# Patient Record
Sex: Female | Born: 1960 | Race: White | Hispanic: No | Smoking: Never smoker
Health system: Southern US, Community
[De-identification: ages and names within clinical notes are randomized; demographics above are authoritative.]

## PROBLEM LIST (undated history)

## (undated) DIAGNOSIS — N390 Urinary tract infection, site not specified: Secondary | ICD-10-CM

## (undated) DIAGNOSIS — I10 Essential (primary) hypertension: Secondary | ICD-10-CM

## (undated) DIAGNOSIS — A419 Sepsis, unspecified organism: Secondary | ICD-10-CM

## (undated) DIAGNOSIS — R002 Palpitations: Secondary | ICD-10-CM

## (undated) DIAGNOSIS — C801 Malignant (primary) neoplasm, unspecified: Secondary | ICD-10-CM

## (undated) DIAGNOSIS — E669 Obesity, unspecified: Secondary | ICD-10-CM

## (undated) DIAGNOSIS — E119 Type 2 diabetes mellitus without complications: Secondary | ICD-10-CM

## (undated) DIAGNOSIS — K625 Hemorrhage of anus and rectum: Secondary | ICD-10-CM

## (undated) DIAGNOSIS — M545 Low back pain: Secondary | ICD-10-CM

## (undated) DIAGNOSIS — E785 Hyperlipidemia, unspecified: Secondary | ICD-10-CM

## (undated) DIAGNOSIS — F32A Depression, unspecified: Secondary | ICD-10-CM

## (undated) DIAGNOSIS — R079 Chest pain, unspecified: Secondary | ICD-10-CM

## (undated) DIAGNOSIS — M75102 Unspecified rotator cuff tear or rupture of left shoulder, not specified as traumatic: Secondary | ICD-10-CM

## (undated) DIAGNOSIS — N200 Calculus of kidney: Secondary | ICD-10-CM

## (undated) DIAGNOSIS — M519 Unspecified thoracic, thoracolumbar and lumbosacral intervertebral disc disorder: Secondary | ICD-10-CM

## (undated) DIAGNOSIS — M79609 Pain in unspecified limb: Secondary | ICD-10-CM

## (undated) DIAGNOSIS — K219 Gastro-esophageal reflux disease without esophagitis: Secondary | ICD-10-CM

## (undated) DIAGNOSIS — K5790 Diverticulosis of intestine, part unspecified, without perforation or abscess without bleeding: Secondary | ICD-10-CM

## (undated) DIAGNOSIS — N3090 Cystitis, unspecified without hematuria: Secondary | ICD-10-CM

## (undated) DIAGNOSIS — M797 Fibromyalgia: Secondary | ICD-10-CM

## (undated) DIAGNOSIS — Z01810 Encounter for preprocedural cardiovascular examination: Secondary | ICD-10-CM

## (undated) DIAGNOSIS — F329 Major depressive disorder, single episode, unspecified: Secondary | ICD-10-CM

## (undated) DIAGNOSIS — F419 Anxiety disorder, unspecified: Secondary | ICD-10-CM

## (undated) HISTORY — DX: Essential (primary) hypertension: I10

## (undated) HISTORY — DX: Anxiety disorder, unspecified: F41.9

## (undated) HISTORY — DX: Chest pain, unspecified: R07.9

## (undated) HISTORY — DX: Urinary tract infection, site not specified: N39.0

## (undated) HISTORY — DX: Palpitations: R00.2

## (undated) HISTORY — DX: Unspecified rotator cuff tear or rupture of left shoulder, not specified as traumatic: M75.102

## (undated) HISTORY — DX: Low back pain: M54.5

## (undated) HISTORY — DX: Calculus of kidney: N20.0

## (undated) HISTORY — DX: Diverticulosis of intestine, part unspecified, without perforation or abscess without bleeding: K57.90

## (undated) HISTORY — DX: Pain in unspecified limb: M79.609

## (undated) HISTORY — PX: OTHER SURGICAL HISTORY: SHX169

## (undated) HISTORY — DX: Hemorrhage of anus and rectum: K62.5

## (undated) HISTORY — PX: TOTAL ABDOMINAL HYSTERECTOMY W/ BILATERAL SALPINGOOPHORECTOMY: SHX83

## (undated) HISTORY — PX: ABDOMINAL HYSTERECTOMY: SHX81

## (undated) HISTORY — DX: Hyperlipidemia, unspecified: E78.5

## (undated) HISTORY — PX: FOOT SURGERY: SHX648

## (undated) HISTORY — DX: Type 2 diabetes mellitus without complications: E11.9

## (undated) HISTORY — DX: Fibromyalgia: M79.7

## (undated) HISTORY — DX: Unspecified thoracic, thoracolumbar and lumbosacral intervertebral disc disorder: M51.9

## (undated) HISTORY — DX: Cystitis, unspecified without hematuria: N30.90

## (undated) HISTORY — DX: Obesity, unspecified: E66.9

## (undated) HISTORY — DX: Gastro-esophageal reflux disease without esophagitis: K21.9

## (undated) HISTORY — DX: Sepsis, unspecified organism: A41.9

## (undated) HISTORY — DX: Depression, unspecified: F32.A

## (undated) HISTORY — DX: Encounter for preprocedural cardiovascular examination: Z01.810

## (undated) HISTORY — DX: Major depressive disorder, single episode, unspecified: F32.9

---

## 1999-04-08 ENCOUNTER — Encounter: Admission: RE | Admit: 1999-04-08 | Discharge: 1999-04-15 | Payer: Self-pay | Admitting: Family Medicine

## 1999-05-16 ENCOUNTER — Ambulatory Visit (HOSPITAL_COMMUNITY): Admission: RE | Admit: 1999-05-16 | Discharge: 1999-05-16 | Payer: Self-pay | Admitting: Neurology

## 1999-05-16 ENCOUNTER — Encounter: Payer: Self-pay | Admitting: Neurology

## 1999-08-14 ENCOUNTER — Encounter: Admission: RE | Admit: 1999-08-14 | Discharge: 1999-08-30 | Payer: Self-pay | Admitting: Orthopedic Surgery

## 1999-10-29 ENCOUNTER — Encounter: Payer: Self-pay | Admitting: Sports Medicine

## 1999-10-29 ENCOUNTER — Ambulatory Visit (HOSPITAL_COMMUNITY): Admission: RE | Admit: 1999-10-29 | Discharge: 1999-10-29 | Payer: Self-pay | Admitting: Sports Medicine

## 2002-12-20 ENCOUNTER — Other Ambulatory Visit: Admission: RE | Admit: 2002-12-20 | Discharge: 2002-12-20 | Payer: Self-pay | Admitting: Family Medicine

## 2003-05-25 ENCOUNTER — Ambulatory Visit (HOSPITAL_COMMUNITY): Admission: RE | Admit: 2003-05-25 | Discharge: 2003-05-25 | Payer: Self-pay | Admitting: Neurosurgery

## 2004-10-10 ENCOUNTER — Ambulatory Visit: Payer: Self-pay | Admitting: Cardiology

## 2005-09-16 ENCOUNTER — Other Ambulatory Visit: Admission: RE | Admit: 2005-09-16 | Discharge: 2005-09-16 | Payer: Self-pay | Admitting: Family Medicine

## 2007-10-21 HISTORY — PX: ROTATOR CUFF REPAIR: SHX139

## 2010-01-17 ENCOUNTER — Encounter
Admission: RE | Admit: 2010-01-17 | Discharge: 2010-01-22 | Payer: Self-pay | Admitting: Physical Medicine & Rehabilitation

## 2010-01-22 ENCOUNTER — Ambulatory Visit: Payer: Self-pay | Admitting: Physical Medicine & Rehabilitation

## 2010-11-10 ENCOUNTER — Encounter: Payer: Self-pay | Admitting: Physical Medicine & Rehabilitation

## 2011-01-04 DIAGNOSIS — C539 Malignant neoplasm of cervix uteri, unspecified: Secondary | ICD-10-CM

## 2011-01-04 DIAGNOSIS — I10 Essential (primary) hypertension: Secondary | ICD-10-CM

## 2011-01-04 DIAGNOSIS — E1169 Type 2 diabetes mellitus with other specified complication: Secondary | ICD-10-CM | POA: Insufficient documentation

## 2011-01-04 DIAGNOSIS — G8929 Other chronic pain: Secondary | ICD-10-CM | POA: Insufficient documentation

## 2011-01-04 DIAGNOSIS — R002 Palpitations: Secondary | ICD-10-CM | POA: Insufficient documentation

## 2011-01-04 DIAGNOSIS — K5750 Diverticulosis of both small and large intestine without perforation or abscess without bleeding: Secondary | ICD-10-CM | POA: Insufficient documentation

## 2011-01-04 DIAGNOSIS — E785 Hyperlipidemia, unspecified: Secondary | ICD-10-CM

## 2011-01-04 DIAGNOSIS — E119 Type 2 diabetes mellitus without complications: Secondary | ICD-10-CM | POA: Insufficient documentation

## 2011-01-04 DIAGNOSIS — G2581 Restless legs syndrome: Secondary | ICD-10-CM | POA: Insufficient documentation

## 2011-01-04 HISTORY — DX: Malignant neoplasm of cervix uteri, unspecified: C53.9

## 2011-01-04 HISTORY — DX: Essential (primary) hypertension: I10

## 2011-02-06 ENCOUNTER — Encounter: Payer: Self-pay | Admitting: Physician Assistant

## 2011-02-11 ENCOUNTER — Encounter: Payer: Self-pay | Admitting: Physician Assistant

## 2011-02-12 ENCOUNTER — Ambulatory Visit (INDEPENDENT_AMBULATORY_CARE_PROVIDER_SITE_OTHER): Payer: Managed Care, Other (non HMO) | Admitting: Physician Assistant

## 2011-02-12 ENCOUNTER — Ambulatory Visit: Payer: Self-pay | Admitting: Physician Assistant

## 2011-02-12 ENCOUNTER — Other Ambulatory Visit: Payer: Self-pay | Admitting: Internal Medicine

## 2011-02-12 ENCOUNTER — Encounter: Payer: Self-pay | Admitting: Internal Medicine

## 2011-02-12 ENCOUNTER — Encounter: Payer: Self-pay | Admitting: Physician Assistant

## 2011-02-12 ENCOUNTER — Telehealth: Payer: Self-pay | Admitting: Physician Assistant

## 2011-02-12 VITALS — BP 136/88 | HR 113 | Ht 64.0 in | Wt 225.0 lb

## 2011-02-12 DIAGNOSIS — R079 Chest pain, unspecified: Secondary | ICD-10-CM

## 2011-02-12 DIAGNOSIS — R55 Syncope and collapse: Secondary | ICD-10-CM

## 2011-02-12 DIAGNOSIS — Z01810 Encounter for preprocedural cardiovascular examination: Secondary | ICD-10-CM

## 2011-02-12 HISTORY — DX: Encounter for preprocedural cardiovascular examination: Z01.810

## 2011-02-12 HISTORY — DX: Chest pain, unspecified: R07.9

## 2011-02-12 LAB — BASIC METABOLIC PANEL
BUN: 23 mg/dL (ref 6–23)
CO2: 29 mEq/L (ref 19–32)
Calcium: 10.3 mg/dL (ref 8.4–10.5)
Glucose, Bld: 155 mg/dL — ABNORMAL HIGH (ref 70–99)
Sodium: 144 mEq/L (ref 135–145)

## 2011-02-12 MED ORDER — FAMOTIDINE 20 MG PO TABS: 20.0000 mg | ORAL_TABLET | Freq: Two times a day (BID) | ORAL | Status: DC

## 2011-02-12 NOTE — Assessment & Plan Note (Signed)
Atypical and typical features.  Schedule Myoview as noted above.  She has some symptoms of dysphagia as well.  I suspect her symptoms may be from GERD or possibly asthma.  She can take Pepcid 20 mg BID and follow up with her PCP for further evaluation.  She is fairly sedentary.  She has had some episodes of syncope.  Her HR is high today as well.  She will have a DDimer drawn today as I think she is at risk for pulmonary embolism.  She will need a CT if this is abnormal.

## 2011-02-12 NOTE — Assessment & Plan Note (Signed)
She needs a urologic procedure done sometime in the near future.  I reviewed her ECG from her PCPs office as well as her ECG here and her prior ECGs.  There does not seem to be a significant change over time.  However, she does have symptoms of chest pain.  Prior to clearing her for surgery, she will need Stress testing.  She will be set up for a [employer] study.  If this is normal, she will require no further testing.

## 2011-02-12 NOTE — Telephone Encounter (Signed)
DDimer negative. Not sure who results went to . . . I cannot access it to comment. Please notify patient. Tereso Newcomer, PA-C

## 2011-02-12 NOTE — Progress Notes (Signed)
History of Present Illness: Primary Cardiologist:  Dr. Rollene Rotunda  Casey Sanders is a 50 y.o. female with a h/o DM2, HTN, HLP who presents for surgical clearance.  She was evaluated By Dr. Antoine Poche in 2005 dyspnea.  A Myoview study was negative for ischemia.  She had normal LV function.  She had a normal BNP.  Holter monitor demonstrated sinus rhythm sinus tachycardia and PVCs.  She is referred back today for surgical clearance due to an abnormal electrocardiogram.  The patient has significant issues with pain.  She is seen by a pain specialist.  She is not that active and in fact she spends most of the day in her bed.  She is unable to vacuum a room or go up steps or a hill.  She does have chest pain.  This is a left-sided sharp pain.  It comes on at rest.  She notes associated shortness of breath.  She has shortness of breath with any type of activity.  This is a chronic symptom.  She does note some association with meals.  She does note some dysphagia.  She notes a history of syncope.  She apparently passed out several months ago.  This happened again about 2 months ago.  It sounds as though she may have had an asthma exacerbation at the time.  It also sounds as though she had some orthostasis.  She sleeps on her side because of her back pain.  She does occasionally awaken short of breath.  She denies significant pedal edema.  She apparently has a kidney stone.  This is causing an obstruction which is resulting in frequent urinary tract infections.  She is to have a procedure soon with Dr. Annabell Howells.   Past Medical History  Diagnosis Date  . Palpitations     monitor 9/05: NSR, sinus tachy, PVCs  . Chest pain     a. Myoview 9/05: no ischemia, no scar, EF 70%  . DM2 (diabetes mellitus, type 2)   . HLD (hyperlipidemia)   . HTN (hypertension)   . GERD (gastroesophageal reflux disease)     Hiatal Hernia  . Asthma   . Diverticulosis   . Obesity   . Lumbar disc disease   . Anxiety and depression     . Allergic rhinitis   . Nephrolithiasis   . Frequent UTI   . Left rotator cuff tear   . Fibromyalgia     Past Surgical History  Procedure Date  . Total abdominal hysterectomy w/ bilateral salpingoophorectomy   . Cesarean section   . Foot surgery   . Tonsillectomy   . Right wrist surgery     Current Outpatient Prescriptions  Medication Sig Dispense Refill  . albuterol (PROVENTIL HFA;VENTOLIN HFA) 108 (90 BASE) MCG/ACT inhaler Inhale 2 puffs into the lungs. 2puffs q 8 hr      . aspirin 81 MG EC tablet Take 81 mg by mouth daily.        . clonazePAM (KLONOPIN) 2 MG tablet Take 2 mg by mouth 5 (five) times daily.        . cyclobenzaprine (FLEXERIL) 10 MG tablet Take 10 mg by mouth as directed.        Marland Kitchen exenatide (BYETTA) 10 MCG/0.04ML SOLN Inject into the skin 2 (two) times daily with a meal.        . Fenofibrate (LIPOFEN) 150 MG CAPS Take by mouth daily.        . fentaNYL (DURAGESIC - DOSED MCG/HR) 12 MCG/HR Place 1  patch onto the skin every other day.       . fexofenadine (ALLEGRA) 60 MG tablet Take 60 mg by mouth 2 (two) times daily.        . fish oil-omega-3 fatty acids 1000 MG capsule Take 2 g by mouth daily.        . fluticasone (FLONASE) 50 MCG/ACT nasal spray 2 sprays by Nasal route daily. 1 spray each nostril qd       . Fluticasone-Salmeterol (ADVAIR DISKUS) 250-50 MCG/DOSE AEPB Inhale 1 puff into the lungs every 12 (twelve) hours.        . gabapentin (NEURONTIN) 800 MG tablet Take 800 mg by mouth 4 (four) times daily.        Marland Kitchen glimepiride (AMARYL) 2 MG tablet Take 2 mg by mouth daily before breakfast. 1 in am  And  2 at pm       . lisinopril (PRINIVIL,ZESTRIL) 10 MG tablet Take 10 mg by mouth daily.        . Multiple Vitamin (MULTIVITAMIN PO) Take by mouth daily.        . nitrofurantoin (MACRODANTIN) 100 MG capsule Take 100 mg by mouth daily.        . rosuvastatin (CRESTOR) 20 MG tablet Take 20 mg by mouth daily.        Marland Kitchen DISCONTD: DULoxetine (CYMBALTA) 60 MG capsule Take 60  mg by mouth. 2 caps at bedtime       . lidocaine (LIDODERM) 5 % Place 1 patch onto the skin daily. Remove & Discard patch within 12 hours or as directed by MD       . DISCONTD: etodolac (LODINE) 400 MG tablet Take 400 mg by mouth 2 (two) times daily.        Marland Kitchen DISCONTD: fenofibrate micronized (ANTARA) 130 MG capsule Take 130 mg by mouth daily before breakfast.        . DISCONTD: metFORMIN (GLUMETZA) 500 MG (MOD) 24 hr tablet Take 500 mg by mouth 2 (two) times daily after a meal.        . DISCONTD: montelukast (SINGULAIR) 10 MG tablet Take 10 mg by mouth at bedtime.          Allergies  Allergen Reactions  . Codeine Other (See Comments)    unknown  . Niaspan (Niacin (Antihyperlipidemic)) Other (See Comments)    Increase blood glucose    History  Substance Use Topics  . Smoking status: Former Games developer  . Smokeless tobacco: Not on file  . Alcohol Use: No     Family History  Problem Relation Age of Onset  . Coronary artery disease Mother   . Coronary artery disease Father     ROS:  See the history of present illness.  She does have some fever when she has a urinary tract infection.  She does have a cough with her asthma.  She has occasional bright red blood per rectum secondary to hemorrhoids.  She has lost weight recently.  This has been worked up by her PCP.  She denies skin changes.  All other systems reviewed and negative.  Vital Signs: BP 136/88  Pulse 113  Ht 5\' 4"  (1.626 m)  Wt 225 lb (102.059 kg)  BMI 38.62 kg/m2  PHYSICAL EXAM: Well nourished, well developed, in no acute distress HEENT: normal Neck: no JVD Vascular: No carotid bruits Endocrine: No thyromegaly Lymphatics: No cervical adenopathy Cardiac:  normal S1, S2; RRR; no murmur, Distant heart sounds Lungs:  Decreased breath sounds bilaterally, no  wheezing, no rales Abd: soft, nontender, no hepatomegaly Ext: no edema Skin: warm and dry Neuro:  CNs 2-12 intact, no focal abnormalities noted Psych: Flat  affect  EKG:  Sinus tachycardia, Heart rate 113, and rightward axis, low voltage, poor R-wave progression, no ischemic changes.  ASSESSMENT AND PLAN:

## 2011-02-12 NOTE — Progress Notes (Signed)
Addended byTereso Newcomer on: 02/12/2011 02:01 PM   Modules accepted: Level of Service

## 2011-02-12 NOTE — Patient Instructions (Signed)
Your physician has requested that you have a lexiscan myoview 786.50 THIS WILL NEED TO BE A 2 DAY PROTOCOL. For further information please visit [email or web address] Please follow instruction sheet, as given.   Your physician recommends that you return for lab work in: TODAY STAT D-DIMER 786.50, AND A BMET 786.50  Your physician has recommended you make the following change in your medication: START PEPCID 20 MG 1 TAB TWICE DAILY; PLEASE FOLLOW UP WITH YOUR PRIMARY CARE PHYSICIAN IN REFERENCE TO THE CHEST PAIN AND EPIGASTRIC PAIN.

## 2011-02-18 ENCOUNTER — Other Ambulatory Visit (HOSPITAL_COMMUNITY): Payer: 59 | Admitting: Radiology

## 2011-02-19 ENCOUNTER — Ambulatory Visit (HOSPITAL_COMMUNITY): Payer: Managed Care, Other (non HMO) | Attending: Cardiology | Admitting: Radiology

## 2011-02-19 DIAGNOSIS — R079 Chest pain, unspecified: Secondary | ICD-10-CM | POA: Insufficient documentation

## 2011-02-19 DIAGNOSIS — E119 Type 2 diabetes mellitus without complications: Secondary | ICD-10-CM

## 2011-02-19 DIAGNOSIS — R0602 Shortness of breath: Secondary | ICD-10-CM

## 2011-02-19 DIAGNOSIS — Z01810 Encounter for preprocedural cardiovascular examination: Secondary | ICD-10-CM | POA: Insufficient documentation

## 2011-02-19 DIAGNOSIS — R55 Syncope and collapse: Secondary | ICD-10-CM

## 2011-02-19 MED ORDER — REGADENOSON 0.4 MG/5ML IV SOLN
0.4000 mg | Freq: Once | INTRAVENOUS | Status: AC
  Administered 2011-02-19: 0.4 mg via INTRAVENOUS

## 2011-02-19 MED ORDER — TECHNETIUM TC 99M TETROFOSMIN IV KIT
33.0000 | PACK | Freq: Once | INTRAVENOUS | Status: AC | PRN
  Administered 2011-02-19: 33 via INTRAVENOUS

## 2011-02-19 MED ORDER — TECHNETIUM TC 99M TETROFOSMIN IV KIT
10.1000 | PACK | Freq: Once | INTRAVENOUS | Status: AC | PRN
  Administered 2011-02-19: 10.1 via INTRAVENOUS

## 2011-02-19 NOTE — Progress Notes (Addendum)
[facility] SITE 3 NUCLEAR MED [address] [place] [address] [phone]  Cardiology Nuclear Med Study  Casey Sanders is a 50 y.o. female [id number] June 29, 1961   Nuclear Med Background Indication for Stress Test:  Evaluation for Ischemia, Pending Surgical Clearance: Dr. Annabell Howells, and Abnormal EKG History:  Asthma and '05 Myocardial Perfusion Study NL EF 70% Cardiac Risk Factors: Family History - CAD, History of Smoking, Hypertension, Lipids, NIDDM and Obesity  Symptoms:  Chest Pain   Nuclear Pre-Procedure Caffeine/Decaff Intake:  None NPO After: 7:00am   Lungs:  clear IV 0.9% NS with Angio Cath:  22g  IV Site: R Antecubital  IV Started by:  Stanton Kidney, EMT-P  Chest Size (in):  40 Cup Size: DD  Height: 5\' 1"  (1.549 m)  Weight:  224 lb (101.606 kg)  BMI:  Body mass index is 42.32 kg/(m^2). Tech Comments:  CBG=165 @ 7 am today    Nuclear Med Study 1 or 2 day study: 1 day  Stress Test Type:  Eugenie Birks  Reading MD: Marca Ancona, MD  Order Authorizing Provider:  J.Hochrein  Resting Radionuclide: Technetium 42m Tetrofosmin  Resting Radionuclide Dose: 10.1 mCi   Stress Radionuclide:  Technetium 77m Tetrofosmin  Stress Radionuclide Dose: 33 mCi           Stress Protocol Rest HR: 102 Stress HR: 120  Rest BP: 99/56 Stress BP: 102/58  Exercise Time (min): n/a METS: n/a   Predicted Max HR: 169 bpm % Max HR: 71.01 bpm Rate Pressure Product: 57846   Dose of Adenosine (mg):  n/a Dose of Lexiscan: 0.4 mg  Dose of Atropine (mg): n/a Dose of Dobutamine: n/a mcg/kg/min (at max HR)  Stress Test Technologist: Milana Na, EMT-P  Nuclear Technologist:  Domenic Polite, CNMT     Rest Procedure:  Myocardial perfusion imaging was performed at rest 45 minutes following the intravenous administration of Technetium 48m Tetrofosmin. Rest ECG: Sinus Tachycardia  Stress Procedure:  The patient received IV Lexiscan 0.4 mg over 15-seconds.  Technetium 29m Tetrofosmin  injected at 30-seconds.  There were no significant changes with Lexiscan.  Quantitative spect images were obtained after a 45 minute delay. Stress ECG: No significant change from baseline ECG  QPS Raw Data Images:  Normal; no motion artifact; normal heart/lung ratio. Stress Images:  Normal homogeneous uptake in all areas of the myocardium. Rest Images:  Normal homogeneous uptake in all areas of the myocardium. Subtraction (SDS):  No evidence of ischemia. Transient Ischemic Dilatation (Normal <1.22):  1.06 Lung/Heart Ratio (Normal <0.45):  .43  Quantitative Gated Spect Images QGS EDV:  56 ml QGS ESV:  18 ml QGS cine images:  NL LV Function; NL Wall Motion QGS EF: 67%  Impression Exercise Capacity:  Lexiscan with no exercise. BP Response:  Hypotensive blood pressure response. Clinical Symptoms:  Chest pain ECG Impression:  No significant ST segment change suggestive of ischemia. Comparison with Prior Nuclear Study: No significant change from previous study  Overall Impression:  Normal stress nuclear study.  Casey Sanders  Normal.  Also reviewed by Tereso Newcomer PAc.  Documentation in chart that patient is at acceptable risk for surgery and I agree.  Information was to be faxed to the requesting MD and the patient notified.  Rollene Rotunda

## 2011-02-20 ENCOUNTER — Telehealth: Payer: Self-pay | Admitting: Physician Assistant

## 2011-02-20 NOTE — Progress Notes (Signed)
ROUTED TO DR.HOCHREIN.Falecha Clark °

## 2011-02-20 NOTE — Telephone Encounter (Signed)
Please inform patient that her stress test is normal. She can proceed with her surgery.  No further testing is needed and she should be at acceptable risk. Fax notes and stress test to surgeon and her PCP. Tereso Newcomer, PA-C

## 2011-02-27 ENCOUNTER — Ambulatory Visit (HOSPITAL_BASED_OUTPATIENT_CLINIC_OR_DEPARTMENT_OTHER)
Admission: RE | Admit: 2011-02-27 | Discharge: 2011-02-27 | Disposition: A | Discharge status: Home / Self Care | Payer: Managed Care, Other (non HMO) | Source: Ambulatory Visit | Attending: Urology | Admitting: Urology

## 2011-02-27 DIAGNOSIS — G589 Mononeuropathy, unspecified: Secondary | ICD-10-CM | POA: Insufficient documentation

## 2011-02-27 DIAGNOSIS — J45909 Unspecified asthma, uncomplicated: Secondary | ICD-10-CM | POA: Insufficient documentation

## 2011-02-27 DIAGNOSIS — N3020 Other chronic cystitis without hematuria: Secondary | ICD-10-CM | POA: Insufficient documentation

## 2011-02-27 DIAGNOSIS — K219 Gastro-esophageal reflux disease without esophagitis: Secondary | ICD-10-CM | POA: Insufficient documentation

## 2011-02-27 DIAGNOSIS — Z01812 Encounter for preprocedural laboratory examination: Secondary | ICD-10-CM | POA: Insufficient documentation

## 2011-02-27 DIAGNOSIS — E119 Type 2 diabetes mellitus without complications: Secondary | ICD-10-CM | POA: Insufficient documentation

## 2011-02-27 DIAGNOSIS — Z79899 Other long term (current) drug therapy: Secondary | ICD-10-CM | POA: Insufficient documentation

## 2011-02-27 DIAGNOSIS — I10 Essential (primary) hypertension: Secondary | ICD-10-CM | POA: Insufficient documentation

## 2011-02-27 DIAGNOSIS — N201 Calculus of ureter: Secondary | ICD-10-CM | POA: Insufficient documentation

## 2011-02-27 DIAGNOSIS — F341 Dysthymic disorder: Secondary | ICD-10-CM | POA: Insufficient documentation

## 2011-02-27 LAB — POCT I-STAT, CHEM 8
BUN: 16 mg/dL (ref 6–23)
Chloride: 105 mEq/L (ref 96–112)
Creatinine, Ser: 0.9 mg/dL (ref 0.4–1.2)
Glucose, Bld: 198 mg/dL — ABNORMAL HIGH (ref 70–99)
Potassium: 3.7 mEq/L (ref 3.5–5.1)

## 2011-03-06 NOTE — Op Note (Signed)
  NAMEPRISCILLE, Casey Sanders NO.:  [id number]  MEDICAL RECORD NO.:  [id number]           PATIENT TYPE:  O  LOCATION:  ST3NUCME                     FACILITY:  [facility]  PHYSICIAN:  Excell Seltzer. Annabell Howells, M.D.    DATE OF BIRTH:  1961/01/04  DATE OF PROCEDURE:  02/27/2011 DATE OF DISCHARGE:  02/19/2011                              OPERATIVE REPORT   PROCEDURE:  Cystoscopy, right retrograde pyelogram.  PREOPERATIVE DIAGNOSIS:  Right ureteral stone and chronic cystitis.  POSTOPERATIVE DIAGNOSIS:  Right ureteral stone and chronic cystitis with interval passage of the stone.  SURGEON:  Excell Seltzer. Annabell Howells, M.D.  ANESTHESIA:  General.  SPECIMEN:  None.  DRAINS:  None.  COMPLICATIONS:  None.  INDICATIONS:  [name] is a 50 year old white female who was initially seen for recurrent UTIs and hematuria.  Office cystoscopy revealed some patchy erythema on the posterior wall of the bladder that was most consistent with inflammatory changes.  A CT scan was obtained, which revealed a 5-mm nonobstructing right midureteral stone.  It was felt that cystoscopy with retrograde pyelogram and possible ureteroscopy was indicated.  She remains on nitrofurantoin for suppression.  FINDINGS AND PROCEDURE:  She was given Cipro and taken to the operating room where general anesthetic was induced.  She was placed in the lithotomy position.  Her perineum and genitalia were prepped with Betadine solution and she was draped in the usual sterile fashion. Cystoscopy was performed using the 22-French scope and 12-degrees lens. Examination revealed slight tightness of the urethral meatus, but the 22 sheath could pass without significant difficulty.  Inspection of the bladder wall revealed mild trabeculation.  The mucosa was generally normal in appearance.  On the posterior wall there was some increased vascularity, but nothing suspicious for neoplasm.  It appeared more consistent with either irritation from  the scope or possibly resolving inflammation.  There was a tiny pink dot on the dome, which appeared consistent with a lymphoid follicle.  No edema or any other findings suggestive of a enterovesical fistula was indicated.  Ureteral orifices were unremarkable, in the normal anatomic position, effluxing clear urine.  The right ureteral orifice was cannulated with a 5-French open-ended catheter and contrast was instilled.  Right retrograde pyelogram demonstrated a normal ureter and internal collecting system without filling defects.  Upon removal of the catheter, there was prompt drainage of the contrast into the bladder.  At this point, the patient was taken down from the lithotomy position. Her anesthetic was reversed.  She was moved to the recovery room in stable condition.  There were no complications.     Excell Seltzer. Annabell Howells, M.D.     JJW/MEDQ  D:  02/27/2011  T:  02/27/2011  Job:  [id number]  cc:   Kyra Manges, M.D.  Electronically Signed by Bjorn Pippin M.D. on 03/06/2011 10:50:48 AM

## 2011-06-06 ENCOUNTER — Encounter: Payer: Self-pay | Admitting: Gastroenterology

## 2011-08-11 ENCOUNTER — Ambulatory Visit: Payer: Managed Care, Other (non HMO) | Attending: Orthopedic Surgery | Admitting: Physical Therapy

## 2011-08-11 DIAGNOSIS — M25519 Pain in unspecified shoulder: Secondary | ICD-10-CM | POA: Insufficient documentation

## 2011-08-11 DIAGNOSIS — R5381 Other malaise: Secondary | ICD-10-CM | POA: Insufficient documentation

## 2011-08-11 DIAGNOSIS — M25619 Stiffness of unspecified shoulder, not elsewhere classified: Secondary | ICD-10-CM | POA: Insufficient documentation

## 2011-08-11 DIAGNOSIS — IMO0001 Reserved for inherently not codable concepts without codable children: Secondary | ICD-10-CM | POA: Insufficient documentation

## 2011-08-14 ENCOUNTER — Ambulatory Visit: Payer: Managed Care, Other (non HMO) | Admitting: Physical Therapy

## 2011-08-18 ENCOUNTER — Ambulatory Visit: Payer: Managed Care, Other (non HMO) | Admitting: Physical Therapy

## 2011-08-21 ENCOUNTER — Ambulatory Visit: Payer: Managed Care, Other (non HMO) | Attending: Orthopedic Surgery | Admitting: *Deleted

## 2011-08-21 DIAGNOSIS — M25619 Stiffness of unspecified shoulder, not elsewhere classified: Secondary | ICD-10-CM | POA: Insufficient documentation

## 2011-08-21 DIAGNOSIS — M25519 Pain in unspecified shoulder: Secondary | ICD-10-CM | POA: Insufficient documentation

## 2011-08-21 DIAGNOSIS — R5381 Other malaise: Secondary | ICD-10-CM | POA: Insufficient documentation

## 2011-08-21 DIAGNOSIS — IMO0001 Reserved for inherently not codable concepts without codable children: Secondary | ICD-10-CM | POA: Insufficient documentation

## 2011-08-25 ENCOUNTER — Ambulatory Visit: Payer: Managed Care, Other (non HMO) | Admitting: Physical Therapy

## 2011-08-28 ENCOUNTER — Ambulatory Visit: Payer: Managed Care, Other (non HMO) | Admitting: Physical Therapy

## 2011-09-01 ENCOUNTER — Encounter: Payer: Managed Care, Other (non HMO) | Admitting: Physical Therapy

## 2011-09-04 ENCOUNTER — Ambulatory Visit: Payer: Managed Care, Other (non HMO) | Admitting: Physical Therapy

## 2011-09-09 ENCOUNTER — Ambulatory Visit: Payer: Managed Care, Other (non HMO) | Admitting: Physical Therapy

## 2011-09-15 ENCOUNTER — Ambulatory Visit: Payer: Managed Care, Other (non HMO) | Admitting: Physical Therapy

## 2011-09-18 ENCOUNTER — Ambulatory Visit: Payer: Managed Care, Other (non HMO) | Admitting: Physical Therapy

## 2011-09-22 ENCOUNTER — Ambulatory Visit: Payer: Managed Care, Other (non HMO) | Attending: Orthopedic Surgery | Admitting: Physical Therapy

## 2011-09-22 DIAGNOSIS — R5381 Other malaise: Secondary | ICD-10-CM | POA: Insufficient documentation

## 2011-09-22 DIAGNOSIS — IMO0001 Reserved for inherently not codable concepts without codable children: Secondary | ICD-10-CM | POA: Insufficient documentation

## 2011-09-22 DIAGNOSIS — M25619 Stiffness of unspecified shoulder, not elsewhere classified: Secondary | ICD-10-CM | POA: Insufficient documentation

## 2011-09-22 DIAGNOSIS — M25519 Pain in unspecified shoulder: Secondary | ICD-10-CM | POA: Insufficient documentation

## 2011-09-25 ENCOUNTER — Ambulatory Visit: Payer: Managed Care, Other (non HMO) | Admitting: Physical Therapy

## 2011-09-30 ENCOUNTER — Ambulatory Visit: Payer: Managed Care, Other (non HMO) | Admitting: Physical Therapy

## 2011-10-01 ENCOUNTER — Encounter: Payer: Managed Care, Other (non HMO) | Admitting: Physical Therapy

## 2011-10-06 ENCOUNTER — Ambulatory Visit: Payer: Managed Care, Other (non HMO) | Admitting: Physical Therapy

## 2011-10-09 ENCOUNTER — Ambulatory Visit: Payer: Managed Care, Other (non HMO) | Admitting: Physical Therapy

## 2011-10-15 ENCOUNTER — Ambulatory Visit: Payer: Managed Care, Other (non HMO) | Admitting: Physical Therapy

## 2011-10-17 ENCOUNTER — Ambulatory Visit: Payer: Managed Care, Other (non HMO) | Admitting: Physical Therapy

## 2012-02-24 ENCOUNTER — Other Ambulatory Visit: Payer: Self-pay | Admitting: Physician Assistant

## 2012-07-19 ENCOUNTER — Encounter: Payer: Self-pay | Admitting: Gastroenterology

## 2012-07-22 ENCOUNTER — Other Ambulatory Visit: Payer: Self-pay | Admitting: *Deleted

## 2012-07-22 MED ORDER — FAMOTIDINE 20 MG PO TABS: 20.0000 mg | ORAL_TABLET | Freq: Two times a day (BID) | ORAL | Status: DC

## 2012-09-20 IMAGING — CR DG TIBIA/FIBULA 2V*L*
2 series · 2 of 2 positions shown · non-contrast
Comparison: None.

CLINICAL DATA: No injury.  Leg swelling.

LEFT TIBIA AND FIBULA - 2 VIEW

[view not recorded (1 of 2)]
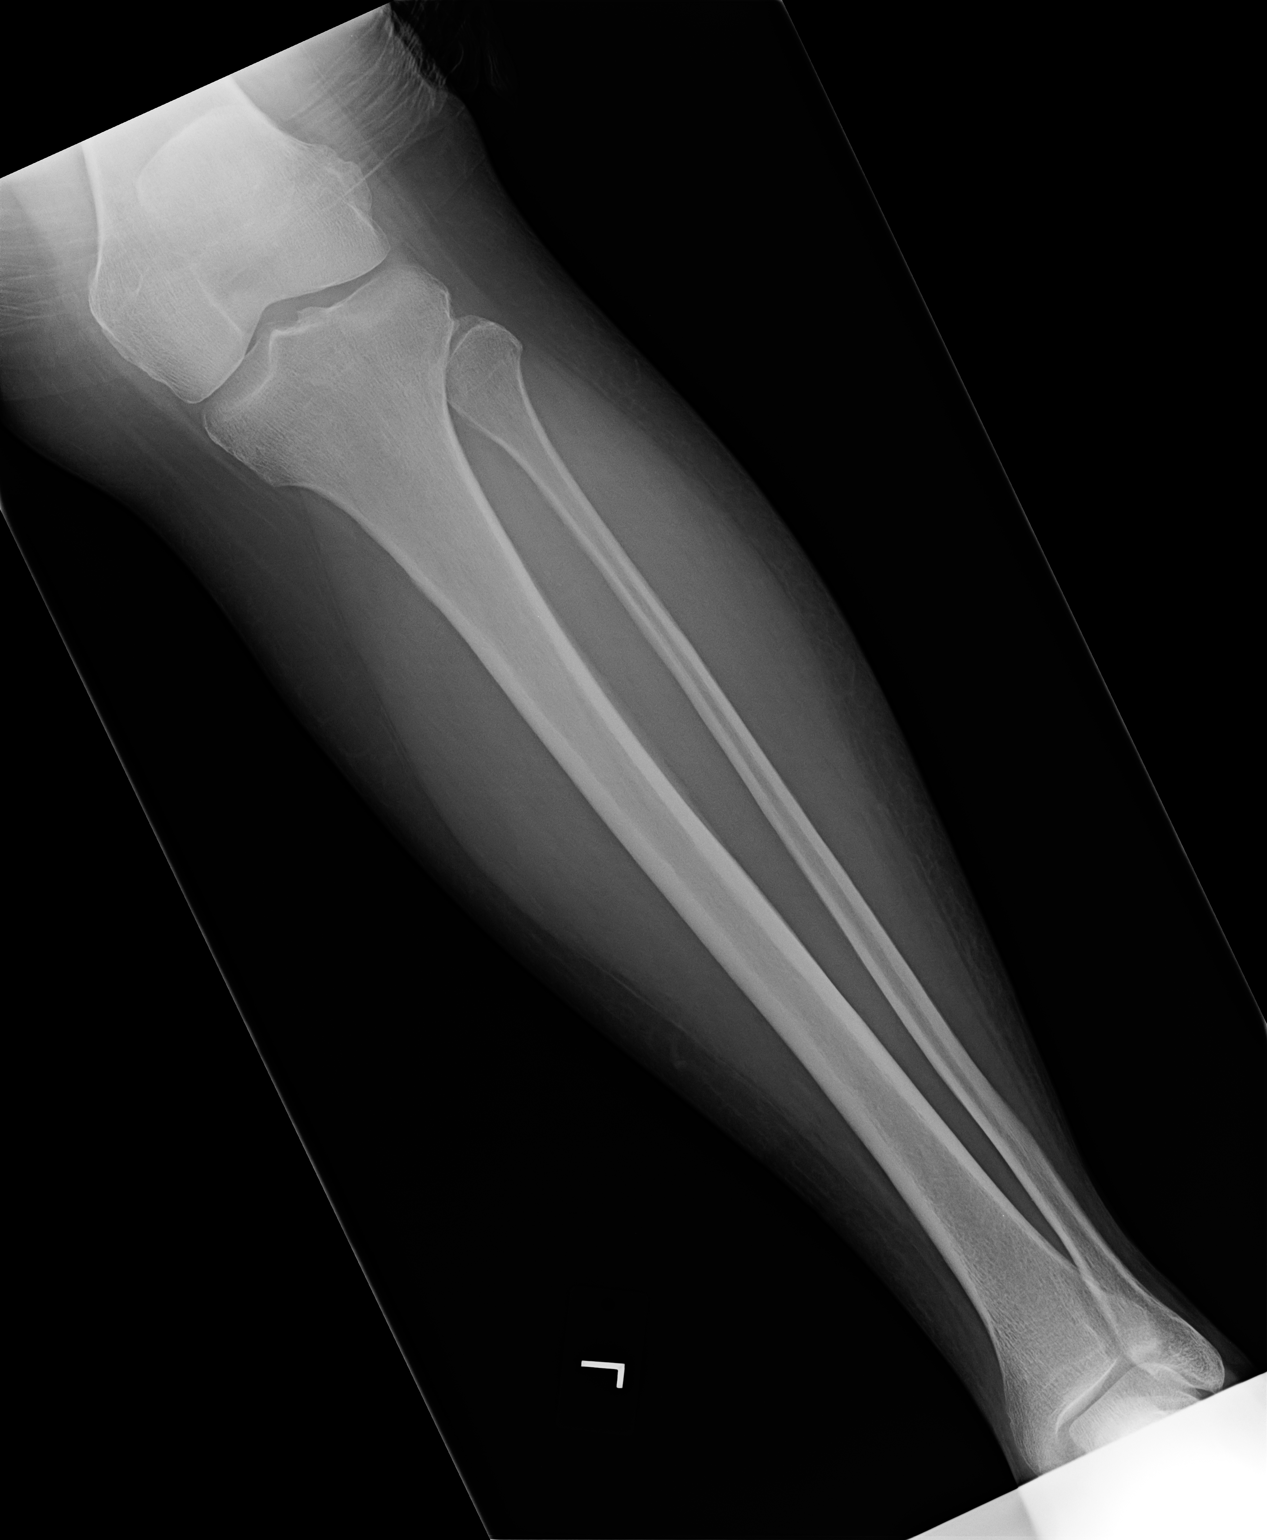

[view not recorded (2 of 2)]
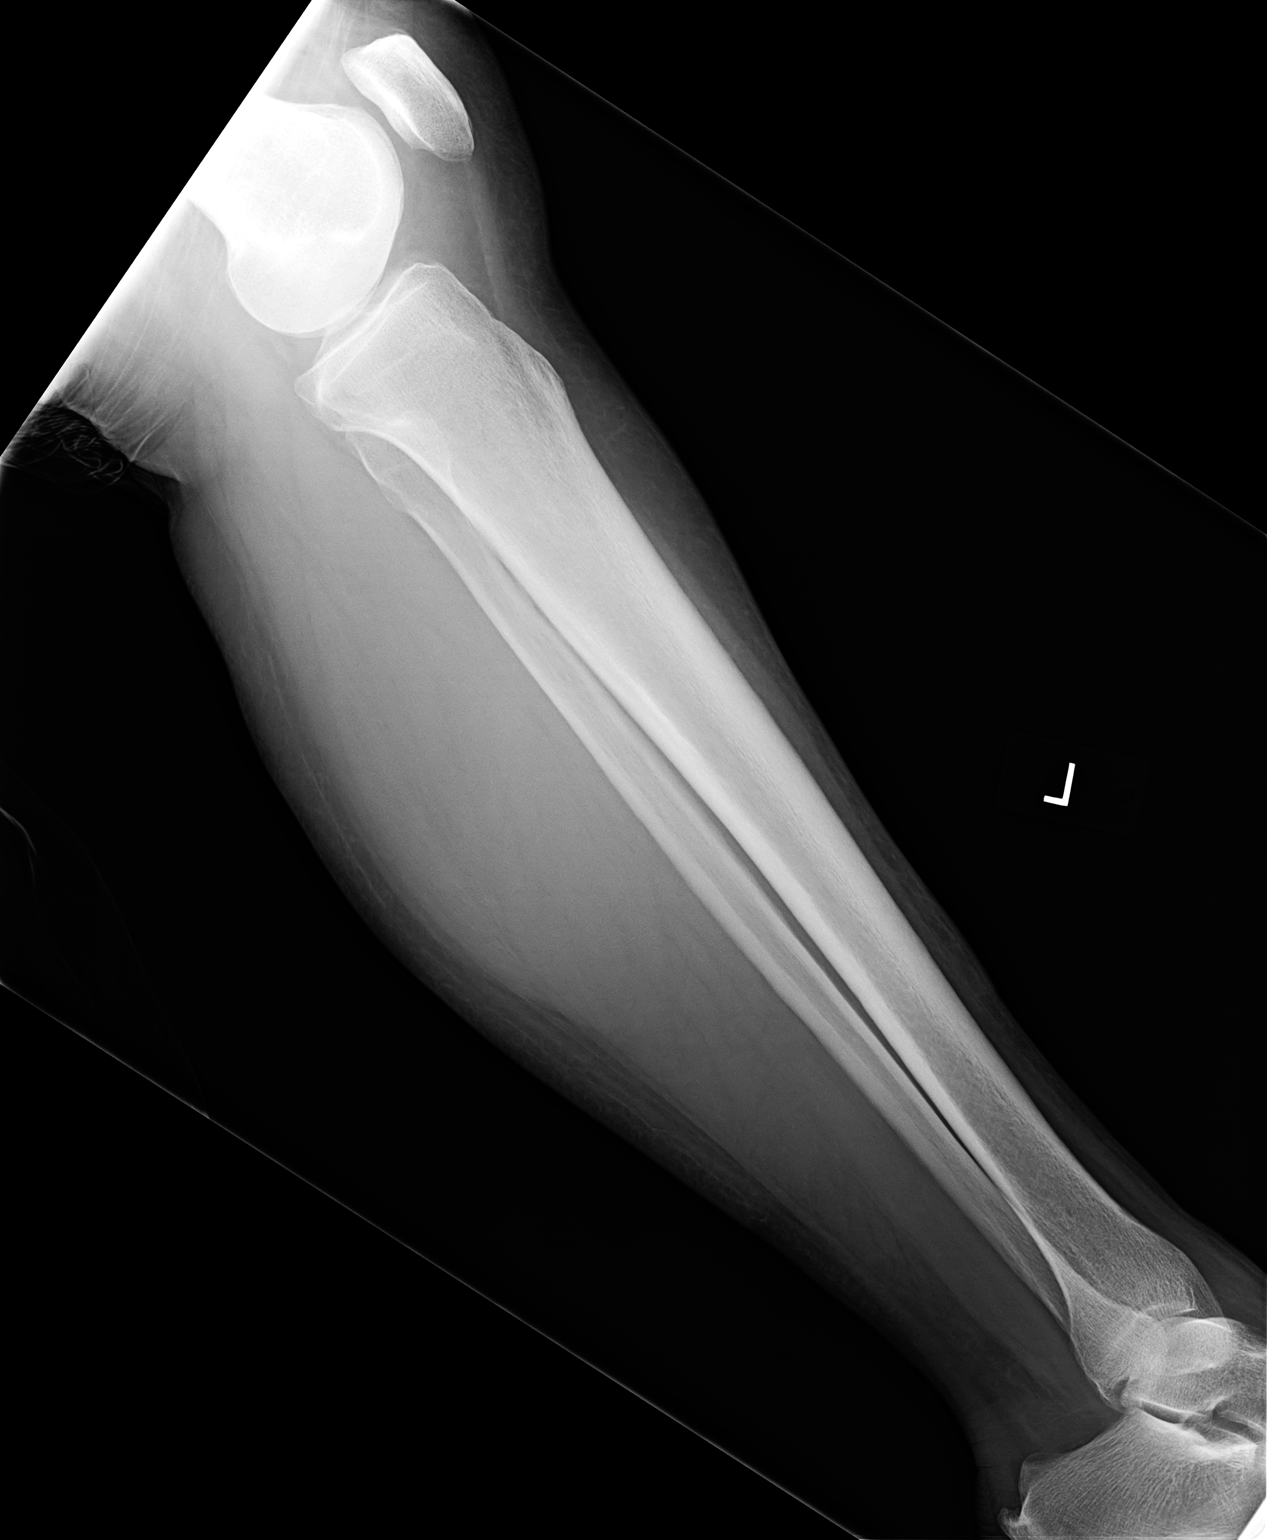

[2 of 2 positions shown; findings below may reference images not displayed]

FINDINGS: Mild infiltration of the subcutaneous tissues is present
in the lateral leg.  No radiopaque foreign body.  Tibia and fibula
appear within normal limits.
IMPRESSION: Nonspecific mild subcutaneous infiltration in the leg, most
commonly representing edema or cellulitis in the appropriate
clinical setting.

## 2013-01-04 DIAGNOSIS — G894 Chronic pain syndrome: Secondary | ICD-10-CM | POA: Insufficient documentation

## 2013-01-04 DIAGNOSIS — M51369 Other intervertebral disc degeneration, lumbar region without mention of lumbar back pain or lower extremity pain: Secondary | ICD-10-CM | POA: Insufficient documentation

## 2013-01-04 DIAGNOSIS — M545 Low back pain, unspecified: Secondary | ICD-10-CM

## 2013-01-04 DIAGNOSIS — M47816 Spondylosis without myelopathy or radiculopathy, lumbar region: Secondary | ICD-10-CM | POA: Insufficient documentation

## 2013-01-04 DIAGNOSIS — M79609 Pain in unspecified limb: Secondary | ICD-10-CM

## 2013-01-04 DIAGNOSIS — M5136 Other intervertebral disc degeneration, lumbar region: Secondary | ICD-10-CM | POA: Insufficient documentation

## 2013-01-04 HISTORY — DX: Pain in unspecified limb: M79.609

## 2013-01-04 HISTORY — DX: Low back pain, unspecified: M54.50

## 2013-01-26 ENCOUNTER — Telehealth: Payer: Self-pay | Admitting: Family Medicine

## 2013-01-27 ENCOUNTER — Telehealth: Payer: Self-pay | Admitting: Pharmacist

## 2013-01-27 DIAGNOSIS — E1169 Type 2 diabetes mellitus with other specified complication: Secondary | ICD-10-CM

## 2013-01-27 MED ORDER — FENOFIBRATE 150 MG PO CAPS: 1.0000 | ORAL_CAPSULE | Freq: Every day | ORAL | Status: DC

## 2013-01-27 MED ORDER — ROSUVASTATIN CALCIUM 20 MG PO TABS: 20.0000 mg | ORAL_TABLET | Freq: Every day | ORAL | Status: DC

## 2013-01-27 NOTE — Telephone Encounter (Signed)
Left samples at front desk. Patient notified.

## 2013-02-01 ENCOUNTER — Telehealth: Payer: Self-pay | Admitting: Family Medicine

## 2013-02-01 NOTE — Telephone Encounter (Signed)
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 Stated her blood sugar was 274 and EMS told her to take her meds and to lie down.   Pt stated feeling little better but still tired -- asked what her blood sugar was and she said she didn't know and pt was advised to go to hospital and she refuses to go. Informed pt i would call the EMS but again she declines.

## 2013-02-21 ENCOUNTER — Other Ambulatory Visit: Payer: Self-pay | Admitting: Physician Assistant

## 2013-02-23 NOTE — Telephone Encounter (Addendum)
Pt has appt on 03/08/13.

## 2013-02-23 NOTE — Telephone Encounter (Signed)
I will refill , can someone call patient and tell her to make appointment. Thanks.

## 2013-02-23 NOTE — Telephone Encounter (Signed)
Last seen 11/03/12, last [facility] 07/19/12, chart sent back

## 2013-03-17 ENCOUNTER — Ambulatory Visit (INDEPENDENT_AMBULATORY_CARE_PROVIDER_SITE_OTHER): Payer: Managed Care, Other (non HMO) | Admitting: Family Medicine

## 2013-03-17 ENCOUNTER — Encounter: Payer: Self-pay | Admitting: Family Medicine

## 2013-03-17 VITALS — BP 119/77 | HR 97 | Temp 98.2°F | Wt 213.2 lb

## 2013-03-17 DIAGNOSIS — G8929 Other chronic pain: Secondary | ICD-10-CM

## 2013-03-17 DIAGNOSIS — E1149 Type 2 diabetes mellitus with other diabetic neurological complication: Secondary | ICD-10-CM

## 2013-03-17 DIAGNOSIS — E785 Hyperlipidemia, unspecified: Secondary | ICD-10-CM

## 2013-03-17 DIAGNOSIS — E1142 Type 2 diabetes mellitus with diabetic polyneuropathy: Secondary | ICD-10-CM

## 2013-03-17 DIAGNOSIS — E119 Type 2 diabetes mellitus without complications: Secondary | ICD-10-CM

## 2013-03-17 DIAGNOSIS — E1140 Type 2 diabetes mellitus with diabetic neuropathy, unspecified: Secondary | ICD-10-CM | POA: Insufficient documentation

## 2013-03-17 DIAGNOSIS — G2581 Restless legs syndrome: Secondary | ICD-10-CM

## 2013-03-17 DIAGNOSIS — I10 Essential (primary) hypertension: Secondary | ICD-10-CM

## 2013-03-17 LAB — POCT GLYCOSYLATED HEMOGLOBIN (HGB A1C): Hemoglobin A1C: 7.2

## 2013-03-17 MED ORDER — LISINOPRIL 10 MG PO TABS: 10.0000 mg | ORAL_TABLET | Freq: Every day | ORAL | Status: DC

## 2013-03-17 MED ORDER — GLIMEPIRIDE 2 MG PO TABS: ORAL_TABLET | ORAL | Status: DC

## 2013-03-17 NOTE — Progress Notes (Signed)
Patient ID: Casey Sanders, female   DOB: 02-26-1961, 52 y.o.   MRN: [id number] SUBJECTIVE: HPI: Patient is here for follow up of Diabetes Mellitus/hypertension/hyperlipidemia: Symptoms of DM: Denies Nocturia ,Denies Urinary Frequency , denies Blurred vision ,deniesDizziness,denies.Dysuria,denies paresthesias, denies extremity pain or ulcers.Marland Kitchendenies chest pain. has had an annual eye exam. do check the feet. Does check CBGs. Average CBG: hasn't been checking Denies episodes of hypoglycemia. Does have an emergency hypoglycemic plan. admits toCompliance with medications. Denies Problems with medications.  Allergies acting up  PMH/PSH: reviewed/updated in Epic  SH/FH: reviewed/updated in Epic  Allergies: reviewed/updated in Epic  Medications: reviewed/updated in Epic  Immunizations: reviewed/updated in Epic  ROS: As above in the HPI. All other systems are stable or negative.  OBJECTIVE: APPEARANCE:  Patient in no acute distress.The patient appeared well nourished and normally developed. Acyanotic. Waist: VITAL SIGNS:BP 119/77  Pulse 97  Temp(Src) 98.2 F (36.8 C) (Oral)  Wt 213 lb 3.2 oz (96.707 kg)  BMI 40.3 kg/m2 Obese WF  SKIN: warm and  Dry without overt rashes, tattoos and scars  HEAD and Neck: without JVD, Head and scalp: normal Eyes:No scleral icterus. Fundi normal, eye movements normal. Ears: Auricle normal, canal normal, Tympanic membranes normal, insufflation normal. Nose: normal Throat: normal Neck & thyroid: normal  CHEST & LUNGS: Chest wall: normal Lungs: Clear  CVS: Reveals the PMI to be normally located. Regular rhythm, First and Second Heart sounds are normal,  absence of murmurs, rubs or gallops. Peripheral vasculature: Radial pulses: normal Dorsal pedis pulses: normal Posterior pulses: normal  ABDOMEN:  Appearance: obese Benign,, no organomegaly, no masses, no Abdominal Aortic enlargement. No Guarding , no rebound. No Bruits. Bowel  sounds: normal  RECTAL: N/A GU: N/A  EXTREMETIES: nonedematous. Both Femoral and Pedal pulses are normal.  MUSCULOSKELETAL:  Spine: normal Joints: intact  NEUROLOGIC: oriented to time,place and person; nonfocal. Strength is normal Sensory is abnormal Reflexes are normal Cranial Nerves are normal.  ASSESSMENT: Diabetes mellitus - Plan: glimepiride (AMARYL) 2 MG tablet, POCT glycosylated hemoglobin (Hb A1C), POCT UA - Microalbumin, Hepatic function panel, Ferritin, Basic Metabolic Panel, Lipid panel  Hyperlipidemia - Plan: Hepatic function panel, Ferritin, Basic Metabolic Panel, Lipid panel, CANCELED: NMR Lipoprofile with Lipids, CANCELED: Hepatic function panel  Benign essential HTN - Plan: lisinopril (PRINIVIL,ZESTRIL) 10 MG tablet, Hepatic function panel, Ferritin, Basic Metabolic Panel, Lipid panel, CANCELED: BASIC METABOLIC PANEL WITH GFR  Restless leg syndrome - Plan: Hepatic function panel, Ferritin, Basic Metabolic Panel, Lipid panel, CANCELED: Ferritin  Chronic pain - Plan: Hepatic function panel, Ferritin, Basic Metabolic Panel, Lipid panel  Diabetic neuropathy, type II diabetes mellitus - Plan: Hepatic function panel, Ferritin, Basic Metabolic Panel, Lipid panel  PLAN: Orders Placed This Encounter  Procedures  . Hepatic function panel  . Ferritin  . Basic Metabolic Panel  . Lipid panel  . POCT glycosylated hemoglobin (Hb A1C)  . POCT UA - Microalbumin   Meds ordered this encounter  Medications  . amitriptyline (ELAVIL) 25 MG tablet    Sig: Take 50 mg by mouth at bedtime.   . Vitamin D, Ergocalciferol, (DRISDOL) 50000 UNITS CAPS    Sig: 50,000 Units every 7 (seven) days.   . ONE TOUCH ULTRA TEST test strip    Sig:   . methocarbamol (ROBAXIN) 500 MG tablet    Sig: Take 500 mg by mouth as needed.   . sertraline (ZOLOFT) 100 MG tablet    Sig: Take 100 mg by mouth 2 (two) times daily.   Marland Kitchen lisinopril (  PRINIVIL,ZESTRIL) 10 MG tablet    Sig: Take 1 tablet (10 mg  total) by mouth daily.    Dispense:  30 tablet    Refill:  5  . glimepiride (AMARYL) 2 MG tablet    Sig: In the morning and 2 tablets in the pm    Dispense:  90 tablet    Refill:  5        Dr Woodroe Mode Recommendations  Diet and Exercise discussed with patient.  For nutrition information, I recommend books:  1).Eat to Live by Dr Monico Hoar. 2).Prevent and Reverse Heart Disease by Dr Suzzette Righter. 3) Dr Katherina Right Book: Reversing Diabetes  Exercise recommendations are:  If unable to walk, then the patient can exercise in a chair 3 times a day. By flapping arms like a bird gently and raising legs outwards to the front.  If ambulatory, the patient can go for walks for 30 minutes 3 times a week. Then increase the intensity and duration as tolerated.  Goal is to try to attain exercise frequency to 5 times a week.  If applicable: Best to perform resistance exercises (machines or weights) 2 days a week and cardio type exercises 3 days per week.  Handouts in AVS: DM foot care. counselled on lifestyle changes.  Return in about 3 months (around 06/17/2013) for Recheck medical problems.   Rica Heather P. [place] Charon, M.D.

## 2013-03-17 NOTE — Patient Instructions (Addendum)
Diabetes and Foot Care Diabetes may cause you to have a poor blood supply (circulation) to your legs and feet. Because of this, the skin may be thinner, break easier, and heal more slowly. You also may have nerve damage in your legs and feet causing decreased feeling. You may not notice minor injuries to your feet that could lead to serious problems or infections. Taking care of your feet is one of the most important things you can do for yourself.  HOME CARE INSTRUCTIONS  Do not go barefoot. Bare feet are easily injured.  Check your feet daily for blisters, cuts, and redness.  Wash your feet with warm water (not hot) and mild soap. Pat your feet and between your toes until completely dry.  Apply a moisturizing lotion that does not contain alcohol or petroleum jelly to the dry skin on your feet and to dry brittle toenails. Do not put it between your toes.  Trim your toenails straight across. Do not dig under them or around the cuticle.  Do not cut corns or calluses, or try to remove them with medicine.  Wear clean cotton socks or stockings every day. Make sure they are not too tight. Do not wear knee high stockings since they may decrease blood flow to your legs.  Wear leather shoes that fit properly and have enough cushioning. To break in new shoes, wear them just a few hours a day to avoid injuring your feet.  Wear shoes at all times, even in the house.  Do not cross your legs. This may decrease the blood flow to your feet.  If you find a minor scrape, cut, or break in the skin on your feet, keep it and the skin around it clean and dry. These areas may be cleansed with mild soap and water. Do not use peroxide, alcohol, iodine or Merthiolate.  When you remove an adhesive bandage, be sure not to harm the skin around it.  If you have a wound, look at it several times a day to make sure it is healing.  Do not use heating pads or hot water bottles. Burns can occur. If you have lost feeling  in your feet or legs, you may not know it is happening until it is too late.  Report any cuts, sores or bruises to your caregiver. Do not wait! SEEK MEDICAL CARE IF:   You have an injury that is not healing or you notice redness, numbness, burning, or tingling.  Your feet always feel cold.  You have pain or cramps in your legs and feet. SEEK IMMEDIATE MEDICAL CARE IF:   There is increasing redness, swelling, or increasing pain in the wound.  There is a red line that goes up your leg.  Pus is coming from a wound.  You develop an unexplained oral temperature above 102 F (38.9 C), or as your caregiver suggests.  You notice a bad smell coming from an ulcer or wound. MAKE SURE YOU:   Understand these instructions.  Will watch your condition.  Will get help right away if you are not doing well or get worse. Document Released: 10/03/2000 Document Revised: 12/29/2011 Document Reviewed: 04/11/2009 [facility] Patient Information 2014 [place], [place].        Dr Woodroe Mode Recommendations  Diet and Exercise discussed with patient.  For nutrition information, I recommend books:  1).Eat to Live by Dr Monico Hoar. 2).Prevent and Reverse Heart Disease by Dr Suzzette Righter. 3) Dr Katherina Right Book: Reversing Diabetes  Exercise  recommendations are:  If unable to walk, then the patient can exercise in a chair 3 times a day. By flapping arms like a bird gently and raising legs outwards to the front.  If ambulatory, the patient can go for walks for 30 minutes 3 times a week. Then increase the intensity and duration as tolerated.  Goal is to try to attain exercise frequency to 5 times a week.  If applicable: Best to perform resistance exercises (machines or weights) 2 days a week and cardio type exercises 3 days per week.

## 2013-03-21 LAB — LIPID PANEL
Chol/HDL Ratio: 3.2 ratio units (ref 0.0–4.4)
Cholesterol, Total: 158 mg/dL (ref 100–199)
HDL: 49 mg/dL (ref >39)
LDL Calculated: 73 mg/dL (ref 0–99)
Triglycerides: 180 mg/dL — ABNORMAL HIGH (ref 0–149)
VLDL Cholesterol Cal: 36 mg/dL (ref 5–40)

## 2013-03-21 LAB — FERRITIN: Ferritin: 163 ng/mL — ABNORMAL HIGH (ref 15–150)

## 2013-03-21 LAB — BASIC METABOLIC PANEL
BUN/Creatinine Ratio: 18 (ref 9–23)
BUN: 17 mg/dL (ref 6–24)
CO2: 23 mmol/L (ref 19–28)
Calcium: 10.1 mg/dL (ref 8.7–10.2)
Chloride: 99 mmol/L (ref 97–108)
Creatinine, Ser: 0.92 mg/dL (ref 0.57–1.00)
GFR calc Af Amer: 82 mL/min/{1.73_m2} (ref >59)
GFR calc non Af Amer: 71 mL/min/{1.73_m2} (ref >59)
Glucose: 95 mg/dL (ref 65–99)
Potassium: 4.4 mmol/L (ref 3.5–5.2)
Sodium: 141 mmol/L (ref 134–144)

## 2013-03-21 LAB — HEPATIC FUNCTION PANEL
ALT: 27 IU/L (ref 0–32)
AST: 26 IU/L (ref 0–40)
Albumin: 4.5 g/dL (ref 3.5–5.5)
Alkaline Phosphatase: 50 IU/L (ref 39–117)
Bilirubin, Direct: 0.15 mg/dL (ref 0.00–0.40)
Total Bilirubin: 0.4 mg/dL (ref 0.0–1.2)
Total Protein: 7.5 g/dL (ref 6.0–8.5)

## 2013-04-13 ENCOUNTER — Telehealth: Payer: Self-pay | Admitting: Pharmacist

## 2013-04-18 NOTE — Telephone Encounter (Signed)
Patient wanted to know lab result from 03/17/2013.

## 2013-05-30 ENCOUNTER — Encounter: Payer: Self-pay | Admitting: *Deleted

## 2013-06-07 ENCOUNTER — Telehealth: Payer: Self-pay | Admitting: Pharmacist

## 2013-06-07 DIAGNOSIS — E1169 Type 2 diabetes mellitus with other specified complication: Secondary | ICD-10-CM

## 2013-06-08 MED ORDER — ROSUVASTATIN CALCIUM 20 MG PO TABS: 20.0000 mg | ORAL_TABLET | Freq: Every day | ORAL | Status: DC

## 2013-06-08 MED ORDER — FENOFIBRATE 150 MG PO CAPS: 1.0000 | ORAL_CAPSULE | Freq: Every day | ORAL | Status: DC

## 2013-06-08 NOTE — Telephone Encounter (Signed)
Samples left at front desk.  Patient aware.

## 2013-06-15 ENCOUNTER — Encounter: Payer: Self-pay | Admitting: Family Medicine

## 2013-06-15 ENCOUNTER — Ambulatory Visit (INDEPENDENT_AMBULATORY_CARE_PROVIDER_SITE_OTHER): Payer: Managed Care, Other (non HMO) | Admitting: Family Medicine

## 2013-06-15 VITALS — BP 123/79 | HR 81 | Temp 97.0°F | Wt 220.4 lb

## 2013-06-15 DIAGNOSIS — E1149 Type 2 diabetes mellitus with other diabetic neurological complication: Secondary | ICD-10-CM

## 2013-06-15 DIAGNOSIS — I10 Essential (primary) hypertension: Secondary | ICD-10-CM

## 2013-06-15 DIAGNOSIS — G2581 Restless legs syndrome: Secondary | ICD-10-CM

## 2013-06-15 DIAGNOSIS — E1140 Type 2 diabetes mellitus with diabetic neuropathy, unspecified: Secondary | ICD-10-CM

## 2013-06-15 DIAGNOSIS — E119 Type 2 diabetes mellitus without complications: Secondary | ICD-10-CM

## 2013-06-15 DIAGNOSIS — E1142 Type 2 diabetes mellitus with diabetic polyneuropathy: Secondary | ICD-10-CM

## 2013-06-15 DIAGNOSIS — E559 Vitamin D deficiency, unspecified: Secondary | ICD-10-CM

## 2013-06-15 DIAGNOSIS — C539 Malignant neoplasm of cervix uteri, unspecified: Secondary | ICD-10-CM

## 2013-06-15 DIAGNOSIS — E785 Hyperlipidemia, unspecified: Secondary | ICD-10-CM

## 2013-06-15 DIAGNOSIS — R002 Palpitations: Secondary | ICD-10-CM

## 2013-06-15 LAB — POCT GLYCOSYLATED HEMOGLOBIN (HGB A1C): Hemoglobin A1C: 6.8

## 2013-06-15 MED ORDER — LISINOPRIL 10 MG PO TABS: 10.0000 mg | ORAL_TABLET | Freq: Every day | ORAL | Status: DC

## 2013-06-15 NOTE — Progress Notes (Signed)
Patient ID: Casey Sanders, female   DOB: 06/11/1961, 52 y.o.   MRN: [id number] SUBJECTIVE: CC: Chief Complaint  Patient presents with  . Follow-up    3 month follow up not eatien since 8am weight up 7 lbs    HPI: Patient is here for follow up of Diabetes Mellitus: Symptoms evaluated: Denies Nocturia ,Denies Urinary Frequency , denies Blurred vision ,deniesDizziness,denies.Dysuria,denies paresthesias, denies extremity pain or ulcers.Marland Kitchendenies chest pain. has had an annual eye exam. do check the feet. Does check CBGs. Average CBG:171 Denies episodes of hypoglycemia. Does have an emergency hypoglycemic plan. admits toCompliance with medications. Denies Problems with medications.  Breakfast: egg sandwich with wheat bread or cereal. Lunch: salad Supper: chicken   Past Medical History  Diagnosis Date  . Palpitations     monitor 9/05: NSR, sinus tachy, PVCs  . Chest pain     a. Myoview 9/05: no ischemia, no scar, EF 70%  . DM2 (diabetes mellitus, type 2)   . HLD (hyperlipidemia)   . HTN (hypertension)   . GERD (gastroesophageal reflux disease)     Hiatal Hernia  . Asthma   . Diverticulosis   . Obesity   . Lumbar disc disease   . Anxiety and depression   . Allergic rhinitis   . Nephrolithiasis   . Frequent UTI   . Left rotator cuff tear   . Fibromyalgia    Past Surgical History  Procedure Laterality Date  . Total abdominal hysterectomy w/ bilateral salpingoophorectomy    . Cesarean section    . Foot surgery    . Tonsillectomy    . Right wrist surgery     History   Social History  . Marital Status: Married    Spouse Name: N/A    Number of Children: 2  . Years of Education: N/A   Occupational History  . disabled    Social History Main Topics  . Smoking status: Former Games developer  . Smokeless tobacco: Not on file  . Alcohol Use: No  . Drug Use: No  . Sexual Activity: Not on file   Other Topics Concern  . Not on file   Social History Narrative  . No narrative  on file   Family History  Problem Relation Age of Onset  . Coronary artery disease Mother   . Coronary artery disease Father    Current Outpatient Prescriptions on File Prior to Visit  Medication Sig Dispense Refill  . albuterol (PROVENTIL HFA;VENTOLIN HFA) 108 (90 BASE) MCG/ACT inhaler Inhale 2 puffs into the lungs. 2puffs q 8 hr      . amitriptyline (ELAVIL) 25 MG tablet Take 50 mg by mouth at bedtime.       Marland Kitchen aspirin 81 MG EC tablet Take 81 mg by mouth daily.        . clonazePAM (KLONOPIN) 2 MG tablet Take 2 mg by mouth 5 (five) times daily.        . cyclobenzaprine (FLEXERIL) 10 MG tablet Take 10 mg by mouth as directed.        Marland Kitchen exenatide (BYETTA) 10 MCG/0.04ML SOLN Inject into the skin 2 (two) times daily with a meal.        . famotidine (PEPCID) 20 MG tablet Take 1 tablet (20 mg total) by mouth 2 (two) times daily.  60 tablet  0  . Fenofibrate (LIPOFEN) 150 MG CAPS Take 1 capsule (150 mg total) by mouth daily.  35 each  0  . fentaNYL (DURAGESIC - DOSED MCG/HR) 25 MCG/HR Place  1 patch onto the skin every 3 (three) days.        . fexofenadine (ALLEGRA) 60 MG tablet Take 60 mg by mouth 2 (two) times daily.        . fish oil-omega-3 fatty acids 1000 MG capsule Take 2 g by mouth daily.        . fluticasone (FLONASE) 50 MCG/ACT nasal spray 2 sprays by Nasal route daily. 1 spray each nostril qd       . Fluticasone-Salmeterol (ADVAIR DISKUS) 250-50 MCG/DOSE AEPB Inhale 1 puff into the lungs every 12 (twelve) hours.        . gabapentin (NEURONTIN) 800 MG tablet Take 800 mg by mouth 4 (four) times daily.       Marland Kitchen glimepiride (AMARYL) 2 MG tablet In the morning and 2 tablets in the pm  90 tablet  5  . lidocaine (LIDODERM) 5 % Place 1 patch onto the skin daily. Remove & Discard patch within 12 hours or as directed by MD       . lisinopril (PRINIVIL,ZESTRIL) 10 MG tablet Take 1 tablet (10 mg total) by mouth daily.  30 tablet  5  . methocarbamol (ROBAXIN) 500 MG tablet Take 500 mg by mouth as  needed.       . Multiple Vitamin (MULTIVITAMIN PO) Take by mouth daily.        . nitrofurantoin (MACRODANTIN) 100 MG capsule Take 100 mg by mouth daily.        . ONE TOUCH ULTRA TEST test strip       . rosuvastatin (CRESTOR) 20 MG tablet Take 1 tablet (20 mg total) by mouth daily.  28 tablet  0  . sertraline (ZOLOFT) 100 MG tablet Take 100 mg by mouth 2 (two) times daily.       . Vitamin D, Ergocalciferol, (DRISDOL) 50000 UNITS CAPS 50,000 Units every 7 (seven) days.        No current facility-administered medications on file prior to visit.   Allergies  Allergen Reactions  . Codeine Other (See Comments)    unknown  . Niaspan [Niacin Er] Other (See Comments)    Increase blood glucose   Immunization History  Administered Date(s) Administered  . Influenza Whole 10/20/2006  . Td 10/20/2002   Prior to Admission medications   Medication Sig Start Date End Date Taking? Authorizing Provider  albuterol (PROVENTIL HFA;VENTOLIN HFA) 108 (90 BASE) MCG/ACT inhaler Inhale 2 puffs into the lungs. 2puffs q 8 hr   Yes Historical Provider, MD  amitriptyline (ELAVIL) 25 MG tablet Take 50 mg by mouth at bedtime.  03/03/13  Yes Historical Provider, MD  aspirin 81 MG EC tablet Take 81 mg by mouth daily.     Yes Historical Provider, MD  clonazePAM (KLONOPIN) 2 MG tablet Take 2 mg by mouth 5 (five) times daily.     Yes Historical Provider, MD  cyclobenzaprine (FLEXERIL) 10 MG tablet Take 10 mg by mouth as directed.     Yes Historical Provider, MD  exenatide (BYETTA) 10 MCG/0.04ML SOLN Inject into the skin 2 (two) times daily with a meal.     Yes Historical Provider, MD  famotidine (PEPCID) 20 MG tablet Take 1 tablet (20 mg total) by mouth 2 (two) times daily. 07/22/12  Yes Scott T Alben Spittle, PA-C  Fenofibrate (LIPOFEN) 150 MG CAPS Take 1 capsule (150 mg total) by mouth daily. 06/08/13  Yes Tammy Eckard, PHARMD  fentaNYL (DURAGESIC - DOSED MCG/HR) 25 MCG/HR Place 1 patch onto the skin every  3 (three) days.     Yes  Historical Provider, MD  fexofenadine (ALLEGRA) 60 MG tablet Take 60 mg by mouth 2 (two) times daily.     Yes Historical Provider, MD  fish oil-omega-3 fatty acids 1000 MG capsule Take 2 g by mouth daily.     Yes Historical Provider, MD  fluticasone (FLONASE) 50 MCG/ACT nasal spray 2 sprays by Nasal route daily. 1 spray each nostril qd    Yes Historical Provider, MD  Fluticasone-Salmeterol (ADVAIR DISKUS) 250-50 MCG/DOSE AEPB Inhale 1 puff into the lungs every 12 (twelve) hours.     Yes Historical Provider, MD  gabapentin (NEURONTIN) 800 MG tablet Take 800 mg by mouth 4 (four) times daily.    Yes Historical Provider, MD  glimepiride (AMARYL) 2 MG tablet In the morning and 2 tablets in the pm 03/17/13  Yes Ileana Ladd, MD  lidocaine (LIDODERM) 5 % Place 1 patch onto the skin daily. Remove & Discard patch within 12 hours or as directed by MD    Yes Historical Provider, MD  lisinopril (PRINIVIL,ZESTRIL) 10 MG tablet Take 1 tablet (10 mg total) by mouth daily. 03/17/13  Yes Ileana Ladd, MD  methocarbamol (ROBAXIN) 500 MG tablet Take 500 mg by mouth as needed.  12/22/12  Yes Historical Provider, MD  Multiple Vitamin (MULTIVITAMIN PO) Take by mouth daily.     Yes Historical Provider, MD  nitrofurantoin (MACRODANTIN) 100 MG capsule Take 100 mg by mouth daily.     Yes Historical Provider, MD  ONE TOUCH ULTRA TEST test strip  12/28/12  Yes Historical Provider, MD  rosuvastatin (CRESTOR) 20 MG tablet Take 1 tablet (20 mg total) by mouth daily. 06/08/13  Yes Tammy Eckard, PHARMD  sertraline (ZOLOFT) 100 MG tablet Take 100 mg by mouth 2 (two) times daily.  12/28/12  Yes Historical Provider, MD  Vitamin D, Ergocalciferol, (DRISDOL) 50000 UNITS CAPS 50,000 Units every 7 (seven) days.  02/19/13  Yes Historical Provider, MD    ROS: As above in the HPI. All other systems are stable or negative.  OBJECTIVE: APPEARANCE:  Patient in no acute distress.The patient appeared well nourished and normally developed.  Acyanotic. Waist: VITAL SIGNS:BP 123/79  Pulse 81  Temp(Src) 97 F (36.1 C) (Oral)  Wt 220 lb 6.4 oz (99.973 kg)  BMI 41.67 kg/m2  Morbidly obese WF  SKIN: warm and  Dry without overt rashes, tattoos and scars  HEAD and Neck: without JVD, Head and scalp: normal Eyes:No scleral icterus. Fundi normal, eye movements normal. Ears: Auricle normal, canal normal, Tympanic membranes normal, insufflation normal. Nose: normal Throat: normal Neck & thyroid: normal  CHEST & LUNGS: Chest wall: normal Lungs: Clear  CVS: Reveals the PMI to be normally located. Regular rhythm, First and Second Heart sounds are normal,  absence of murmurs, rubs or gallops. Peripheral vasculature: Radial pulses: normal Dorsal pedis pulses: reduced Posterior pulses: reduced  ABDOMEN:  Appearance:obese Benign, no organomegaly, no masses, no Abdominal Aortic enlargement. No Guarding , no rebound. No Bruits. Bowel sounds: normal  RECTAL: N/A GU: N/A  EXTREMETIES: trace edema.  MUSCULOSKELETAL:  Spine: Reduced ROM due to pain Joints: reduced ROM of knees and hips due to pain. Ambulates with a cane.  NEUROLOGIC: oriented to time,place and person; nonfocal in extremities, however there is  Some sensory loses on the toes of the feet with the monofilament test.  ASSESSMENT: Restless leg syndrome  Palpitations  Cervical adenocarcinoma  Hyperlipidemia - Plan: CMP14+EGFR, Lipid panel  Diabetic neuropathy, type  II diabetes mellitus  Diabetes mellitus - Plan: POCT glycosylated hemoglobin (Hb A1C), CMP14+EGFR  Benign essential HTN - Plan: lisinopril (PRINIVIL,ZESTRIL) 10 MG tablet, CMP14+EGFR  Unspecified vitamin D deficiency - Plan: Vit D  25 hydroxy (rtn osteoporosis monitoring)   PLAN: Continue with the pain clinic in [place] Continue with [facility] -Dr Evelene Croon in [place]  Orders Placed This Encounter  Procedures  . CMP14+EGFR  . Lipid panel  . Vit D  25 hydroxy (rtn osteoporosis  monitoring)  . POCT glycosylated hemoglobin (Hb A1C)    Meds ordered this encounter  Medications  . lisinopril (PRINIVIL,ZESTRIL) 10 MG tablet    Sig: Take 1 tablet (10 mg total) by mouth daily.    Dispense:  30 tablet    Refill:  5   Results for orders placed in visit on 06/15/13  POCT GLYCOSYLATED HEMOGLOBIN (HGB A1C)      Result Value Range   Hemoglobin A1C 6.8 %     Return in about 4 months (around 10/15/2013) for recheck BP, Recheck medical problems.  Yue Glasheen P. [place] Charon, M.D.

## 2013-06-15 NOTE — Telephone Encounter (Signed)
done

## 2013-06-16 LAB — LIPID PANEL
Chol/HDL Ratio: 3.5 ratio units (ref 0.0–4.4)
Cholesterol, Total: 141 mg/dL (ref 100–199)
HDL: 40 mg/dL (ref >39)
LDL Calculated: 65 mg/dL (ref 0–99)
Triglycerides: 179 mg/dL — ABNORMAL HIGH (ref 0–149)
VLDL Cholesterol Cal: 36 mg/dL (ref 5–40)

## 2013-06-16 LAB — CMP14+EGFR
ALT: 29 IU/L (ref 0–32)
AST: 29 IU/L (ref 0–40)
Albumin/Globulin Ratio: 1.8 (ref 1.1–2.5)
Albumin: 4.7 g/dL (ref 3.5–5.5)
Alkaline Phosphatase: 47 IU/L (ref 39–117)
BUN/Creatinine Ratio: 22 (ref 9–23)
BUN: 18 mg/dL (ref 6–24)
CO2: 28 mmol/L (ref 18–29)
Calcium: 10.1 mg/dL (ref 8.7–10.2)
Chloride: 99 mmol/L (ref 97–108)
Creatinine, Ser: 0.83 mg/dL (ref 0.57–1.00)
GFR calc Af Amer: 92 mL/min/{1.73_m2} (ref >59)
GFR calc non Af Amer: 80 mL/min/{1.73_m2} (ref >59)
Globulin, Total: 2.6 g/dL (ref 1.5–4.5)
Glucose: 96 mg/dL (ref 65–99)
Potassium: 4.3 mmol/L (ref 3.5–5.2)
Sodium: 142 mmol/L (ref 134–144)
Total Bilirubin: 0.5 mg/dL (ref 0.0–1.2)
Total Protein: 7.3 g/dL (ref 6.0–8.5)

## 2013-06-16 LAB — VITAMIN D 25 HYDROXY (VIT D DEFICIENCY, FRACTURES): Vit D, 25-Hydroxy: 22.5 ng/mL — ABNORMAL LOW (ref 30.0–100.0)

## 2013-06-17 ENCOUNTER — Ambulatory Visit: Payer: Managed Care, Other (non HMO) | Admitting: Family Medicine

## 2013-07-07 ENCOUNTER — Emergency Department (HOSPITAL_COMMUNITY): Payer: Managed Care, Other (non HMO)

## 2013-07-07 ENCOUNTER — Observation Stay (HOSPITAL_COMMUNITY)
Admission: EM | Admit: 2013-07-07 | Discharge: 2013-07-08 | Disposition: A | Discharge status: Home / Self Care | Payer: Managed Care, Other (non HMO) | Attending: Internal Medicine | Admitting: Internal Medicine

## 2013-07-07 ENCOUNTER — Encounter (HOSPITAL_COMMUNITY): Payer: Self-pay | Admitting: Emergency Medicine

## 2013-07-07 ENCOUNTER — Ambulatory Visit (INDEPENDENT_AMBULATORY_CARE_PROVIDER_SITE_OTHER): Payer: Managed Care, Other (non HMO) | Admitting: Family Medicine

## 2013-07-07 VITALS — BP 120/84 | HR 107 | Temp 97.9°F

## 2013-07-07 DIAGNOSIS — I10 Essential (primary) hypertension: Secondary | ICD-10-CM

## 2013-07-07 DIAGNOSIS — R079 Chest pain, unspecified: Principal | ICD-10-CM | POA: Insufficient documentation

## 2013-07-07 DIAGNOSIS — M79609 Pain in unspecified limb: Secondary | ICD-10-CM | POA: Insufficient documentation

## 2013-07-07 DIAGNOSIS — E119 Type 2 diabetes mellitus without complications: Secondary | ICD-10-CM

## 2013-07-07 DIAGNOSIS — E785 Hyperlipidemia, unspecified: Secondary | ICD-10-CM

## 2013-07-07 DIAGNOSIS — E1169 Type 2 diabetes mellitus with other specified complication: Secondary | ICD-10-CM | POA: Diagnosis present

## 2013-07-07 DIAGNOSIS — E1142 Type 2 diabetes mellitus with diabetic polyneuropathy: Secondary | ICD-10-CM

## 2013-07-07 DIAGNOSIS — R0602 Shortness of breath: Secondary | ICD-10-CM | POA: Insufficient documentation

## 2013-07-07 DIAGNOSIS — R002 Palpitations: Secondary | ICD-10-CM

## 2013-07-07 DIAGNOSIS — K219 Gastro-esophageal reflux disease without esophagitis: Secondary | ICD-10-CM | POA: Insufficient documentation

## 2013-07-07 DIAGNOSIS — G8929 Other chronic pain: Secondary | ICD-10-CM

## 2013-07-07 DIAGNOSIS — E1149 Type 2 diabetes mellitus with other diabetic neurological complication: Secondary | ICD-10-CM

## 2013-07-07 LAB — CBC WITH DIFFERENTIAL/PLATELET
Basophils Relative: 1 % (ref 0–1)
Eosinophils Absolute: 0.1 10*3/uL (ref 0.0–0.7)
HCT: 41.1 % (ref 36.0–46.0)
Hemoglobin: 14.3 g/dL (ref 12.0–15.0)
MCH: 28.8 pg (ref 26.0–34.0)
MCHC: 34.8 g/dL (ref 30.0–36.0)
Monocytes Absolute: 0.5 10*3/uL (ref 0.1–1.0)
Neutro Abs: 4.8 10*3/uL (ref 1.7–7.7)

## 2013-07-07 LAB — GLUCOSE, CAPILLARY
Glucose-Capillary: 63 mg/dL — ABNORMAL LOW (ref 70–99)
Glucose-Capillary: 246 mg/dL — ABNORMAL HIGH (ref 70–99)

## 2013-07-07 LAB — BASIC METABOLIC PANEL
BUN: 19 mg/dL (ref 6–23)
Calcium: 9.8 mg/dL (ref 8.4–10.5)
GFR calc non Af Amer: 80 mL/min — ABNORMAL LOW (ref >90)
Glucose, Bld: 191 mg/dL — ABNORMAL HIGH (ref 70–99)

## 2013-07-07 LAB — URINALYSIS, ROUTINE W REFLEX MICROSCOPIC
Bilirubin Urine: NEGATIVE
Hgb urine dipstick: NEGATIVE
Specific Gravity, Urine: 1.014 (ref 1.005–1.030)
Urobilinogen, UA: 1 mg/dL (ref 0.0–1.0)

## 2013-07-07 LAB — POCT I-STAT TROPONIN I

## 2013-07-07 LAB — URINE MICROSCOPIC-ADD ON

## 2013-07-07 IMAGING — CT CT ANGIO CHEST
2 of 10 series · 19 of 46 positions shown · IV contrast (APPLIED)
Comparison: None.

CLINICAL DATA: Left upper chest pain. Left arm pain.

EXAM:
CT ANGIOGRAPHY CHEST WITH CONTRAST
TECHNIQUE: Multidetector CT imaging of the chest was performed using the
standard protocol during bolus administration of intravenous
contrast. Multiplanar CT image reconstructions including MIPs were
obtained to evaluate the vascular anatomy.
CONTRAST:  100mL OMNIPAQUE IOHEXOL 350 MG/ML SOLN

[Series 6: thins · axial · 0.79mm/px · z∈[+1134,+1347]mm · 16 of 241 slices shown]
[im 14/241  lung]
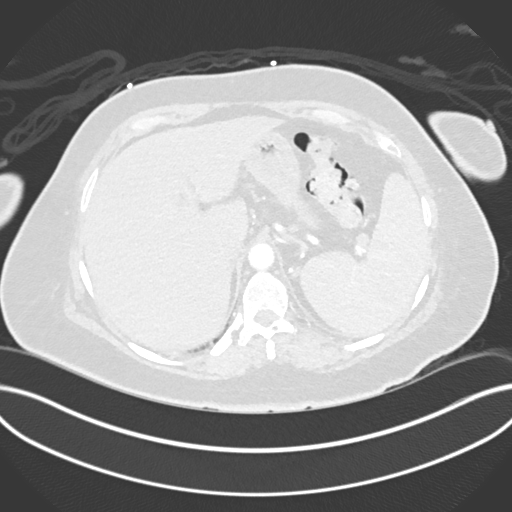
[im 27/241  soft-tissue]
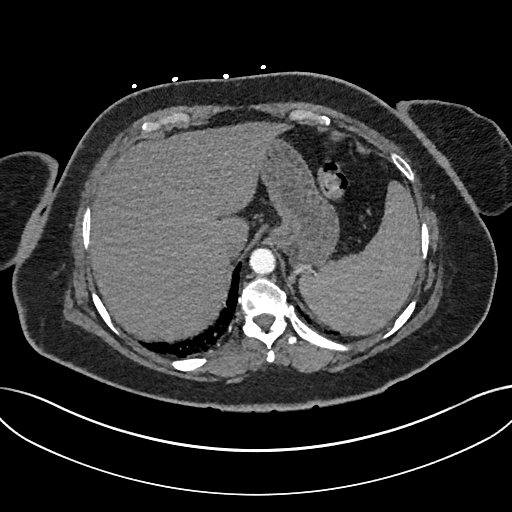
[im 41/241  lung]
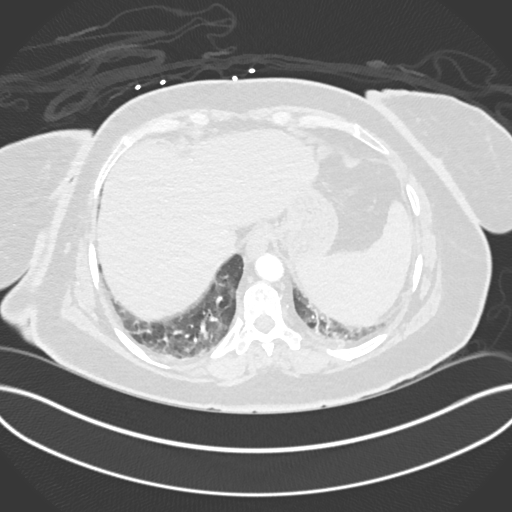
[im 54/241  soft-tissue]
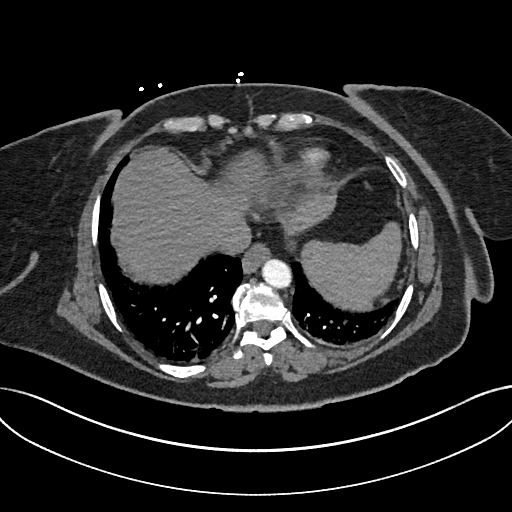
[im 67/241  lung]
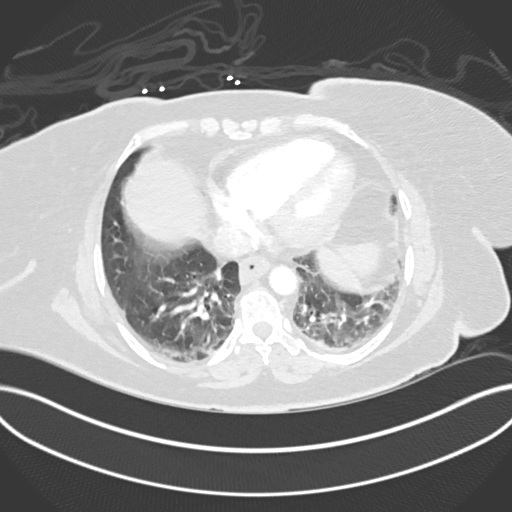
[im 81/241  soft-tissue]
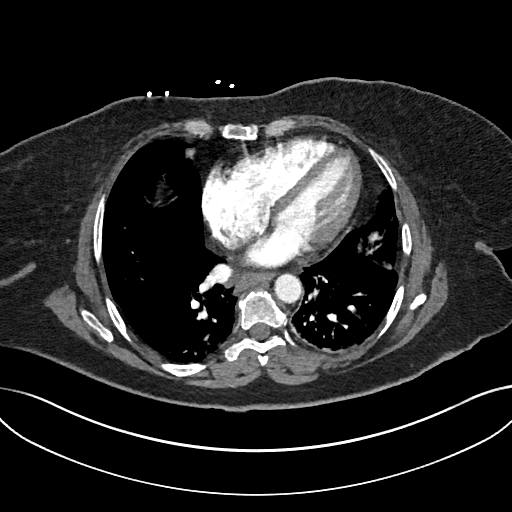
[im 94/241  lung]
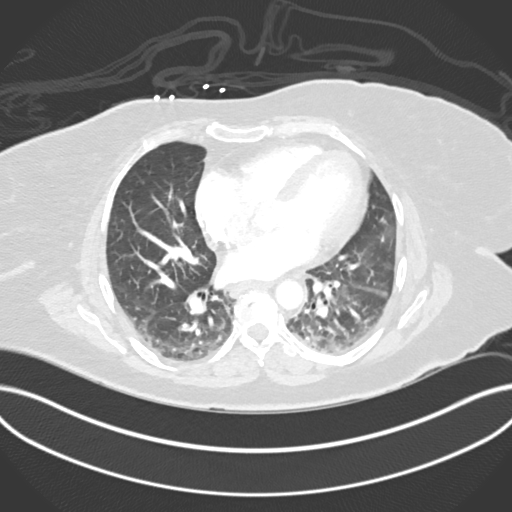
[im 107/241  soft-tissue]
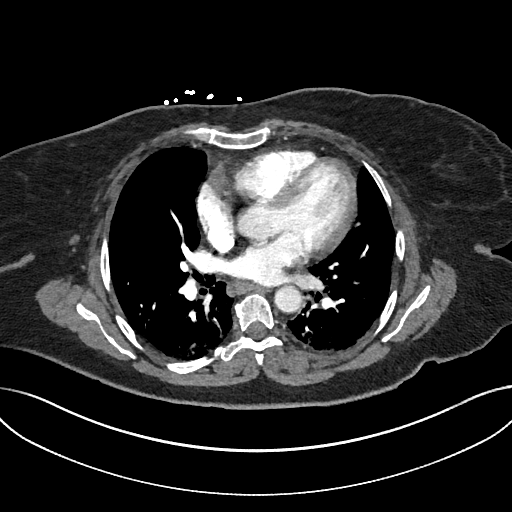
[im 134/241  lung]
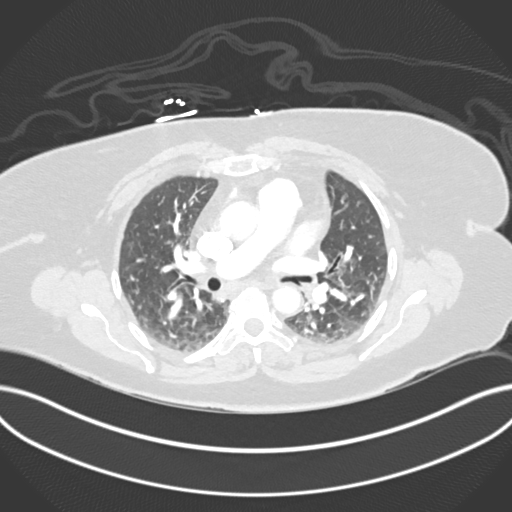
[im 147/241  soft-tissue]
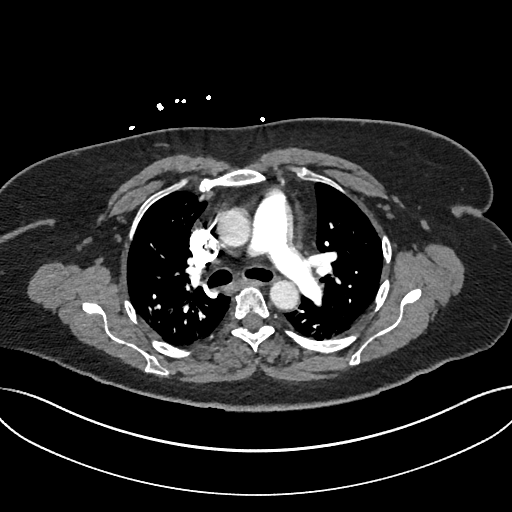
[im 161/241  lung]
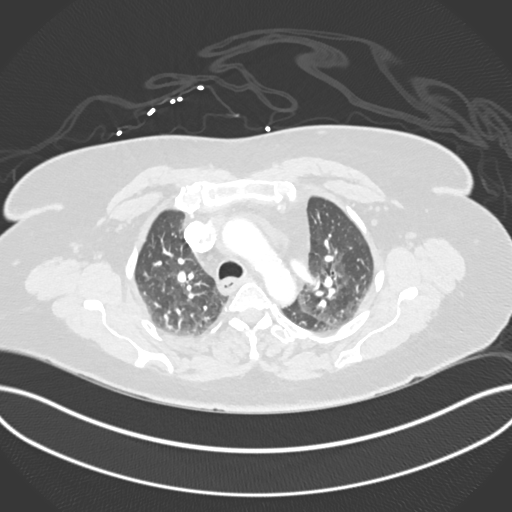
[im 174/241  soft-tissue]
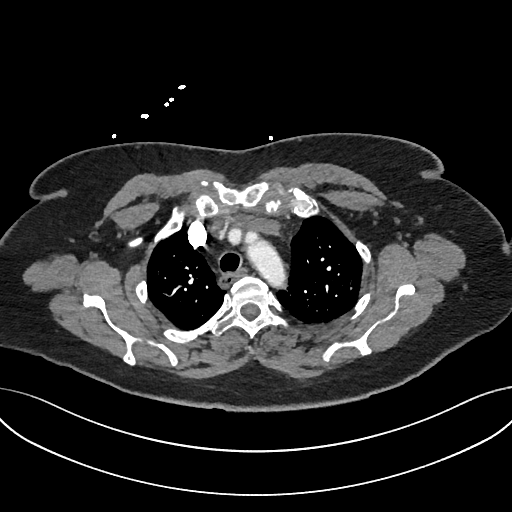
[im 187/241  lung]
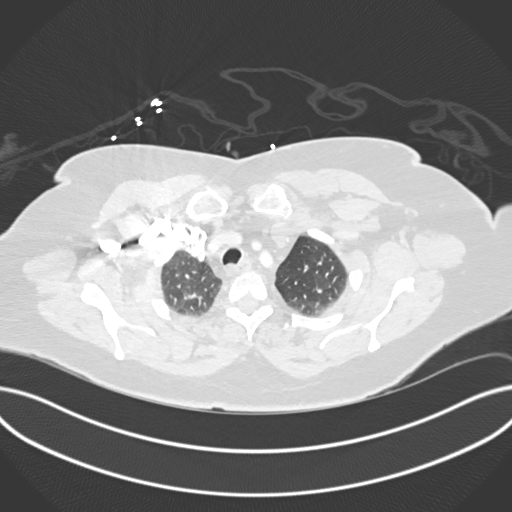
[im 201/241  soft-tissue]
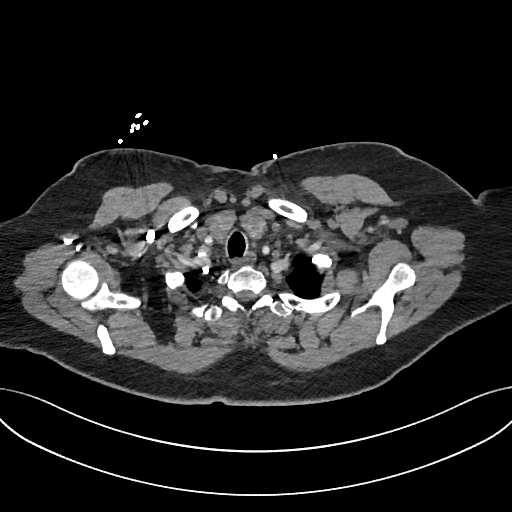
[im 214/241  lung]
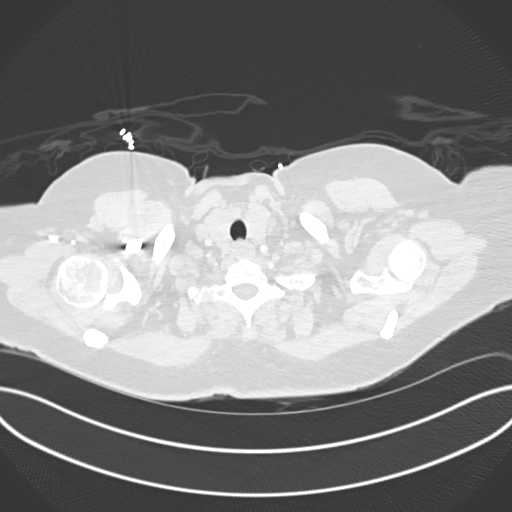
[im 227/241  soft-tissue]
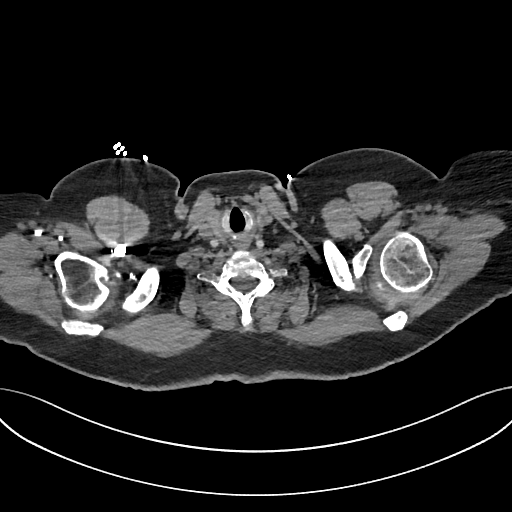

[Series 8: coronal mpr · coronal · 0.66mm/px · 3 of 151 slices shown]
[im 38/151  soft-tissue]
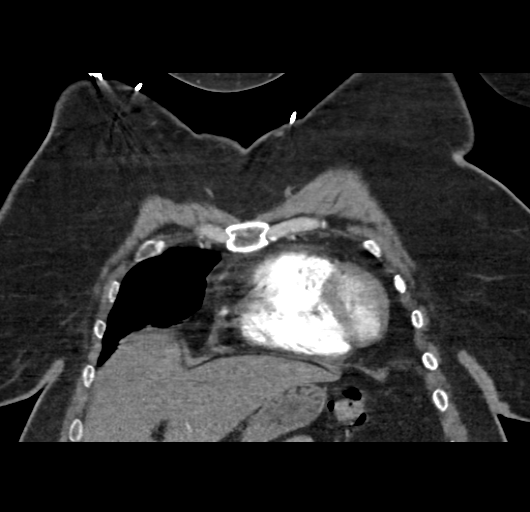
[im 76/151  soft-tissue]
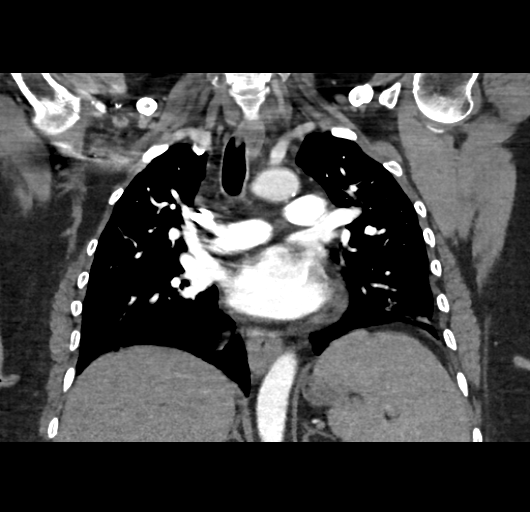
[im 113/151  soft-tissue]
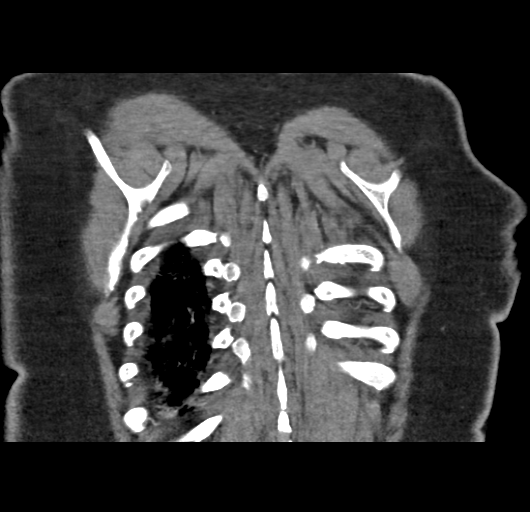

[19 of 46 positions shown; findings below may reference images not displayed]

FINDINGS: No filling defects in the pulmonary arteries to suggest pulmonary
emboli. Dependent atelectasis in the lungs bilaterally. Heart is
borderline in size. Mild vascular congestion. No pleural effusions.

No mediastinal, hilar, or axillary adenopathy. Chest wall soft
tissues unremarkable. Imaging into the upper abdomen shows no acute
findings. No acute bony abnormality.

Review of the MIP images confirms the above findings.
IMPRESSION: No evidence of pulmonary embolus.

Mild vascular congestion.

Dependent atelectasis.

## 2013-07-07 IMAGING — CR DG CHEST 2V
2 series · 2 of 2 positions shown · non-contrast
Comparison: None.

CLINICAL DATA: Chest pain, shortness of breath, cough.

EXAM:
CHEST  2 VIEW

[w chest lat]
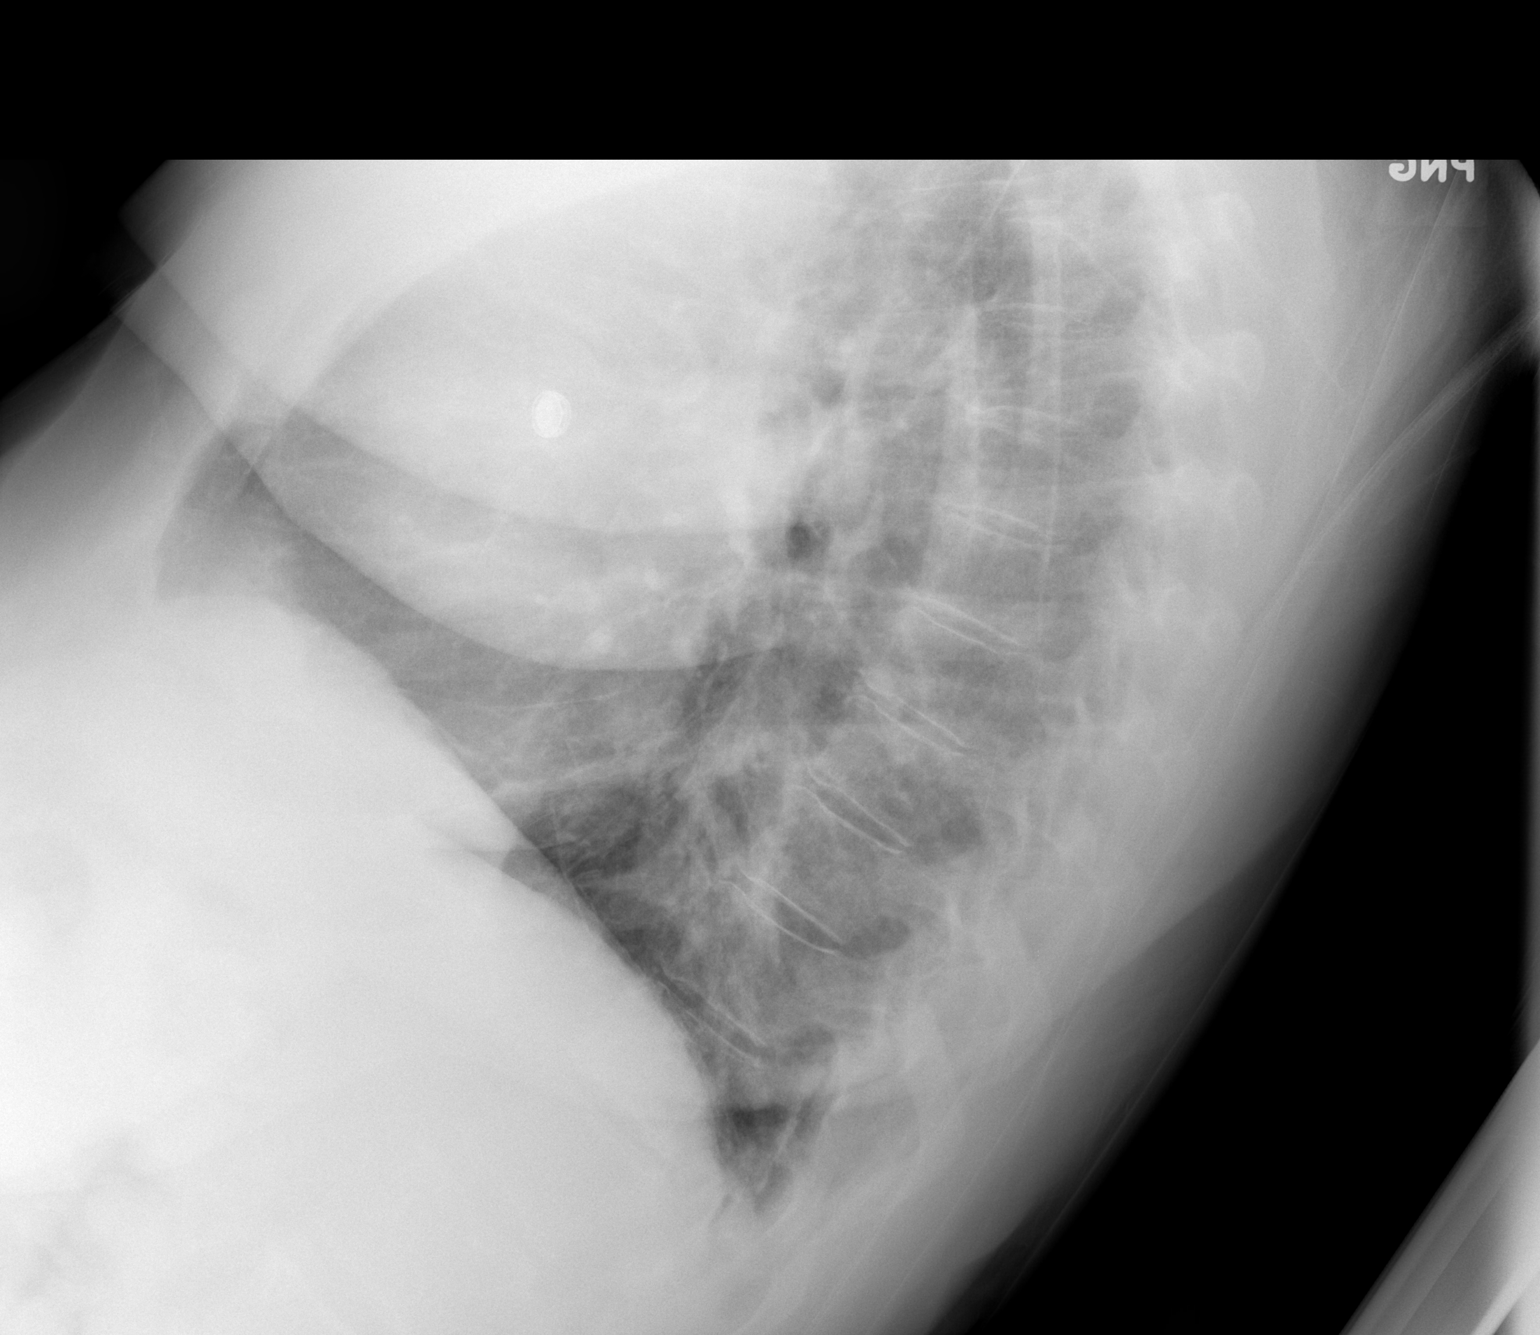

[view not recorded]
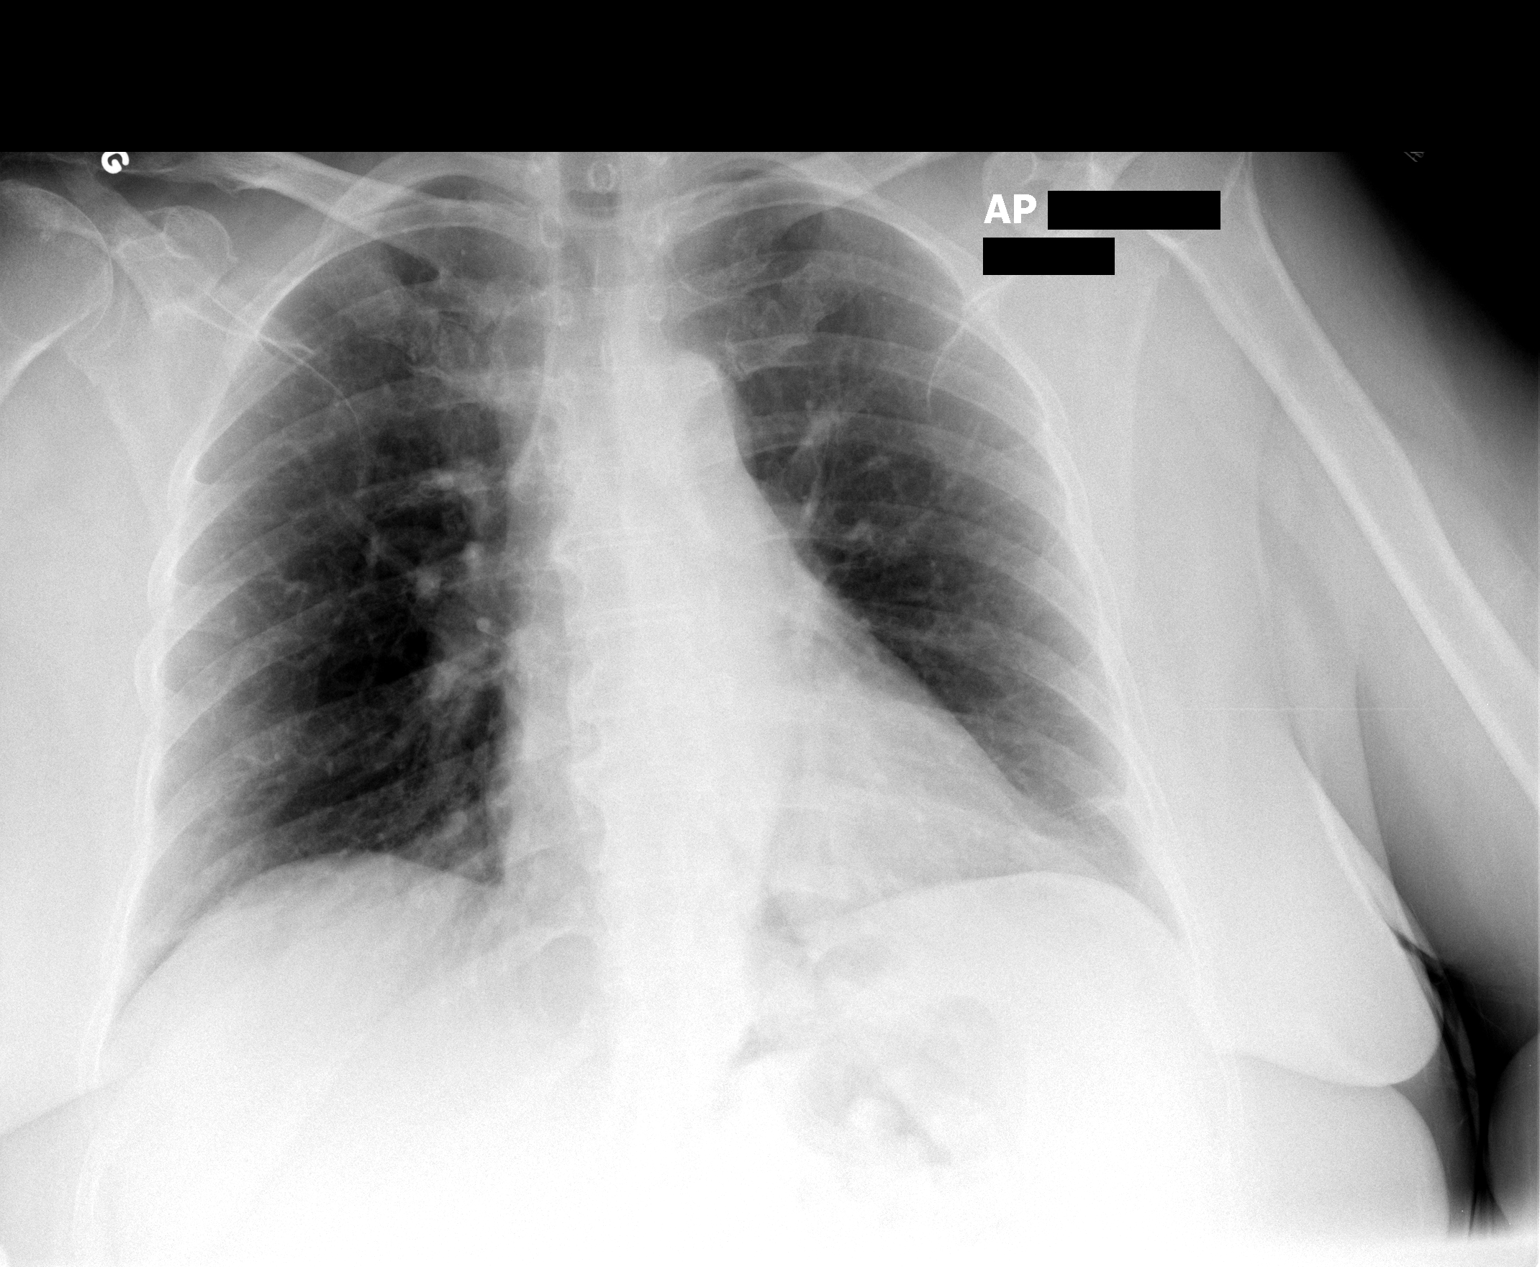

[2 of 2 positions shown; findings below may reference images not displayed]

FINDINGS: Minimal linear densities in both lung bases, scarring versus
atelectasis. Heart is upper limits normal in size. No acute
opacities or effusions. No acute bony abnormality.
IMPRESSION: Bibasilar atelectasis or scarring.

## 2013-07-07 MED ORDER — PNEUMOCOCCAL VAC POLYVALENT 25 MCG/0.5ML IJ INJ
0.5000 mL | INJECTION | INTRAMUSCULAR | Status: DC
  Filled 2013-07-07: qty 0.5

## 2013-07-07 MED ORDER — AMITRIPTYLINE HCL 50 MG PO TABS
50.0000 mg | ORAL_TABLET | Freq: Every day | ORAL | Status: DC
  Administered 2013-07-07: 50 mg via ORAL
  Filled 2013-07-07 (×2): qty 1

## 2013-07-07 MED ORDER — GABAPENTIN 800 MG PO TABS
800.0000 mg | ORAL_TABLET | Freq: Four times a day (QID) | ORAL | Status: DC
  Filled 2013-07-07: qty 1

## 2013-07-07 MED ORDER — CYCLOBENZAPRINE HCL 10 MG PO TABS
10.0000 mg | ORAL_TABLET | Freq: Three times a day (TID) | ORAL | Status: DC | PRN
  Filled 2013-07-07: qty 1

## 2013-07-07 MED ORDER — MOMETASONE FURO-FORMOTEROL FUM 100-5 MCG/ACT IN AERO
2.0000 | INHALATION_SPRAY | Freq: Two times a day (BID) | RESPIRATORY_TRACT | Status: DC
  Administered 2013-07-07 – 2013-07-08 (×2): 2 via RESPIRATORY_TRACT
  Filled 2013-07-07: qty 8.8

## 2013-07-07 MED ORDER — INFLUENZA VAC SPLIT QUAD 0.5 ML IM SUSP
0.5000 mL | INTRAMUSCULAR | Status: DC
  Filled 2013-07-07: qty 0.5

## 2013-07-07 MED ORDER — GABAPENTIN 400 MG PO CAPS
800.0000 mg | ORAL_CAPSULE | Freq: Four times a day (QID) | ORAL | Status: DC
  Administered 2013-07-07 – 2013-07-08 (×2): 800 mg via ORAL
  Filled 2013-07-07 (×5): qty 2

## 2013-07-07 MED ORDER — DEXTROSE 5 % IV SOLN
1.0000 g | INTRAVENOUS | Status: DC
  Filled 2013-07-07: qty 10

## 2013-07-07 MED ORDER — HEPARIN SODIUM (PORCINE) 5000 UNIT/ML IJ SOLN
5000.0000 [IU] | Freq: Three times a day (TID) | INTRAMUSCULAR | Status: DC
  Administered 2013-07-07 – 2013-07-08 (×2): 5000 [IU] via SUBCUTANEOUS
  Filled 2013-07-07 (×5): qty 1

## 2013-07-07 MED ORDER — FENOFIBRATE 160 MG PO TABS
160.0000 mg | ORAL_TABLET | Freq: Every day | ORAL | Status: DC
  Administered 2013-07-07 – 2013-07-08 (×2): 160 mg via ORAL
  Filled 2013-07-07 (×2): qty 1

## 2013-07-07 MED ORDER — LISINOPRIL 10 MG PO TABS
10.0000 mg | ORAL_TABLET | Freq: Every day | ORAL | Status: DC
  Administered 2013-07-07 – 2013-07-08 (×2): 10 mg via ORAL
  Filled 2013-07-07 (×2): qty 1

## 2013-07-07 MED ORDER — SODIUM CHLORIDE 0.9 % IJ SOLN
3.0000 mL | Freq: Two times a day (BID) | INTRAMUSCULAR | Status: DC
  Administered 2013-07-07: 3 mL via INTRAVENOUS

## 2013-07-07 MED ORDER — DEXTROSE 5 % IV SOLN
1.0000 g | Freq: Once | INTRAVENOUS | Status: AC
  Administered 2013-07-07: 14:00:00 via INTRAVENOUS
  Filled 2013-07-07: qty 10

## 2013-07-07 MED ORDER — SODIUM CHLORIDE 0.9 % IV SOLN: Freq: Once | INTRAVENOUS | Status: DC

## 2013-07-07 MED ORDER — ASPIRIN EC 81 MG PO TBEC
81.0000 mg | DELAYED_RELEASE_TABLET | Freq: Every day | ORAL | Status: DC
  Administered 2013-07-07 – 2013-07-08 (×2): 81 mg via ORAL
  Filled 2013-07-07 (×2): qty 1

## 2013-07-07 MED ORDER — MORPHINE SULFATE 2 MG/ML IJ SOLN
1.0000 mg | INTRAMUSCULAR | Status: DC | PRN
  Administered 2013-07-07: 2 mg via INTRAVENOUS
  Filled 2013-07-07: qty 1

## 2013-07-07 MED ORDER — SERTRALINE HCL 100 MG PO TABS
100.0000 mg | ORAL_TABLET | Freq: Two times a day (BID) | ORAL | Status: DC
  Administered 2013-07-07 – 2013-07-08 (×2): 100 mg via ORAL
  Filled 2013-07-07 (×3): qty 1

## 2013-07-07 MED ORDER — INSULIN ASPART 100 UNIT/ML ~~LOC~~ SOLN
0.0000 [IU] | Freq: Three times a day (TID) | SUBCUTANEOUS | Status: DC
  Administered 2013-07-08: 3 [IU] via SUBCUTANEOUS

## 2013-07-07 MED ORDER — FLUTICASONE PROPIONATE 50 MCG/ACT NA SUSP
2.0000 | Freq: Every day | NASAL | Status: DC
  Administered 2013-07-07 – 2013-07-08 (×2): 2 via NASAL
  Filled 2013-07-07 (×2): qty 16

## 2013-07-07 MED ORDER — CLONAZEPAM 1 MG PO TABS: 2.0000 mg | ORAL_TABLET | Freq: Three times a day (TID) | ORAL | Status: DC | PRN

## 2013-07-07 MED ORDER — ASPIRIN 81 MG PO TBEC: 81.0000 mg | DELAYED_RELEASE_TABLET | Freq: Every day | ORAL | Status: DC

## 2013-07-07 MED ORDER — MORPHINE SULFATE 4 MG/ML IJ SOLN
4.0000 mg | Freq: Once | INTRAMUSCULAR | Status: AC
  Administered 2013-07-07: 4 mg via INTRAVENOUS
  Filled 2013-07-07: qty 1

## 2013-07-07 MED ORDER — IOHEXOL 350 MG/ML SOLN
100.0000 mL | Freq: Once | INTRAVENOUS | Status: AC | PRN
  Administered 2013-07-07: 100 mL via INTRAVENOUS

## 2013-07-07 MED ORDER — ATORVASTATIN CALCIUM 40 MG PO TABS
40.0000 mg | ORAL_TABLET | Freq: Every day | ORAL | Status: DC
  Administered 2013-07-07: 40 mg via ORAL
  Filled 2013-07-07 (×2): qty 1

## 2013-07-07 NOTE — Progress Notes (Signed)
Patient ID: Casey Sanders, female   DOB: 1961-09-15, 52 y.o.   MRN: [id number] SUBJECTIVE: CC: Chief Complaint  Patient presents with  . Chest Pain    STARTED SUNDAY    HPI: Severe mid-chest and upper chest pain. Sharp and  Severe 10/10. Since the last 4 days. Comes and goes. Unrelated to meals. Patient is a former smoker with multiple medical problems and DM neuropathy. Pain radiates to the left arm and between the shoulder blades and she feels SOB even though the pulse oximetry is 99%. She had postponed getting checked and didn't want to go to the ED the last few days but today the pain was really bad. Dr Antoine Poche is her Cardiologist  Past Medical History  Diagnosis Date  . Palpitations     monitor 9/05: NSR, sinus tachy, PVCs  . Chest pain     a. Myoview 9/05: no ischemia, no scar, EF 70%  . DM2 (diabetes mellitus, type 2)   . HLD (hyperlipidemia)   . HTN (hypertension)   . GERD (gastroesophageal reflux disease)     Hiatal Hernia  . Asthma   . Diverticulosis   . Obesity   . Lumbar disc disease   . Anxiety and depression   . Allergic rhinitis   . Nephrolithiasis   . Frequent UTI   . Left rotator cuff tear   . Fibromyalgia    Past Surgical History  Procedure Laterality Date  . Total abdominal hysterectomy w/ bilateral salpingoophorectomy    . Cesarean section    . Foot surgery    . Tonsillectomy    . Right wrist surgery     History   Social History  . Marital Status: Married    Spouse Name: N/A    Number of Children: 2  . Years of Education: N/A   Occupational History  . disabled    Social History Main Topics  . Smoking status: Former Games developer  . Smokeless tobacco: Not on file  . Alcohol Use: No  . Drug Use: No  . Sexual Activity: Not on file   Other Topics Concern  . Not on file   Social History Narrative  . No narrative on file   Family History  Problem Relation Age of Onset  . Coronary artery disease Mother   . Coronary artery disease Father     Current Outpatient Prescriptions on File Prior to Visit  Medication Sig Dispense Refill  . albuterol (PROVENTIL HFA;VENTOLIN HFA) 108 (90 BASE) MCG/ACT inhaler Inhale 2 puffs into the lungs. 2puffs q 8 hr      . amitriptyline (ELAVIL) 25 MG tablet Take 50 mg by mouth at bedtime.       Marland Kitchen aspirin 81 MG EC tablet Take 81 mg by mouth daily.        . clonazePAM (KLONOPIN) 2 MG tablet Take 2 mg by mouth 5 (five) times daily.        . cyclobenzaprine (FLEXERIL) 10 MG tablet Take 10 mg by mouth as directed.        Marland Kitchen exenatide (BYETTA) 10 MCG/0.04ML SOLN Inject into the skin 2 (two) times daily with a meal.        . famotidine (PEPCID) 20 MG tablet Take 1 tablet (20 mg total) by mouth 2 (two) times daily.  60 tablet  0  . Fenofibrate (LIPOFEN) 150 MG CAPS Take 1 capsule (150 mg total) by mouth daily.  35 each  0  . fentaNYL (DURAGESIC - DOSED MCG/HR) 25 MCG/HR Place  1 patch onto the skin every 3 (three) days.        . fexofenadine (ALLEGRA) 60 MG tablet Take 60 mg by mouth 2 (two) times daily.        . fish oil-omega-3 fatty acids 1000 MG capsule Take 2 g by mouth daily.        . fluticasone (FLONASE) 50 MCG/ACT nasal spray 2 sprays by Nasal route daily. 1 spray each nostril qd       . Fluticasone-Salmeterol (ADVAIR DISKUS) 250-50 MCG/DOSE AEPB Inhale 1 puff into the lungs every 12 (twelve) hours.        . gabapentin (NEURONTIN) 800 MG tablet Take 800 mg by mouth 4 (four) times daily.       Marland Kitchen glimepiride (AMARYL) 2 MG tablet In the morning and 2 tablets in the pm  90 tablet  5  . lidocaine (LIDODERM) 5 % Place 1 patch onto the skin daily. Remove & Discard patch within 12 hours or as directed by MD       . lisinopril (PRINIVIL,ZESTRIL) 10 MG tablet Take 1 tablet (10 mg total) by mouth daily.  30 tablet  5  . methocarbamol (ROBAXIN) 500 MG tablet Take 500 mg by mouth as needed.       . Multiple Vitamin (MULTIVITAMIN PO) Take by mouth daily.        . nitrofurantoin (MACRODANTIN) 100 MG capsule Take 100  mg by mouth daily.        . ONE TOUCH ULTRA TEST test strip       . rosuvastatin (CRESTOR) 20 MG tablet Take 1 tablet (20 mg total) by mouth daily.  28 tablet  0  . sertraline (ZOLOFT) 100 MG tablet Take 100 mg by mouth 2 (two) times daily.       . Vitamin D, Ergocalciferol, (DRISDOL) 50000 UNITS CAPS 50,000 Units every 7 (seven) days.        No current facility-administered medications on file prior to visit.   Allergies  Allergen Reactions  . Codeine Other (See Comments)    unknown  . Niaspan [Niacin Er] Other (See Comments)    Increase blood glucose   Immunization History  Administered Date(s) Administered  . Influenza Whole 10/20/2006  . Td 10/20/2002   Prior to Admission medications   Medication Sig Start Date End Date Taking? Authorizing Provider  albuterol (PROVENTIL HFA;VENTOLIN HFA) 108 (90 BASE) MCG/ACT inhaler Inhale 2 puffs into the lungs. 2puffs q 8 hr    Historical Provider, MD  amitriptyline (ELAVIL) 25 MG tablet Take 50 mg by mouth at bedtime.  03/03/13   Historical Provider, MD  aspirin 81 MG EC tablet Take 81 mg by mouth daily.      Historical Provider, MD  clonazePAM (KLONOPIN) 2 MG tablet Take 2 mg by mouth 5 (five) times daily.      Historical Provider, MD  cyclobenzaprine (FLEXERIL) 10 MG tablet Take 10 mg by mouth as directed.      Historical Provider, MD  exenatide (BYETTA) 10 MCG/0.04ML SOLN Inject into the skin 2 (two) times daily with a meal.      Historical Provider, MD  famotidine (PEPCID) 20 MG tablet Take 1 tablet (20 mg total) by mouth 2 (two) times daily. 07/22/12   Scott Moishe Spice, PA-C  Fenofibrate (LIPOFEN) 150 MG CAPS Take 1 capsule (150 mg total) by mouth daily. 06/08/13   Tammy Eckard, PHARMD  fentaNYL (DURAGESIC - DOSED MCG/HR) 25 MCG/HR Place 1 patch onto the skin every  3 (three) days.      Historical Provider, MD  fexofenadine (ALLEGRA) 60 MG tablet Take 60 mg by mouth 2 (two) times daily.      Historical Provider, MD  fish oil-omega-3 fatty acids  1000 MG capsule Take 2 g by mouth daily.      Historical Provider, MD  fluticasone (FLONASE) 50 MCG/ACT nasal spray 2 sprays by Nasal route daily. 1 spray each nostril qd     Historical Provider, MD  Fluticasone-Salmeterol (ADVAIR DISKUS) 250-50 MCG/DOSE AEPB Inhale 1 puff into the lungs every 12 (twelve) hours.      Historical Provider, MD  gabapentin (NEURONTIN) 800 MG tablet Take 800 mg by mouth 4 (four) times daily.     Historical Provider, MD  glimepiride (AMARYL) 2 MG tablet In the morning and 2 tablets in the pm 03/17/13   Ileana Ladd, MD  lidocaine (LIDODERM) 5 % Place 1 patch onto the skin daily. Remove & Discard patch within 12 hours or as directed by MD     Historical Provider, MD  lisinopril (PRINIVIL,ZESTRIL) 10 MG tablet Take 1 tablet (10 mg total) by mouth daily. 06/15/13   Ileana Ladd, MD  methocarbamol (ROBAXIN) 500 MG tablet Take 500 mg by mouth as needed.  12/22/12   Historical Provider, MD  Multiple Vitamin (MULTIVITAMIN PO) Take by mouth daily.      Historical Provider, MD  nitrofurantoin (MACRODANTIN) 100 MG capsule Take 100 mg by mouth daily.      Historical Provider, MD  ONE TOUCH ULTRA TEST test strip  12/28/12   Historical Provider, MD  rosuvastatin (CRESTOR) 20 MG tablet Take 1 tablet (20 mg total) by mouth daily. 06/08/13   Tammy Eckard, PHARMD  sertraline (ZOLOFT) 100 MG tablet Take 100 mg by mouth 2 (two) times daily.  12/28/12   Historical Provider, MD  Vitamin D, Ergocalciferol, (DRISDOL) 50000 UNITS CAPS 50,000 Units every 7 (seven) days.  02/19/13   Historical Provider, MD      ROS: As above in the HPI. All other systems are stable or negative.  OBJECTIVE: APPEARANCE:  Patient in no acute distress.The patient appeared well nourished and normally developed. Acyanotic. Waist: VITAL SIGNS:BP 120/84  Pulse 107  Temp(Src) 97.9 F (36.6 C) (Oral)  SpO2 99% WF Mildly tachypneic. Obese  SKIN: warm and  Dry without overt rashes, tattoos and scars  HEAD and  Neck: without JVD, Head and scalp: normal Eyes:No scleral icterus. Fundi normal, eye movements normal. Ears: Auricle normal, canal normal, Tympanic membranes normal, insufflation normal. Nose: normal Throat: normal Neck & thyroid: normal  CHEST & LUNGS: Chest wall: mild tenderness the left upper costochondral junction. Lungs: Clear Patient tachypneic  CVS: Reveals the PMI to be normally located. Regular rhythm, First and Second Heart sounds are normal,  absence of murmurs, rubs or gallops. Peripheral vasculature: Radial pulses: normal  ABDOMEN:  Appearance: Obese Benign, no organomegaly, no masses, no Abdominal Aortic enlargement. No Guarding , no rebound. No Bruits. Bowel sounds: normal  RECTAL: N/A GU: N/A  EXTREMETIES: nonedematous.  MUSCULOSKELETAL:  Spine: normal Joints: intact  NEUROLOGIC: oriented to time,place and person; nonfocal.  Results for orders placed in visit on 06/15/13  CMP14+EGFR      Result Value Range   Glucose 96  65 - 99 mg/dL   BUN 18  6 - 24 mg/dL   Creatinine, Ser 1.61  0.57 - 1.00 mg/dL   GFR calc non Af Amer 80  >59 mL/min/1.73   GFR calc  Af Amer 92  >59 mL/min/1.73   BUN/Creatinine Ratio 22  9 - 23   Sodium 142  134 - 144 mmol/L   Potassium 4.3  3.5 - 5.2 mmol/L   Chloride 99  97 - 108 mmol/L   CO2 28  18 - 29 mmol/L   Calcium 10.1  8.7 - 10.2 mg/dL   Total Protein 7.3  6.0 - 8.5 g/dL   Albumin 4.7  3.5 - 5.5 g/dL   Globulin, Total 2.6  1.5 - 4.5 g/dL   Albumin/Globulin Ratio 1.8  1.1 - 2.5   Total Bilirubin 0.5  0.0 - 1.2 mg/dL   Alkaline Phosphatase 47  39 - 117 IU/L   AST 29  0 - 40 IU/L   ALT 29  0 - 32 IU/L  LIPID PANEL      Result Value Range   Cholesterol, Total 141  100 - 199 mg/dL   Triglycerides 308 (*) 0 - 149 mg/dL   HDL 40  >65 mg/dL   VLDL Cholesterol Cal 36  5 - 40 mg/dL   LDL Calculated 65  0 - 99 mg/dL   Chol/HDL Ratio 3.5  0.0 - 4.4 ratio units  VITAMIN D 25 HYDROXY      Result Value Range   Vit D,  25-Hydroxy 22.5 (*) 30.0 - 100.0 ng/mL  POCT GLYCOSYLATED HEMOGLOBIN (HGB A1C)      Result Value Range   Hemoglobin A1C 6.8 %      ASSESSMENT: Chest pain - Plan: EKG 12-Lead, 0.9 %  sodium chloride infusion  Benign essential HTN  Diabetes mellitus  Diabetic neuropathy, type II diabetes mellitus  Hyperlipidemia  Palpitations  Chronic pain  Chest pain, unspecified  PLAN: Orders Placed This Encounter  Procedures  . EKG 12-Lead   4 baby aspirins given.  EKG shows SR/sinus tachycardia.  In view of patient's multiple  Medical problems, including, DM, DM neuropathy, and on a fentanyl patch and in severe Chest pain. Patient transferred to [facility] ED for further evaluation and treatment.  EMS here to transport.  O2 via [place].2L/min IV started.N/S 1 liter KVO Charge nurse in the ED informed.  Follow up pending ED evaluation and treatment.  Ione Sandusky P. Modesto Charon, M.D.

## 2013-07-07 NOTE — Consult Note (Signed)
Cardiology Consult Note   Patient ID: Casey Sanders MRN: [id number], DOB/AGE: 1961-10-19   Admit date: 07/07/2013 Date of Consult: 07/07/2013  Primary Physician: Redmond Baseman, MD Primary Cardiologist: Shela Commons. Leeba Barbe, MD  Reason for consult: chest pain  HPI: Casey Sanders is a 52 y.o. female w/ no prior cardiac history. She has a PMHx s/f type 2 DM, HTN, HLD, family h/o premature CAD, GERD, asthma, fibromyalgia, obesity, anxiety and chronic pain who was admitted by the medicine teaching service for chest pain.   She was seen by Dr. Antoine Poche in 2005 for dyspnea. A Myoview study at that time was negative for ischemia, LV function normal. BNP normal. Holter monitoring was performed for palpitations which revealed sinus tachycardia and PVCs. She followed up in our office again 01/2011 for pre-op surgical clearance to treat an obstructive kidney stone. She was noted to have chronic pain and endorsed intermittent left-sided pain occurring at rest with associated shortness of breath. She endorsed dyspnea with any activity and had a history of syncope, the description of which suggested an asthma attack (? Hyperventilation). Serial EKG review indicated no ischemia changes. She was started on a H2 blocker for suspected reflux. D-dimer was drawn d/t sinus tach in the office and returned WNL. A pre-op Lexiscan Myoview was performed revealing no evidence of ischemia, EF 67%. She was cleared for surgery.   She reports experiencing intermittent, sharp left-sided chest pain radiating to her arm aggravated by inspiration, palpation and position changes over the past week. No alleviation with sitting forward. She notes associated shortness of breath. These episodes increased in frequency and severity thus prompting her ED presentation. She notes R>L leg swelling. No PND, orthopnea, weight gain, abdominal distention, SOB/DOE or syncope. She notes persistent palpitations. No increased stress at home. No recent URI  type symptoms- cough, fevers, chills, congestion, sore throat. No sick contacts. No recent strain, trauma, over-exertion. She has had chicken pox as a child.   In the ED, EKG revealed NSR w/o ischemic changes. POC TnI WNL. CXR- bibasilar atelectasis vs scarring. CT-A w/o evidence of PE, mild vascular congestion, dependent atelectasis. BMET/CBC largely WNL. Cardiology has been consulted for further management.   Problem List: Past Medical History  Diagnosis Date  . Palpitations     monitor 9/05: NSR, sinus tachy, PVCs  . Chest pain     a. Myoview 9/05: no ischemia, no scar, EF 70%  . DM2 (diabetes mellitus, type 2)   . HLD (hyperlipidemia)   . HTN (hypertension)   . GERD (gastroesophageal reflux disease)     Hiatal Hernia  . Asthma   . Diverticulosis   . Obesity   . Lumbar disc disease   . Anxiety and depression   . Allergic rhinitis   . Nephrolithiasis   . Frequent UTI   . Left rotator cuff tear   . Fibromyalgia     Past Surgical History  Procedure Laterality Date  . Total abdominal hysterectomy w/ bilateral salpingoophorectomy    . Cesarean section    . Foot surgery    . Tonsillectomy    . Right wrist surgery       Allergies:  Allergies  Allergen Reactions  . Codeine Other (See Comments)    unknown  . Niaspan [Niacin Er] Other (See Comments)    Increase blood glucose    Home Medications: Prior to Admission medications   Medication Sig Start Date End Date Taking? Authorizing Provider  albuterol (PROVENTIL HFA;VENTOLIN HFA) 108 (90 BASE) MCG/ACT inhaler Inhale  2 puffs into the lungs every 8 (eight) hours as needed for wheezing or shortness of breath.    Yes Historical Provider, MD  amitriptyline (ELAVIL) 25 MG tablet Take 50 mg by mouth at bedtime.  03/03/13  Yes Historical Provider, MD  aspirin 81 MG EC tablet Take 81 mg by mouth daily.     Yes Historical Provider, MD  clonazePAM (KLONOPIN) 2 MG tablet Take 2 mg by mouth 5 (five) times daily.     Yes Historical  Provider, MD  cyclobenzaprine (FLEXERIL) 10 MG tablet Take 10 mg by mouth 3 (three) times daily as needed for muscle spasms.    Yes Historical Provider, MD  exenatide (BYETTA) 10 MCG/0.04ML SOLN Inject 10 mcg into the skin 2 (two) times daily with a meal.    Yes Historical Provider, MD  famotidine (PEPCID) 20 MG tablet Take 1 tablet (20 mg total) by mouth 2 (two) times daily. 07/22/12  Yes Scott T Alben Spittle, PA-C  Fenofibrate (LIPOFEN) 150 MG CAPS Take 1 capsule (150 mg total) by mouth daily. 06/08/13  Yes Tammy Eckard, PHARMD  fentaNYL (DURAGESIC - DOSED MCG/HR) 25 MCG/HR Place 1 patch onto the skin daily.    Yes Historical Provider, MD  fexofenadine (ALLEGRA) 60 MG tablet Take 60 mg by mouth 2 (two) times daily.     Yes Historical Provider, MD  fish oil-omega-3 fatty acids 1000 MG capsule Take 2 g by mouth daily.     Yes Historical Provider, MD  fluticasone (FLONASE) 50 MCG/ACT nasal spray 2 sprays by Nasal route daily. 1 spray each nostril qd    Yes Historical Provider, MD  Fluticasone-Salmeterol (ADVAIR DISKUS) 250-50 MCG/DOSE AEPB Inhale 1 puff into the lungs every 12 (twelve) hours.     Yes Historical Provider, MD  gabapentin (NEURONTIN) 800 MG tablet Take 800 mg by mouth 4 (four) times daily.    Yes Historical Provider, MD  glimepiride (AMARYL) 2 MG tablet Take 2-4 mg by mouth 2 (two) times daily. Take 1 tablet in AM and 2 tablets in PM   Yes Historical Provider, MD  lidocaine (LIDODERM) 5 % Place 1 patch onto the skin daily. Remove & Discard patch within 12 hours or as directed by MD    Yes Historical Provider, MD  lisinopril (PRINIVIL,ZESTRIL) 10 MG tablet Take 1 tablet (10 mg total) by mouth daily. 06/15/13  Yes Ileana Ladd, MD  methocarbamol (ROBAXIN) 500 MG tablet Take 500 mg by mouth 3 (three) times daily as needed (for muscle spasms).  12/22/12  Yes Historical Provider, MD  Multiple Vitamin (MULTIVITAMIN WITH MINERALS) TABS tablet Take 1 tablet by mouth daily.   Yes Historical Provider, MD    ONE TOUCH ULTRA TEST test strip  12/28/12  Yes Historical Provider, MD  rosuvastatin (CRESTOR) 20 MG tablet Take 1 tablet (20 mg total) by mouth daily. 06/08/13  Yes Tammy Eckard, PHARMD  sertraline (ZOLOFT) 100 MG tablet Take 100 mg by mouth 2 (two) times daily.  12/28/12  Yes Historical Provider, MD  Vitamin D, Ergocalciferol, (DRISDOL) 50000 UNITS CAPS 50,000 Units every 7 (seven) days.  02/19/13  Yes Historical Provider, MD    Inpatient Medications:     (Not in a hospital admission)  Family History  Problem Relation Age of Onset  . Coronary artery disease Mother   . Coronary artery disease Father      History   Social History  . Marital Status: Married    Spouse Name: N/A    Number of Children:  2  . Years of Education: N/A   Occupational History  . disabled    Social History Main Topics  . Smoking status: Former Games developer  . Smokeless tobacco: Not on file  . Alcohol Use: No  . Drug Use: No  . Sexual Activity: Not on file   Other Topics Concern  . Not on file   Social History Narrative  . No narrative on file     Review of Systems: General: positive for fatigue, negative for chills, fever, night sweats or weight changes.  Cardiovascular: positive for chest pain, shortness of breath, dyspnea on exertion, edema, orthopnea, palpitations, paroxysmal nocturnal dyspnea Dermatological: negative for rash Respiratory: negative for cough or wheezing Urologic: negative for hematuria Abdominal: negative for nausea, vomiting, diarrhea, bright red blood per rectum, melena, or hematemesis Neurologic: negative for visual changes, syncope, or dizziness All other systems reviewed and are otherwise negative except as noted above.  Physical Exam: Blood pressure 96/47, pulse 78, temperature 98.2 F (36.8 C), temperature source Oral, resp. rate 11, height 5\' 1"  (1.549 m), weight 95.255 kg (210 lb), SpO2 91.00%.   General: Well developed, well nourished, in no acute distress. Head:  Normocephalic, atraumatic, sclera non-icteric, no xanthomas, nares are without discharge.  Neck: Negative for carotid bruits. JVD not elevated. Lungs: Clear bilaterally to auscultation without wheezes, rales, or rhonchi. Breathing is unlabored. Heart: Positive chest wall tenderness to palpation, RRR with S1 S2. No murmurs, rubs, or gallops appreciated. Abdomen: Soft, non-tender, non-distended with normoactive bowel sounds. No hepatomegaly. No rebound/guarding. No obvious abdominal masses. Msk:  Strength and tone appears normal for age. Extremities: No clubbing, cyanosis or edema.  Distal pedal pulses are 2+ and equal bilaterally. Neuro: Alert and oriented X 3. Moves all extremities spontaneously. Psych:  Responds to questions appropriately with a normal affect.  Labs: Recent Labs     07/07/13  1200  WBC  7.6  HGB  14.3  HCT  41.1  MCV  82.7  PLT  204   Recent Labs Lab 07/07/13 1200  NA 138  K 3.7  CL 101  CO2 26  BUN 19  CREATININE 0.82  CALCIUM 9.8  GLUCOSE 191*   Radiology/Studies: Dg Chest 2 View  07/07/2013   CLINICAL DATA:  Chest pain, shortness of breath, cough.  EXAM: CHEST  2 VIEW  COMPARISON:  None.  FINDINGS: Minimal linear densities in both lung bases, scarring versus atelectasis. Heart is upper limits normal in size. No acute opacities or effusions. No acute bony abnormality.  IMPRESSION: Bibasilar atelectasis or scarring.   Electronically Signed   By: Charlett Nose M.D.   On: 07/07/2013 12:08   Ct Angio Chest W/cm &/or Wo Cm  07/07/2013   CLINICAL DATA:  Left upper chest pain. Left arm pain.  EXAM: CT ANGIOGRAPHY CHEST WITH CONTRAST  TECHNIQUE: Multidetector CT imaging of the chest was performed using the standard protocol during bolus administration of intravenous contrast. Multiplanar CT image reconstructions including MIPs were obtained to evaluate the vascular anatomy.  CONTRAST:  OMNIPAQUE IOHEXOL 350 MG/ML SOLN  COMPARISON:  None.  FINDINGS: No filling  defects in the pulmonary arteries to suggest pulmonary emboli. Dependent atelectasis in the lungs bilaterally. Heart is borderline in size. Mild vascular congestion. No pleural effusions.  No mediastinal, hilar, or axillary adenopathy. Chest wall soft tissues unremarkable. Imaging into the upper abdomen shows no acute findings. No acute bony abnormality.  Review of the MIP images confirms the above findings.  IMPRESSION: No evidence of  pulmonary embolus.  Mild vascular congestion.  Dependent atelectasis.   Electronically Signed   By: Charlett Nose M.D.   On: 07/07/2013 16:05    EKG: NSR, 78 bpm, no ST/T changes  ASSESSMENT AND PLAN:   52 y.o. female w/ no prior cardiac history. She has a PMHx s/f type 2 DM, HTN, HLD, family h/o premature CAD, GERD, asthma, fibromyalgia, obesity, anxiety and chronic pain who was admitted by the medicine teaching service for chest pain.   1. Atypical chest pain 2. Type 2 DM 3. Hypertension 4. Hyperlipidemia 5. GERD   The patient presents with intermittent, sharp, left-sided chest pain over the past week, aggravated by inspiration, position changes and coughing. No recent URI symptoms, trauma, strain or over exertion. No alleviation leaning forward. Objectively, EKG indicates no ST elevations/PR depressions or ischemic changes. TnI x 1 WNL. CT-A w/o evidence of PE. CXR w/o acute cardiopulmonary disease. There is chest wall tenderness on palpation on exam. Position changes visibly induce the discomfort. Suspect soft tissue inflammatory etiologies such as costochondritis. Recommend supportive care, NSAIDs + PPI for GI protection. Anxiety and chronic pain may also be contributing/perpetuating the discomfort. Continue primary prevention strategies and risk reduction for CAD given significant family history. Continue ASA, ACEi, statin.    Signed, R. Hurman Horn, PA-C 07/07/2013, 7:11 PM   History and all data above reviewed.  Patient examined.  I agree with the  findings as above. Her pain is atypical.  It is worse with movement.  It is worse with inspiration.   The patient exam reveals COR:RRR  ,  Lungs: Clear  ,  Abd: Positive bowel sounds, no rebound no guarding , Ext No edema  .  All available labs, radiology testing, previous records reviewed. Agree with documented assessment and plan. Her pain is very atypical.  It is likely musculoskeletal.  If enzymes are negative no further cardiac work up will be indicated.   Fayrene Fearing Ciin Brazzel  8:21 PM  07/07/2013

## 2013-07-07 NOTE — ED Notes (Signed)
Pt arrives to ed via [facility] c/o cp (9/10) onset last night.  Pt sts hx of "palpitations", no cath or cabg.  Pt given 10mg  morphine and 3-o.4 SL nitro PTA with little relief.  Pt caox4, pmsx4, nad.

## 2013-07-07 NOTE — ED Notes (Signed)
Ordered Heart Healthy diet  

## 2013-07-07 NOTE — ED Provider Notes (Signed)
4:00 PM  Assumed care from Dr. Wilkie Aye.  Pt is a 52 y.o. female with a history of diabetes and hyperlipidemia who presents emergency department with intermittent chest pain since Monday, 3 days ago. She states it is worse with exertion and deep inspiration. She has had associated shortness of breath, diaphoresis, nausea and dizziness. She denies a prior history of cardiac catheterization or MI. She has a very significant family history for cardiac disease. She has had 3 brothers with cardiac disease, 2 in their early 47s with either fatal MI or CABG.   She reports her last stress was several years ago. She is followed by [facility] cardiology and her PCP is Dr. Modesto Charon with [facility]. Patient's EKG shows no ischemic changes. She is currently chest pain-free. First troponin negative. CT of her chest shows no pulmonary embolus. Plan was to admit patient for chest pain rule out. Patient is hemodynamically stable.  Layla Maw Candra Wegner, DO 07/07/13 2256

## 2013-07-07 NOTE — ED Provider Notes (Signed)
CSN: [id number]     Arrival date & time 07/07/13  1051 History   First MD Initiated Contact with Patient 07/07/13 1053     Chief Complaint  Patient presents with  . Chest Pain   (Consider location/radiation/quality/duration/timing/severity/associated sxs/prior Treatment) HPI This is a 31 are old female with history of diabetes, hyperlipidemia who presents with chest pain. The patient reports chest pain since Monday. She states the pain comes and goes. It is worse with ambulation and with movement. She reports that it is sharp and radiates down her left arm. She also endorses shortness of breath. Currently her pain is 6/10. She endorses worsening shortness of breath with inspiration. Patient endorses left lower ext swelling but states "I have a mass there."  Patient presented by EMS. She was given nitroglycerin in route without relief of her pain. Patient denies any headache, abdominal pain coming urinary symptoms, focal weakness or numbness.    Past Medical History  Diagnosis Date  . Palpitations     monitor 9/05: NSR, sinus tachy, PVCs  . Chest pain     a. Myoview 9/05: no ischemia, no scar, EF 70%  . DM2 (diabetes mellitus, type 2)   . HLD (hyperlipidemia)   . HTN (hypertension)   . GERD (gastroesophageal reflux disease)     Hiatal Hernia  . Asthma   . Diverticulosis   . Obesity   . Lumbar disc disease   . Anxiety and depression   . Allergic rhinitis   . Nephrolithiasis   . Frequent UTI   . Left rotator cuff tear   . Fibromyalgia    Past Surgical History  Procedure Laterality Date  . Total abdominal hysterectomy w/ bilateral salpingoophorectomy    . Cesarean section    . Foot surgery    . Tonsillectomy    . Right wrist surgery     Family History  Problem Relation Age of Onset  . Coronary artery disease Mother   . Coronary artery disease Father    History  Substance Use Topics  . Smoking status: Former Games developer  . Smokeless tobacco: Not on file  . Alcohol Use: No    OB History   Grav Para Term Preterm Abortions TAB SAB Ect Mult Living                 Review of Systems  Constitutional: Negative for fever.  Respiratory: Positive for shortness of breath. Negative for cough and chest tightness.   Cardiovascular: Positive for chest pain.  Gastrointestinal: Negative for nausea, vomiting and abdominal pain.  Genitourinary: Negative for dysuria.  Musculoskeletal: Negative for back pain.  Skin: Negative for wound.  Neurological: Negative for dizziness, syncope and headaches.  Psychiatric/Behavioral: Negative for confusion.  All other systems reviewed and are negative.    Allergies  Codeine and Niaspan  Home Medications   No current outpatient prescriptions on file. BP 96/47  Pulse 78  Temp(Src) 98.2 F (36.8 C) (Oral)  Resp 11  Ht 5\' 1"  (1.549 m)  Wt 210 lb (95.255 kg)  BMI 39.7 kg/m2  SpO2 91% Physical Exam  Nursing note and vitals reviewed. Constitutional: She is oriented to person, place, and time. She appears well-developed and well-nourished. No distress.  tachypnic  HENT:  Head: Normocephalic and atraumatic.  Eyes: Pupils are equal, round, and reactive to light.  Neck: Neck supple.  Cardiovascular: Normal rate, regular rhythm and normal heart sounds.   No murmur heard. Pulmonary/Chest: Effort normal and breath sounds normal. No respiratory distress. She  has no wheezes.  Tachypnea  Abdominal: Soft. Bowel sounds are normal. There is no tenderness. There is no rebound.  Musculoskeletal: She exhibits edema.  1+ bilateral lower extremity edema  Neurological: She is alert and oriented to person, place, and time.  Skin: Skin is warm and dry.  Psychiatric: She has a normal mood and affect.    ED Course  Procedures (including critical care time) Labs Review Labs Reviewed  BASIC METABOLIC PANEL - Abnormal; Notable for the following:    Glucose, Bld 191 (*)    GFR calc non Af Amer 80 (*)    All other components within normal  limits  URINALYSIS, ROUTINE W REFLEX MICROSCOPIC - Abnormal; Notable for the following:    APPearance CLOUDY (*)    Glucose, UA 250 (*)    Nitrite POSITIVE (*)    Leukocytes, UA SMALL (*)    All other components within normal limits  URINE MICROSCOPIC-ADD ON - Abnormal; Notable for the following:    Bacteria, UA MANY (*)    All other components within normal limits  GLUCOSE, CAPILLARY - Abnormal; Notable for the following:    Glucose-Capillary 63 (*)    All other components within normal limits  GLUCOSE, CAPILLARY - Abnormal; Notable for the following:    Glucose-Capillary 119 (*)    All other components within normal limits  URINE CULTURE  CBC WITH DIFFERENTIAL  TROPONIN I  TROPONIN I  TROPONIN I  HEMOGLOBIN A1C  LIPID PANEL  POCT I-STAT TROPONIN I   Imaging Review Dg Chest 2 View  07/07/2013   CLINICAL DATA:  Chest pain, shortness of breath, cough.  EXAM: CHEST  2 VIEW  COMPARISON:  None.  FINDINGS: Minimal linear densities in both lung bases, scarring versus atelectasis. Heart is upper limits normal in size. No acute opacities or effusions. No acute bony abnormality.  IMPRESSION: Bibasilar atelectasis or scarring.   Electronically Signed   By: Charlett Nose M.D.   On: 07/07/2013 12:08   Ct Angio Chest W/cm &/or Wo Cm  07/07/2013   CLINICAL DATA:  Left upper chest pain. Left arm pain.  EXAM: CT ANGIOGRAPHY CHEST WITH CONTRAST  TECHNIQUE: Multidetector CT imaging of the chest was performed using the standard protocol during bolus administration of intravenous contrast. Multiplanar CT image reconstructions including MIPs were obtained to evaluate the vascular anatomy.  CONTRAST:  OMNIPAQUE IOHEXOL 350 MG/ML SOLN  COMPARISON:  None.  FINDINGS: No filling defects in the pulmonary arteries to suggest pulmonary emboli. Dependent atelectasis in the lungs bilaterally. Heart is borderline in size. Mild vascular congestion. No pleural effusions.  No mediastinal, hilar, or axillary  adenopathy. Chest wall soft tissues unremarkable. Imaging into the upper abdomen shows no acute findings. No acute bony abnormality.  Review of the MIP images confirms the above findings.  IMPRESSION: No evidence of pulmonary embolus.  Mild vascular congestion.  Dependent atelectasis.   Electronically Signed   By: Charlett Nose M.D.   On: 07/07/2013 16:05   EKG independently reviewed by myself: Normal sinus rhythm with a rate of 78, no evidence of ST elevation or ischemia, LAFB, no old EKG for comparison  MDM   1. Chest pain   2.  UTI  This is a 6 are old female with a history of hyperlipidemia, diabetes who presents with chest pain. The patient's chest pain is somewhat atypical in nature and has been ongoing since Monday. There is a pleuritic component. There is also an exertional component. Patient has risk  factors for ACS. She has swelling of the left lower extremity but does attribute this to a known mass.  Patient was noted to be tachycardic and tachypneic on initial evaluation.  EKG is reassuring. The chest x-ray is unremarkable. Of note, on the patient's labs she was noted to be nitrite positive in the urine. Culture was sent. Patient was given Rocephin. CTA is pending.  Patient was signed out to DR. Ward pending CTA.  Given that patient is not low risk, patient will be admitted for chest pain r/o if CTA neg.  Shon Baton, MD 07/07/13 [phone]

## 2013-07-07 NOTE — ED Notes (Signed)
Casey Sanders contacted CT- stated 30-45 min to exam.

## 2013-07-07 NOTE — H&P (Signed)
Date: 07/07/2013               Patient Name:  Casey Sanders MRN: [id number]  DOB: 04/12/1961 Age / Sex: 52 y.o., female   PCP: Ileana Ladd, MD         Medical Service: Internal Medicine Teaching Service         Attending Physician: Dr. Jonah Blue, DO    First Contact: Dr. Delane Ginger Pager: [phone]  Second Contact: Dr. Dorise Hiss Pager: [phone]       After Hours (After 5p/  First Contact Pager: [phone]  weekends / holidays): Second Contact Pager: [phone]   Chief Complaint: Chest Pain  History of Present Illness: Casey Sanders is a 52 yo CF with a PMH of DM2, HLD, HTN, GERD, anxiety/depression, chronic back pain, and fibromyalgia. She presents to the ED via EMS from her PCP, Dr. Modesto Charon, with left sided chest pain since Monday that comes and goes but since yesterday has been continuous. In his office she presented with chest pain was given 4 baby aspirin and a EKG which showed sinus tachycardia. In view of her medical problems he called EMS and she was transported to [facility]. She describes the pain as 9.5/10 sharp, radiating into her left arm and back.  She denies any associated N/V but endorses some SOB and diaphoresis. She states that moving around makes it worse and lying down makes it better. She denies any trauma or recent exercise that may have strained a muscle. She also denies any dyspepsia or GERD symptoms, although it appears from her med list that she takes pepcid. She reports having similar episodes of CP with associated palpitations and wore a holter monitor for a couple of weeks. She is followed by [facility]. She has a normal lexiscan in 2012 with normal LV function and normal wall motion with an EF of 67%. However, she is a former smoker and has a strong FH of premature CAD.   Of note, she also c/o dysuria and has a h/o frequent UTI's.  Meds: Facility-administered medications prior to admission  Medication Dose Route Frequency Provider Last Rate Last Dose  . 0.9 %  sodium chloride  infusion   Intravenous Once Ileana Ladd, MD       Prescriptions prior to admission  Medication Sig Dispense Refill  . albuterol (PROVENTIL HFA;VENTOLIN HFA) 108 (90 BASE) MCG/ACT inhaler Inhale 2 puffs into the lungs every 8 (eight) hours as needed for wheezing or shortness of breath.       Marland Kitchen amitriptyline (ELAVIL) 25 MG tablet Take 50 mg by mouth at bedtime.       Marland Kitchen aspirin 81 MG EC tablet Take 81 mg by mouth daily.        . clonazePAM (KLONOPIN) 2 MG tablet Take 2 mg by mouth 5 (five) times daily.        . cyclobenzaprine (FLEXERIL) 10 MG tablet Take 10 mg by mouth 3 (three) times daily as needed for muscle spasms.       Marland Kitchen exenatide (BYETTA) 10 MCG/0.04ML SOLN Inject 10 mcg into the skin 2 (two) times daily with a meal.       . famotidine (PEPCID) 20 MG tablet Take 1 tablet (20 mg total) by mouth 2 (two) times daily.  60 tablet  0  . Fenofibrate (LIPOFEN) 150 MG CAPS Take 1 capsule (150 mg total) by mouth daily.  35 each  0  . fentaNYL (DURAGESIC - DOSED MCG/HR) 25 MCG/HR Place 1 patch onto  the skin daily.       . fexofenadine (ALLEGRA) 60 MG tablet Take 60 mg by mouth 2 (two) times daily.        . fish oil-omega-3 fatty acids 1000 MG capsule Take 2 g by mouth daily.        . fluticasone (FLONASE) 50 MCG/ACT nasal spray 2 sprays by Nasal route daily. 1 spray each nostril qd       . Fluticasone-Salmeterol (ADVAIR DISKUS) 250-50 MCG/DOSE AEPB Inhale 1 puff into the lungs every 12 (twelve) hours.        . gabapentin (NEURONTIN) 800 MG tablet Take 800 mg by mouth 4 (four) times daily.       Marland Kitchen glimepiride (AMARYL) 2 MG tablet Take 2-4 mg by mouth 2 (two) times daily. Take 1 tablet in AM and 2 tablets in PM      . lidocaine (LIDODERM) 5 % Place 1 patch onto the skin daily. Remove & Discard patch within 12 hours or as directed by MD       . lisinopril (PRINIVIL,ZESTRIL) 10 MG tablet Take 1 tablet (10 mg total) by mouth daily.  30 tablet  5  . methocarbamol (ROBAXIN) 500 MG tablet Take 500 mg by  mouth 3 (three) times daily as needed (for muscle spasms).       . Multiple Vitamin (MULTIVITAMIN WITH MINERALS) TABS tablet Take 1 tablet by mouth daily.      . ONE TOUCH ULTRA TEST test strip       . rosuvastatin (CRESTOR) 20 MG tablet Take 1 tablet (20 mg total) by mouth daily.  28 tablet  0  . sertraline (ZOLOFT) 100 MG tablet Take 100 mg by mouth 2 (two) times daily.       . Vitamin D, Ergocalciferol, (DRISDOL) 50000 UNITS CAPS 50,000 Units every 7 (seven) days.           Current Facility-Administered Medications  Medication Dose Route Frequency Provider Last Rate Last Dose  . amitriptyline (ELAVIL) tablet 50 mg  50 mg Oral QHS Judie Bonus, MD      . aspirin EC tablet 81 mg  81 mg Oral Daily Renaee Munda, RPH   81 mg at 07/07/13 2043  . atorvastatin (LIPITOR) tablet 40 mg  40 mg Oral q1800 Judie Bonus, MD   40 mg at 07/07/13 2042  . [START ON 07/08/2013] cefTRIAXone (ROCEPHIN) 1 g in dextrose 5 % 50 mL IVPB  1 g Intravenous Q24H Judie Bonus, MD      . clonazePAM Scarlette Calico) tablet 2 mg  2 mg Oral TID PRN Judie Bonus, MD      . cyclobenzaprine (FLEXERIL) tablet 10 mg  10 mg Oral TID PRN Judie Bonus, MD      . fenofibrate tablet 160 mg  160 mg Oral Daily Judie Bonus, MD   160 mg at 07/07/13 2041  . fluticasone (FLONASE) 50 MCG/ACT nasal spray 2 spray  2 spray Each Nare Daily Judie Bonus, MD      . gabapentin (NEURONTIN) capsule 800 mg  800 mg Oral QID Jonah Blue, DO      . heparin injection 5,000 Units  5,000 Units Subcutaneous Q8H Judie Bonus, MD      . Melene Muller ON 07/08/2013] influenza vac split quadrivalent PF (FLUARIX) injection 0.5 mL  0.5 mL Intramuscular Tomorrow-1000 Jonah Blue, DO      . [START ON 07/08/2013] insulin aspart (novoLOG) injection 0-15 Units  0-15  Units Subcutaneous TID WC Judie Bonus, MD      . lisinopril (PRINIVIL,ZESTRIL) tablet 10 mg  10 mg Oral Daily Judie Bonus, MD   10 mg at 07/07/13 2042   . mometasone-formoterol (DULERA) 100-5 MCG/ACT inhaler 2 puff  2 puff Inhalation BID Judie Bonus, MD   2 puff at 07/07/13 2043  . morphine 2 MG/ML injection 1-4 mg  1-4 mg Intravenous Q4H PRN Judie Bonus, MD   2 mg at 07/07/13 1953  . [START ON 07/08/2013] pneumococcal 23 valent vaccine (PNU-IMMUNE) injection 0.5 mL  0.5 mL Intramuscular Tomorrow-1000 Jonah Blue, DO      . sertraline (ZOLOFT) tablet 100 mg  100 mg Oral BID Judie Bonus, MD      . sodium chloride 0.9 % injection 3 mL  3 mL Intravenous Q12H Judie Bonus, MD        Allergies: Allergies as of 07/07/2013 - Review Complete 07/07/2013  Allergen Reaction Noted  . Codeine Other (See Comments)   . Niaspan [niacin er] Other (See Comments)    Past Medical History  Diagnosis Date  . Palpitations     monitor 9/05: NSR, sinus tachy, PVCs  . Chest pain     a. Myoview 9/05: no ischemia, no scar, EF 70%  . DM2 (diabetes mellitus, type 2)   . HLD (hyperlipidemia)   . HTN (hypertension)   . GERD (gastroesophageal reflux disease)     Hiatal Hernia  . Asthma   . Diverticulosis   . Obesity   . Lumbar disc disease   . Anxiety and depression   . Allergic rhinitis   . Nephrolithiasis   . Frequent UTI   . Left rotator cuff tear   . Fibromyalgia    Past Surgical History  Procedure Laterality Date  . Total abdominal hysterectomy w/ bilateral salpingoophorectomy    . Cesarean section    . Foot surgery    . Tonsillectomy    . Right wrist surgery     Family History  Problem Relation Age of Onset  . Coronary artery disease Mother   . Coronary artery disease Father    History   Social History  . Marital Status: Married    Spouse Name: N/A    Number of Children: 2  . Years of Education: N/A   Occupational History  . disabled    Social History Main Topics  . Smoking status: Former Games developer  . Smokeless tobacco: Not on file  . Alcohol Use: No  . Drug Use: No  . Sexual Activity: Not on file    Other Topics Concern  . Not on file   Social History Narrative  . No narrative on file    Review of Systems: Pertinent items are noted in HPI.  Physical Exam: Blood pressure 106/64, pulse 88, temperature 98.6 F (37 C), temperature source Oral, resp. rate 18, height 5\' 1"  (1.549 m), weight 98.884 kg (218 lb), SpO2 97.00%.  Constitutional: Vital signs reviewed.  Patient is a well-developed and well-nourished female in no acute distress and cooperative with exam although somewhat drowsy.   Head: Normocephalic and atraumatic Eyes: PERRL, EOMI, conjunctivae normal, No scleral icterus.  Neck: Supple, Trachea midline .  Cardiovascular: RRR, S1 normal, S2 normal, no MRG, pulses symmetric and intact bilaterally Pulmonary/Chest: normal respiratory effort, CTAB, no wheezes, rales, or rhonchi; left sided chest tender to palpation Abdominal: Soft. Non-tender, non-distended, bowel sounds are normal, no masses, organomegaly, or guarding present; no suprapubic tenderness.  Neurological: A&O x3, cranial nerve II-XII are grossly intact, no focal motor deficit.  Extremities: trace edema. Skin: Warm, dry and intact. Psychiatric: flat affect.    Lab results: Basic Metabolic Panel:  Recent Labs  16/10/96 1200  NA 138  K 3.7  CL 101  CO2 26  GLUCOSE 191*  BUN 19  CREATININE 0.82  CALCIUM 9.8   CBC:  Recent Labs  07/07/13 1200  WBC 7.6  NEUTROABS 4.8  HGB 14.3  HCT 41.1  MCV 82.7  PLT 204   Troponin (Point of Care Test)  Recent Labs  07/07/13 1416  TROPIPOC 0.00     Urinalysis:  Recent Labs  07/07/13 1143  COLORURINE YELLOW  LABSPEC 1.014  PHURINE 8.0  GLUCOSEU 250*  HGBUR NEGATIVE  BILIRUBINUR NEGATIVE  KETONESUR NEGATIVE  PROTEINUR NEGATIVE  UROBILINOGEN 1.0  NITRITE POSITIVE*  LEUKOCYTESUR SMALL*    Imaging results:  Dg Chest 2 View  07/07/2013   CLINICAL DATA:  Chest pain, shortness of breath, cough.  EXAM: CHEST  2 VIEW  COMPARISON:  None.   FINDINGS: Minimal linear densities in both lung bases, scarring versus atelectasis. Heart is upper limits normal in size. No acute opacities or effusions. No acute bony abnormality.  IMPRESSION: Bibasilar atelectasis or scarring.   Electronically Signed   By: Charlett Nose M.D.   On: 07/07/2013 12:08   Ct Angio Chest W/cm &/or Wo Cm  07/07/2013   CLINICAL DATA:  Left upper chest pain. Left arm pain.  EXAM: CT ANGIOGRAPHY CHEST WITH CONTRAST  TECHNIQUE: Multidetector CT imaging of the chest was performed using the standard protocol during bolus administration of intravenous contrast. Multiplanar CT image reconstructions including MIPs were obtained to evaluate the vascular anatomy.  CONTRAST:  OMNIPAQUE IOHEXOL 350 MG/ML SOLN  COMPARISON:  None.  FINDINGS: No filling defects in the pulmonary arteries to suggest pulmonary emboli. Dependent atelectasis in the lungs bilaterally. Heart is borderline in size. Mild vascular congestion. No pleural effusions.  No mediastinal, hilar, or axillary adenopathy. Chest wall soft tissues unremarkable. Imaging into the upper abdomen shows no acute findings. No acute bony abnormality.  Review of the MIP images confirms the above findings.  IMPRESSION: No evidence of pulmonary embolus.  Mild vascular congestion.  Dependent atelectasis.   Electronically Signed   By: Charlett Nose M.D.   On: 07/07/2013 16:05    Other results: ED ECG REPORT   Date: 07/07/2013  EKG Time: 11:27AM  Rate: 78   Rhythm: normal sinus rhythm  Axis: Normal  Intervals:Borderline prolonged PR interval  ST&T Change: No acute changes      Assessment & Plan by Problem:  Pt is a 52 yo CF who presents from her PCP's office with left-sided chest pain of 1 weeks duration.   Atypical Chest Pain-Pt presents with 1 week of worsening left sided chest pain. She has risk factors for ACS but her symptoms seem atypical-she has continuous pain with tenderness to palpation which is not consistent with  cardiac origin. She does have a h/o anxiety/depression which is likely playing a role and appears she is prescribed a lot of psychotropic home medications which is concerning. The  tenderness to palpation points more toward musculoskeletal causes like costochondritis. PE is unlikely as pt does not endorse pleuritic CP and VS normal. CT Angio negative for PE. Wells Score: 0. TIMI score of 1 which indicates 5% risk at 14 days of: all-cause mortality, new or recurrent MI, or severe recurrent ischemia requiring urgent revascularization.  -  Consult Cardiology -telemetry -EKG in am -troponins X 3 -LP -NTG PRN -81mg  ASA -O2 PRN  to keep O2 sat>90% -morphine 1-4mg  q4 PRN for pain  HTN-BP in ED 119/63. Continue home meds.  DM2-holding home meds. BMP glucose 191.  -SSI moderate -HA1C  UTI-UA in the ED shows positive nitrites and leukocytes. Pt endorses dysuria; no suprapubic tenderness on exam. Pt has h/o recurrent UTI.  -Urine culture -1 gram cetriaxone  Hyperlipidemia-Continue home meds.  -LP  Anxiety/Depression-Pt on many psychotropic meds; continue with exception of 2mg  klonopin 5 times daily.  -decrease klonopin to 2mg  tid  Allergic Rhinitis-Continue home meds.   Dispo: Disposition is deferred at this time, awaiting improvement of current medical problems. Anticipated discharge in approximately 1 day(s).   The patient does have a current PCP Ileana Ladd, MD) and does need an [facility] hospital follow-up appointment after discharge.   Signed: Boykin Peek, MD 07/07/2013, 9:00 PM '

## 2013-07-07 NOTE — ED Notes (Signed)
Pt given [place] sandwich and orange juice.  This RN encouraged pt to drink orange juice and eat sandwich due to CBG being 63.

## 2013-07-08 DIAGNOSIS — E785 Hyperlipidemia, unspecified: Secondary | ICD-10-CM

## 2013-07-08 DIAGNOSIS — E119 Type 2 diabetes mellitus without complications: Secondary | ICD-10-CM

## 2013-07-08 DIAGNOSIS — I10 Essential (primary) hypertension: Secondary | ICD-10-CM

## 2013-07-08 DIAGNOSIS — G8929 Other chronic pain: Secondary | ICD-10-CM

## 2013-07-08 LAB — LIPID PANEL
Cholesterol: 122 mg/dL (ref 0–200)
LDL Cholesterol: 53 mg/dL (ref 0–99)
Total CHOL/HDL Ratio: 3.2 RATIO
VLDL: 31 mg/dL (ref 0–40)

## 2013-07-08 LAB — TROPONIN I
Troponin I: <0.3 ng/mL (ref <0.30)
Troponin I: <0.3 ng/mL (ref <0.30)
Troponin I: <0.3 ng/mL (ref <0.30)

## 2013-07-08 LAB — GLUCOSE, CAPILLARY: Glucose-Capillary: 173 mg/dL — ABNORMAL HIGH (ref 70–99)

## 2013-07-08 MED ORDER — PANTOPRAZOLE SODIUM 40 MG PO TBEC
40.0000 mg | DELAYED_RELEASE_TABLET | Freq: Every day | ORAL | Status: DC
  Administered 2013-07-08: 40 mg via ORAL
  Filled 2013-07-08: qty 1

## 2013-07-08 MED ORDER — SULFAMETHOXAZOLE-TRIMETHOPRIM 800-160 MG PO TABS: 1.0000 | ORAL_TABLET | Freq: Two times a day (BID) | ORAL | Status: DC

## 2013-07-08 MED ORDER — NAPROXEN 500 MG PO TABS
500.0000 mg | ORAL_TABLET | Freq: Two times a day (BID) | ORAL | Status: DC
  Administered 2013-07-08: 500 mg via ORAL
  Filled 2013-07-08 (×3): qty 1

## 2013-07-08 NOTE — H&P (Signed)
INTERNAL MEDICINE TEACHING SERVICE Attending Admission Note  Date: 07/08/2013  Patient name: Casey Sanders  Medical record number: [id number]  Date of birth: 08/20/1961    I have seen and evaluated Casey Sanders and discussed their care with the Residency Team.  52 yr old WF w/ hx Type 2 DM, HTN, HL, GERD, anxiety, chronic back pain, fibromyalgia, who presented with CP. Her CP is very atypical.  It is reproducible with palpation.  She has ruled out for ACS.  CTA chest is negative for PE.  She feels better this morning.  She had questionable dysuria and a UA with +Nitrites and LE. I would treat her for 7 days as a complicated UTI. She is medically stable for discharge.  Jonah Blue, DO, FACP [facility] Res[facility] 07/08/2013, 11:19 AM

## 2013-07-08 NOTE — Progress Notes (Signed)
   Subjective:  Still with mild chest pain but improved; no dyspnea.   Objective:  Filed Vitals:   07/07/13 1715 07/07/13 1800 07/07/13 1910 07/08/13 0449  BP: 102/68 96/47 106/64 92/68  Pulse: 78 78 88 81  Temp:   98.6 F (37 C) 97.8 F (36.6 C)  TempSrc:   Oral Oral  Resp: 16 11 18 18   Height:   5\' 1"  (1.549 m)   Weight:   218 lb (98.884 kg)   SpO2: 94% 91% 97% 98%      Physical Exam: Physical exam: Well-developed well-nourished in no acute distress.  Skin is warm and dry.  HEENT is normal.  Neck is supple.  Chest is clear to auscultation with normal expansion.  Cardiovascular exam is regular rate and rhythm.  Abdominal exam nontender or distended. No masses palpated. Extremities show no edema. neuro grossly intact    Lab Results: Basic Metabolic Panel:  Recent Labs  56/21/30 1200  NA 138  K 3.7  CL 101  CO2 26  GLUCOSE 191*  BUN 19  CREATININE 0.82  CALCIUM 9.8   CBC:  Recent Labs  07/07/13 1200  WBC 7.6  NEUTROABS 4.8  HGB 14.3  HCT 41.1  MCV 82.7  PLT 204   Cardiac Enzymes:  Recent Labs  07/07/13 2300 07/08/13 0435  TROPONINI <0.30 <0.30     Assessment/Plan:  1 chest pain-symptoms are extremely atypical. They increase with certain arm movements. They have been continuous for 48 hours. Enzymes negative. Electrocardiogram shows no ST changes. CT shows no pulmonary embolus. No plans for further ischemia evaluation. Patient should followup with her primary care physician. 2 diabetes mellitus-management per primary care. 3 hypertension-continue present medications.  4 hyperlipidemia-continue statin. 5 possible urinary tract infection-management per primary care. Please call with further questions.   Olga Millers 07/08/2013, 7:52 AM

## 2013-07-08 NOTE — Discharge Summary (Signed)
Name: Kinzi Frediani MRN: [id number] DOB: [birth year] 52 y.o. PCP: Ileana Ladd, MD  Date of Admission: 07/07/2013 10:51 AM Date of Discharge: 07/08/2013 Attending Physician: Jonah Blue, DO  Discharge Diagnosis: Principal Problem:   Chest pain, unspecified Active Problems:   Diabetes mellitus   Chronic pain   Benign essential HTN   Hyperlipidemia  Discharge Medications:   Medication List    STOP taking these medications       fentaNYL 25 MCG/HR patch  Commonly known as:  DURAGESIC - dosed mcg/hr     lidocaine 5 %  Commonly known as:  LIDODERM      TAKE these medications       ADVAIR DISKUS 250-50 MCG/DOSE Aepb  Generic drug:  Fluticasone-Salmeterol  Inhale 1 puff into the lungs every 12 (twelve) hours.     albuterol 108 (90 BASE) MCG/ACT inhaler  Commonly known as:  PROVENTIL HFA;VENTOLIN HFA  Inhale 2 puffs into the lungs every 8 (eight) hours as needed for wheezing or shortness of breath.     amitriptyline 25 MG tablet  Commonly known as:  ELAVIL  Take 50 mg by mouth at bedtime.     aspirin 81 MG EC tablet  Take 81 mg by mouth daily.     clonazePAM 2 MG tablet  Commonly known as:  KLONOPIN  Take 2 mg by mouth 5 (five) times daily.     cyclobenzaprine 10 MG tablet  Commonly known as:  FLEXERIL  Take 10 mg by mouth 3 (three) times daily as needed for muscle spasms.     exenatide 10 MCG/0.04ML Sopn injection  Commonly known as:  BYETTA  Inject 10 mcg into the skin 2 (two) times daily with a meal.     famotidine 20 MG tablet  Commonly known as:  PEPCID  Take 1 tablet (20 mg total) by mouth 2 (two) times daily.     Fenofibrate 150 MG Caps  Commonly known as:  LIPOFEN  Take 1 capsule (150 mg total) by mouth daily.     fexofenadine 60 MG tablet  Commonly known as:  ALLEGRA  Take 60 mg by mouth 2 (two) times daily.     fish oil-omega-3 fatty acids 1000 MG capsule  Take 2 g by mouth daily.     fluticasone 50 MCG/ACT nasal spray  Commonly known  as:  FLONASE  2 sprays by Nasal route daily. 1 spray each nostril qd     gabapentin 800 MG tablet  Commonly known as:  NEURONTIN  Take 800 mg by mouth 4 (four) times daily.     glimepiride 2 MG tablet  Commonly known as:  AMARYL  Take 2-4 mg by mouth 2 (two) times daily. Take 1 tablet in AM and 2 tablets in PM     lisinopril 10 MG tablet  Commonly known as:  PRINIVIL,ZESTRIL  Take 1 tablet (10 mg total) by mouth daily.     methocarbamol 500 MG tablet  Commonly known as:  ROBAXIN  Take 500 mg by mouth 3 (three) times daily as needed (for muscle spasms).     multivitamin with minerals Tabs tablet  Take 1 tablet by mouth daily.     ONE TOUCH ULTRA TEST test strip  Generic drug:  glucose blood     rosuvastatin 20 MG tablet  Commonly known as:  CRESTOR  Take 1 tablet (20 mg total) by mouth daily.     sertraline 100 MG tablet  Commonly known as:  ZOLOFT  Take 100 mg by mouth 2 (two) times daily.     sulfamethoxazole-trimethoprim 800-160 MG per tablet  Commonly known as:  BACTRIM DS,SEPTRA DS  Take 1 tablet by mouth 2 (two) times daily. One po bid x 7 days     Vitamin D (Ergocalciferol) 50000 UNITS Caps capsule  Commonly known as:  DRISDOL  50,000 Units every 7 (seven) days.        Disposition and follow-up:   Ms.Shena Duck was discharged from [facility] in Good condition.  At the hospital follow up visit please address:  1.  Further episodes of chest pain  2.  Labs / imaging needed at time of follow-up: None  3.  Pending labs/ test needing follow-up: None  Follow-up Appointments:     Follow-up Information   Follow up with Redmond Baseman, MD On 07/14/2013. (2:30pm)    Specialty:  Family Medicine   Contact information:   [address] [place] [address] [phone]       Discharge Instructions: Discharge Orders   Future Appointments Provider Department Dept Phone   07/14/2013 2:40 PM Ileana Ladd, MD [facility]  FAMILY MEDICINE [phone]   10/18/2013 10:00 AM Ileana Ladd, MD [facility] FAMILY MEDICINE [phone]   Future Orders Complete By Expires   Call MD for:  difficulty breathing, headache or visual disturbances  As directed    Diet - low sodium heart healthy  As directed    Increase activity slowly  As directed       Consultations:  Cardiology   Procedures Performed:  Dg Chest 2 View  07/07/2013   CLINICAL DATA:  Chest pain, shortness of breath, cough.  EXAM: CHEST  2 VIEW  COMPARISON:  None.  FINDINGS: Minimal linear densities in both lung bases, scarring versus atelectasis. Heart is upper limits normal in size. No acute opacities or effusions. No acute bony abnormality.  IMPRESSION: Bibasilar atelectasis or scarring.   Electronically Signed   By: Charlett Nose M.D.   On: 07/07/2013 12:08   Ct Angio Chest W/cm &/or Wo Cm  07/07/2013   CLINICAL DATA:  Left upper chest pain. Left arm pain.  EXAM: CT ANGIOGRAPHY CHEST WITH CONTRAST  TECHNIQUE: Multidetector CT imaging of the chest was performed using the standard protocol during bolus administration of intravenous contrast. Multiplanar CT image reconstructions including MIPs were obtained to evaluate the vascular anatomy.  CONTRAST:  OMNIPAQUE IOHEXOL 350 MG/ML SOLN  COMPARISON:  None.  FINDINGS: No filling defects in the pulmonary arteries to suggest pulmonary emboli. Dependent atelectasis in the lungs bilaterally. Heart is borderline in size. Mild vascular congestion. No pleural effusions.  No mediastinal, hilar, or axillary adenopathy. Chest wall soft tissues unremarkable. Imaging into the upper abdomen shows no acute findings. No acute bony abnormality.  Review of the MIP images confirms the above findings.  IMPRESSION: No evidence of pulmonary embolus.  Mild vascular congestion.  Dependent atelectasis.   Electronically Signed   By: Charlett Nose M.D.   On: 07/07/2013 16:05    2D Echo: None  Cardiac Cath: None  Admission  HPI: Ms. Karim is a 52 yo CF with a PMH of DM2, HLD, HTN, GERD, anxiety/depression, chronic back pain, and fibromyalgia. She presents to the ED via EMS from her PCP, Dr. Modesto Charon, with left sided chest pain since Monday that comes and goes but since yesterday has been continuous. In his office she presented with chest pain was given 4 baby aspirin and a  EKG which showed sinus tachycardia. In view of her medical problems he called EMS and she was transported to [facility]. She describes the pain as 9.5/10 sharp, radiating into her left arm and back. She denies any associated N/V but endorses some SOB and diaphoresis. She states that moving around makes it worse and lying down makes it better. She denies any trauma or recent exercise that may have strained a muscle. She also denies any dyspepsia or GERD symptoms, although it appears from her med list that she takes pepcid. She reports having similar episodes of CP with associated palpitations and wore a holter monitor for a couple of weeks. She is followed by [facility]. She has a normal lexiscan in 2012 with normal LV function and normal wall motion with an EF of 67%. However, she is a former smoker and has a strong FH of premature CAD.    Hospital Course by problem list: Principal Problem:   Chest pain, unspecified-Pt is a 52 yo CF with a PMH of DM2, HLD, HTN, GERD, anxiety/depression, chronic back pain, and fibromyalgia who presents to the ED with chest pain since Monday. She states the pain comes and goes but has been continuous for the past day. She rates the pain as sharp, radiating to her left arm and back. She denies any N/V but reports associated diaphoresis and SOB. She states the pain is worse when moving and lying down makes it better. She denies any recent trauma or new exercises. She denies any dyspepsia or GERD symptoms. She does report having similar CP episodes in the past with associated palpitations and having to wear a holter monitor by her cardiologist,  Dr. Antoine Poche. She has a strong FH of premature CAD and is a former smoker. However, she had a normal lexiscan in 2012 with nl LVEF of 67%. Of note, she is also on a lot of psychotropic medications. Additionally, onphysical exam, she was tender to palpation on the left chest area and has TIMI score of 1 which is low risk for ACS. She has a Wells Score of 0 which suggests PE is unlikely. She subsequently had a CT angio which revealed no PE. During his hospitalization troponins X 3 were negative and CXR revealed bibasilar atelectasis or scaring. EKG revealed no acute changes. She was given NTG and morphine PRN for pain. Cardiology was consulted and suspected costochondritis given that position changes induce the pain and also some component of anxiety/chronic pain was contributory. They recommended NSAIDS and PPI for GI protection and continuing ASA, ACEi, and statin given strong FH of CAD.  Active Problems:   Urinary Tract Infection-UA showed +nitrites and leukocytes. Pt also endorsed dysuria but no suprapubic tenderness on exam. She also has a h/o UTI. Pt D/C on bactrim DS for 7 days.    Diabetes mellitus-Stable. Pt put on SSI during hospitalization. HA1C 7.4. Pt instructed to resume home medications upon D/C.    Chronic pain-Stable.    Benign essential HTN-Stable. Continued home medications.   Hyperlipidemia-Stable. LP drawn and results below. Continued home medications.    Discharge Vitals:   BP 92/68  Pulse 81  Temp(Src) 97.8 F (36.6 C) (Oral)  Resp 18  Ht 5\' 1"  (1.549 m)  Wt 98.884 kg (218 lb)  BMI 41.21 kg/m2  SpO2 98%  Discharge Labs:  Results for orders placed during the hospital encounter of 07/07/13 (from the past 24 hour(s))  URINALYSIS, ROUTINE W REFLEX MICROSCOPIC     Status: Abnormal   Collection Time  07/07/13 11:43 AM      Result Value Range   Color, Urine YELLOW  YELLOW   APPearance CLOUDY (*) CLEAR   Specific Gravity, Urine 1.014  1.005 - 1.030   pH 8.0  5.0 - 8.0    Glucose, UA 250 (*) NEGATIVE mg/dL   Hgb urine dipstick NEGATIVE  NEGATIVE   Bilirubin Urine NEGATIVE  NEGATIVE   Ketones, ur NEGATIVE  NEGATIVE mg/dL   Protein, ur NEGATIVE  NEGATIVE mg/dL   Urobilinogen, UA 1.0  0.0 - 1.0 mg/dL   Nitrite POSITIVE (*) NEGATIVE   Leukocytes, UA SMALL (*) NEGATIVE  URINE MICROSCOPIC-ADD ON     Status: Abnormal   Collection Time    07/07/13 11:43 AM      Result Value Range   Squamous Epithelial / LPF RARE  RARE   WBC, UA 7-10  <3 WBC/hpf   RBC / HPF 0-2  <3 RBC/hpf   Bacteria, UA MANY (*) RARE  CBC WITH DIFFERENTIAL     Status: None   Collection Time    07/07/13 12:00 PM      Result Value Range   WBC 7.6  4.0 - 10.5 K/uL   RBC 4.97  3.87 - 5.11 MIL/uL   Hemoglobin 14.3  12.0 - 15.0 g/dL   HCT 16.1  09.6 - 04.5 %   MCV 82.7  78.0 - 100.0 fL   MCH 28.8  26.0 - 34.0 pg   MCHC 34.8  30.0 - 36.0 g/dL   RDW 40.9  81.1 - 91.4 %   Platelets 204  150 - 400 K/uL   Neutrophils Relative % 65  43 - 77 %   Lymphocytes Relative 27  12 - 46 %   Monocytes Relative 6  3 - 12 %   Eosinophils Relative 1  0 - 5 %   Basophils Relative 1  0 - 1 %   Neutro Abs 4.8  1.7 - 7.7 K/uL   Lymphs Abs 2.1  0.7 - 4.0 K/uL   Monocytes Absolute 0.5  0.1 - 1.0 K/uL   Eosinophils Absolute 0.1  0.0 - 0.7 K/uL   Basophils Absolute 0.1  0.0 - 0.1 K/uL   Smear Review MORPHOLOGY UNREMARKABLE    BASIC METABOLIC PANEL     Status: Abnormal   Collection Time    07/07/13 12:00 PM      Result Value Range   Sodium 138  135 - 145 mEq/L   Potassium 3.7  3.5 - 5.1 mEq/L   Chloride 101  96 - 112 mEq/L   CO2 26  19 - 32 mEq/L   Glucose, Bld 191 (*) 70 - 99 mg/dL   BUN 19  6 - 23 mg/dL   Creatinine, Ser 7.82  0.50 - 1.10 mg/dL   Calcium 9.8  8.4 - 95.6 mg/dL   GFR calc non Af Amer 80 (*) >90 mL/min   GFR calc Af Amer >90  >90 mL/min  POCT I-STAT TROPONIN I     Status: None   Collection Time    07/07/13  2:16 PM      Result Value Range   Troponin i, poc 0.00  0.00 - 0.08 ng/mL    Comment 3           GLUCOSE, CAPILLARY     Status: Abnormal   Collection Time    07/07/13  6:21 PM      Result Value Range   Glucose-Capillary 63 (*) 70 - 99 mg/dL  GLUCOSE, CAPILLARY     Status: Abnormal   Collection Time    07/07/13  6:53 PM      Result Value Range   Glucose-Capillary 119 (*) 70 - 99 mg/dL  GLUCOSE, CAPILLARY     Status: Abnormal   Collection Time    07/07/13  9:09 PM      Result Value Range   Glucose-Capillary 246 (*) 70 - 99 mg/dL   Comment 1 Notify RN    TROPONIN I     Status: None   Collection Time    07/07/13 11:00 PM      Result Value Range   Troponin I <0.30  <0.30 ng/mL  TROPONIN I     Status: None   Collection Time    07/08/13  4:35 AM      Result Value Range   Troponin I <0.30  <0.30 ng/mL  LIPID PANEL     Status: Abnormal   Collection Time    07/08/13  4:35 AM      Result Value Range   Cholesterol 122  0 - 200 mg/dL   Triglycerides 454 (*) <150 mg/dL   HDL 38 (*) >09 mg/dL   Total CHOL/HDL Ratio 3.2     VLDL 31  0 - 40 mg/dL   LDL Cholesterol 53  0 - 99 mg/dL  GLUCOSE, CAPILLARY     Status: Abnormal   Collection Time    07/08/13  7:48 AM      Result Value Range   Glucose-Capillary 173 (*) 70 - 99 mg/dL   Comment 1 Documented in Chart     Comment 2 Notify RN      Signed: Boykin Peek, MD 07/08/2013, 11:00 AM   Time Spent on Discharge: 45 minutes Services Ordered on Discharge: None Equipment Ordered on Discharge: None

## 2013-07-08 NOTE — Progress Notes (Signed)
Nutrition Brief Note  Patient identified on the Malnutrition Screening Tool (MST) Report for weight loss and poor intake. Per review of usual weights below, patient has not lost a significant amount of weight. Intake of meals is great.  Wt Readings from Last 15 Encounters:  07/07/13 218 lb (98.884 kg)  06/15/13 220 lb 6.4 oz (99.973 kg)  03/17/13 213 lb 3.2 oz (96.707 kg)  02/19/11 224 lb (101.606 kg)  02/12/11 225 lb (102.059 kg)    Body mass index is 41.21 kg/(m^2). Patient meets criteria for class 3, extreme/morbid obesity based on current BMI.   Current diet order is Heart Healthy, patient is consuming approximately 100% of meals at this time. Labs and medications reviewed.   No nutrition interventions warranted at this time. If nutrition issues arise, please consult RD.   Joaquin Courts, RD, LDN, CNSC Pager [phone] After Hours Pager [phone]

## 2013-07-08 NOTE — Progress Notes (Signed)
Subjective:  Pt reports chest pain has improved this morning. No further complaints.  Objective: Vital signs in last 24 hours: Filed Vitals:   07/07/13 1715 07/07/13 1800 07/07/13 1910 07/08/13 0449  BP: 102/68 96/47 106/64 92/68  Pulse: 78 78 88 81  Temp:   98.6 F (37 C) 97.8 F (36.6 C)  TempSrc:   Oral Oral  Resp: 16 11 18 18   Height:   5\' 1"  (1.549 m)   Weight:   98.884 kg (218 lb)   SpO2: 94% 91% 97% 98%   Weight change:   Intake/Output Summary (Last 24 hours) at 07/08/13 1037 Last data filed at 07/08/13 0900  Gross per 24 hour  Intake    260 ml  Output      0 ml  Net    260 ml   Constitutional: Vital signs reviewed.  Patient is a well-developed and well-nourished female in no acute distress and cooperative with exam.  Head: Normocephalic and atraumatic Eyes: PERRL, EOMI, conjunctivae normal, No scleral icterus.  Neck: Supple, Trachea midline .  Cardiovascular: RRR, S1 normal, S2 normal, no MRG Pulmonary/Chest: normal respiratory effort, CTAB, no wheezes, rales, or rhonchi Abdominal: Soft. Non-tender, non-distended, bowel sounds are normal Neurological: A&O x3, cranial nerve II-XII are grossly intact, no focal motor deficit, sensory intact to light touch bilaterally.  Skin: Warm, dry and intact.     Lab Results: Basic Metabolic Panel:  Recent Labs Lab 07/07/13 1200  NA 138  K 3.7  CL 101  CO2 26  GLUCOSE 191*  BUN 19  CREATININE 0.82  CALCIUM 9.8   Liver Function Tests: No results found for this basename: AST, ALT, ALKPHOS, BILITOT, PROT, ALBUMIN,  in the last 168 hours No results found for this basename: LIPASE, AMYLASE,  in the last 168 hours No results found for this basename: AMMONIA,  in the last 168 hours CBC:  Recent Labs Lab 07/07/13 1200  WBC 7.6  NEUTROABS 4.8  HGB 14.3  HCT 41.1  MCV 82.7  PLT 204   Cardiac Enzymes:  Recent Labs Lab 07/07/13 2300 07/08/13 0435  TROPONINI <0.30 <0.30   BNP: No results found for this  basename: PROBNP,  in the last 168 hours D-Dimer: No results found for this basename: DDIMER,  in the last 168 hours CBG:  Recent Labs Lab 07/07/13 1821 07/07/13 1853 07/07/13 2109 07/08/13 0748  GLUCAP 63* 119* 246* 173*   Hemoglobin A1C: No results found for this basename: HGBA1C,  in the last 168 hours Fasting Lipid Panel:  Recent Labs Lab 07/08/13 0435  CHOL 122  HDL 38*  LDLCALC 53  TRIG 956*  CHOLHDL 3.2   Thyroid Function Tests: No results found for this basename: TSH, T4TOTAL, FREET4, T3FREE, THYROIDAB,  in the last 168 hours Coagulation: No results found for this basename: LABPROT, INR,  in the last 168 hours Anemia Panel: No results found for this basename: VITAMINB12, FOLATE, FERRITIN, TIBC, IRON, RETICCTPCT,  in the last 168 hours Urine Drug Screen: Drugs of Abuse  No results found for this basename: labopia, cocainscrnur, labbenz, amphetmu, thcu, labbarb    Alcohol Level: No results found for this basename: ETH,  in the last 168 hours Urinalysis:  Recent Labs Lab 07/07/13 1143  COLORURINE YELLOW  LABSPEC 1.014  PHURINE 8.0  GLUCOSEU 250*  HGBUR NEGATIVE  BILIRUBINUR NEGATIVE  KETONESUR NEGATIVE  PROTEINUR NEGATIVE  UROBILINOGEN 1.0  NITRITE POSITIVE*  LEUKOCYTESUR SMALL*   Micro Results: No results found for this or  any previous visit (from the past 240 hour(s)). Studies/Results: Dg Chest 2 View  07/07/2013   CLINICAL DATA:  Chest pain, shortness of breath, cough.  EXAM: CHEST  2 VIEW  COMPARISON:  None.  FINDINGS: Minimal linear densities in both lung bases, scarring versus atelectasis. Heart is upper limits normal in size. No acute opacities or effusions. No acute bony abnormality.  IMPRESSION: Bibasilar atelectasis or scarring.   Electronically Signed   By: Charlett Nose M.D.   On: 07/07/2013 12:08   Ct Angio Chest W/cm &/or Wo Cm  07/07/2013   CLINICAL DATA:  Left upper chest pain. Left arm pain.  EXAM: CT ANGIOGRAPHY CHEST WITH CONTRAST   TECHNIQUE: Multidetector CT imaging of the chest was performed using the standard protocol during bolus administration of intravenous contrast. Multiplanar CT image reconstructions including MIPs were obtained to evaluate the vascular anatomy.  CONTRAST:  OMNIPAQUE IOHEXOL 350 MG/ML SOLN  COMPARISON:  None.  FINDINGS: No filling defects in the pulmonary arteries to suggest pulmonary emboli. Dependent atelectasis in the lungs bilaterally. Heart is borderline in size. Mild vascular congestion. No pleural effusions.  No mediastinal, hilar, or axillary adenopathy. Chest wall soft tissues unremarkable. Imaging into the upper abdomen shows no acute findings. No acute bony abnormality.  Review of the MIP images confirms the above findings.  IMPRESSION: No evidence of pulmonary embolus.  Mild vascular congestion.  Dependent atelectasis.   Electronically Signed   By: Charlett Nose M.D.   On: 07/07/2013 16:05   Medications: I have reviewed the patient's current medications. Scheduled Meds: . amitriptyline  50 mg Oral QHS  . aspirin EC  81 mg Oral Daily  . atorvastatin  40 mg Oral q1800  . cefTRIAXone (ROCEPHIN)  IV  1 g Intravenous Q24H  . fenofibrate  160 mg Oral Daily  . fluticasone  2 spray Each Nare Daily  . gabapentin  800 mg Oral QID  . heparin  5,000 Units Subcutaneous Q8H  . influenza vac split quadrivalent PF  0.5 mL Intramuscular Tomorrow-1000  . insulin aspart  0-15 Units Subcutaneous TID WC  . lisinopril  10 mg Oral Daily  . mometasone-formoterol  2 puff Inhalation BID  . naproxen  500 mg Oral BID WC  . pantoprazole  40 mg Oral Q breakfast  . pneumococcal 23 valent vaccine  0.5 mL Intramuscular Tomorrow-1000  . sertraline  100 mg Oral BID  . sodium chloride  3 mL Intravenous Q12H   Continuous Infusions:  PRN Meds:.clonazePAM, cyclobenzaprine, morphine injection Assessment/Plan: Atypical Chest Pain-Pt presents with 1 week of worsening left sided chest pain. She has risk factors for ACS  but her symptoms seem atypical-she has continuous pain with tenderness to palpation which is not consistent with cardiac origin. She does have a h/o anxiety/depression which is likely playing a role and appears she is prescribed a lot of psychotropic home medications which is concerning. The tenderness to palpation points more toward musculoskeletal causes like costochondritis. PE is unlikely as pt does not endorse pleuritic CP and VS normal. CT Angio negative for PE. Wells Score: 0. TIMI score of 1 which indicates 5% risk at 14 days of: all-cause mortality, new or recurrent MI, or severe recurrent ischemia requiring urgent revascularization. Cardiology was consulted and thought CP was likely musculoskeletal and no further recommendations. Troponins X 3 were negative. Pt notes CP has improved this am.   HTN-BP in ED 119/63. Continue home meds.  DM2-holding home meds. BMP glucose 191.  -SSI moderate  -  HA1C pending UTI-UA in the ED shows positive nitrites and leukocytes. Pt endorses dysuria; no suprapubic tenderness on exam. Pt has h/o recurrent UTI.  -Urine culture pending -1 gram cetriaxone  Hyperlipidemia-Continue home meds. Lipid panel today reveals: 155 triglycerides and 38 HDL.  Anxiety/Depression-Pt on many psychotropic meds; continue with exception of 2mg  klonopin 5 times daily.  -decrease klonopin to 2mg  tid  Allergic Rhinitis-Continue home meds.  Dispo: Discharge today.  The patient does have a current PCP Ileana Ladd, MD) and does need an [facility] hospital follow-up appointment after discharge.   .Services Needed at time of discharge: Y = Yes, Blank = No PT:   OT:   RN:   Equipment:   Other:     LOS: 1 day   Boykin Peek, MD 07/08/2013, 10:37 AM

## 2013-07-08 NOTE — Progress Notes (Signed)
Utilization review completed.  

## 2013-07-10 LAB — URINE CULTURE: Colony Count: >=100000

## 2013-07-10 NOTE — Progress Notes (Signed)
Quick Note:  Labs abnormal. Call patient to make sure she was discharged on antibiotics for a UTI. Urine grew E Coli. From the records it appears that they did.  ______

## 2013-07-11 NOTE — Discharge Summary (Signed)
  Date: 07/11/2013  Patient name: Casey Sanders  Medical record number: [id number]  Date of birth: 13-Jan-1961   This patient has been seen and the plan of care was discussed with the house staff. Please see their note for complete details. I concur with their findings and plan.  Jonah Blue, DO, FACP Faculty [facility] Internal Medicine Residency Program 07/11/2013, 6:38 AM

## 2013-07-14 ENCOUNTER — Ambulatory Visit: Payer: Managed Care, Other (non HMO) | Admitting: Family Medicine

## 2013-07-26 ENCOUNTER — Ambulatory Visit (INDEPENDENT_AMBULATORY_CARE_PROVIDER_SITE_OTHER): Payer: Managed Care, Other (non HMO) | Admitting: Family Medicine

## 2013-07-26 ENCOUNTER — Telehealth: Payer: Self-pay | Admitting: Family Medicine

## 2013-07-26 ENCOUNTER — Encounter: Payer: Self-pay | Admitting: Family Medicine

## 2013-07-26 VITALS — BP 111/76 | HR 85 | Temp 97.6°F | Wt 222.6 lb

## 2013-07-26 DIAGNOSIS — R079 Chest pain, unspecified: Secondary | ICD-10-CM

## 2013-07-26 DIAGNOSIS — E785 Hyperlipidemia, unspecified: Secondary | ICD-10-CM

## 2013-07-26 DIAGNOSIS — I10 Essential (primary) hypertension: Secondary | ICD-10-CM

## 2013-07-26 DIAGNOSIS — E1149 Type 2 diabetes mellitus with other diabetic neurological complication: Secondary | ICD-10-CM

## 2013-07-26 DIAGNOSIS — G2581 Restless legs syndrome: Secondary | ICD-10-CM

## 2013-07-26 DIAGNOSIS — E119 Type 2 diabetes mellitus without complications: Secondary | ICD-10-CM

## 2013-07-26 DIAGNOSIS — E1140 Type 2 diabetes mellitus with diabetic neuropathy, unspecified: Secondary | ICD-10-CM

## 2013-07-26 DIAGNOSIS — R350 Frequency of micturition: Secondary | ICD-10-CM

## 2013-07-26 DIAGNOSIS — G8929 Other chronic pain: Secondary | ICD-10-CM

## 2013-07-26 DIAGNOSIS — IMO0001 Reserved for inherently not codable concepts without codable children: Secondary | ICD-10-CM

## 2013-07-26 DIAGNOSIS — E1142 Type 2 diabetes mellitus with diabetic polyneuropathy: Secondary | ICD-10-CM

## 2013-07-26 LAB — POCT UA - MICROSCOPIC ONLY
Bacteria, U Microscopic: NEGATIVE
Casts, Ur, LPF, POC: NEGATIVE
Crystals, Ur, HPF, POC: NEGATIVE
Mucus, UA: NEGATIVE
RBC, urine, microscopic: NEGATIVE
WBC, Ur, HPF, POC: NEGATIVE
Yeast, UA: NEGATIVE

## 2013-07-26 LAB — POCT URINALYSIS DIPSTICK
Bilirubin, UA: NEGATIVE
Blood, UA: NEGATIVE
Glucose, UA: NEGATIVE
Ketones, UA: NEGATIVE
Leukocytes, UA: NEGATIVE
Nitrite, UA: NEGATIVE
Protein, UA: NEGATIVE
Spec Grav, UA: 1.01
Urobilinogen, UA: NEGATIVE
pH, UA: 8

## 2013-07-26 MED ORDER — DICLOFENAC SODIUM 1 % TD GEL: 2.0000 g | Freq: Four times a day (QID) | TRANSDERMAL | Status: AC

## 2013-07-26 NOTE — Progress Notes (Signed)
Patient ID: Casey Sanders, female   DOB: [birth year], 52 y.o.   MRN: [id number] SUBJECTIVE: CC: Chief Complaint  Patient presents with  . Hospitalization Follow-up    HPI: Recently transferred by Dr Modesto Charon to [facility] for evaluation and treatment of chest pain. after evaluation and observation the conclusion was that the patient had costochondritis. Subsequently discharge for out patient follow up.  Chart reviewed.  Continues to have chest wall soreness. Intensity of the pain is less.  Past Medical History  Diagnosis Date  . Palpitations     monitor 9/05: NSR, sinus tachy, PVCs  . Chest pain     a. Myoview 9/05: no ischemia, no scar, EF 70%  . DM2 (diabetes mellitus, type 2)   . HLD (hyperlipidemia)   . HTN (hypertension)   . GERD (gastroesophageal reflux disease)     Hiatal Hernia  . Asthma   . Diverticulosis   . Obesity   . Lumbar disc disease   . Anxiety and depression   . Allergic rhinitis   . Nephrolithiasis   . Frequent UTI   . Left rotator cuff tear   . Fibromyalgia    Past Surgical History  Procedure Laterality Date  . Total abdominal hysterectomy w/ bilateral salpingoophorectomy    . Cesarean section    . Foot surgery    . Tonsillectomy    . Right wrist surgery     History   Social History  . Marital Status: Married    Spouse Name: N/A    Number of Children: 2  . Years of Education: N/A   Occupational History  . disabled    Social History Main Topics  . Smoking status: Former Games developer  . Smokeless tobacco: Not on file  . Alcohol Use: No  . Drug Use: No  . Sexual Activity: Not on file   Other Topics Concern  . Not on file   Social History Narrative  . No narrative on file   Family History  Problem Relation Age of Onset  . Coronary artery disease Mother   . Coronary artery disease Father    Current Outpatient Prescriptions on File Prior to Visit  Medication Sig Dispense Refill  . albuterol (PROVENTIL HFA;VENTOLIN HFA) 108 (90  BASE) MCG/ACT inhaler Inhale 2 puffs into the lungs every 8 (eight) hours as needed for wheezing or shortness of breath.       Marland Kitchen amitriptyline (ELAVIL) 25 MG tablet Take 50 mg by mouth at bedtime.       Marland Kitchen aspirin 81 MG EC tablet Take 81 mg by mouth daily.        . clonazePAM (KLONOPIN) 2 MG tablet Take 2 mg by mouth 5 (five) times daily.        . cyclobenzaprine (FLEXERIL) 10 MG tablet Take 10 mg by mouth 3 (three) times daily as needed for muscle spasms.       Marland Kitchen exenatide (BYETTA) 10 MCG/0.04ML SOLN Inject 10 mcg into the skin 2 (two) times daily with a meal.       . famotidine (PEPCID) 20 MG tablet Take 1 tablet (20 mg total) by mouth 2 (two) times daily.  60 tablet  0  . Fenofibrate (LIPOFEN) 150 MG CAPS Take 1 capsule (150 mg total) by mouth daily.  35 each  0  . fexofenadine (ALLEGRA) 60 MG tablet Take 60 mg by mouth 2 (two) times daily.        . fish oil-omega-3 fatty acids 1000 MG capsule Take 2  g by mouth daily.        . fluticasone (FLONASE) 50 MCG/ACT nasal spray 2 sprays by Nasal route daily. 1 spray each nostril qd       . Fluticasone-Salmeterol (ADVAIR DISKUS) 250-50 MCG/DOSE AEPB Inhale 1 puff into the lungs every 12 (twelve) hours.        . gabapentin (NEURONTIN) 800 MG tablet Take 800 mg by mouth 4 (four) times daily.       Marland Kitchen glimepiride (AMARYL) 2 MG tablet Take 2-4 mg by mouth 2 (two) times daily. Take 1 tablet in AM and 2 tablets in PM      . lisinopril (PRINIVIL,ZESTRIL) 10 MG tablet Take 1 tablet (10 mg total) by mouth daily.  30 tablet  5  . methocarbamol (ROBAXIN) 500 MG tablet Take 500 mg by mouth 3 (three) times daily as needed (for muscle spasms).       . Multiple Vitamin (MULTIVITAMIN WITH MINERALS) TABS tablet Take 1 tablet by mouth daily.      . ONE TOUCH ULTRA TEST test strip       . rosuvastatin (CRESTOR) 20 MG tablet Take 1 tablet (20 mg total) by mouth daily.  28 tablet  0  . sertraline (ZOLOFT) 100 MG tablet Take 100 mg by mouth 2 (two) times daily.       .  Vitamin D, Ergocalciferol, (DRISDOL) 50000 UNITS CAPS 50,000 Units every 7 (seven) days.        No current facility-administered medications on file prior to visit.   Allergies  Allergen Reactions  . Codeine Other (See Comments)    unknown  . Niaspan [Niacin Er] Other (See Comments)    Increase blood glucose   Immunization History  Administered Date(s) Administered  . Influenza Whole 10/20/2006  . Td 10/20/2002   Prior to Admission medications   Medication Sig Start Date End Date Taking? Authorizing Provider  albuterol (PROVENTIL HFA;VENTOLIN HFA) 108 (90 BASE) MCG/ACT inhaler Inhale 2 puffs into the lungs every 8 (eight) hours as needed for wheezing or shortness of breath.     Historical Provider, MD  amitriptyline (ELAVIL) 25 MG tablet Take 50 mg by mouth at bedtime.  03/03/13   Historical Provider, MD  aspirin 81 MG EC tablet Take 81 mg by mouth daily.      Historical Provider, MD  clonazePAM (KLONOPIN) 2 MG tablet Take 2 mg by mouth 5 (five) times daily.      Historical Provider, MD  cyclobenzaprine (FLEXERIL) 10 MG tablet Take 10 mg by mouth 3 (three) times daily as needed for muscle spasms.     Historical Provider, MD  diclofenac sodium (VOLTAREN) 1 % GEL Apply 2 g topically 4 (four) times daily. 07/26/13   Ileana Ladd, MD  exenatide (BYETTA) 10 MCG/0.04ML SOLN Inject 10 mcg into the skin 2 (two) times daily with a meal.     Historical Provider, MD  famotidine (PEPCID) 20 MG tablet Take 1 tablet (20 mg total) by mouth 2 (two) times daily. 07/22/12   Scott Moishe Spice, PA-C  Fenofibrate (LIPOFEN) 150 MG CAPS Take 1 capsule (150 mg total) by mouth daily. 06/08/13   Tammy Eckard, PHARMD  fentaNYL (DURAGESIC - DOSED MCG/HR) 50 MCG/HR  06/21/13   Historical Provider, MD  fexofenadine (ALLEGRA) 60 MG tablet Take 60 mg by mouth 2 (two) times daily.      Historical Provider, MD  fish oil-omega-3 fatty acids 1000 MG capsule Take 2 g by mouth daily.  Historical Provider, MD  fluticasone  (FLONASE) 50 MCG/ACT nasal spray 2 sprays by Nasal route daily. 1 spray each nostril qd     Historical Provider, MD  Fluticasone-Salmeterol (ADVAIR DISKUS) 250-50 MCG/DOSE AEPB Inhale 1 puff into the lungs every 12 (twelve) hours.      Historical Provider, MD  gabapentin (NEURONTIN) 800 MG tablet Take 800 mg by mouth 4 (four) times daily.     Historical Provider, MD  glimepiride (AMARYL) 2 MG tablet Take 2-4 mg by mouth 2 (two) times daily. Take 1 tablet in AM and 2 tablets in PM    Historical Provider, MD  lisinopril (PRINIVIL,ZESTRIL) 10 MG tablet Take 1 tablet (10 mg total) by mouth daily. 06/15/13   Ileana Ladd, MD  methocarbamol (ROBAXIN) 500 MG tablet Take 500 mg by mouth 3 (three) times daily as needed (for muscle spasms).  12/22/12   Historical Provider, MD  Multiple Vitamin (MULTIVITAMIN WITH MINERALS) TABS tablet Take 1 tablet by mouth daily.    Historical Provider, MD  ONE TOUCH ULTRA TEST test strip  12/28/12   Historical Provider, MD  rosuvastatin (CRESTOR) 20 MG tablet Take 1 tablet (20 mg total) by mouth daily. 06/08/13   Tammy Eckard, PHARMD  sertraline (ZOLOFT) 100 MG tablet Take 100 mg by mouth 2 (two) times daily.  12/28/12   Historical Provider, MD  Vitamin D, Ergocalciferol, (DRISDOL) 50000 UNITS CAPS 50,000 Units every 7 (seven) days.  02/19/13   Historical Provider, MD      ROS: As above in the HPI. All other systems are stable or negative.  OBJECTIVE: APPEARANCE:  Patient in no acute distress.The patient appeared well nourished and normally developed. Acyanotic. Waist: VITAL SIGNS:BP 111/76  Pulse 85  Temp(Src) 97.6 F (36.4 C) (Oral)  Wt 222 lb 9.6 oz (100.971 kg)  BMI 42.08 kg/m2 WF obese Flat facies.  SKIN: warm and  Dry without overt rashes, tattoos and scars  HEAD and Neck: without JVD, Head and scalp: normal Eyes:No scleral icterus. Fundi normal, eye movements normal. Ears: Auricle normal, canal normal, Tympanic membranes normal, insufflation  normal. Nose: normal Throat: normal Neck & thyroid: normal  CHEST & LUNGS: Chest wall: the left lower costochondral junctions were tender. Lungs: Clear  CVS: Reveals the PMI to be normally located. Regular rhythm, First and Second Heart sounds are normal,  absence of murmurs, rubs or gallops. Peripheral vasculature: Radial pulses: normal Dorsal pedis pulses: normal Posterior pulses: normal  ABDOMEN:  Appearance: obese Benign, no organomegaly, no masses, no Abdominal Aortic enlargement. No Guarding , no rebound. No Bruits. Bowel sounds: normal  RECTAL: N/A GU: N/A  EXTREMETIES: nonedematous.   NEUROLOGIC: oriented to time,place and person;   Results for orders placed in visit on 07/26/13  POCT UA - MICROSCOPIC ONLY      Result Value Range   WBC, Ur, HPF, POC neg     RBC, urine, microscopic neg     Bacteria, U Microscopic neg     Mucus, UA neg     Epithelial cells, urine per micros occ     Crystals, Ur, HPF, POC neg     Casts, Ur, LPF, POC neg     Yeast, UA neg    POCT URINALYSIS DIPSTICK      Result Value Range   Color, UA yellow     Clarity, UA clear     Glucose, UA neg     Bilirubin, UA neg     Ketones, UA neg  Spec Grav, UA 1.010     Blood, UA neg     pH, UA 8.0     Protein, UA neg     Urobilinogen, UA negative     Nitrite, UA neg     Leukocytes, UA Negative      ASSESSMENT: Frequency - Plan: POCT UA - Microscopic Only, POCT urinalysis dipstick  Chest pain, unspecified  Chronic pain  Benign essential HTN  Diabetes mellitus  Diabetic neuropathy, type II diabetes mellitus  Hyperlipidemia  Restless leg syndrome  Suspect underlying chronic depression, however she is on zoloft and fentanyl patches. Yet she continues to complain of pain.   PLAN: Orders Placed This Encounter  Procedures  . POCT UA - Microscopic Only  . POCT urinalysis dipstick    Meds ordered this encounter  Medications  . fentaNYL (DURAGESIC - DOSED MCG/HR) 50  MCG/HR    Sig:   . diclofenac sodium (VOLTAREN) 1 % GEL    Sig: Apply 2 g topically 4 (four) times daily.    Dispense:  100 g    Refill:  1   The only Rx made was the diclofenac gel. [facility]IC updated/ recorded the fentanyl patches  Ice packs for the inflammation.  Return in about 1 month (around 08/26/2013) for Recheck medical problems.  Ertha Nabor P. Modesto Charon, M.D.

## 2013-07-28 NOTE — Telephone Encounter (Signed)
Pt notified of APPT NOV 3 AT 4PM

## 2013-08-05 ENCOUNTER — Telehealth: Payer: Self-pay | Admitting: Family Medicine

## 2013-08-05 NOTE — Telephone Encounter (Signed)
Pt informed will need to check urine specimen will come in on 08-06-13

## 2013-08-08 ENCOUNTER — Telehealth: Payer: Self-pay | Admitting: Pharmacist

## 2013-08-08 DIAGNOSIS — E1169 Type 2 diabetes mellitus with other specified complication: Secondary | ICD-10-CM

## 2013-08-08 MED ORDER — ROSUVASTATIN CALCIUM 20 MG PO TABS: 20.0000 mg | ORAL_TABLET | Freq: Every day | ORAL | Status: DC

## 2013-08-08 MED ORDER — FENOFIBRATE 150 MG PO CAPS: 1.0000 | ORAL_CAPSULE | Freq: Every day | ORAL | Status: DC

## 2013-08-08 MED ORDER — LIDOCAINE 5 % EX PTCH: 3.0000 | MEDICATED_PATCH | CUTANEOUS | Status: DC

## 2013-08-08 NOTE — Telephone Encounter (Signed)
Out of crestor, lidoderm patches and lipofen - needs called into [facility].  Rx's sent in for 90 days supply / no refills.

## 2013-08-09 ENCOUNTER — Other Ambulatory Visit: Payer: Self-pay

## 2013-08-09 MED ORDER — GLUCOSE BLOOD VI STRP: ORAL_STRIP | Status: DC

## 2013-08-10 ENCOUNTER — Telehealth: Payer: Self-pay | Admitting: Family Medicine

## 2013-08-10 NOTE — Telephone Encounter (Signed)
Pt.notified

## 2013-08-22 ENCOUNTER — Encounter: Payer: Self-pay | Admitting: Family Medicine

## 2013-08-22 ENCOUNTER — Encounter (INDEPENDENT_AMBULATORY_CARE_PROVIDER_SITE_OTHER): Payer: Self-pay

## 2013-08-22 ENCOUNTER — Ambulatory Visit (INDEPENDENT_AMBULATORY_CARE_PROVIDER_SITE_OTHER): Payer: Managed Care, Other (non HMO) | Admitting: Family Medicine

## 2013-08-22 VITALS — BP 109/70 | HR 80 | Temp 97.7°F | Ht 64.0 in | Wt 220.4 lb

## 2013-08-22 DIAGNOSIS — G8929 Other chronic pain: Secondary | ICD-10-CM

## 2013-08-22 DIAGNOSIS — K625 Hemorrhage of anus and rectum: Secondary | ICD-10-CM | POA: Insufficient documentation

## 2013-08-22 DIAGNOSIS — Z1239 Encounter for other screening for malignant neoplasm of breast: Secondary | ICD-10-CM

## 2013-08-22 DIAGNOSIS — E1149 Type 2 diabetes mellitus with other diabetic neurological complication: Secondary | ICD-10-CM

## 2013-08-22 DIAGNOSIS — E1140 Type 2 diabetes mellitus with diabetic neuropathy, unspecified: Secondary | ICD-10-CM

## 2013-08-22 DIAGNOSIS — I10 Essential (primary) hypertension: Secondary | ICD-10-CM

## 2013-08-22 DIAGNOSIS — E785 Hyperlipidemia, unspecified: Secondary | ICD-10-CM

## 2013-08-22 DIAGNOSIS — C539 Malignant neoplasm of cervix uteri, unspecified: Secondary | ICD-10-CM

## 2013-08-22 DIAGNOSIS — G2581 Restless legs syndrome: Secondary | ICD-10-CM

## 2013-08-22 DIAGNOSIS — E119 Type 2 diabetes mellitus without complications: Secondary | ICD-10-CM

## 2013-08-22 DIAGNOSIS — Z23 Encounter for immunization: Secondary | ICD-10-CM | POA: Insufficient documentation

## 2013-08-22 DIAGNOSIS — E1142 Type 2 diabetes mellitus with diabetic polyneuropathy: Secondary | ICD-10-CM

## 2013-08-22 DIAGNOSIS — R079 Chest pain, unspecified: Secondary | ICD-10-CM

## 2013-08-22 DIAGNOSIS — K5730 Diverticulosis of large intestine without perforation or abscess without bleeding: Secondary | ICD-10-CM

## 2013-08-22 DIAGNOSIS — K5750 Diverticulosis of both small and large intestine without perforation or abscess without bleeding: Secondary | ICD-10-CM

## 2013-08-22 HISTORY — DX: Hemorrhage of anus and rectum: K62.5

## 2013-08-22 LAB — POCT CBC
Granulocyte percent: 51.1 %G (ref 37–80)
HCT, POC: 42.8 % (ref 37.7–47.9)
Hemoglobin: 14 g/dL (ref 12.2–16.2)
Lymph, poc: 4.4 — AB (ref 0.6–3.4)
MCH, POC: 27.3 pg (ref 27–31.2)
MCHC: 32.8 g/dL (ref 31.8–35.4)
MCV: 83.2 fL (ref 80–97)
MPV: 7.8 fL (ref 0–99.8)
POC Granulocyte: 4.8 (ref 2–6.9)
Platelet Count, POC: 226 10*3/uL (ref 142–424)
RBC: 5.1 M/uL (ref 4.04–5.48)
RDW, POC: 12.6 %
WBC: 9.4 10*3/uL (ref 4.6–10.2)

## 2013-08-22 NOTE — Progress Notes (Signed)
SUBJECTIVE: CC: Chief Complaint  Patient presents with  . Follow-up    1 mponth follow up for medical problems     HPI:  Came for 1 month follow up for musculoskeletal chest wall pain. This is much better  Brother died of a massive heart attack last year and her sister still cries and she gets depressed when her sister cries. Suffers from chronic depression no changes.  Has been seeing intermittent blood in the stools. It has been a couple years since her last colonoscopy. No abdominal pain. A little fresh blood on the stools.  No fever , no chills.  Past Medical History  Diagnosis Date  . Palpitations     monitor 9/05: NSR, sinus tachy, PVCs  . Chest pain     a. Myoview 9/05: no ischemia, no scar, EF 70%  . DM2 (diabetes mellitus, type 2)   . HLD (hyperlipidemia)   . HTN (hypertension)   . GERD (gastroesophageal reflux disease)     Hiatal Hernia  . Asthma   . Diverticulosis   . Obesity   . Lumbar disc disease   . Anxiety and depression   . Allergic rhinitis   . Nephrolithiasis   . Frequent UTI   . Left rotator cuff tear   . Fibromyalgia    Past Surgical History  Procedure Laterality Date  . Total abdominal hysterectomy w/ bilateral salpingoophorectomy    . Cesarean section    . Foot surgery    . Tonsillectomy    . Right wrist surgery     History   Social History  . Marital Status: Married    Spouse Name: N/A    Number of Children: 2  . Years of Education: N/A   Occupational History  . disabled    Social History Main Topics  . Smoking status: Former Games developer  . Smokeless tobacco: Not on file  . Alcohol Use: No  . Drug Use: No  . Sexual Activity: Not on file   Other Topics Concern  . Not on file   Social History Narrative  . No narrative on file   Family History  Problem Relation Age of Onset  . Coronary artery disease Mother   . Coronary artery disease Father    Current Outpatient Prescriptions on File Prior to Visit  Medication Sig Dispense  Refill  . albuterol (PROVENTIL HFA;VENTOLIN HFA) 108 (90 BASE) MCG/ACT inhaler Inhale 2 puffs into the lungs every 8 (eight) hours as needed for wheezing or shortness of breath.       Marland Kitchen amitriptyline (ELAVIL) 25 MG tablet Take 50 mg by mouth at bedtime.       Marland Kitchen aspirin 81 MG EC tablet Take 81 mg by mouth daily.        . clonazePAM (KLONOPIN) 2 MG tablet Take 2 mg by mouth 5 (five) times daily.        . cyclobenzaprine (FLEXERIL) 10 MG tablet Take 10 mg by mouth 3 (three) times daily as needed for muscle spasms.       . diclofenac sodium (VOLTAREN) 1 % GEL Apply 2 g topically 4 (four) times daily.  100 g  1  . exenatide (BYETTA) 10 MCG/0.04ML SOLN Inject 10 mcg into the skin 2 (two) times daily with a meal.       . famotidine (PEPCID) 20 MG tablet Take 1 tablet (20 mg total) by mouth 2 (two) times daily.  60 tablet  0  . Fenofibrate (LIPOFEN) 150 MG CAPS Take 1  capsule (150 mg total) by mouth daily.  90 each  0  . fentaNYL (DURAGESIC - DOSED MCG/HR) 50 MCG/HR       . fexofenadine (ALLEGRA) 60 MG tablet Take 60 mg by mouth 2 (two) times daily.        . fish oil-omega-3 fatty acids 1000 MG capsule Take 2 g by mouth daily.        . fluticasone (FLONASE) 50 MCG/ACT nasal spray 2 sprays by Nasal route daily. 1 spray each nostril qd       . Fluticasone-Salmeterol (ADVAIR DISKUS) 250-50 MCG/DOSE AEPB Inhale 1 puff into the lungs every 12 (twelve) hours.        . gabapentin (NEURONTIN) 800 MG tablet Take 800 mg by mouth 4 (four) times daily.       Marland Kitchen glimepiride (AMARYL) 2 MG tablet Take 2-4 mg by mouth 2 (two) times daily. Take 1 tablet in AM and 2 tablets in PM      . glucose blood (ONE TOUCH ULTRA TEST) test strip Test 3 times daily  100 each  1  . lidocaine (LIDODERM) 5 % Place 3 patches onto the skin daily. Apply up to 3 patches to area of pain for 12 hour, then Remove & Discard patch.  May reapply patch(es) after being patch free for 12 hours.  270 patch  0  . lisinopril (PRINIVIL,ZESTRIL) 10 MG  tablet Take 1 tablet (10 mg total) by mouth daily.  30 tablet  5  . methocarbamol (ROBAXIN) 500 MG tablet Take 500 mg by mouth 3 (three) times daily as needed (for muscle spasms).       . Multiple Vitamin (MULTIVITAMIN WITH MINERALS) TABS tablet Take 1 tablet by mouth daily.      . rosuvastatin (CRESTOR) 20 MG tablet Take 1 tablet (20 mg total) by mouth daily.  90 tablet  0  . sertraline (ZOLOFT) 100 MG tablet Take 100 mg by mouth 2 (two) times daily.       . Vitamin D, Ergocalciferol, (DRISDOL) 50000 UNITS CAPS 50,000 Units every 7 (seven) days.        No current facility-administered medications on file prior to visit.   Allergies  Allergen Reactions  . Codeine Other (See Comments)    unknown  . Niaspan [Niacin Er] Other (See Comments)    Increase blood glucose   Immunization History  Administered Date(s) Administered  . Influenza Whole 10/20/2006  . Influenza,inj,Quad PF,36+ Mos 08/22/2013  . Td 10/20/2002   Prior to Admission medications   Medication Sig Start Date End Date Taking? Authorizing Provider  albuterol (PROVENTIL HFA;VENTOLIN HFA) 108 (90 BASE) MCG/ACT inhaler Inhale 2 puffs into the lungs every 8 (eight) hours as needed for wheezing or shortness of breath.    Yes Historical Provider, MD  amitriptyline (ELAVIL) 25 MG tablet Take 50 mg by mouth at bedtime.  03/03/13  Yes Historical Provider, MD  aspirin 81 MG EC tablet Take 81 mg by mouth daily.     Yes Historical Provider, MD  clonazePAM (KLONOPIN) 2 MG tablet Take 2 mg by mouth 5 (five) times daily.     Yes Historical Provider, MD  cyclobenzaprine (FLEXERIL) 10 MG tablet Take 10 mg by mouth 3 (three) times daily as needed for muscle spasms.    Yes Historical Provider, MD  diclofenac sodium (VOLTAREN) 1 % GEL Apply 2 g topically 4 (four) times daily. 07/26/13  Yes Ileana Ladd, MD  exenatide (BYETTA) 10 MCG/0.04ML SOLN Inject 10 mcg into  the skin 2 (two) times daily with a meal.    Yes Historical Provider, MD  famotidine  (PEPCID) 20 MG tablet Take 1 tablet (20 mg total) by mouth 2 (two) times daily. 07/22/12  Yes Scott T Alben Spittle, PA-C  Fenofibrate (LIPOFEN) 150 MG CAPS Take 1 capsule (150 mg total) by mouth daily. 08/08/13  Yes Ileana Ladd, MD  fentaNYL (DURAGESIC - DOSED MCG/HR) 50 MCG/HR  06/21/13  Yes Historical Provider, MD  fexofenadine (ALLEGRA) 60 MG tablet Take 60 mg by mouth 2 (two) times daily.     Yes Historical Provider, MD  fish oil-omega-3 fatty acids 1000 MG capsule Take 2 g by mouth daily.     Yes Historical Provider, MD  fluticasone (FLONASE) 50 MCG/ACT nasal spray 2 sprays by Nasal route daily. 1 spray each nostril qd    Yes Historical Provider, MD  Fluticasone-Salmeterol (ADVAIR DISKUS) 250-50 MCG/DOSE AEPB Inhale 1 puff into the lungs every 12 (twelve) hours.     Yes Historical Provider, MD  gabapentin (NEURONTIN) 800 MG tablet Take 800 mg by mouth 4 (four) times daily.    Yes Historical Provider, MD  glimepiride (AMARYL) 2 MG tablet Take 2-4 mg by mouth 2 (two) times daily. Take 1 tablet in AM and 2 tablets in PM   Yes Historical Provider, MD  glucose blood (ONE TOUCH ULTRA TEST) test strip Test 3 times daily 08/09/13  Yes Ileana Ladd, MD  lidocaine (LIDODERM) 5 % Place 3 patches onto the skin daily. Apply up to 3 patches to area of pain for 12 hour, then Remove & Discard patch.  May reapply patch(es) after being patch free for 12 hours. 08/08/13  Yes Ileana Ladd, MD  lisinopril (PRINIVIL,ZESTRIL) 10 MG tablet Take 1 tablet (10 mg total) by mouth daily. 06/15/13  Yes Ileana Ladd, MD  methocarbamol (ROBAXIN) 500 MG tablet Take 500 mg by mouth 3 (three) times daily as needed (for muscle spasms).  12/22/12  Yes Historical Provider, MD  Multiple Vitamin (MULTIVITAMIN WITH MINERALS) TABS tablet Take 1 tablet by mouth daily.   Yes Historical Provider, MD  rosuvastatin (CRESTOR) 20 MG tablet Take 1 tablet (20 mg total) by mouth daily. 08/08/13  Yes Ileana Ladd, MD  sertraline (ZOLOFT) 100 MG  tablet Take 100 mg by mouth 2 (two) times daily.  12/28/12  Yes Historical Provider, MD  Vitamin D, Ergocalciferol, (DRISDOL) 50000 UNITS CAPS 50,000 Units every 7 (seven) days.  02/19/13  Yes Historical Provider, MD     ROS: As above in the HPI. All other systems are stable or negative.  OBJECTIVE: APPEARANCE:  Patient in no acute distress.The patient appeared well nourished and normally developed. Acyanotic. Waist: VITAL SIGNS:BP 109/70  Pulse 80  Temp(Src) 97.7 F (36.5 C) (Oral)  Ht 5\' 4"  (1.626 m)  Wt 220 lb 6.4 oz (99.973 kg)  BMI 37.81 kg/m2 Obese WF. Slow movement except in the hallway when she saw a friend and walked over more briskly Very histrionic and rambles on about family and all various symptoms.  SKIN: warm and  Dry without overt rashes, tattoos and scars  HEAD and Neck: without JVD, Head and scalp: normal Eyes:No scleral icterus. Fundi normal, eye movements normal. Ears: Auricle normal, canal normal, Tympanic membranes normal, insufflation normal. Nose: normal Throat: normal Neck & thyroid: normal  CHEST & LUNGS: Chest wall: normal Lungs: Clear  CVS: Reveals the PMI to be normally located. Regular rhythm, First and Second Heart sounds are normal,  absence of murmurs, rubs or gallops. Peripheral vasculature: Radial pulses: normal Dorsal pedis pulses: normal Posterior pulses: normal  ABDOMEN:  Appearance: obese Benign, no organomegaly, no masses, no Abdominal Aortic enlargement. No Guarding , no rebound. No Bruits. Bowel sounds: normal  RECTAL: N/A GU: N/A  EXTREMETIES: nonedematous.  MUSCULOSKELETAL:  Chest wall is  Non tender.  NEUROLOGIC: oriented to time,place and person; nonfocal. ASSESSMENT: Diabetes mellitus  Benign essential HTN  Need for prophylactic vaccination and inoculation against influenza  Diabetic neuropathy, type II diabetes mellitus  Hyperlipidemia  Cervical adenocarcinoma  Restless leg syndrome  Chronic  pain  Chest pain, unspecified  Diverticul disease small and large intestine, no perforati or abscess - Plan: POCT CBC  Rectal bleeding - Plan: POCT CBC, Ambulatory referral to Gastroenterology  Screening for breast cancer - Plan: MM Digital Screening  Main problem again is depression and it appears to be stable.   PLAN: Continue present medication regimen. Results for orders placed in visit on 08/22/13  POCT CBC      Result Value Range   WBC 9.4  4.6 - 10.2 K/uL   Lymph, poc 4.4 (*) 0.6 - 3.4   POC LYMPH PERCENT 46.Marland Kitchen5  10 - 50 %L   POC Granulocyte 4.8  2 - 6.9   Granulocyte percent 51.1  37 - 80 %G   RBC 5.1  4.04 - 5.48 M/uL   Hemoglobin 14.0  12.2 - 16.2 g/dL   HCT, POC 14.7  82.9 - 47.9 %   MCV 83.2  80 - 97 fL   MCH, POC 27.3  27 - 31.2 pg   MCHC 32.8  31.8 - 35.4 g/dL   RDW, POC 56.2     Platelet Count, POC 226.0  142 - 424 K/uL   MPV 7.8  0 - 99.8 fL    Orders Placed This Encounter  Procedures  . MM Digital Screening    Standing Status: Future     Number of Occurrences:      Standing Expiration Date: 10/22/2014    Order Specific Question:  Reason for Exam (SYMPTOM  OR DIAGNOSIS REQUIRED)    Answer:  past due for mammogram    Order Specific Question:  Is the patient pregnant?    Answer:  No    Order Specific Question:  Preferred imaging location?    Answer:  External  . Ambulatory referral to Gastroenterology    Referral Priority:  Routine    Referral Type:  Consultation    Referral Reason:  Specialty Services Required    Requested Specialty:  Gastroenterology    Number of Visits Requested:  1  . POCT CBC   No orders of the defined types were placed in this encounter.   There are no discontinued medications. Return in about 2 months (around 10/22/2013) for Recheck medical problems.  Nollie Shiflett P. Modesto Charon, M.D.

## 2013-08-24 NOTE — Progress Notes (Signed)
Quick Note:  Call patient. Labs normal. No change in plan. ______ 

## 2013-09-16 ENCOUNTER — Other Ambulatory Visit: Payer: Self-pay | Admitting: Family Medicine

## 2013-09-20 ENCOUNTER — Other Ambulatory Visit: Payer: Self-pay | Admitting: Family Medicine

## 2013-09-29 ENCOUNTER — Other Ambulatory Visit: Payer: Self-pay | Admitting: *Deleted

## 2013-09-29 NOTE — Telephone Encounter (Signed)
Last ov 08/22/13

## 2013-09-30 MED ORDER — AMITRIPTYLINE HCL 25 MG PO TABS: ORAL_TABLET | ORAL | Status: DC

## 2013-09-30 NOTE — Telephone Encounter (Signed)
Call patient : Prescription refilled & sent to pharmacy in [facility]. 

## 2013-10-04 ENCOUNTER — Telehealth: Payer: Self-pay | Admitting: Pharmacist

## 2013-10-05 ENCOUNTER — Other Ambulatory Visit: Payer: Self-pay | Admitting: Pharmacist

## 2013-10-05 DIAGNOSIS — G894 Chronic pain syndrome: Secondary | ICD-10-CM

## 2013-10-05 NOTE — Progress Notes (Signed)
Dr. Marilynn Latino that patient sees for pain management is no longer going to practice.  Dr Marilynn Latino suggested that patient see Dr Laurian Brim.  Referral made

## 2013-10-05 NOTE — Telephone Encounter (Signed)
Patient states she has been drinking Glucerna shakes recently.  Her BG has been much better in morning (somethimes tool low - 70's and 80's) but around 180 before bedtime.  She is injecting Byetta bid for her BG and taking glimepiride 2mg  qam and 4mg  qpm (takes at bedtime). Suggested patient change glimepiride to 2mg  prior to breakfast and 2mg  prior to supper.   Taking prior to supper should help with bedtime BG and decreasing dose from 4mg  to 2mg  should decrease hypoglycemia in am.  Patient instructed to call office if BG increases over 200 or if she continues to experience hypoglycemia greater than once weekly or if she has severe hypoglycemia (less than 60).

## 2013-10-18 ENCOUNTER — Ambulatory Visit: Payer: Managed Care, Other (non HMO) | Admitting: Family Medicine

## 2013-10-18 ENCOUNTER — Other Ambulatory Visit: Payer: Self-pay | Admitting: Family Medicine

## 2013-11-03 ENCOUNTER — Telehealth: Payer: Self-pay | Admitting: Pharmacist

## 2013-11-03 NOTE — Telephone Encounter (Signed)
Patient states that Dr. Isabelle Course office called to make appt.  According to patient the representative was very confusing and she is a little frustrated.  I offered referral to another pain clinic but patient states that this is not an option because she has to depend on her husband to drive her.  I gave patient 3 options to consider: 1- continue with Dr Francesco Runner 2- go to [facility] office of her past pain management team 3- we can refer to another pain specialist.   Patient to think about and all me next week.

## 2013-11-18 ENCOUNTER — Other Ambulatory Visit: Payer: Self-pay | Admitting: Family Medicine

## 2013-12-19 ENCOUNTER — Other Ambulatory Visit: Payer: Self-pay | Admitting: Family Medicine

## 2013-12-19 DIAGNOSIS — E1140 Type 2 diabetes mellitus with diabetic neuropathy, unspecified: Secondary | ICD-10-CM | POA: Insufficient documentation

## 2013-12-21 NOTE — Telephone Encounter (Signed)
Patient needs to be seen. Has exceeded time since last visit. Needs to bring all medications to next appointment.

## 2013-12-21 NOTE — Telephone Encounter (Signed)
Last seen 08/22/13 FPW  Last glucose 07/08/13

## 2014-01-19 ENCOUNTER — Other Ambulatory Visit: Payer: Self-pay | Admitting: Family Medicine

## 2014-01-23 NOTE — Telephone Encounter (Signed)
Patient needs to be seen. Has exceeded time since last visit. Limited quantity refilled. Needs to bring all medications to next appointment.   

## 2014-02-09 ENCOUNTER — Telehealth: Payer: Self-pay | Admitting: Family Medicine

## 2014-02-09 ENCOUNTER — Other Ambulatory Visit: Payer: Self-pay | Admitting: Family Medicine

## 2014-02-09 MED ORDER — FINGERSTIX LANCETS MISC: Status: DC

## 2014-02-09 MED ORDER — ATORVASTATIN CALCIUM 80 MG PO TABS: 80.0000 mg | ORAL_TABLET | Freq: Every day | ORAL | Status: DC

## 2014-02-09 MED ORDER — GLUCOSE BLOOD VI STRP: ORAL_STRIP | Status: DC

## 2014-02-09 NOTE — Telephone Encounter (Signed)
rx sent to pharmacy and patient called. 

## 2014-02-09 NOTE — Telephone Encounter (Signed)
Patient states that she has not been taking crestor because she has not met her deductible yet this year (is $5000) wants to know if we have samples and we do not.   Change to less expensive - atorvastatin 31m 1 tablet daily.  Rx sent to [facility].

## 2014-02-19 ENCOUNTER — Other Ambulatory Visit: Payer: Self-pay | Admitting: Family Medicine

## 2014-02-21 NOTE — Telephone Encounter (Signed)
Patient needs to be seen. Has exceeded time since last visit. Limited quantity refilled. Needs to bring all medications to next appointment.   

## 2014-02-21 NOTE — Telephone Encounter (Signed)
Patient last seen in office on 08-22-13. Was supposed to return in January for follow up but did not. Please advise on refills

## 2014-03-14 ENCOUNTER — Other Ambulatory Visit: Payer: Self-pay | Admitting: Family Medicine

## 2014-03-14 DIAGNOSIS — I10 Essential (primary) hypertension: Secondary | ICD-10-CM

## 2014-03-14 NOTE — Telephone Encounter (Signed)
Last seen 08/22/13 FPW  Last lipid 07/08/13

## 2014-03-14 NOTE — Telephone Encounter (Signed)
Please make sure that this patient has an appointment to see a provider and get lab work because of taking a statin drug. A prescription made the refill x1

## 2014-03-14 NOTE — Telephone Encounter (Signed)
Last seen 08/22/13 FPW  Last lipid 07/08/13 

## 2014-03-15 ENCOUNTER — Other Ambulatory Visit: Payer: Self-pay

## 2014-03-15 DIAGNOSIS — I10 Essential (primary) hypertension: Secondary | ICD-10-CM

## 2014-03-15 MED ORDER — LISINOPRIL 10 MG PO TABS: 10.0000 mg | ORAL_TABLET | Freq: Every day | ORAL | Status: DC

## 2014-03-15 MED ORDER — GLIMEPIRIDE 2 MG PO TABS: ORAL_TABLET | ORAL | Status: DC

## 2014-03-15 NOTE — Telephone Encounter (Signed)
Last seen 08/22/13 FPW  Last glucose 07/08/13

## 2014-03-15 NOTE — Telephone Encounter (Signed)
Patient NTBS for follow up and lab work  

## 2014-03-15 NOTE — Telephone Encounter (Signed)
Patient aware.

## 2014-03-23 ENCOUNTER — Other Ambulatory Visit: Payer: Self-pay

## 2014-03-23 MED ORDER — GLIMEPIRIDE 2 MG PO TABS: ORAL_TABLET | ORAL | Status: DC

## 2014-03-23 NOTE — Telephone Encounter (Signed)
Last seen 08/22/13 FPW  Last glucose 07/07/13

## 2014-03-23 NOTE — Telephone Encounter (Signed)
Patient NTBS for follow up and lab work  

## 2014-04-08 ENCOUNTER — Other Ambulatory Visit: Payer: Self-pay | Admitting: Nurse Practitioner

## 2014-04-08 ENCOUNTER — Other Ambulatory Visit: Payer: Self-pay | Admitting: Family Medicine

## 2014-05-11 ENCOUNTER — Other Ambulatory Visit: Payer: Self-pay | Admitting: Family Medicine

## 2014-05-31 ENCOUNTER — Other Ambulatory Visit: Payer: Self-pay | Admitting: Family Medicine

## 2014-06-28 ENCOUNTER — Other Ambulatory Visit: Payer: Self-pay | Admitting: Family Medicine

## 2014-07-13 ENCOUNTER — Ambulatory Visit (INDEPENDENT_AMBULATORY_CARE_PROVIDER_SITE_OTHER): Payer: 59 | Admitting: Pharmacist

## 2014-07-13 ENCOUNTER — Encounter: Payer: Self-pay | Admitting: Pharmacist

## 2014-07-13 VITALS — BP 115/77 | HR 80 | Ht 64.0 in | Wt 225.0 lb

## 2014-07-13 DIAGNOSIS — E1149 Type 2 diabetes mellitus with other diabetic neurological complication: Secondary | ICD-10-CM

## 2014-07-13 DIAGNOSIS — R300 Dysuria: Secondary | ICD-10-CM

## 2014-07-13 DIAGNOSIS — I10 Essential (primary) hypertension: Secondary | ICD-10-CM

## 2014-07-13 DIAGNOSIS — E1142 Type 2 diabetes mellitus with diabetic polyneuropathy: Secondary | ICD-10-CM

## 2014-07-13 DIAGNOSIS — E785 Hyperlipidemia, unspecified: Secondary | ICD-10-CM

## 2014-07-13 DIAGNOSIS — E1140 Type 2 diabetes mellitus with diabetic neuropathy, unspecified: Secondary | ICD-10-CM

## 2014-07-13 LAB — POCT URINALYSIS DIPSTICK
Bilirubin, UA: NEGATIVE
Blood, UA: NEGATIVE
KETONES UA: NEGATIVE
LEUKOCYTES UA: NEGATIVE
NITRITE UA: NEGATIVE
Protein, UA: NEGATIVE
Spec Grav, UA: <=1.005
Urobilinogen, UA: NEGATIVE
pH, UA: 8

## 2014-07-13 LAB — POCT UA - MICROSCOPIC ONLY
Casts, Ur, LPF, POC: NEGATIVE
Crystals, Ur, HPF, POC: NEGATIVE
Mucus, UA: NEGATIVE
RBC, URINE, MICROSCOPIC: NEGATIVE
YEAST UA: NEGATIVE

## 2014-07-13 LAB — GLUCOSE, POCT (MANUAL RESULT ENTRY): POC GLUCOSE: 381 mg/dL — AB (ref 70–99)

## 2014-07-13 LAB — POCT GLYCOSYLATED HEMOGLOBIN (HGB A1C): Hemoglobin A1C: 10.9

## 2014-07-13 MED ORDER — CIPROFLOXACIN HCL 500 MG PO TABS: 500.0000 mg | ORAL_TABLET | Freq: Two times a day (BID) | ORAL | Status: DC

## 2014-07-13 MED ORDER — DULAGLUTIDE 1.5 MG/0.5ML ~~LOC~~ SOAJ: 1.5000 mg | SUBCUTANEOUS | Status: DC

## 2014-07-13 MED ORDER — ATORVASTATIN CALCIUM 80 MG PO TABS: 80.0000 mg | ORAL_TABLET | Freq: Every evening | ORAL | Status: DC

## 2014-07-13 MED ORDER — GLIMEPIRIDE 2 MG PO TABS: ORAL_TABLET | ORAL | Status: DC

## 2014-07-13 MED ORDER — LISINOPRIL 10 MG PO TABS: 10.0000 mg | ORAL_TABLET | Freq: Every day | ORAL | Status: DC

## 2014-07-13 NOTE — Progress Notes (Signed)
Diabetes Follow-Up Visit Chief Complaint:   Chief Complaint  Patient presents with  . Hyperlipidemia  . Diabetes  . Hypertension  . Medication Problem     Filed Vitals:   07/13/14 1030  BP: 115/77  Pulse: 80    Filed Weights   07/13/14 1030  Weight: 225 lb (102.059 kg)   Body mass index is 38.6 kg/(m^2).   HPI: patinet with poorly controlled type 2 DM.  She has been out of Byetta for about 2 months due to cost of medication and also out of glimepiride for 1-2 week.  BG has been in the 200 and 300's.  She states that she has a high deductible medical plan and has not been able to afford medications or to come to the office to have labs rechecked.   Current Diabetes Medications:  glimepiride (Amaryl) and byetta  Home BG Monitoring:  Checking 1 times a day. Average:  200's   Low fat/carbohydrate diet?  No Nicotine Abuse?  No Medication Compliance?  No Exercise?  No Alcohol Abuse?  No   Exam Edema:  1+ bilaterally  Polyuria:  Negative but does c/o of dysuria and she thinks she might have a UTI  Polydipsia:  negative Polyphagia:  negative  BMI:  Body mass index is 38.6 kg/(m^2).   Weight changes:  Increased 5# since off byetta General Appearance:  obese and patient is upset and crying due to stress of finances. Mood/Affect:  anxious and sad   Lab Results  Component Value Date   HGBA1C 10.9% 07/13/2014    No results found for this basename: Derl Barrow    Lab Results  Component Value Date   CHOL 122 07/08/2013   HDL 38* 07/08/2013   LDLCALC 53 07/08/2013   TRIG 155* 07/08/2013   CHOLHDL 3.2 07/08/2013      Assessment: 1.  Diabetes.  Uncontrolled due to poor compliance secondary to fiances 2.  Blood Pressure.  Good today 3.  Lipids.  Checking today - pending 4.  Obesity  5.  UTI  Recommendations: 1.  Medication recommendations at this time are as follows:  Restart glimepiride 59m;  Discontinue byetta;  Start Trulicity 10.0BBSQ q week - gave #4 samples  and first dose given in office today.   Discussed at length the importance of compliance with meds - she is encouraged to call office if she cannot get medications and we will try to get for her if possible or substitute samples.   Cipro 5071mbid for UTI 2.  Reviewed HBG goals:  Fasting 80-130 and 1-2 hour post prandial <180.  Patient is instructed to check BG 1  To 2 times per day.    3.  BP goal < 140/85. 4.  LDL goal of < 100, HDL > 40 and TG < 150. 5.  Eye Exam yearly and Dental Exam every 6 months. 6.  Dietary recommendations:  Limit portion sizes and CHO intake 7.  Physical Activity recommendations:  Increase as able - try walking 5 minues twice a day 8.    Orders Placed This Encounter  Procedures  . CMP14+EGFR  . Lipid panel  . Microalbumin, urine  . POCT glycosylated hemoglobin (Hb A1C)  . POCT UA - Microscopic Only  . POCT urinalysis dipstick  . POCT glucose (manual entry)    9.  Return to clinic in 4-6 wks - [facility]   Time spent counseling patient:  45 minutes   Omari Mcmanaway,  PharmD, CPP, CDE

## 2014-07-13 NOTE — Patient Instructions (Signed)
Diabetes and Standards of Medical Care  Diabetes is complicated. You may find that your diabetes team includes a dietitian, nurse, diabetes educator, eye doctor, and more. To help everyone know what is going on and to help you get the care you deserve, the following schedule of care was developed to help keep you on track. Below are the tests, exams, vaccines, medicines, education, and plans you will need.  Blood Glucose Goals Prior to meals = 80 - 130 Within 2 hours of the start of a meal = less than 180  HbA1c test (goal is less than 7.0% - your last value was 10.9%) This test shows how well you have controlled your glucose over the past 2 3 months. It is used to see if your diabetes management plan needs to be adjusted.   It is performed at least 2 times a year if you are meeting treatment goals.  It is performed 4 times a year if therapy has changed or if you are not meeting treatment goals.   Blood pressure test  This test is performed at every routine medical visit. The goal is less than 140/90 mmHg for most people, but 130/80 mmHg in some cases. Ask your health care provider about your goal. Dental exam  Follow up with the dentist regularly. Eye exam  If you are diagnosed with type 1 diabetes as a child, get an exam upon reaching the age of 32 years or older and have had diabetes for 3 5 years. Yearly eye exams are recommended after that initial eye exam.  If you are diagnosed with type 1 diabetes as an adult, get an exam within 5 years of diagnosis and then yearly.  If you are diagnosed with type 2 diabetes, get an exam as soon as possible after the diagnosis and then yearly. Foot care exam  Visual foot exams are performed at every routine medical visit. The exams check for cuts, injuries, or other problems with the feet.  A comprehensive foot exam should be done yearly. This includes visual inspection as well as assessing foot pulses and testing for loss of  sensation.  Check your feet nightly for cuts, injuries, or other problems with your feet. Tell your health care provider if anything is not healing. Kidney function test (urine microalbumin)  This test is performed once a year.  Type 1 diabetes: The first test is performed 5 years after diagnosis.  Type 2 diabetes: The first test is performed at the time of diagnosis.  A serum creatinine and estimated glomerular filtration rate (eGFR) test is done once a year to assess the level of chronic kidney disease (CKD), if present. Lipid profile (cholesterol, HDL, LDL, triglycerides)  Performed every 5 years for most people.  The goal for LDL is less than 100 mg/dL. If you are at high risk, the goal is less than 70 mg/dL.  The goal for HDL is 40 mg/dL 50 mg/dL for men and 50 mg/dL 60 mg/dL for women. An HDL cholesterol of 60 mg/dL or higher gives some protection against heart disease.  The goal for triglycerides is less than 150 mg/dL. Influenza vaccine, pneumococcal vaccine, and hepatitis B vaccine  The influenza vaccine is recommended yearly.  The pneumococcal vaccine is generally given once in a lifetime. However, there are some instances when another vaccination is recommended. Check with your health care provider.  The hepatitis B vaccine is also recommended for adults with diabetes. Diabetes self-management education  Education is recommended at diagnosis and ongoing  as needed. Treatment plan  Your treatment plan is reviewed at every medical visit. Document Released: 08/03/2009 Document Revised: 06/08/2013 Document Reviewed: 03/08/2013 [facility] Patient Information 2014 [place].

## 2014-07-14 ENCOUNTER — Telehealth: Payer: Self-pay | Admitting: Pharmacist

## 2014-07-14 LAB — LIPID PANEL
CHOL/HDL RATIO: 2.6 ratio (ref 0.0–4.4)
Cholesterol, Total: 115 mg/dL (ref 100–199)
HDL: 45 mg/dL (ref >39)
LDL CALC: 47 mg/dL (ref 0–99)
Triglycerides: 115 mg/dL (ref 0–149)
VLDL Cholesterol Cal: 23 mg/dL (ref 5–40)

## 2014-07-14 LAB — CMP14+EGFR
A/G RATIO: 1.7 (ref 1.1–2.5)
ALK PHOS: 82 IU/L (ref 39–117)
ALT: 30 IU/L (ref 0–32)
AST: 24 IU/L (ref 0–40)
Albumin: 4.3 g/dL (ref 3.5–5.5)
BUN / CREAT RATIO: 14 (ref 9–23)
BUN: 12 mg/dL (ref 6–24)
CO2: 27 mmol/L (ref 18–29)
CREATININE: 0.85 mg/dL (ref 0.57–1.00)
Calcium: 9.6 mg/dL (ref 8.7–10.2)
Chloride: 97 mmol/L (ref 97–108)
GFR, EST AFRICAN AMERICAN: 89 mL/min/{1.73_m2} (ref >59)
GFR, EST NON AFRICAN AMERICAN: 77 mL/min/{1.73_m2} (ref >59)
GLOBULIN, TOTAL: 2.5 g/dL (ref 1.5–4.5)
Glucose: 362 mg/dL — ABNORMAL HIGH (ref 65–99)
Potassium: 4.3 mmol/L (ref 3.5–5.2)
SODIUM: 138 mmol/L (ref 134–144)
Total Bilirubin: 0.4 mg/dL (ref 0.0–1.2)
Total Protein: 6.8 g/dL (ref 6.0–8.5)

## 2014-07-14 LAB — MICROALBUMIN, URINE

## 2014-07-14 NOTE — Telephone Encounter (Signed)
Patient notified about labs from 07/13/2014.  All labs WNL or at goal except for BG.  Addressed elevated BG which was mostly likely due to not having medication for DM.  She start Trulicity and reports that BG was 116 this am.   Reminded patient about s/s and how to treat hypoglycemia.  She is to call office if has BG greater than 200 or less than 70 for medication adjustment.

## 2014-07-27 ENCOUNTER — Telehealth: Payer: Self-pay | Admitting: Pharmacist

## 2014-07-27 NOTE — Telephone Encounter (Signed)
I only had Trulicity 0.75mg  instead of 1.5mg  but I left #2 boxes (=#4 syringes) in refridge in lab for patient.  Pt notified.

## 2014-08-29 ENCOUNTER — Telehealth: Payer: Self-pay | Admitting: Pharmacist

## 2014-08-31 MED ORDER — DULAGLUTIDE 1.5 MG/0.5ML ~~LOC~~ SOAJ
1.5000 mg | SUBCUTANEOUS | Status: DC
Start: 2014-08-31 — End: 2014-10-02

## 2014-08-31 NOTE — Telephone Encounter (Signed)
Left #4 pens of Trulicity 1.5mg  in refrig and patient notified.

## 2014-09-07 ENCOUNTER — Ambulatory Visit (INDEPENDENT_AMBULATORY_CARE_PROVIDER_SITE_OTHER): Payer: 59 | Admitting: *Deleted

## 2014-09-07 ENCOUNTER — Ambulatory Visit (INDEPENDENT_AMBULATORY_CARE_PROVIDER_SITE_OTHER): Payer: 59 | Admitting: Family Medicine

## 2014-09-07 ENCOUNTER — Other Ambulatory Visit: Payer: Self-pay | Admitting: *Deleted

## 2014-09-07 ENCOUNTER — Encounter: Payer: Self-pay | Admitting: Family Medicine

## 2014-09-07 VITALS — BP 112/70 | HR 81 | Temp 98.6°F | Ht 61.0 in | Wt 219.8 lb

## 2014-09-07 DIAGNOSIS — I10 Essential (primary) hypertension: Secondary | ICD-10-CM

## 2014-09-07 DIAGNOSIS — Z23 Encounter for immunization: Secondary | ICD-10-CM

## 2014-09-07 DIAGNOSIS — E1149 Type 2 diabetes mellitus with other diabetic neurological complication: Secondary | ICD-10-CM

## 2014-09-07 MED ORDER — FAMOTIDINE 20 MG PO TABS: 20.0000 mg | ORAL_TABLET | Freq: Two times a day (BID) | ORAL | Status: DC

## 2014-09-07 MED ORDER — ALBUTEROL SULFATE HFA 108 (90 BASE) MCG/ACT IN AERS: 2.0000 | INHALATION_SPRAY | Freq: Three times a day (TID) | RESPIRATORY_TRACT | Status: DC | PRN
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
## 2014-09-28 ENCOUNTER — Telehealth: Payer: Self-pay | Admitting: Family Medicine

## 2014-10-02 MED ORDER — DULAGLUTIDE 1.5 MG/0.5ML ~~LOC~~ SOAJ: 1.5000 mg | SUBCUTANEOUS | Status: DC

## 2014-10-02 NOTE — Telephone Encounter (Signed)
Left #2 boxes of Trulicity 1.5mg  (= 4 doses) in refridge for patinet

## 2014-10-11 ENCOUNTER — Other Ambulatory Visit: Payer: Self-pay | Admitting: *Deleted

## 2014-10-11 DIAGNOSIS — I10 Essential (primary) hypertension: Secondary | ICD-10-CM

## 2014-10-11 MED ORDER — LISINOPRIL 10 MG PO TABS: 10.0000 mg | ORAL_TABLET | Freq: Every day | ORAL | Status: DC

## 2014-10-11 MED ORDER — GLIMEPIRIDE 2 MG PO TABS: ORAL_TABLET | ORAL | Status: DC

## 2014-10-11 MED ORDER — ATORVASTATIN CALCIUM 80 MG PO TABS: 80.0000 mg | ORAL_TABLET | Freq: Every evening | ORAL | Status: DC

## 2014-10-11 NOTE — Telephone Encounter (Signed)
Pt requesting refills on her lipitor, lisinopril and her amaryl, rx's sent to pharmacy and pt aware.

## 2014-11-07 ENCOUNTER — Ambulatory Visit (INDEPENDENT_AMBULATORY_CARE_PROVIDER_SITE_OTHER): Payer: 59 | Admitting: Pharmacist Clinician (PhC)/ Clinical Pharmacy Specialist

## 2014-11-07 ENCOUNTER — Encounter: Payer: Self-pay | Admitting: Pharmacist Clinician (PhC)/ Clinical Pharmacy Specialist

## 2014-11-07 DIAGNOSIS — E1165 Type 2 diabetes mellitus with hyperglycemia: Secondary | ICD-10-CM

## 2014-11-07 DIAGNOSIS — E118 Type 2 diabetes mellitus with unspecified complications: Secondary | ICD-10-CM

## 2014-11-07 DIAGNOSIS — E559 Vitamin D deficiency, unspecified: Secondary | ICD-10-CM

## 2014-11-07 DIAGNOSIS — E785 Hyperlipidemia, unspecified: Secondary | ICD-10-CM

## 2014-11-07 DIAGNOSIS — IMO0002 Reserved for concepts with insufficient information to code with codable children: Secondary | ICD-10-CM

## 2014-11-07 LAB — POCT GLYCOSYLATED HEMOGLOBIN (HGB A1C): Hemoglobin A1C: 7.4

## 2014-11-07 MED ORDER — GLIMEPIRIDE 2 MG PO TABS: ORAL_TABLET | ORAL | Status: DC

## 2014-11-07 NOTE — Progress Notes (Signed)
   Subjective:    Patient ID: Casey Sanders, female    DOB: [birth year], 54 y.o.   MRN: [id number]  HPI:  Long standing history of type 2 diabetes.  With strong family history of type 1 and Type 2 diabetes.  Cost is a concern with this patient in affording her medications. She has been turned down for disability for the past 15 years.    Review of Systems  Constitutional: Positive for activity change. Negative for appetite change.  HENT: Negative.   Eyes: Negative.   Musculoskeletal: Positive for myalgias, back pain, arthralgias and gait problem.  Skin: Negative.   Allergic/Immunologic: Negative.   Neurological: Negative.   Psychiatric/Behavioral: Negative for suicidal ideas, hallucinations, behavioral problems, confusion, sleep disturbance, self-injury, dysphoric mood, decreased concentration and agitation. The patient is nervous/anxious. The patient is not hyperactive.        Objective:   Physical Exam  Constitutional: She is oriented to person, place, and time. She appears well-developed and well-nourished.  Cardiovascular: Normal rate and regular rhythm.   Neurological: She is alert and oriented to person, place, and time.  Psychiatric: She has a normal mood and affect. Her behavior is normal. Judgment and thought content normal.          Assessment & Plan:   Diabetes Follow-Up Visit Chief Complaint:  No chief complaint on file.    Exam General Appearance:  alert, oriented, no acute distress Mood/Affect:  anxious  HPI:  Type 2 diabetes follow up  Low fat/carbohydrate diet?  Yes Nicotine Abuse?  No Medication Compliance?  Yes Exercise?  Yes Alcohol Abuse?  No  Home BG Monitoring:  Checking 2 times a day. Average:  114  High: 145  Low:  89   Lab Results  Component Value Date   HGBA1C 7.4% 11/07/2014    No results found for: Derl Barrow  Lab Results  Component Value Date   CHOL 122 07/08/2013   HDL 45 07/13/2014   LDLCALC 47 07/13/2014   TRIG  115 07/13/2014   CHOLHDL 2.6 07/13/2014      Assessment: 1.  Diabetes:  Excellent improvement with A1C declining from 10.9% to 7.4% in three months.  2.  Blood Pressure.  At goal 3.  Lipids.  Pending were checked today.  With weight loss of 7 lbs and blood glucose control improved may be able to stop fenofibrate and decrease lipitor dose. 4.  Foot Care.  Reviewed with patient today since she has neuropathy 5.  Dental Care.  Annual exams 6.  Eye Care/Exam.  Annual eye exam  Recommendations: 1.  Patient is counseled on appropriate foot care. 2.  BP goal < 130/80. 3.  LDL goal of < 100, HDL > 40 and TG < 150. 4.  Eye Exam yearly and Dental Exam every 6 months. 5.  Dietary recommendations:  Reviewed diet with patient, she is doing a great job with there CHO and sugars.  Appetite is much lower since starting Trulicity. 6.  Physical Activity recommendations:  Continue chair exercises 15-30 minutes a day 7.  Medication recommendations at this time are as follows:  Continue same medications, labs pending 8.  Return to clinic in 3-4 months   Time spent counseling patient:  45 min  Physician time spent with patient:  0 Referring provider:  Sabra Heck   PharmD:  Memory Argue, [facility]

## 2014-11-08 LAB — BMP8+EGFR
BUN/Creatinine Ratio: 18 (ref 9–23)
BUN: 14 mg/dL (ref 6–24)
CO2: 28 mmol/L (ref 18–29)
CREATININE: 0.77 mg/dL (ref 0.57–1.00)
Calcium: 9.2 mg/dL (ref 8.7–10.2)
Chloride: 100 mmol/L (ref 97–108)
GFR calc Af Amer: 101 mL/min/{1.73_m2} (ref >59)
GFR, EST NON AFRICAN AMERICAN: 87 mL/min/{1.73_m2} (ref >59)
Glucose: 196 mg/dL — ABNORMAL HIGH (ref 65–99)
Potassium: 4.2 mmol/L (ref 3.5–5.2)
SODIUM: 141 mmol/L (ref 134–144)

## 2014-11-08 LAB — NMR, LIPOPROFILE
CHOLESTEROL: 105 mg/dL (ref 100–199)
HDL Cholesterol by NMR: 40 mg/dL (ref >39)
HDL Particle Number: 28.4 umol/L — ABNORMAL LOW (ref >=30.5)
LDL Particle Number: 513 nmol/L (ref <1000)
LDL SIZE: 19.9 nm (ref >20.5)
LDL-C: 41 mg/dL (ref 0–99)
LP-IR SCORE: 53 — AB (ref <=45)
Small LDL Particle Number: 358 nmol/L (ref <=527)
Triglycerides by NMR: 121 mg/dL (ref 0–149)

## 2014-11-08 LAB — HEPATIC FUNCTION PANEL
ALK PHOS: 57 IU/L (ref 39–117)
ALT: 24 IU/L (ref 0–32)
AST: 24 IU/L (ref 0–40)
Albumin: 4.2 g/dL (ref 3.5–5.5)
Bilirubin, Direct: 0.16 mg/dL (ref 0.00–0.40)
Total Bilirubin: 0.5 mg/dL (ref 0.0–1.2)
Total Protein: 6.7 g/dL (ref 6.0–8.5)

## 2014-11-08 LAB — VITAMIN D 25 HYDROXY (VIT D DEFICIENCY, FRACTURES): Vit D, 25-Hydroxy: 30.9 ng/mL (ref 30.0–100.0)

## 2014-11-14 ENCOUNTER — Telehealth: Payer: Self-pay | Admitting: Pharmacist Clinician (PhC)/ Clinical Pharmacy Specialist

## 2014-11-14 NOTE — Telephone Encounter (Signed)
Called patient with excellent results of lipids.  Will cut atorvastatin 80mg  in half since LDL-C is 41mg /dL.  Patient looked through all of her medications and she is not taking fenofibrate 150mg .  Will re-check lipid labs in 12 weeks.  Excellent improvement in A1C too!  Renal function is excellent and all other labs are normal.  Patient will continue weekly vitamin D 50,000IU.  Re-enforced with patient excellent improvement in diabetes/lipids and to continue diet and exercise.

## 2014-12-05 ENCOUNTER — Telehealth: Payer: Self-pay | Admitting: Pharmacist

## 2014-12-06 MED ORDER — DULAGLUTIDE 1.5 MG/0.5ML ~~LOC~~ SOAJ: 1.5000 mg | SUBCUTANEOUS | Status: DC

## 2014-12-06 NOTE — Telephone Encounter (Signed)
#  4 pens left in refrig for patient.  Left message that Trulicity samples at office for pick up.

## 2015-01-11 ENCOUNTER — Telehealth: Payer: Self-pay | Admitting: Pharmacist

## 2015-01-11 MED ORDER — DULAGLUTIDE 1.5 MG/0.5ML ~~LOC~~ SOAJ: 1.5000 mg | SUBCUTANEOUS | Status: DC

## 2015-01-11 NOTE — Telephone Encounter (Signed)
#  2 boxes of Trulicity 9.8X samples left at front desk. Patient notified.

## 2015-02-08 ENCOUNTER — Encounter: Payer: Self-pay | Admitting: Pharmacist

## 2015-02-08 ENCOUNTER — Ambulatory Visit (INDEPENDENT_AMBULATORY_CARE_PROVIDER_SITE_OTHER): Payer: 59 | Admitting: Pharmacist

## 2015-02-08 ENCOUNTER — Encounter: Payer: Self-pay | Admitting: Family Medicine

## 2015-02-08 ENCOUNTER — Ambulatory Visit (INDEPENDENT_AMBULATORY_CARE_PROVIDER_SITE_OTHER): Payer: 59 | Admitting: Family Medicine

## 2015-02-08 VITALS — BP 110/68 | HR 88 | Wt 216.0 lb

## 2015-02-08 VITALS — BP 110/68 | HR 88 | Temp 98.8°F | Ht 61.0 in | Wt 216.0 lb

## 2015-02-08 DIAGNOSIS — E1149 Type 2 diabetes mellitus with other diabetic neurological complication: Secondary | ICD-10-CM | POA: Diagnosis not present

## 2015-02-08 DIAGNOSIS — R300 Dysuria: Secondary | ICD-10-CM | POA: Diagnosis not present

## 2015-02-08 DIAGNOSIS — N3090 Cystitis, unspecified without hematuria: Secondary | ICD-10-CM

## 2015-02-08 DIAGNOSIS — E785 Hyperlipidemia, unspecified: Secondary | ICD-10-CM

## 2015-02-08 DIAGNOSIS — Z79899 Other long term (current) drug therapy: Secondary | ICD-10-CM | POA: Diagnosis not present

## 2015-02-08 DIAGNOSIS — R1032 Left lower quadrant pain: Secondary | ICD-10-CM | POA: Diagnosis not present

## 2015-02-08 HISTORY — DX: Cystitis, unspecified without hematuria: N30.90

## 2015-02-08 LAB — POCT UA - MICROSCOPIC ONLY
BACTERIA, U MICROSCOPIC: NEGATIVE
Casts, Ur, LPF, POC: NEGATIVE
Crystals, Ur, HPF, POC: NEGATIVE
MUCUS UA: NEGATIVE
RBC, URINE, MICROSCOPIC: NEGATIVE
Yeast, UA: NEGATIVE

## 2015-02-08 LAB — POCT URINALYSIS DIPSTICK
Bilirubin, UA: NEGATIVE
Blood, UA: NEGATIVE
GLUCOSE UA: NEGATIVE
Ketones, UA: NEGATIVE
Nitrite, UA: NEGATIVE
Protein, UA: NEGATIVE
Spec Grav, UA: 1.02
Urobilinogen, UA: NEGATIVE
pH, UA: 5

## 2015-02-08 LAB — POCT GLYCOSYLATED HEMOGLOBIN (HGB A1C): HEMOGLOBIN A1C: 8

## 2015-02-08 MED ORDER — CIPROFLOXACIN HCL 500 MG PO TABS: 500.0000 mg | ORAL_TABLET | Freq: Two times a day (BID) | ORAL | Status: DC

## 2015-02-08 MED ORDER — GLIMEPIRIDE 4 MG PO TABS: 4.0000 mg | ORAL_TABLET | Freq: Every day | ORAL | Status: DC

## 2015-02-08 NOTE — Patient Instructions (Signed)
Diabetes and Standards of Medical Care   Diabetes is complicated. You may find that your diabetes team includes a dietitian, nurse, diabetes educator, eye doctor, and more. To help everyone know what is going on and to help you get the care you deserve, the following schedule of care was developed to help keep you on track. Below are the tests, exams, vaccines, medicines, education, and plans you will need.  Blood Glucose Goals Prior to meals = 80 - 130 Within 2 hours of the start of a meal = less than 180  HbA1c test (goal is less than 7.0% - your last value was 8.0%) This test shows how well you have controlled your glucose over the past 2 to 3 months. It is used to see if your diabetes management plan needs to be adjusted.   It is performed at least 2 times a year if you are meeting treatment goals.  It is performed 4 times a year if therapy has changed or if you are not meeting treatment goals.  Blood pressure test  This test is performed at every routine medical visit. The goal is less than 140/90 mmHg for most people, but 130/80 mmHg in some cases. Ask your health care provider about your goal.  Dental exam  Follow up with the dentist regularly.  Eye exam  If you are diagnosed with type 1 diabetes as a child, get an exam upon reaching the age of 79 years or older and have had diabetes for 3 to 5 years. Yearly eye exams are recommended after that initial eye exam.  If you are diagnosed with type 1 diabetes as an adult, get an exam within 5 years of diagnosis and then yearly.  If you are diagnosed with type 2 diabetes, get an exam as soon as possible after the diagnosis and then yearly.  Foot care exam  Visual foot exams are performed at every routine medical visit. The exams check for cuts, injuries, or other problems with the feet.  A comprehensive foot exam should be done yearly. This includes visual inspection as well as assessing foot pulses and testing for loss of  sensation.  Check your feet nightly for cuts, injuries, or other problems with your feet. Tell your health care provider if anything is not healing.  Kidney function test (urine microalbumin)  This test is performed once a year.  Type 1 diabetes: The first test is performed 5 years after diagnosis.  Type 2 diabetes: The first test is performed at the time of diagnosis.  A serum creatinine and estimated glomerular filtration rate (eGFR) test is done once a year to assess the level of chronic kidney disease (CKD), if present.  Lipid profile (cholesterol, HDL, LDL, triglycerides)  Performed every 5 years for most people.  The goal for LDL is less than 100 mg/dL. If you are at high risk, the goal is less than 70 mg/dL.  The goal for HDL is 40 mg/dL to 50 mg/dL for men and 50 mg/dL to 60 mg/dL for women. An HDL cholesterol of 60 mg/dL or higher gives some protection against heart disease.  The goal for triglycerides is less than 150 mg/dL.  Influenza vaccine, pneumococcal vaccine, and hepatitis B vaccine  The influenza vaccine is recommended yearly.  The pneumococcal vaccine is generally given once in a lifetime. However, there are some instances when another vaccination is recommended. Check with your health care provider.  The hepatitis B vaccine is also recommended for adults with diabetes.  Diabetes self-management education  Education is recommended at diagnosis and ongoing as needed.  Treatment plan  Your treatment plan is reviewed at every medical visit.  Document Released: 08/03/2009 Document Revised: 06/08/2013 Document Reviewed: 03/08/2013 [employer] Patient Information 2014 [employer].   

## 2015-02-08 NOTE — Progress Notes (Signed)
Diabetes Follow-Up Visit Chief Complaint:   Chief Complaint  Patient presents with  . Diabetes     Filed Vitals:   02/08/15 0908  BP: 110/68  Pulse: 88    Filed Weights   02/08/15 0908  Weight: 216 lb (97.977 kg)   Body mass index is 40.83 kg/(m^2).   HPI: Patient with type 2 DM not at goal.  She is here today for follow up and to have labs checked.  BG has ranged from 130 to 170.  She has been compliant with mediations.   Also today Mrs. Crocket c/o abdominal pain in the lower left side.  She is concerned because she had a brother die from pancreatitis  Current Diabetes Medications:  glimepiride (Amaryl) 50m qam and 430mqpm.  Trulicity 1.0.3TWQ weekly  Home BG Monitoring:  Checking 1 to 2 times a day. Average: 150   Low fat/carbohydrate diet?  Most of the time Nicotine Abuse?  No Medication Compliance?  No Exercise?  No Alcohol Abuse?  No  Polyuria:  Negative but does c/o of dysuria and she thinks she might have a UTI  Polydipsia:  negative Polyphagia:  negative  BMI:  Body mass index is 40.83 kg/(m^2).    Weight changes:  Has lost 4# since last visit 08/2014 General Appearance:  obese in NAD this morning Mood/Affect:  normal and flat affect which is her baseline   Lab Results  Component Value Date   HGBA1C 8.0 02/08/2015    [facility] results [facility] for: [facility]  Component Value Date   CHOL 105 11/07/2014   HDL 40 11/07/2014   LDLCALC 47 07/13/2014   TRIG 121 11/07/2014   CHOLHDL 2.6 07/13/2014      Assessment: 1.  Diabetes. Uncontrolled - A1c has increased since 10/2014 2.  Blood Pressure.  Good today 3.  Lipids.  Checking today - pending 4.  Obesity - weight has decreased by 4# 5.  Abdominal pain - r/o pancreatitis / UTI  Recommendations: 1.  Medication recommendations at this time are as follows:   Increase glimepiride 12m37mID  Continue Trulicity 1.56.5KC q week - gave #4 samples  2.  Reviewed HBG goals:  Fasting 80-130 and 1-2 hour post  prandial <180.  Patient is instructed to check BG 1  To 2 times per day.    3.  BP goal < 140/85. 4.  LDL goal of < 100, HDL > 40 and TG < 150. 5.  Eye Exam yearly and Dental Exam every 6 months. 6.  Dietary recommendations:  Limit portion sizes and CHO intake 7.  Physical Activity recommendations:  Increase as able - try walking 5 minues twice a day 8.    Orders Placed This Encounter  Procedures  . Urine culture  . CMP14+EGFR  . Lipid panel  . Microalbumin, urine  . Lipase  . Amylase  . Volatiles,Blood (acetone,ethanol,isoprop,methanol)  . POCT glycosylated hemoglobin (Hb A1C)  . POCT UA - Microscopic Only  . POCT urinalysis dipstick    9.  Return to clinic this evening to see Dr StaLivia Snellen evaluate abdominal pain.   Time spent counseling patient:  45 minutes   TamCherre RobinsharmD, CPP, CDE

## 2015-02-08 NOTE — Progress Notes (Addendum)
Subjective:  Patient ID: Casey Sanders, female    DOB: 12/23/1960  Age: 54 y.o. MRN: [id number]  CC: Abdominal Pain   HPI Casey Sanders presents for 2 days LLQ pain. Frequency and dysuria as well. Also nauseated. Concerned about pancreatitis due to family history (brother) and use of trulicity. [place] today lipase and amylase levels were drawn at order of clinical pharmacist.  History Casey Sanders has a past medical history of Palpitations; Chest pain; DM2 (diabetes mellitus, type 2); HLD (hyperlipidemia); HTN (hypertension); GERD (gastroesophageal reflux disease); Asthma; Diverticulosis; Obesity; Lumbar disc disease; Anxiety and depression; Allergic rhinitis; Nephrolithiasis; Frequent UTI; Left rotator cuff tear; and Fibromyalgia.   She has past surgical history that includes Total abdominal hysterectomy w/ bilateral salpingoophorectomy; Cesarean section; Foot surgery; tonsillectomy; and right wrist surgery.   Her family history includes Coronary artery disease in her father and mother.She reports that she has quit smoking. She does not have any smokeless tobacco history on file. She reports that she does not drink alcohol or use illicit drugs.  Current Outpatient Prescriptions on File Prior to Visit  Medication Sig Dispense Refill  . albuterol (PROVENTIL HFA;VENTOLIN HFA) 108 (90 BASE) MCG/ACT inhaler Inhale 2 puffs into the lungs every 8 (eight) hours as needed for wheezing or shortness of breath. 3.7 g 11  . amitriptyline (ELAVIL) 25 MG tablet Take one or two tablets at bedtime. 60 tablet 0  . aspirin 81 MG EC tablet Take 81 mg by mouth daily.      Marland Kitchen atorvastatin (LIPITOR) 80 MG tablet Take 1 tablet (80 mg total) by mouth every evening. 30 tablet 2  . clonazePAM (KLONOPIN) 2 MG tablet Take 2 mg by mouth 5 (five) times daily.      . diclofenac sodium (VOLTAREN) 1 % GEL Apply 2 g topically 4 (four) times daily. 100 g 1  . Dulaglutide (TRULICITY) 1.5 NF/6.2ZH SOPN Inject 1.5 mg into the skin  once a week. 4 pen 0  . famotidine (PEPCID) 20 MG tablet Take 1 tablet (20 mg total) by mouth 2 (two) times daily. 60 tablet 2  . Fenofibrate 150 MG CAPS TAKE 1 CAPSULE (150 MG TOTAL) BY MOUTH DAILY. 30 capsule 2  . fentaNYL (DURAGESIC - DOSED MCG/HR) 50 MCG/HR     . fexofenadine (ALLEGRA) 60 MG tablet Take 60 mg by mouth 2 (two) times daily.      Marland Kitchen FINGERSTIX LANCETS MISC Use to check BG up to bid.  Dx:  250.00 100 each 1  . fish oil-omega-3 fatty acids 1000 MG capsule Take 2 g by mouth daily.      . fluticasone (FLONASE) 50 MCG/ACT nasal spray 2 sprays by Nasal route daily. 1 spray each nostril qd     . Fluticasone-Salmeterol (ADVAIR DISKUS) 250-50 MCG/DOSE AEPB Inhale 1 puff into the lungs every 12 (twelve) hours.      . gabapentin (NEURONTIN) 800 MG tablet Take 800 mg by mouth 4 (four) times daily.     Marland Kitchen glimepiride (AMARYL) 4 MG tablet Take 1 tablet (4 mg total) by mouth daily before breakfast. Not change in tablet strength from 2mg  to 4mg  180 tablet 1  . glucose blood (TRUETRACK TEST) test strip Use to check BG up to bid.  Dx:  250.00 100 each 1  . lidocaine (LIDODERM) 5 % Place 3 patches onto the skin daily. Apply up to 3 patches to area of pain for 12 hour, then Remove & Discard patch.  May reapply patch(es) after being patch free for  12 hours. 270 patch 0  . lisinopril (PRINIVIL,ZESTRIL) 10 MG tablet Take 1 tablet (10 mg total) by mouth daily. 30 tablet 3  . methocarbamol (ROBAXIN) 500 MG tablet Take 500 mg by mouth 3 (three) times daily as needed (for muscle spasms).     . Multiple Vitamin (MULTIVITAMIN WITH MINERALS) TABS tablet Take 1 tablet by mouth daily.    . sertraline (ZOLOFT) 100 MG tablet Take 100 mg by mouth 2 (two) times daily.     . Vitamin D, Ergocalciferol, (DRISDOL) 50000 UNITS CAPS 50,000 Units every 7 (seven) days.      No current facility-administered medications on file prior to visit.    ROS Review of Systems  Constitutional: Negative for fever, chills and  diaphoresis.  HENT: Negative for congestion.   Eyes: Negative for visual disturbance.  Respiratory: Negative for cough and shortness of breath.   Cardiovascular: Negative for chest pain and palpitations.  Gastrointestinal: Positive for abdominal pain (llq to suprapubic area). Negative for nausea, diarrhea and constipation.  Genitourinary: Positive for dysuria, urgency and frequency. Negative for hematuria, flank pain, decreased urine volume, menstrual problem and pelvic pain.  Musculoskeletal: Negative for joint swelling and arthralgias.  Skin: Negative for rash.  Neurological: Negative for dizziness and numbness.    Objective:  BP 110/68 mmHg  Pulse 88  Temp(Src) 98.8 F (37.1 C) (Oral)  Ht 5\' 1"  (8.333 m)  Wt 216 lb (97.977 kg)  BMI 40.83 kg/m2  BP Readings from Last 3 Encounters:  02/08/15 110/68  02/08/15 110/68  09/07/14 112/70    Wt Readings from Last 3 Encounters:  02/08/15 216 lb (97.977 kg)  02/08/15 216 lb (97.977 kg)  09/07/14 219 lb 12.8 oz (99.701 kg)     Physical Exam  Constitutional: She is oriented to person, place, and time. She appears well-developed and well-nourished. No distress.  HENT:  Head: Normocephalic and atraumatic.  Mouth/Throat: Oropharynx is clear and moist.  Eyes: Conjunctivae are normal. Pupils are equal, round, and reactive to light.  Cardiovascular: Normal rate, regular rhythm and normal heart sounds.   No murmur heard. Pulmonary/Chest: Effort normal and breath sounds normal. No respiratory distress. She has no wheezes. She has no rales.  Abdominal: Soft. Bowel sounds are normal. She exhibits no distension and no mass. There is tenderness (mild at LLQ. Not localized, but more prominent near suprapubic area). There is no rebound and no guarding.  Neurological: She is alert and oriented to person, place, and time. She has normal reflexes.  Skin: Skin is warm and dry.  Psychiatric: She has a normal mood and affect. Her behavior is normal.  Judgment and thought content normal.    Lab Results  Component Value Date   HGBA1C 8.0 02/08/2015   HGBA1C 7.4 11/07/2014   HGBA1C 10.9% 07/13/2014    Lab Results  Component Value Date   WBC 9.4 08/22/2013   HGB 14.0 08/22/2013   HCT 42.8 08/22/2013   PLT 204 07/07/2013   GLUCOSE 177* 02/08/2015   CHOL 137 02/08/2015   TRIG 243* 02/08/2015   HDL 38* 02/08/2015   LDLCALC 50 02/08/2015   ALT 38* 02/08/2015   AST 39 02/08/2015   NA 138 02/08/2015   K 4.8 02/08/2015   CL 98 02/08/2015   CREATININE 0.73 02/08/2015   BUN 12 02/08/2015   CO2 26 02/08/2015   HGBA1C 8.0 02/08/2015    Dg Chest 2 View  07/07/2013   CLINICAL DATA:  Chest pain, shortness of breath, cough.  EXAM:  CHEST  2 VIEW  COMPARISON:  None.  FINDINGS: Minimal linear densities in both lung bases, scarring versus atelectasis. Heart is upper limits normal in size. No acute opacities or effusions. No acute bony abnormality.  IMPRESSION: Bibasilar atelectasis or scarring.   Electronically Signed   By: Rolm Baptise M.D.   On: 07/07/2013 12:08   Ct Angio Chest W/cm &/or Wo Cm  07/07/2013   CLINICAL DATA:  Left upper chest pain. Left arm pain.  EXAM: CT ANGIOGRAPHY CHEST WITH CONTRAST  TECHNIQUE: Multidetector CT imaging of the chest was performed using the standard protocol during bolus administration of intravenous contrast. Multiplanar CT image reconstructions including MIPs were obtained to evaluate the vascular anatomy.  CONTRAST:  134mL OMNIPAQUE IOHEXOL 350 MG/ML SOLN  COMPARISON:  None.  FINDINGS: No filling defects in the pulmonary arteries to suggest pulmonary emboli. Dependent atelectasis in the lungs bilaterally. Heart is borderline in size. Mild vascular congestion. No pleural effusions.  No mediastinal, hilar, or axillary adenopathy. Chest wall soft tissues unremarkable. Imaging into the upper abdomen shows no acute findings. No acute bony abnormality.  Review of the MIP images confirms the above findings.   IMPRESSION: No evidence of pulmonary embolus.  Mild vascular congestion.  Dependent atelectasis.   Electronically Signed   By: Rolm Baptise M.D.   On: 07/07/2013 16:05    Assessment & Plan:   Tyara was seen today for abdominal pain.  Diagnoses and all orders for this visit:  Cystitis  Other orders -     ciprofloxacin (CIPRO) 500 MG tablet; Take 1 tablet (500 mg total) by mouth 2 (two) times daily.   I am having Ms. Sauber start on ciprofloxacin. I am also having her maintain her gabapentin, clonazePAM, Fluticasone-Salmeterol, fexofenadine, fluticasone, fish oil-omega-3 fatty acids, aspirin, Vitamin D (Ergocalciferol), methocarbamol, sertraline, multivitamin with minerals, fentaNYL, diclofenac sodium, lidocaine, amitriptyline, Fenofibrate, glucose blood, FINGERSTIX LANCETS, albuterol, famotidine, atorvastatin, lisinopril, Dulaglutide, and glimepiride.  Meds ordered this encounter  Medications  . ciprofloxacin (CIPRO) 500 MG tablet    Sig: Take 1 tablet (500 mg total) by mouth 2 (two) times daily.    Dispense:  20 tablet    Refill:  0     Follow-up: Return if symptoms worsen or fail to improve.  Claretta Fraise, M.D.

## 2015-02-09 LAB — LIPASE: Lipase: 31 U/L (ref 0–59)

## 2015-02-09 LAB — MICROALBUMIN, URINE: Microalbumin, Urine: 7.4 ug/mL (ref 0.0–17.0)

## 2015-02-09 LAB — VOLATILES,BLD-ACETONE,ETHANOL,ISOPROP,METHANOL
Acetone, blood: NEGATIVE % (ref 0.000–0.010)
ETHANOL, BLOOD: NEGATIVE % (ref 0.000–0.010)
ISOPROPANOL, BLOOD: NEGATIVE % (ref 0.000–0.010)
Methanol, blood: NEGATIVE % (ref 0.000–0.010)

## 2015-02-09 LAB — CMP14+EGFR
ALT: 38 IU/L — AB (ref 0–32)
AST: 39 IU/L (ref 0–40)
Albumin/Globulin Ratio: 1.8 (ref 1.1–2.5)
Albumin: 4.4 g/dL (ref 3.5–5.5)
Alkaline Phosphatase: 58 IU/L (ref 39–117)
BUN/Creatinine Ratio: 16 (ref 9–23)
BUN: 12 mg/dL (ref 6–24)
Bilirubin Total: 0.4 mg/dL (ref 0.0–1.2)
CALCIUM: 9.7 mg/dL (ref 8.7–10.2)
CO2: 26 mmol/L (ref 18–29)
Chloride: 98 mmol/L (ref 97–108)
Creatinine, Ser: 0.73 mg/dL (ref 0.57–1.00)
GFR, EST AFRICAN AMERICAN: 107 mL/min/{1.73_m2} (ref >59)
GFR, EST NON AFRICAN AMERICAN: 93 mL/min/{1.73_m2} (ref >59)
GLUCOSE: 177 mg/dL — AB (ref 65–99)
Globulin, Total: 2.5 g/dL (ref 1.5–4.5)
Potassium: 4.8 mmol/L (ref 3.5–5.2)
SODIUM: 138 mmol/L (ref 134–144)
Total Protein: 6.9 g/dL (ref 6.0–8.5)

## 2015-02-09 LAB — LIPID PANEL
Chol/HDL Ratio: 3.6 ratio units (ref 0.0–4.4)
Cholesterol, Total: 137 mg/dL (ref 100–199)
HDL: 38 mg/dL — ABNORMAL LOW (ref >39)
LDL Calculated: 50 mg/dL (ref 0–99)
Triglycerides: 243 mg/dL — ABNORMAL HIGH (ref 0–149)
VLDL Cholesterol Cal: 49 mg/dL — ABNORMAL HIGH (ref 5–40)

## 2015-02-09 LAB — AMYLASE: Amylase: 53 U/L (ref 31–124)

## 2015-02-10 LAB — URINE CULTURE

## 2015-03-03 ENCOUNTER — Other Ambulatory Visit: Payer: Self-pay | Admitting: Family Medicine

## 2015-03-13 ENCOUNTER — Other Ambulatory Visit: Payer: Self-pay | Admitting: Family Medicine

## 2015-03-15 ENCOUNTER — Telehealth: Payer: Self-pay | Admitting: Pharmacist

## 2015-03-15 MED ORDER — DULAGLUTIDE 1.5 MG/0.5ML ~~LOC~~ SOAJ: 1.5000 mg | SUBCUTANEOUS | Status: DC

## 2015-03-15 NOTE — Telephone Encounter (Signed)
4 pens of Trulicity 1.5mg  left in refrig for patient.  Patient notified.

## 2015-03-22 ENCOUNTER — Ambulatory Visit: Payer: Self-pay

## 2015-04-16 ENCOUNTER — Telehealth: Payer: Self-pay | Admitting: Pharmacist

## 2015-04-16 MED ORDER — DULAGLUTIDE 1.5 MG/0.5ML ~~LOC~~ SOAJ: 1.5000 mg | SUBCUTANEOUS | Status: DC

## 2015-04-16 NOTE — Telephone Encounter (Signed)
#  2 Truclicity 1.5mg /ml left at in refrig and patient notified.

## 2015-04-25 ENCOUNTER — Other Ambulatory Visit: Payer: Self-pay | Admitting: Family Medicine

## 2015-04-25 ENCOUNTER — Encounter: Payer: Self-pay | Admitting: *Deleted

## 2015-05-04 ENCOUNTER — Other Ambulatory Visit: Payer: Self-pay | Admitting: *Deleted

## 2015-05-04 MED ORDER — GLIMEPIRIDE 4 MG PO TABS: 4.0000 mg | ORAL_TABLET | Freq: Two times a day (BID) | ORAL | Status: DC

## 2015-05-23 ENCOUNTER — Other Ambulatory Visit: Payer: Self-pay | Admitting: *Deleted

## 2015-05-23 MED ORDER — GLIMEPIRIDE 4 MG PO TABS: 4.0000 mg | ORAL_TABLET | Freq: Two times a day (BID) | ORAL | Status: DC

## 2015-05-24 ENCOUNTER — Ambulatory Visit: Payer: Self-pay | Admitting: Family Medicine

## 2015-05-31 ENCOUNTER — Ambulatory Visit (INDEPENDENT_AMBULATORY_CARE_PROVIDER_SITE_OTHER): Payer: 59

## 2015-05-31 ENCOUNTER — Ambulatory Visit (INDEPENDENT_AMBULATORY_CARE_PROVIDER_SITE_OTHER): Payer: 59 | Admitting: Family Medicine

## 2015-05-31 ENCOUNTER — Encounter: Payer: Self-pay | Admitting: Family Medicine

## 2015-05-31 VITALS — BP 118/74 | HR 114 | Temp 97.4°F | Ht 61.0 in | Wt 213.0 lb

## 2015-05-31 DIAGNOSIS — M25572 Pain in left ankle and joints of left foot: Secondary | ICD-10-CM | POA: Diagnosis not present

## 2015-05-31 DIAGNOSIS — E1149 Type 2 diabetes mellitus with other diabetic neurological complication: Secondary | ICD-10-CM

## 2015-05-31 LAB — POCT GLYCOSYLATED HEMOGLOBIN (HGB A1C): HEMOGLOBIN A1C: 7.3

## 2015-05-31 IMAGING — CR DG FOOT COMPLETE 3+V*L*
3 series · 3 of 3 positions shown · non-contrast
Comparison: None.

CLINICAL DATA: Injury.

EXAM:
LEFT FOOT - COMPLETE 3+ VIEW

[view not recorded (1 of 3)]
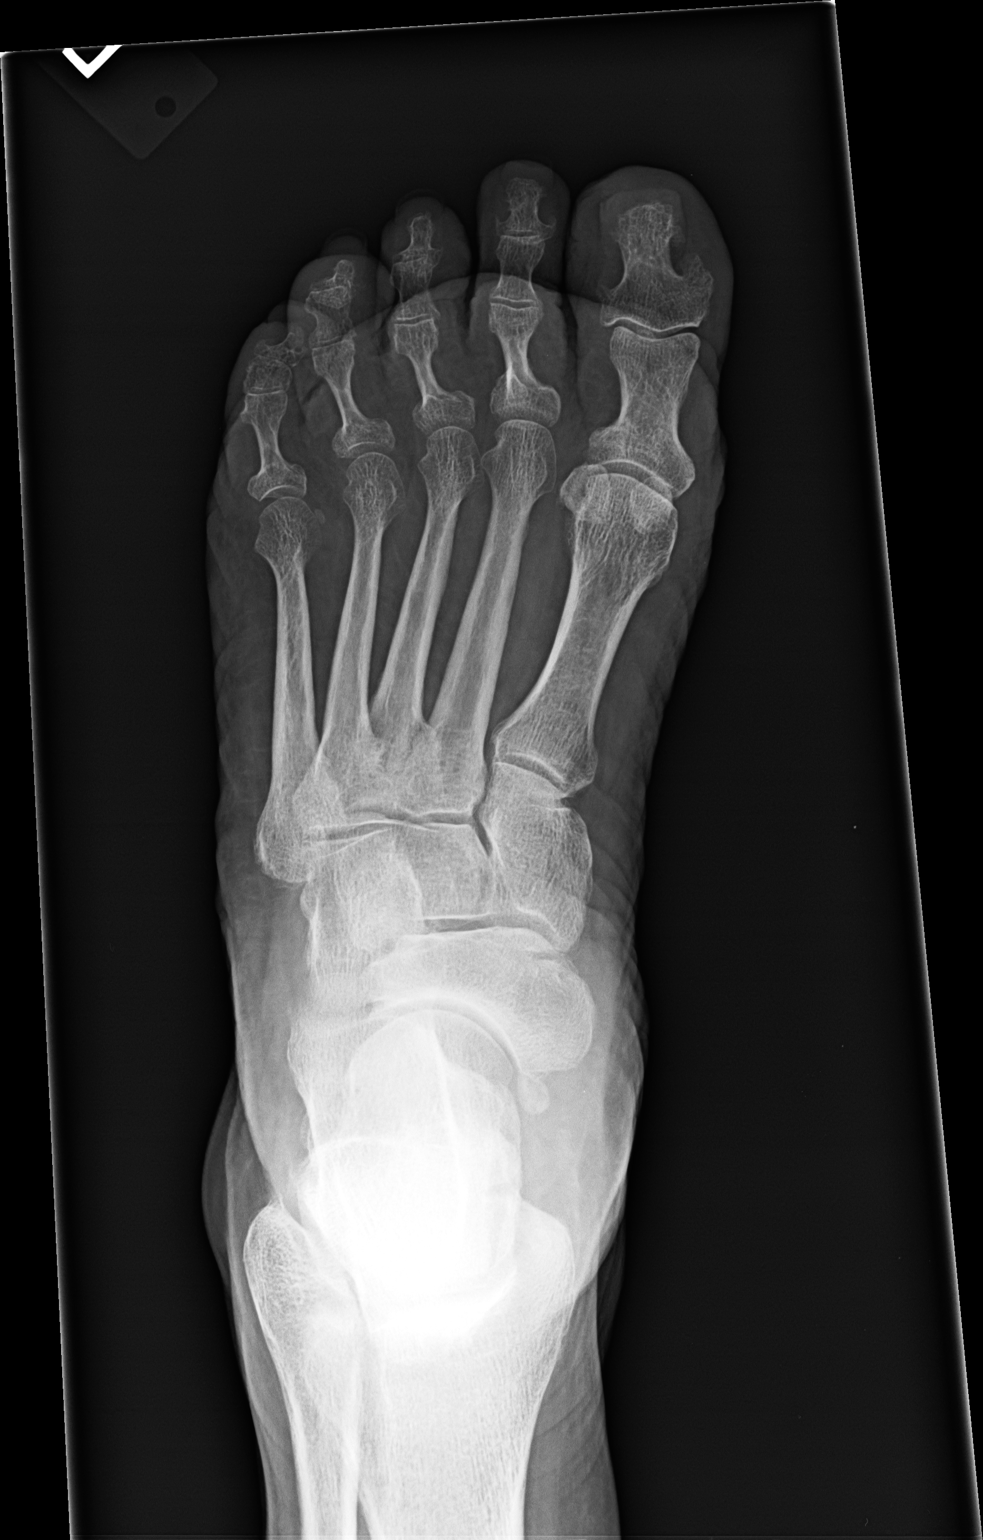

[view not recorded (2 of 3)]
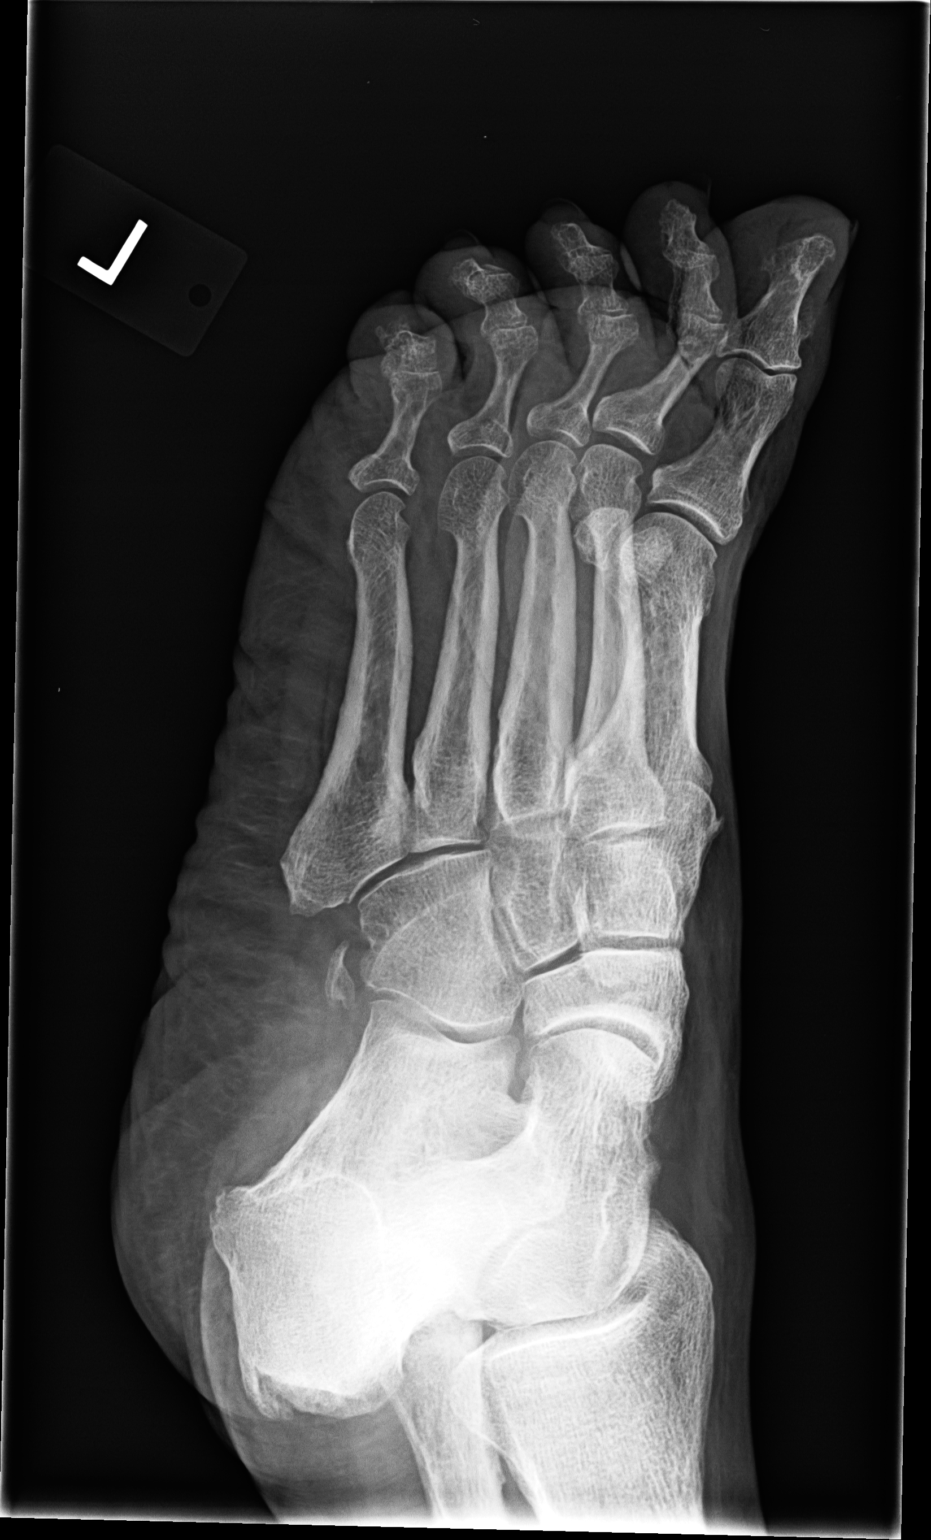

[view not recorded (3 of 3)]
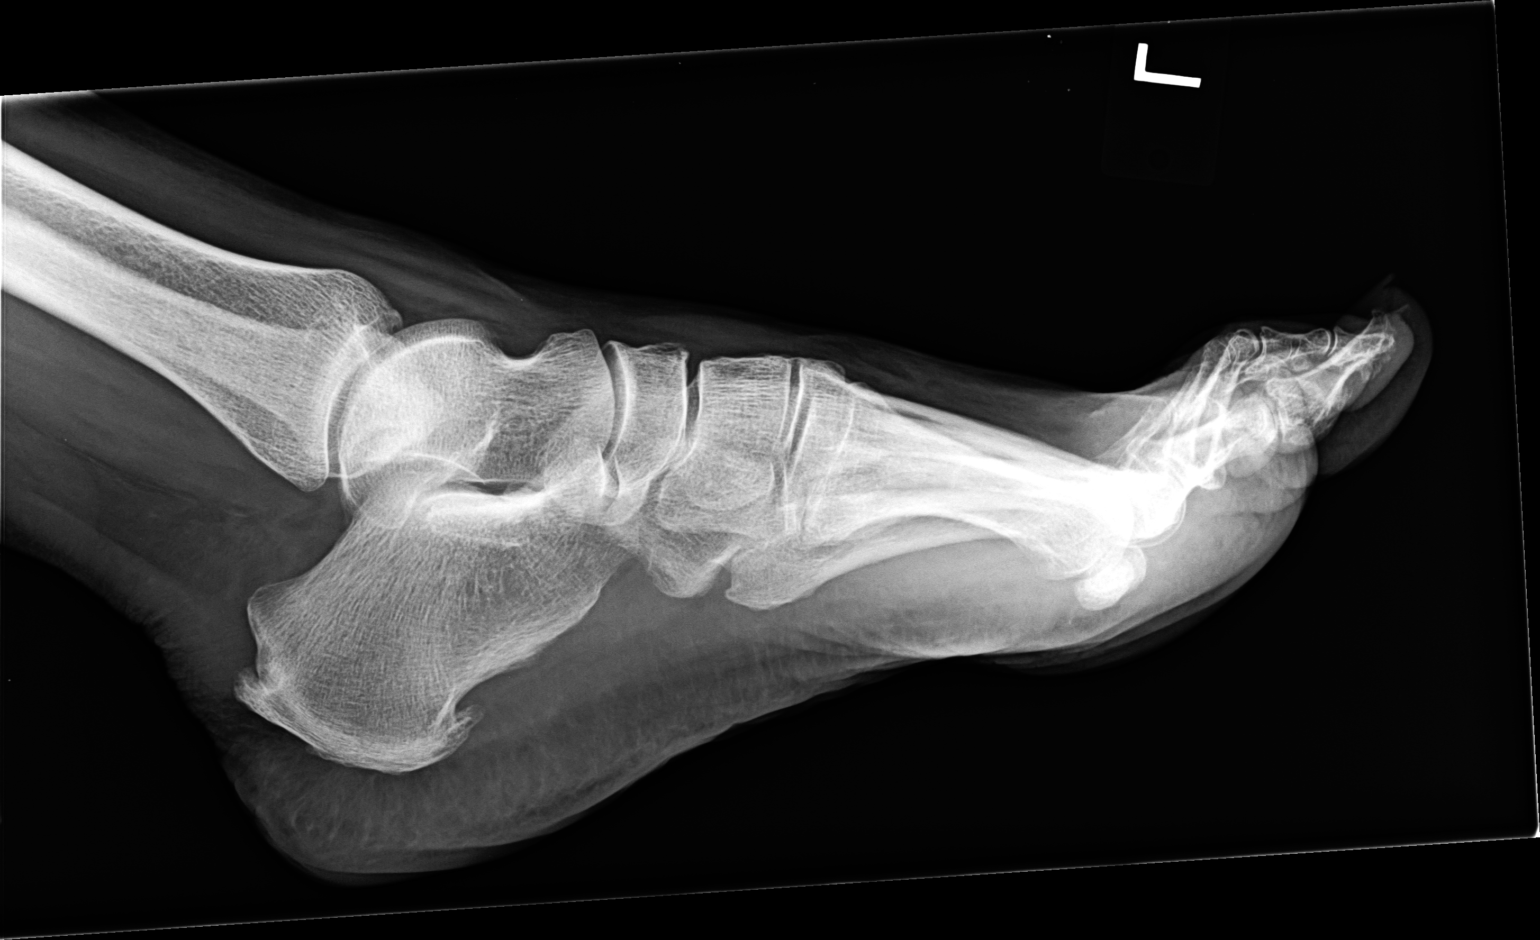

[3 of 3 positions shown; findings below may reference images not displayed]

FINDINGS: Diffuse degenerative change noted of the left foot. No evidence of
fracture or dislocation.
IMPRESSION: No acute abnormality.

## 2015-05-31 MED ORDER — DULAGLUTIDE 1.5 MG/0.5ML ~~LOC~~ SOAJ: 1.5000 mg | SUBCUTANEOUS | Status: DC

## 2015-05-31 MED ORDER — FLUTICASONE FUROATE-VILANTEROL 100-25 MCG/INH IN AEPB: INHALATION_SPRAY | RESPIRATORY_TRACT | Status: DC

## 2015-05-31 NOTE — Progress Notes (Signed)
Subjective:    Patient ID: Casey Sanders, female    DOB: 08-29-1961, 54 y.o.   MRN: [id number]  HPI to 3-year-old female here to follow-up multiple medical problems. She has fibromyalgia, diabetes, COPD, and hypertension. She has seen rheumatologist for fibromyalgia and currently is involved in the pain clinic who give her fentanyl and also prescribe amitriptyline muscle relaxant and gabapentin. She sees psychiatry who provides Zoloft and Klonopin. Her diabetes is treated with a GLP-1 receptor agonist and a sulfonylurea. Last A1c was 8.0 which had gone up from 7.  Today she also complains of pain in her left foot. She kicked a granddaughter recently and has pain in her fourth toe but also has pain posteriorly or in the Achilles area with a thickening in that area.  Patient Active Problem List   Diagnosis Date Noted  . Cystitis 02/08/2015  . Rectal bleeding 08/22/2013  . Need for prophylactic vaccination and inoculation against influenza 08/22/2013  . Diabetic neuropathy, type II diabetes mellitus 03/17/2013  . Chest pain, unspecified 02/12/2011  . Syncope 02/12/2011  . Pre-operative cardiovascular examination 02/12/2011  . Diabetes mellitus 01/04/2011  . Chronic pain 01/04/2011  . Diverticul disease small and large intestine, no perforati or abscess 01/04/2011  . Benign essential HTN 01/04/2011  . Hyperlipidemia 01/04/2011  . Restless leg syndrome 01/04/2011  . Cervical adenocarcinoma 01/04/2011  . Palpitations 01/04/2011   Outpatient Encounter Prescriptions as of 05/31/2015  Medication Sig  . albuterol (PROVENTIL HFA;VENTOLIN HFA) 108 (90 BASE) MCG/ACT inhaler Inhale 2 puffs into the lungs every 8 (eight) hours as needed for wheezing or shortness of breath.  Marland Kitchen amitriptyline (ELAVIL) 25 MG tablet Take one or two tablets at bedtime.  Marland Kitchen aspirin 81 MG EC tablet Take 81 mg by mouth daily.    Marland Kitchen atorvastatin (LIPITOR) 80 MG tablet TAKE ONE TABLET BY MOUTH ONCE DAILY IN THE EVENING  .  ciprofloxacin (CIPRO) 500 MG tablet Take 1 tablet (500 mg total) by mouth 2 (two) times daily.  . clonazePAM (KLONOPIN) 2 MG tablet Take 2 mg by mouth 5 (five) times daily.    . diclofenac sodium (VOLTAREN) 1 % GEL Apply 2 g topically 4 (four) times daily.  . Dulaglutide (TRULICITY) 1.5 WL/7.9GX SOPN Inject 1.5 mg into the skin once a week.  . famotidine (PEPCID) 20 MG tablet TAKE ONE TABLET BY MOUTH TWICE DAILY  . Fenofibrate 150 MG CAPS TAKE 1 CAPSULE (150 MG TOTAL) BY MOUTH DAILY.  . fentaNYL (DURAGESIC - DOSED MCG/HR) 50 MCG/HR   . fexofenadine (ALLEGRA) 60 MG tablet Take 60 mg by mouth 2 (two) times daily.    Marland Kitchen FINGERSTIX LANCETS MISC Use to check BG up to bid.  Dx:  250.00  . fish oil-omega-3 fatty acids 1000 MG capsule Take 2 g by mouth daily.    . fluticasone (FLONASE) 50 MCG/ACT nasal spray 2 sprays by Nasal route daily. 1 spray each nostril qd   . Fluticasone-Salmeterol (ADVAIR DISKUS) 250-50 MCG/DOSE AEPB Inhale 1 puff into the lungs every 12 (twelve) hours.    . gabapentin (NEURONTIN) 800 MG tablet Take 800 mg by mouth 4 (four) times daily.   Marland Kitchen glimepiride (AMARYL) 4 MG tablet Take 1 tablet (4 mg total) by mouth 2 (two) times daily.  Marland Kitchen glucose blood (TRUETRACK TEST) test strip Use to check BG up to bid.  Dx:  250.00  . lidocaine (LIDODERM) 5 % Place 3 patches onto the skin daily. Apply up to 3 patches to area of pain  for 12 hour, then Remove & Discard patch.  May reapply patch(es) after being patch free for 12 hours.  Marland Kitchen lisinopril (PRINIVIL,ZESTRIL) 10 MG tablet TAKE ONE TABLET BY MOUTH ONCE DAILY  . methocarbamol (ROBAXIN) 500 MG tablet Take 500 mg by mouth 3 (three) times daily as needed (for muscle spasms).   . Multiple Vitamin (MULTIVITAMIN WITH MINERALS) TABS tablet Take 1 tablet by mouth daily.  . sertraline (ZOLOFT) 100 MG tablet Take 100 mg by mouth 2 (two) times daily.   . Vitamin D, Ergocalciferol, (DRISDOL) 50000 UNITS CAPS 50,000 Units every 7 (seven) days.    No  facility-administered encounter medications on file as of 05/31/2015.       Review of Systems  Constitutional: Negative.   Respiratory: Positive for shortness of breath.   Cardiovascular: Negative.   Gastrointestinal: Negative.   Endocrine: Negative.   Neurological: Negative.   Psychiatric/Behavioral: The patient is nervous/anxious.        Objective:   Physical Exam  Constitutional: She is oriented to person, place, and time. She appears well-developed and well-nourished.  Cardiovascular: Normal rate and regular rhythm.   Pulmonary/Chest: Effort normal.  Musculoskeletal:  Left foot: Fourth toe is purple and tender to palpation. There is also thickening and tenderness over the Achilles tendon and heel.  Neurological: She is alert and oriented to person, place, and time.    BP 118/74 mmHg  Pulse 114  Temp(Src) 97.4 F (36.3 C) (Oral)  Ht 5\' 1"  (1.549 m)  Wt 213 lb (96.616 kg)  BMI 40.27 kg/m2       Assessment & Plan:  1. Type 2 diabetes mellitus with other diabetic neurological complication Continue with Trulicity. - POCT glycosylated hemoglobin (Hb A1C)  2. Pain in joint, ankle and foot, left X-ray shows spurring both the superior and inferior posterior aspects of the calcaneus. There is no fracture in the toe so I think this was bruised and sprained and would still recommend buddy taping for 1-2 weeks - DG Foot Complete Left; Future 3. COPD Sample of Breo since she is using albuterol inhaler multiple times a day.  Wardell Honour MD

## 2015-07-05 ENCOUNTER — Telehealth: Payer: Self-pay | Admitting: Pharmacist

## 2015-07-06 MED ORDER — DULAGLUTIDE 1.5 MG/0.5ML ~~LOC~~ SOAJ: 1.5000 mg | SUBCUTANEOUS | Status: DC

## 2015-07-06 NOTE — Telephone Encounter (Signed)
Left #1 box of trulicity for patient in refridge.  She also c/o increase in her BG since starting Breo which is not usual but could happen.  She reports that BG was in 120's and recently she has had readings in the 200's and 300's .  She also reports having similar effects but not as severe with Advair in past.  I instructed patient to stop Breo.   I will forward Dr Sabra Heck note for recommendations (possibly spiriva  Or long acting beta agonist)

## 2015-07-11 NOTE — Telephone Encounter (Signed)
We can call in prescription for Spiriva 1 puff daily

## 2015-07-12 ENCOUNTER — Telehealth: Payer: Self-pay | Admitting: Family Medicine

## 2015-07-12 MED ORDER — UMECLIDINIUM BROMIDE 62.5 MCG/INH IN AEPB: 1.0000 | INHALATION_SPRAY | Freq: Every day | RESPIRATORY_TRACT | Status: DC

## 2015-07-12 NOTE — Telephone Encounter (Signed)
Will give her sample of Incruse to try since we dont have samples of Spiriva.

## 2015-07-30 ENCOUNTER — Telehealth: Payer: Self-pay | Admitting: Pharmacist

## 2015-08-01 MED ORDER — DULAGLUTIDE 1.5 MG/0.5ML ~~LOC~~ SOAJ: 1.5000 mg | SUBCUTANEOUS | Status: DC

## 2015-08-01 NOTE — Telephone Encounter (Signed)
#   2 boxes or 4 syringes left for patient in sample refrig.

## 2015-08-09 ENCOUNTER — Other Ambulatory Visit: Payer: Self-pay | Admitting: Nurse Practitioner

## 2015-08-14 ENCOUNTER — Other Ambulatory Visit: Payer: Self-pay | Admitting: Family Medicine

## 2015-08-29 ENCOUNTER — Telehealth: Payer: Self-pay | Admitting: Pharmacist

## 2015-08-29 MED ORDER — DULAGLUTIDE 1.5 MG/0.5ML ~~LOC~~ SOAJ: 1.5000 mg | SUBCUTANEOUS | Status: DC

## 2015-08-29 NOTE — Telephone Encounter (Signed)
Patient notified then Trulicity samples #2 left for her in lab refrig.

## 2015-09-05 ENCOUNTER — Ambulatory Visit: Payer: 59 | Admitting: Family Medicine

## 2015-09-12 ENCOUNTER — Ambulatory Visit: Payer: 59 | Admitting: Family Medicine

## 2015-09-26 ENCOUNTER — Telehealth: Payer: Self-pay | Admitting: Family Medicine

## 2015-09-27 MED ORDER — DULAGLUTIDE 1.5 MG/0.5ML ~~LOC~~ SOAJ: 1.5000 mg | SUBCUTANEOUS | Status: DC

## 2015-09-27 NOTE — Telephone Encounter (Signed)
#  4 syringes of Trulicity 1.5mg  / ml left for patient in refrigerator. Patient notified

## 2015-10-11 ENCOUNTER — Ambulatory Visit: Payer: 59 | Admitting: Family Medicine

## 2015-10-30 ENCOUNTER — Telehealth: Payer: Self-pay | Admitting: Family Medicine

## 2015-10-30 NOTE — Telephone Encounter (Signed)
Patient aware that we do not have samples via detailed message

## 2015-11-05 ENCOUNTER — Telehealth: Payer: Self-pay | Admitting: Pharmacist

## 2015-11-05 MED ORDER — DULAGLUTIDE 1.5 MG/0.5ML ~~LOC~~ SOAJ: 1.5000 mg | SUBCUTANEOUS | Status: DC

## 2015-11-05 NOTE — Telephone Encounter (Signed)
#  2 boxes os samples or 4 doses left for patient at front desk.

## 2015-11-06 ENCOUNTER — Other Ambulatory Visit: Payer: Self-pay | Admitting: Family Medicine

## 2015-12-06 ENCOUNTER — Ambulatory Visit: Payer: 59 | Admitting: Family Medicine

## 2015-12-07 ENCOUNTER — Other Ambulatory Visit: Payer: Self-pay | Admitting: Family Medicine

## 2015-12-10 ENCOUNTER — Ambulatory Visit (INDEPENDENT_AMBULATORY_CARE_PROVIDER_SITE_OTHER): Payer: 59 | Admitting: Family Medicine

## 2015-12-10 ENCOUNTER — Encounter: Payer: Self-pay | Admitting: Family Medicine

## 2015-12-10 VITALS — BP 121/75 | HR 98 | Temp 97.2°F | Ht 61.0 in | Wt 216.0 lb

## 2015-12-10 DIAGNOSIS — I10 Essential (primary) hypertension: Secondary | ICD-10-CM | POA: Diagnosis not present

## 2015-12-10 DIAGNOSIS — R8290 Unspecified abnormal findings in urine: Secondary | ICD-10-CM

## 2015-12-10 DIAGNOSIS — E785 Hyperlipidemia, unspecified: Secondary | ICD-10-CM

## 2015-12-10 DIAGNOSIS — E1149 Type 2 diabetes mellitus with other diabetic neurological complication: Secondary | ICD-10-CM | POA: Diagnosis not present

## 2015-12-10 DIAGNOSIS — E559 Vitamin D deficiency, unspecified: Secondary | ICD-10-CM

## 2015-12-10 DIAGNOSIS — R8299 Other abnormal findings in urine: Secondary | ICD-10-CM | POA: Diagnosis not present

## 2015-12-10 DIAGNOSIS — R82998 Other abnormal findings in urine: Secondary | ICD-10-CM

## 2015-12-10 LAB — POCT URINALYSIS DIPSTICK
Bilirubin, UA: NEGATIVE
Blood, UA: NEGATIVE
Glucose, UA: NEGATIVE
KETONES UA: NEGATIVE
NITRITE UA: NEGATIVE
PH UA: 5
PROTEIN UA: NEGATIVE
Spec Grav, UA: 1.02
UROBILINOGEN UA: NEGATIVE

## 2015-12-10 LAB — POCT UA - MICROSCOPIC ONLY
Casts, Ur, LPF, POC: NEGATIVE
Crystals, Ur, HPF, POC: NEGATIVE
YEAST UA: NEGATIVE

## 2015-12-10 LAB — POCT GLYCOSYLATED HEMOGLOBIN (HGB A1C): Hemoglobin A1C: 8.4

## 2015-12-10 MED ORDER — GLIMEPIRIDE 4 MG PO TABS: 4.0000 mg | ORAL_TABLET | Freq: Three times a day (TID) | ORAL | Status: DC

## 2015-12-10 MED ORDER — LISINOPRIL 10 MG PO TABS: 10.0000 mg | ORAL_TABLET | Freq: Every day | ORAL | Status: DC

## 2015-12-10 NOTE — Progress Notes (Signed)
Subjective:    Patient ID: Casey Sanders, female    DOB: [birth year], 55 y.o.   MRN: [id number]  HPI Pt here for follow up and management of chronic medical problems which includes diabetes and hypertension. She is taking medications regularly. She has been out of her triglycerides he, depending on samples from here. For about a week. Last A1c was pretty good at 7.3. Recently she's had some dark urine with odor but denies any frequency or dysuria. She is also had some dyspnea at night. She attributes this to panic disorder. She is followed by pain clinic for chronic back pain as well as fibromyalgia.      Patient Active Problem List   Diagnosis Date Noted  . Cystitis 02/08/2015  . Rectal bleeding 08/22/2013  . Need for prophylactic vaccination and inoculation against influenza 08/22/2013  . Diabetic neuropathy, type II diabetes mellitus ([place]) 03/17/2013  . Chest pain, unspecified 02/12/2011  . Syncope 02/12/2011  . Pre-operative cardiovascular examination 02/12/2011  . Diabetes mellitus ([place]) 01/04/2011  . Chronic pain 01/04/2011  . Diverticul disease small and large intestine, no perforati or abscess 01/04/2011  . Benign essential HTN 01/04/2011  . Hyperlipidemia 01/04/2011  . Restless leg syndrome 01/04/2011  . Cervical adenocarcinoma ([place]) 01/04/2011  . Palpitations 01/04/2011   Outpatient Encounter Prescriptions as of 12/10/2015  Medication Sig  . albuterol (PROVENTIL HFA;VENTOLIN HFA) 108 (90 BASE) MCG/ACT inhaler Inhale 2 puffs into the lungs every 8 (eight) hours as needed for wheezing or shortness of breath.  Marland Kitchen amitriptyline (ELAVIL) 25 MG tablet Take one or two tablets at bedtime.  Marland Kitchen aspirin 81 MG EC tablet Take 81 mg by mouth daily.    Marland Kitchen atorvastatin (LIPITOR) 80 MG tablet TAKE ONE TABLET BY MOUTH ONCE DAILY IN THE EVENING  . clonazePAM (KLONOPIN) 2 MG tablet Take 2 mg by mouth 5 (five) times daily.    . cyclobenzaprine (FLEXERIL) 10 MG tablet Take 1 tablet by mouth 3  (three) times daily as needed.  . diclofenac sodium (VOLTAREN) 1 % GEL Apply 2 g topically 4 (four) times daily.  . Dulaglutide (TRULICITY) 1.5 QM/0.8QP SOPN Inject 1.5 mg into the skin once a week.  . famotidine (PEPCID) 20 MG tablet TAKE ONE TABLET BY MOUTH TWICE DAILY  . Fenofibrate 150 MG CAPS TAKE 1 CAPSULE (150 MG TOTAL) BY MOUTH DAILY.  . fentaNYL (DURAGESIC - DOSED MCG/HR) 50 MCG/HR   . fexofenadine (ALLEGRA) 60 MG tablet Take 60 mg by mouth 2 (two) times daily.    Marland Kitchen FINGERSTIX LANCETS MISC Use to check BG up to bid.  Dx:  250.00  . fish oil-omega-3 fatty acids 1000 MG capsule Take 2 g by mouth daily.    . fluticasone (FLONASE) 50 MCG/ACT nasal spray 2 sprays by Nasal route daily. 1 spray each nostril qd   . Fluticasone-Salmeterol (ADVAIR DISKUS) 250-50 MCG/DOSE AEPB Inhale 1 puff into the lungs every 12 (twelve) hours.    . gabapentin (NEURONTIN) 800 MG tablet Take 800 mg by mouth 4 (four) times daily.   Marland Kitchen glimepiride (AMARYL) 4 MG tablet Take 1 tablet (4 mg total) by mouth 3 (three) times daily. As directed  . glucose blood (TRUETRACK TEST) test strip Use to check BG up to bid.  Dx:  250.00  . lidocaine (LIDODERM) 5 % Place 3 patches onto the skin daily. Apply up to 3 patches to area of pain for 12 hour, then Remove & Discard patch.  May reapply patch(es) after being patch free  for 12 hours.  Marland Kitchen lisinopril (PRINIVIL,ZESTRIL) 10 MG tablet Take 1 tablet (10 mg total) by mouth daily.  . Multiple Vitamin (MULTIVITAMIN WITH MINERALS) TABS tablet Take 1 tablet by mouth daily.  . sertraline (ZOLOFT) 100 MG tablet Take 100 mg by mouth 2 (two) times daily.   . Vitamin D, Ergocalciferol, (DRISDOL) 50000 UNITS CAPS 50,000 Units every 7 (seven) days.   . [DISCONTINUED] glimepiride (AMARYL) 4 MG tablet TAKE ONE TABLET BY MOUTH TWICE DAILY (Patient taking differently: take 3 PO QD as directed)  . [DISCONTINUED] lisinopril (PRINIVIL,ZESTRIL) 10 MG tablet TAKE ONE TABLET BY MOUTH ONE TIME DAILY  .  [DISCONTINUED] ciprofloxacin (CIPRO) 500 MG tablet Take 1 tablet (500 mg total) by mouth 2 (two) times daily.  . [DISCONTINUED] Fluticasone Furoate-Vilanterol (BREO ELLIPTA) 100-25 MCG/INH AEPB Use as directed  . [DISCONTINUED] methocarbamol (ROBAXIN) 500 MG tablet Take 500 mg by mouth 3 (three) times daily as needed (for muscle spasms).   . [DISCONTINUED] Umeclidinium Bromide (INCRUSE ELLIPTA) 62.5 MCG/INH AEPB Inhale 1 puff into the lungs daily.   No facility-administered encounter medications on file as of 12/10/2015.      Review of Systems  Constitutional: Negative.   HENT: Negative.   Eyes: Negative.   Respiratory: Negative.        SOB at night  Cardiovascular: Negative.   Gastrointestinal: Negative.   Endocrine: Negative.   Genitourinary: Negative.        Dark urine and odor  Musculoskeletal: Negative.   Skin: Negative.   Allergic/Immunologic: Negative.   Neurological: Negative.   Hematological: Negative.   Psychiatric/Behavioral: Negative.        Objective:   Physical Exam  Constitutional: She is oriented to person, place, and time. She appears well-developed and well-nourished.  Walks with a cane. Left leg is painful and has caused her to fall.  Cardiovascular: Normal rate and regular rhythm.   Pulmonary/Chest: Effort normal and breath sounds normal.  Neurological: She is alert and oriented to person, place, and time.  Psychiatric: She has a normal mood and affect. Her behavior is normal.   BP 121/75 mmHg  Pulse 98  Temp(Src) 97.2 F (36.2 C) (Oral)  Ht 5' 1" (1.549 m)  Wt 216 lb (97.977 kg)  BMI 40.83 kg/m2  SpO2 98%        Assessment & Plan:  1. Type 2 diabetes mellitus with other diabetic neurological complication (HCC) Stressed importance of taking her medicines and following a low-carb diet when she runs out of her medicine - POCT glycosylated hemoglobin (Hb A1C)  2. Hyperlipidemia Last LDL was at goal one year ago. Need to update today - Lipid  panel  3. Vitamin D insufficiency No symptoms at this time  4. Benign essential HTN [place] is well controlled on lisinopril - CMP14+EGFR  5. Abnormal urine odor Does have some white cells and we will culture and treat depending on result of culture - POCT UA - Microscopic Only - POCT urinalysis dipstick - Urine culture  6. Dark urine See above for comment - POCT UA - Microscopic Only - POCT urinalysis dipstick - Urine culture  Wardell Honour MD

## 2015-12-10 NOTE — Patient Instructions (Signed)
Continue current medications. Continue good therapeutic lifestyle changes which include good diet and exercise. Fall precautions discussed with patient. If an FOBT was given today- please return it to our front desk. If you are over 57 years old - you may need Prevnar 13 or the adult Pneumonia vaccine.  **Flu shots are available--- please call and schedule a FLU-CLINIC appointment**  After your visit with us today you will receive a survey in the mail or online from [employer] regarding your care with us. Please take a moment to fill this out. Your feedback is very important to us as you can help us better understand your patient needs as well as improve your experience and satisfaction. WE CARE ABOUT YOU!!!    

## 2015-12-11 LAB — CMP14+EGFR
ALBUMIN: 4.4 g/dL (ref 3.5–5.5)
ALK PHOS: 57 IU/L (ref 39–117)
ALT: 33 IU/L — ABNORMAL HIGH (ref 0–32)
AST: 36 IU/L (ref 0–40)
Albumin/Globulin Ratio: 1.6 (ref 1.1–2.5)
BILIRUBIN TOTAL: 0.7 mg/dL (ref 0.0–1.2)
BUN / CREAT RATIO: 17 (ref 9–23)
BUN: 13 mg/dL (ref 6–24)
CHLORIDE: 100 mmol/L (ref 96–106)
CO2: 23 mmol/L (ref 18–29)
Calcium: 9.8 mg/dL (ref 8.7–10.2)
Creatinine, Ser: 0.78 mg/dL (ref 0.57–1.00)
GFR calc Af Amer: 98 mL/min/{1.73_m2} (ref >59)
GFR calc non Af Amer: 85 mL/min/{1.73_m2} (ref >59)
GLUCOSE: 242 mg/dL — AB (ref 65–99)
Globulin, Total: 2.8 g/dL (ref 1.5–4.5)
Potassium: 4.3 mmol/L (ref 3.5–5.2)
Sodium: 141 mmol/L (ref 134–144)
Total Protein: 7.2 g/dL (ref 6.0–8.5)

## 2015-12-11 LAB — LIPID PANEL
CHOLESTEROL TOTAL: 131 mg/dL (ref 100–199)
Chol/HDL Ratio: 3 ratio units (ref 0.0–4.4)
HDL: 44 mg/dL (ref >39)
LDL Calculated: 61 mg/dL (ref 0–99)
TRIGLYCERIDES: 128 mg/dL (ref 0–149)
VLDL CHOLESTEROL CAL: 26 mg/dL (ref 5–40)

## 2015-12-12 LAB — URINE CULTURE

## 2015-12-17 MED ORDER — CIPROFLOXACIN HCL 250 MG PO TABS: 250.0000 mg | ORAL_TABLET | Freq: Two times a day (BID) | ORAL | Status: DC

## 2015-12-17 NOTE — Addendum Note (Signed)
Addended by: Thana Ates on: 12/17/2015 10:10 AM   Modules accepted: Orders

## 2015-12-24 LAB — HM DIABETES EYE EXAM

## 2015-12-31 ENCOUNTER — Telehealth: Payer: Self-pay | Admitting: Pharmacist

## 2015-12-31 ENCOUNTER — Telehealth: Payer: Self-pay | Admitting: *Deleted

## 2015-12-31 MED ORDER — DULAGLUTIDE 1.5 MG/0.5ML ~~LOC~~ SOAJ: 1.5000 mg | SUBCUTANEOUS | Status: DC

## 2015-12-31 NOTE — Telephone Encounter (Signed)
#  2 pens left for patient in sample refridge

## 2015-12-31 NOTE — Telephone Encounter (Signed)
Patient called in complaining with SOB and chest pain. Patient advised to call 911 for evaluation and transport to ER.

## 2016-01-01 NOTE — Telephone Encounter (Signed)
Patient aware.

## 2016-01-06 ENCOUNTER — Other Ambulatory Visit: Payer: Self-pay | Admitting: Family Medicine

## 2016-01-14 ENCOUNTER — Other Ambulatory Visit: Payer: Self-pay | Admitting: Family Medicine

## 2016-01-22 ENCOUNTER — Telehealth: Payer: Self-pay | Admitting: Pharmacist Clinician (PhC)/ Clinical Pharmacy Specialist

## 2016-01-22 ENCOUNTER — Telehealth: Payer: Self-pay | Admitting: Family Medicine

## 2016-01-22 NOTE — Telephone Encounter (Signed)
Patient to pick up Trulicity samples today.  #2 pens are left for patient to pick up.

## 2016-02-15 ENCOUNTER — Telehealth: Payer: Self-pay | Admitting: Family Medicine

## 2016-02-15 MED ORDER — DULAGLUTIDE 1.5 MG/0.5ML ~~LOC~~ SOAJ: 1.5000 mg | SUBCUTANEOUS | Status: DC

## 2016-02-15 NOTE — Telephone Encounter (Signed)
Patient aware, no samples of trulicity available and script was sent to [facility].

## 2016-02-15 NOTE — Telephone Encounter (Signed)
No samples available - we can send prescription of patient needs.

## 2016-03-14 ENCOUNTER — Ambulatory Visit: Payer: 59 | Admitting: Family Medicine

## 2016-03-18 ENCOUNTER — Other Ambulatory Visit: Payer: 59 | Admitting: Family

## 2016-03-18 ENCOUNTER — Encounter: Payer: 59 | Admitting: *Deleted

## 2016-03-19 ENCOUNTER — Encounter: Payer: Self-pay | Admitting: Family Medicine

## 2016-05-18 ENCOUNTER — Other Ambulatory Visit: Payer: Self-pay | Admitting: Family Medicine

## 2016-05-23 ENCOUNTER — Ambulatory Visit (INDEPENDENT_AMBULATORY_CARE_PROVIDER_SITE_OTHER): Payer: 59 | Admitting: Family

## 2016-05-23 ENCOUNTER — Encounter: Payer: Self-pay | Admitting: Family

## 2016-05-23 VITALS — BP 119/80 | HR 96 | Temp 98.2°F | Ht 61.0 in | Wt 219.8 lb

## 2016-05-23 DIAGNOSIS — Z1159 Encounter for screening for other viral diseases: Secondary | ICD-10-CM

## 2016-05-23 DIAGNOSIS — K219 Gastro-esophageal reflux disease without esophagitis: Secondary | ICD-10-CM | POA: Diagnosis not present

## 2016-05-23 DIAGNOSIS — Z114 Encounter for screening for human immunodeficiency virus [HIV]: Secondary | ICD-10-CM | POA: Diagnosis not present

## 2016-05-23 DIAGNOSIS — F329 Major depressive disorder, single episode, unspecified: Secondary | ICD-10-CM

## 2016-05-23 DIAGNOSIS — G8929 Other chronic pain: Secondary | ICD-10-CM

## 2016-05-23 DIAGNOSIS — F411 Generalized anxiety disorder: Secondary | ICD-10-CM

## 2016-05-23 DIAGNOSIS — B372 Candidiasis of skin and nail: Secondary | ICD-10-CM

## 2016-05-23 DIAGNOSIS — E785 Hyperlipidemia, unspecified: Secondary | ICD-10-CM

## 2016-05-23 DIAGNOSIS — I10 Essential (primary) hypertension: Secondary | ICD-10-CM | POA: Diagnosis not present

## 2016-05-23 DIAGNOSIS — M797 Fibromyalgia: Secondary | ICD-10-CM | POA: Diagnosis not present

## 2016-05-23 DIAGNOSIS — Z1211 Encounter for screening for malignant neoplasm of colon: Secondary | ICD-10-CM

## 2016-05-23 DIAGNOSIS — Z794 Long term (current) use of insulin: Secondary | ICD-10-CM

## 2016-05-23 DIAGNOSIS — E1140 Type 2 diabetes mellitus with diabetic neuropathy, unspecified: Secondary | ICD-10-CM | POA: Diagnosis not present

## 2016-05-23 DIAGNOSIS — F32A Depression, unspecified: Secondary | ICD-10-CM

## 2016-05-23 DIAGNOSIS — E1149 Type 2 diabetes mellitus with other diabetic neurological complication: Secondary | ICD-10-CM | POA: Diagnosis not present

## 2016-05-23 DIAGNOSIS — Z8249 Family history of ischemic heart disease and other diseases of the circulatory system: Secondary | ICD-10-CM | POA: Diagnosis not present

## 2016-05-23 DIAGNOSIS — K224 Dyskinesia of esophagus: Secondary | ICD-10-CM | POA: Insufficient documentation

## 2016-05-23 LAB — BAYER DCA HB A1C WAIVED: HB A1C: 8.7 % — AB (ref <7.0)

## 2016-05-23 MED ORDER — NYSTATIN 100000 UNIT/GM EX POWD: Freq: Four times a day (QID) | CUTANEOUS | 0 refills | Status: DC

## 2016-05-23 MED ORDER — GLIMEPIRIDE 4 MG PO TABS: 4.0000 mg | ORAL_TABLET | Freq: Three times a day (TID) | ORAL | 6 refills | Status: DC

## 2016-05-23 MED ORDER — DULAGLUTIDE 1.5 MG/0.5ML ~~LOC~~ SOAJ: 1.5000 mg | SUBCUTANEOUS | 3 refills | Status: DC

## 2016-05-23 NOTE — Patient Instructions (Signed)
Health Maintenance, Female Adopting a healthy lifestyle and getting preventive care can go a long way to promote health and wellness. Talk with your health care provider about what schedule of regular examinations is right for you. This is a good chance for you to check in with your provider about disease prevention and staying healthy. In between checkups, there are plenty of things you can do on your own. Experts have done a lot of research about which lifestyle changes and preventive measures are most likely to keep you healthy. Ask your health care provider for more information. WEIGHT AND DIET  Eat a healthy diet  Be sure to include plenty of vegetables, fruits, low-fat dairy products, and lean protein.  Do not eat a lot of foods high in solid fats, added sugars, or salt.  Get regular exercise. This is one of the most important things you can do for your health.  Most adults should exercise for at least 150 minutes each week. The exercise should increase your heart rate and make you sweat (moderate-intensity exercise).  Most adults should also do strengthening exercises at least twice a week. This is in addition to the moderate-intensity exercise.  Maintain a healthy weight  Body mass index (BMI) is a measurement that can be used to identify possible weight problems. It estimates body fat based on height and weight. Your health care provider can help determine your BMI and help you achieve or maintain a healthy weight.  For females 20 years of age and older:   A BMI below 18.5 is considered underweight.  A BMI of 18.5 to 24.9 is normal.  A BMI of 25 to 29.9 is considered overweight.  A BMI of 30 and above is considered obese.  Watch levels of cholesterol and blood lipids  You should start having your blood tested for lipids and cholesterol at 57 years of age, then have this test every 5 years.  You may need to have your cholesterol levels checked more often if:  Your lipid  or cholesterol levels are high.  You are older than 57 years of age.  You are at high risk for heart disease.  CANCER SCREENING   Lung Cancer  Lung cancer screening is recommended for adults 55-80 years old who are at high risk for lung cancer because of a history of smoking.  A yearly low-dose CT scan of the lungs is recommended for people who:  Currently smoke.  Have quit within the past 15 years.  Have at least a 30-pack-year history of smoking. A pack year is smoking an average of one pack of cigarettes a day for 1 year.  Yearly screening should continue until it has been 15 years since you quit.  Yearly screening should stop if you develop a health problem that would prevent you from having lung cancer treatment.  Breast Cancer  Practice breast self-awareness. This means understanding how your breasts normally appear and feel.  It also means doing regular breast self-exams. Let your health care provider know about any changes, no matter how small.  If you are in your 20s or 30s, you should have a clinical breast exam (CBE) by a health care provider every 1-3 years as part of a regular health exam.  If you are 40 or older, have a CBE every year. Also consider having a breast X-ray (mammogram) every year.  If you have a family history of breast cancer, talk to your health care provider about genetic screening.  If you   are at high risk for breast cancer, talk to your health care provider about having an MRI and a mammogram every year.  Breast cancer gene (BRCA) assessment is recommended for women who have family members with BRCA-related cancers. BRCA-related cancers include:  Breast.  Ovarian.  Tubal.  Peritoneal cancers.  Results of the assessment will determine the need for genetic counseling and BRCA1 and BRCA2 testing. Cervical Cancer Your health care provider may recommend that you be screened regularly for cancer of the pelvic organs (ovaries, uterus, and  vagina). This screening involves a pelvic examination, including checking for microscopic changes to the surface of your cervix (Pap test). You may be encouraged to have this screening done every 3 years, beginning at age 21.  For women ages 30-65, health care providers may recommend pelvic exams and Pap testing every 3 years, or they may recommend the Pap and pelvic exam, combined with testing for human papilloma virus (HPV), every 5 years. Some types of HPV increase your risk of cervical cancer. Testing for HPV may also be done on women of any age with unclear Pap test results.  Other health care providers may not recommend any screening for nonpregnant women who are considered low risk for pelvic cancer and who do not have symptoms. Ask your health care provider if a screening pelvic exam is right for you.  If you have had past treatment for cervical cancer or a condition that could lead to cancer, you need Pap tests and screening for cancer for at least 20 years after your treatment. If Pap tests have been discontinued, your risk factors (such as having a new sexual partner) need to be reassessed to determine if screening should resume. Some women have medical problems that increase the chance of getting cervical cancer. In these cases, your health care provider may recommend more frequent screening and Pap tests. Colorectal Cancer  This type of cancer can be detected and often prevented.  Routine colorectal cancer screening usually begins at 57 years of age and continues through 57 years of age.  Your health care provider may recommend screening at an earlier age if you have risk factors for colon cancer.  Your health care provider may also recommend using home test kits to check for hidden blood in the stool.  A small camera at the end of a tube can be used to examine your colon directly (sigmoidoscopy or colonoscopy). This is done to check for the earliest forms of colorectal  cancer.  Routine screening usually begins at age 50.  Direct examination of the colon should be repeated every 5-10 years through 57 years of age. However, you may need to be screened more often if early forms of precancerous polyps or small growths are found. Skin Cancer  Check your skin from head to toe regularly.  Tell your health care provider about any new moles or changes in moles, especially if there is a change in a mole's shape or color.  Also tell your health care provider if you have a mole that is larger than the size of a pencil eraser.  Always use sunscreen. Apply sunscreen liberally and repeatedly throughout the day.  Protect yourself by wearing long sleeves, pants, a wide-brimmed hat, and sunglasses whenever you are outside. HEART DISEASE, DIABETES, AND HIGH BLOOD PRESSURE   High blood pressure causes heart disease and increases the risk of stroke. High blood pressure is more likely to develop in:  People who have blood pressure in the high end   of the normal range (130-139/85-89 mm Hg).  People who are overweight or obese.  People who are African American.  If you are 38-23 years of age, have your blood pressure checked every 3-5 years. If you are 61 years of age or older, have your blood pressure checked every year. You should have your blood pressure measured twice--once when you are at a hospital or clinic, and once when you are not at a hospital or clinic. Record the average of the two measurements. To check your blood pressure when you are not at a hospital or clinic, you can use:  An automated blood pressure machine at a pharmacy.  A home blood pressure monitor.  If you are between 45 years and 39 years old, ask your health care provider if you should take aspirin to prevent strokes.  Have regular diabetes screenings. This involves taking a blood sample to check your fasting blood sugar level.  If you are at a normal weight and have a low risk for diabetes,  have this test once every three years after 57 years of age.  If you are overweight and have a high risk for diabetes, consider being tested at a younger age or more often. PREVENTING INFECTION  Hepatitis B  If you have a higher risk for hepatitis B, you should be screened for this virus. You are considered at high risk for hepatitis B if:  You were born in a country where hepatitis B is common. Ask your health care provider which countries are considered high risk.  Your parents were born in a high-risk country, and you have not been immunized against hepatitis B (hepatitis B vaccine).  You have HIV or AIDS.  You use needles to inject street drugs.  You live with someone who has hepatitis B.  You have had sex with someone who has hepatitis B.  You get hemodialysis treatment.  You take certain medicines for conditions, including cancer, organ transplantation, and autoimmune conditions. Hepatitis C  Blood testing is recommended for:  Everyone born from [birth year] through 1967.  Anyone with known risk factors for hepatitis C. Sexually transmitted infections (STIs)  You should be screened for sexually transmitted infections (STIs) including gonorrhea and chlamydia if:  You are sexually active and are younger than 57 years of age.  You are older than 57 years of age and your health care provider tells you that you are at risk for this type of infection.  Your sexual activity has changed since you were last screened and you are at an increased risk for chlamydia or gonorrhea. Ask your health care provider if you are at risk.  If you do not have HIV, but are at risk, it may be recommended that you take a prescription medicine daily to prevent HIV infection. This is called pre-exposure prophylaxis (PrEP). You are considered at risk if:  You are sexually active and do not regularly use condoms or know the HIV status of your partner(s).  You take drugs by injection.  You are sexually  active with a partner who has HIV. Talk with your health care provider about whether you are at high risk of being infected with HIV. If you choose to begin PrEP, you should first be tested for HIV. You should then be tested every 3 months for as long as you are taking PrEP.  PREGNANCY   If you are premenopausal and you may become pregnant, ask your health care provider about preconception counseling.  If you may  become pregnant, take 400 to 800 micrograms (mcg) of folic acid every day.  If you want to prevent pregnancy, talk to your health care provider about birth control (contraception). OSTEOPOROSIS AND MENOPAUSE   Osteoporosis is a disease in which the bones lose minerals and strength with aging. This can result in serious bone fractures. Your risk for osteoporosis can be identified using a bone density scan.  If you are 61 years of age or older, or if you are at risk for osteoporosis and fractures, ask your health care provider if you should be screened.  Ask your health care provider whether you should take a calcium or vitamin D supplement to lower your risk for osteoporosis.  Menopause may have certain physical symptoms and risks.  Hormone replacement therapy may reduce some of these symptoms and risks. Talk to your health care provider about whether hormone replacement therapy is right for you.  HOME CARE INSTRUCTIONS   Schedule regular health, dental, and eye exams.  Stay current with your immunizations.   Do not use any tobacco products including cigarettes, chewing tobacco, or electronic cigarettes.  If you are pregnant, do not drink alcohol.  If you are breastfeeding, limit how much and how often you drink alcohol.  Limit alcohol intake to no more than 1 drink per day for nonpregnant women. One drink equals 12 ounces of beer, 5 ounces of wine, or 1 ounces of hard liquor.  Do not use street drugs.  Do not share needles.  Ask your health care provider for help if  you need support or information about quitting drugs.  Tell your health care provider if you often feel depressed.  Tell your health care provider if you have ever been abused or do not feel safe at home.   This information is not intended to replace advice given to you by your health care provider. Make sure you discuss any questions you have with your health care provider.   Document Released: 04/21/2011 Document Revised: 10/27/2014 Document Reviewed: 09/07/2013 Elsevier Interactive Patient Education [employer].

## 2016-05-23 NOTE — Progress Notes (Signed)
Subjective:    Patient ID: Casey Sanders, female    DOB: [birth year], 55 y.o.   MRN: [id number]  Pt presents to the office today for chronic follow up. PT is followed by Pain Management every 3 months for chronic pain syndrome, fibromyalgia, and diabetic neuropathy. PT states her pain is constant and a 11 out 10. Pt is followed by Cardiologists annually because of family history of cardiac events and palpations. Pt is also complaining of   rash under abdomen that itches and started about a month ago.  Medication Refill  Associated symptoms include a visual change. Pertinent negatives include no headaches or sore throat.  Diabetes  She presents for her follow-up diabetic visit. She has type 2 diabetes mellitus. Hypoglycemia symptoms include nervousness/anxiousness. Pertinent negatives for hypoglycemia include no headaches. Associated symptoms include foot paresthesias and visual change. Pertinent negatives for diabetes include no blurred vision and no foot ulcerations. There are no hypoglycemic complications. Symptoms are worsening. Diabetic complications include peripheral neuropathy. Pertinent negatives for diabetic complications include no CVA, heart disease or nephropathy. Risk factors for coronary artery disease include dyslipidemia, diabetes mellitus, obesity, hypertension, family history, sedentary lifestyle and post-menopausal. Current diabetic treatment includes oral agent (dual therapy). She is compliant with treatment all of the time. She is following a generally unhealthy diet. Her breakfast blood glucose range is generally 140-180 mg/dl. An ACE inhibitor/angiotensin II receptor blocker is being taken. Eye exam is current (Jan 2017).  Hypertension  This is a chronic problem. The current episode started more than 1 year ago. The problem has been resolved since onset. The problem is controlled. Associated symptoms include anxiety and shortness of breath (at times). Pertinent negatives include no  blurred vision, headaches, palpitations or peripheral edema. Risk factors for coronary artery disease include diabetes mellitus, dyslipidemia, family history, obesity, sedentary lifestyle and post-menopausal state. Past treatments include ACE inhibitors. The current treatment provides moderate improvement. There is no history of kidney disease, CAD/MI or CVA.  Hyperlipidemia  This is a chronic problem. The current episode started more than 1 year ago. The problem is controlled. Recent lipid tests were reviewed and are normal. Exacerbating diseases include diabetes and obesity. Associated symptoms include shortness of breath (at times). Current antihyperlipidemic treatment includes statins and fibric acid derivatives. The current treatment provides significant improvement of lipids. Risk factors for coronary artery disease include diabetes mellitus, dyslipidemia, family history, obesity, hypertension, a sedentary lifestyle and post-menopausal.  Anxiety  Presents for follow-up visit. Symptoms include depressed mood, excessive worry, nervous/anxious behavior, panic, restlessness and shortness of breath (at times). Patient reports no irritability, palpitations or suicidal ideas. Symptoms occur most days. The severity of symptoms is moderate. The quality of sleep is fair.    Depression         This is a chronic problem.  The current episode started more than 1 year ago.   The onset quality is gradual.   The problem has been waxing and waning since onset.  Associated symptoms include irritable, restlessness and sad.  Associated symptoms include no helplessness, no hopelessness, no headaches and no suicidal ideas.     The symptoms are aggravated by family issues.  Past treatments include SSRIs - Selective serotonin reuptake inhibitors.  Past medical history includes anxiety.   Gastroesophageal Reflux  She reports no belching, no heartburn or no sore throat. This is a chronic problem. The current episode started  more than 1 year ago. The problem occurs rarely. The problem has been resolved. The symptoms are  aggravated by lying down. She has tried a PPI for the symptoms. The treatment provided moderate relief.      Review of Systems  Constitutional: Negative.  Negative for irritability.  HENT: Negative.  Negative for sore throat.   Eyes: Negative.  Negative for blurred vision.  Respiratory: Positive for shortness of breath (at times).   Cardiovascular: Negative.  Negative for palpitations.  Gastrointestinal: Negative.  Negative for heartburn.  Endocrine: Negative.   Genitourinary: Negative.   Musculoskeletal: Negative.   Neurological: Negative.  Negative for headaches.  Hematological: Negative.   Psychiatric/Behavioral: Positive for depression. Negative for suicidal ideas. The patient is nervous/anxious.   All other systems reviewed and are negative.      Objective:   Physical Exam  Constitutional: She is oriented to person, place, and time. She appears well-developed and well-nourished. She is irritable. No distress.  HENT:  Head: Normocephalic and atraumatic.  Right Ear: External ear normal.  Left Ear: External ear normal.  Nose: Nose normal.  Mouth/Throat: Oropharynx is clear and moist.  Eyes: Pupils are equal, round, and reactive to light.  Neck: Normal range of motion. Neck supple. No thyromegaly present.  Cardiovascular: Normal rate, regular rhythm, normal heart sounds and intact distal pulses.   No murmur heard. Pulmonary/Chest: Effort normal and breath sounds normal. No respiratory distress. She has no wheezes.  Abdominal: Soft. Bowel sounds are normal. She exhibits no distension. There is no tenderness.  Musculoskeletal: Normal range of motion. She exhibits no edema or tenderness.  Neurological: She is alert and oriented to person, place, and time. She has normal reflexes. No cranial nerve deficit.  Skin: Skin is warm and dry. Rash noted. There is erythema.  Erythemas yeast  rash under abdomen fold   Psychiatric: She has a normal mood and affect. Her behavior is normal. Judgment and thought content normal.  Vitals reviewed.     BP 119/80   Pulse 96   Temp 98.2 F (36.8 C) (Oral)   Ht '5\' 1"'  (1.549 m)   Wt 219 lb 12.8 oz (99.7 kg)   BMI 41.53 kg/m      Assessment & Plan:  1. Benign essential HTN - CMP14+EGFR  2. Type 2 diabetes mellitus with other neurologic complication, without long-term current use of insulin (HCC) - Bayer DCA Hb A1c Waived - CMP14+EGFR - Dulaglutide (TRULICITY) 1.5 OQ/9.4TM SOPN; Inject 1.5 mg into the skin once a week.  Dispense: 12 pen; Refill: 3  3. Type 2 diabetes mellitus with diabetic neuropathy, with long-term current use of insulin (HCC) - CMP14+EGFR - Microalbumin / creatinine urine ratio  4. Chronic pain - CMP14+EGFR  5. Hyperlipidemia - CMP14+EGFR - Lipid panel  6. GAD (generalized anxiety disorder) - CMP14+EGFR  7. Gastroesophageal reflux disease, esophagitis presence not specified - CMP14+EGFR  8. Depression - CMP14+EGFR  9. Fibromyalgia syndrome - CMP14+EGFR  10. Family history of cardiac disorder - CMP14+EGFR  11. Need for hepatitis C screening test - CMP14+EGFR - Hepatitis C antibody  12. Encounter for screening for HIV - CMP14+EGFR - HIV antibody  13. Colon cancer screening - CMP14+EGFR - Fecal occult blood, imunochemical; Future  14. Yeast infection of the skin - nystatin (MYCOSTATIN/NYSTOP) powder; Apply topically 4 (four) times daily.  Dispense: 15 g; Refill: 0   Continue all meds Labs pending Health Maintenance reviewed Diet and exercise encouraged RTO 3 months  Evelina Dun, FNP

## 2016-05-24 LAB — CMP14+EGFR
A/G RATIO: 1.3 (ref 1.2–2.2)
ALK PHOS: 61 IU/L (ref 39–117)
ALT: 29 IU/L (ref 0–32)
AST: 29 IU/L (ref 0–40)
Albumin: 4.3 g/dL (ref 3.5–5.5)
BILIRUBIN TOTAL: 0.6 mg/dL (ref 0.0–1.2)
BUN / CREAT RATIO: 19 (ref 9–23)
BUN: 14 mg/dL (ref 6–24)
CHLORIDE: 97 mmol/L (ref 96–106)
CO2: 26 mmol/L (ref 18–29)
Calcium: 9.9 mg/dL (ref 8.7–10.2)
Creatinine, Ser: 0.75 mg/dL (ref 0.57–1.00)
GFR calc non Af Amer: 89 mL/min/{1.73_m2} (ref >59)
GFR, EST AFRICAN AMERICAN: 102 mL/min/{1.73_m2} (ref >59)
Globulin, Total: 3.2 g/dL (ref 1.5–4.5)
Glucose: 205 mg/dL — ABNORMAL HIGH (ref 65–99)
POTASSIUM: 4.1 mmol/L (ref 3.5–5.2)
Sodium: 140 mmol/L (ref 134–144)
TOTAL PROTEIN: 7.5 g/dL (ref 6.0–8.5)

## 2016-05-24 LAB — LIPID PANEL
CHOLESTEROL TOTAL: 134 mg/dL (ref 100–199)
Chol/HDL Ratio: 2.9 ratio units (ref 0.0–4.4)
HDL: 47 mg/dL (ref >39)
LDL Calculated: 60 mg/dL (ref 0–99)
Triglycerides: 135 mg/dL (ref 0–149)
VLDL Cholesterol Cal: 27 mg/dL (ref 5–40)

## 2016-05-24 LAB — HIV ANTIBODY (ROUTINE TESTING W REFLEX): HIV Screen 4th Generation wRfx: NONREACTIVE

## 2016-05-24 LAB — MICROALBUMIN / CREATININE URINE RATIO
Creatinine, Urine: 54.5 mg/dL
MICROALB/CREAT RATIO: 20.7 mg/g{creat} (ref 0.0–30.0)
MICROALBUM., U, RANDOM: 11.3 ug/mL

## 2016-05-24 LAB — HEPATITIS C ANTIBODY: HEP C VIRUS AB: 0.2 {s_co_ratio} (ref 0.0–0.9)

## 2016-05-26 ENCOUNTER — Other Ambulatory Visit: Payer: Self-pay | Admitting: Family Medicine

## 2016-05-26 ENCOUNTER — Telehealth: Payer: Self-pay | Admitting: Family Medicine

## 2016-05-26 ENCOUNTER — Other Ambulatory Visit: Payer: Self-pay | Admitting: Family

## 2016-05-26 MED ORDER — CANAGLIFLOZIN 100 MG PO TABS: 100.0000 mg | ORAL_TABLET | Freq: Every day | ORAL | 1 refills | Status: DC

## 2016-05-26 NOTE — Telephone Encounter (Signed)
Pt aware for sugar and labs were reviewed

## 2016-05-28 ENCOUNTER — Other Ambulatory Visit: Payer: Self-pay

## 2016-05-28 MED ORDER — LISINOPRIL 10 MG PO TABS: 10.0000 mg | ORAL_TABLET | Freq: Every day | ORAL | 2 refills | Status: DC

## 2016-05-29 ENCOUNTER — Other Ambulatory Visit: Payer: Self-pay | Admitting: Family Medicine

## 2016-06-02 ENCOUNTER — Other Ambulatory Visit: Payer: 59

## 2016-06-02 DIAGNOSIS — Z1211 Encounter for screening for malignant neoplasm of colon: Secondary | ICD-10-CM

## 2016-06-04 LAB — FECAL OCCULT BLOOD, IMMUNOCHEMICAL: FECAL OCCULT BLD: NEGATIVE

## 2016-06-11 ENCOUNTER — Encounter: Payer: 59 | Admitting: *Deleted

## 2016-06-16 ENCOUNTER — Telehealth: Payer: Self-pay | Admitting: Family Medicine

## 2016-06-17 MED ORDER — EMPAGLIFLOZIN 10 MG PO TABS: 10.0000 mg | ORAL_TABLET | Freq: Every day | ORAL | 0 refills | Status: DC

## 2016-06-17 NOTE — Telephone Encounter (Signed)
Patient states that she was not told about prescription for Inovkana.  She would have never agreed to this prescription because of the TV commercials of side effect.  I discuss alternative of Jardiance 10mg  - patient is agreeable to this.  Gave #28 samples of Jardiance 10mg  take 1 tablet daily.  RCT in 4 weeks to assess BG

## 2016-07-15 ENCOUNTER — Telehealth: Payer: Self-pay | Admitting: Pharmacist

## 2016-07-15 ENCOUNTER — Ambulatory Visit (INDEPENDENT_AMBULATORY_CARE_PROVIDER_SITE_OTHER): Payer: 59 | Admitting: Pharmacist

## 2016-07-15 ENCOUNTER — Encounter: Payer: Self-pay | Admitting: Pharmacist

## 2016-07-15 VITALS — BP 122/78 | HR 84 | Ht 61.0 in | Wt 221.0 lb

## 2016-07-15 DIAGNOSIS — Z23 Encounter for immunization: Secondary | ICD-10-CM

## 2016-07-15 DIAGNOSIS — E1139 Type 2 diabetes mellitus with other diabetic ophthalmic complication: Secondary | ICD-10-CM | POA: Diagnosis not present

## 2016-07-15 DIAGNOSIS — R7989 Other specified abnormal findings of blood chemistry: Secondary | ICD-10-CM

## 2016-07-15 MED ORDER — GLIMEPIRIDE 4 MG PO TABS: 4.0000 mg | ORAL_TABLET | Freq: Two times a day (BID) | ORAL | 2 refills | Status: DC

## 2016-07-15 NOTE — Telephone Encounter (Signed)
[facility] did not have [facility] for TID dosing - they think it might have been at [facility].  I called patient and she has been getting glimepiride at [facility].  She [facility]anyl [facility] because [facility] ones at [facility] cause a rash.  Rx resent to [facility] with corrected sig of bid glimepiride 4mg  bid.

## 2016-07-15 NOTE — Progress Notes (Signed)
Diabetes Follow-Up Visit Chief Complaint:   Chief Complaint  Patient presents with  . Diabetes     Vitals:   07/15/16 0954  BP: 122/78  Pulse: 84    Filed Weights   07/15/16 0954  Weight: 221 lb (100.2 kg)   Body mass index is 41.76 kg/m.   HPI: Patient with type 2 DM not at goal.  I last saw Casey Sanders about 1.5 years ago.  At that time A1c was 8.0%.  She has continued to have difficulty controlling BG. Last month she saw Casey Sanders and BG was elevated and Invokana added however patient was concerned about TV reports she saw about Invokana and did not want to take.  She was instead started on Jardiance 70m qd.  BG has ranged from 87-150 since addition of Jardiance.  She has been compliant with mediations.   Current Diabetes Medications:  glimepiride (Amaryl) 417mbid; Jardiance 1078LFd and  Trulicity 1.8.1OFQ weekly  Home BG Monitoring:  Checking 1 to 2 times a day. Average: 130's  Low fat/carbohydrate diet?  Most of the time but has been eating more sweets lately related to stress eating.  She has had several family members die recently.  Nicotine Abuse?  No Medication Compliance?  No Exercise?  No Alcohol Abuse?  No  Polyuria: negative Polydipsia:  negative Polyphagia:  negative  BMI:  Body mass index is 41.76 kg/m.    Weight changes:  stable   Lab Results  Component Value Date   HBGA1C 8.7 05/23/2016   HGBA1C 8.4 12/10/2015    No results found for: [facility]  Component Value Date   CHOL 134 05/23/2016   HDL 47 05/23/2016   LDLCALC 60 05/23/2016   TRIG 135 05/23/2016   CHOLHDL 2.9 05/23/2016      Assessment: 1.  Diabetes. Uncontrolled  2.  Blood Pressure.  Good today 3.  Lipids.  At goal when checked 05/2016 4.  Obesity - weight currently stable 5.  History of vitamin D deficiency - last checked 10/2015, patient is not taking supplement any more. 6.  Health Maintaince - due flu and pneumo vaccines  Recommendations: 1.  Medication  recommendations at this time are as follows:   Continue glimepiride 109m83mid ([facility] Rx sign was [facility])  Continue Jardiance 26m235mtablet daily  Continue Trulicity 1.35m7.5ZWq week - gave #4 samples  2.  Reviewed HBG goals:  Fasting 80-130 and 1-2 hour post prandial <180.  Patient is instructed to check BG 1  To 2 times per day.    3.  BP goal < 140/85. 4.  LDL goal of < 100, HDL > 40 and TG < 150. 5.  Influenza vaccine and Pneumovax 23 given in office today 6.  Dietary recommendations:  Limit portion sizes and CHO intake.  Discussed emotional eating and handout given 7.  Physical Activity recommendations:  Increase as able - try walking 5 minues twice a day 8.    Orders Placed This Encounter  Procedures  . Flu Vaccine QUAD 36+ mos IM  . Pneumococcal polysaccharide vaccine 23-valent greater than or equal to 2yo subcutaneous/IM  . BMP8+EGFR  . VITAMIN D 25 Hydroxy (Vit-D Deficiency, Fractures)     Time spent counseling patient:  30 minutes   Casey Sanders, CPP, CDE         Patient ID: Casey Sanders   DOB: 5/22Jul 21, 1962 y26.   MRN: [id number]

## 2016-07-15 NOTE — Patient Instructions (Signed)
It's the reason why so many diets fail: We don't always eat just to satisfy hunger. Many of [place] also turn to food to relieve stress or cope with unpleasant emotions such as sadness, loneliness, or boredom. And after eating, we feel even worse. Not only does the original emotional issue remain, but we also feel guilty for overeating. No matter how powerless you feel over food cravings, though, there is an answer. By practicing mindful eating, you can change the emotional habits that have sabotaged your diet in the past, and regain control over both food and your feelings. What is emotional eating? Emotional eating (or stress eating) is using food to make yourself feel better-eating to satisfy emotional needs, rather than to satisfy physical hunger. You might reach for a pint of ice cream when you're feeling down, order a pizza if you're bored or lonely, or swing by the drive-through after a stressful day at work. Occasionally using food as a pick me up, a reward, or to celebrate isn't necessarily a bad thing. But when eating is your primary emotional coping mechanism-when your first impulse is to open the refrigerator whenever you're stressed, upset, angry, lonely, exhausted, or bored-you get stuck in an unhealthy cycle where the real feeling or problem is never addressed. Emotional hunger can't be filled with food. Eating may feel good in the moment, but the feelings that triggered the eating are still there. And you often feel worse than you did before because of the unnecessary calories you've just consumed. No matter how powerless you feel over food and your feelings, it is possible to make a positive change. You can find healthier ways to deal with your emotions, learn to eat mindfully instead of mindlessly, regain control of your weight, and finally put a stop to emotional eating.  Are you an emotional eater? Do you eat more when you're feeling stressed?  Do you eat when you're not hungry or when you're  full?  Do you eat to feel better (to calm and soothe yourself when you're sad, mad, bored, anxious, etc.)?  Do you reward yourself with food?  Do you regularly eat until you've stuffed yourself?  Does food make you feel safe? Do you feel like food is a friend?  Do you feel powerless or out of control around food? The difference between emotional hunger and physical hunger Emotional hunger can be powerful, so it's easy to mistake it for physical hunger. But there are clues you can look for to help you tell physical and emotional hunger apart.  Emotional hunger comes on suddenly. It hits you in an instant and feels overwhelming and urgent. Physical hunger, on the other hand, comes on more gradually. The urge to eat doesn't feel as dire or demand instant satisfaction (unless you haven't eaten for a very long time). Emotional hunger craves specific comfort foods. When you're physically hungry, almost anything sounds good-including healthy stuff like vegetables. But emotional hunger craves junk food or sugary snacks that provide an instant rush. You feel like you need cheesecake or pizza, and nothing else will do. Emotional hunger often leads to mindless eating. Before you know it, you've eaten a whole bag of chips or an entire pint of ice cream without really paying attention or fully enjoying it. When you're eating in response to physical hunger, you're typically more aware of what you're doing. Emotional hunger isn't satisfied once you're full. You keep wanting more and more, often eating until you're uncomfortably stuffed. Physical hunger, on the  other hand, doesn't need to be stuffed. You feel satisfied when your stomach is full. Emotional hunger isn't located in the stomach. Rather than a growling belly or a pang in your stomach, you feel your hunger as a craving you can't get out of your head. You're focused on specific textures, tastes, and smells. Emotional hunger often leads to regret, guilt, or  shame. When you eat to satisfy physical hunger, you're unlikely to feel guilty or ashamed because you're simply giving your body what it needs. If you feel guilty after you eat, it's likely because you know deep down that you're not eating for nutritional reasons. Identify your emotional eating triggers What situations, places, or feelings make you reach for the comfort of food? Most emotional eating is linked to unpleasant feelings, but it can also be triggered by positive emotions, such as rewarding yourself for achieving a goal or celebrating a holiday or happy event. Common causes of emotional eating include:  Stress - Ever notice how stress makes you hungry? It's not just in your mind. When stress is chronic, your body produces as it so often is in our chaotic, fast-paced world, it leads to high levels of the stress hormone, cortisol. Cortisol triggers cravings for salty, sweet, and fried foods-foods that give you a burst of energy and pleasure. The more uncontrolled stress in your life, the more likely you are to turn to food for emotional relief. Stuffing emotions - Eating can be a way to temporarily silence or "stuff down" uncomfortable emotions, including anger, fear, sadness, anxiety, loneliness, resentment, and shame. While you're numbing yourself with food, you can avoid the difficult emotions you'd rather not feel. Boredom or feelings of emptiness - Do you ever eat simply to give yourself something to do, to relieve boredom, or as a way to fill a void in your life? You feel unfulfilled and empty, and food is a way to occupy your mouth and your time. In the moment, it fills you up and distracts you from underlying feelings of purposelessness and dissatisfaction with your life. Childhood habits - Think back to your childhood memories of food. Did your parents reward good behavior with ice cream, take you out for pizza when you got a good report card, or serve you sweets when you were feeling sad?  These habits can often carry over into adulthood. Or your eating may be driven by nostalgia-for cherished memories of grilling burgers in the backyard with your dad or baking and eating cookies with your mom. Social influences - Getting together with other people for a meal is a great way to relieve stress, but it can also lead to overeating. It's easy to overindulge simply because the food is there or because everyone else is eating. You may also overeat in social situations out of nervousness. Or perhaps your family or circle of friends encourages you to overeat, and it's easier to go along with the group. Find other ways to feed your feelings If you don't know how to manage your emotions in a way that doesn't involve food, you won't be able to control your eating habits for very long. Diets so often fail because they offer logical nutritional advice which only works if you have conscious control over your eating habits. It doesn't work when emotions hijack the process, demanding an immediate payoff with food. In order to stop emotional eating, you have to find other ways to fulfill yourself emotionally. In order to stop emotional eating, you have to find  other ways to fulfill yourself emotionally. It's not enough to understand the cycle of emotional eating or even to understand your triggers, although that's a huge first step. You need alternatives to food that you can turn to for emotional fulfillment.  Alternatives to emotional eating If you're depressed or lonely, call someone who always makes you feel better, play with your dog or cat, or look at a favorite photo or cherished memento. If you're anxious, expend your nervous energy by dancing to your favorite song, squeezing a stress ball, or taking a brisk walk. If you're exhausted, treat yourself with a hot cup of tea, take a bath, light some scented candles, or wrap yourself in a warm blanket. If you're bored, read a good book, watch a comedy show,  explore the outdoors, or turn to an activity you enjoy (woodworking, playing the guitar, shooting hoops, scrapbooking, etc.).  What is mindful eating  Mindful eating is a practice that develops your awareness of eating habits and allows you to pause between your triggers and your actions. Most emotional eaters feel powerless over their food cravings. When the urge to eat hits, you feel an almost unbearable tension that demands to be fed, right now. Because you've tried to resist in the past and failed, you believe that your willpower just isn't up to snuff. But the truth is that you have more power over your cravings than you think.  Take 5 before you give in to a craving Emotional eating tends to be automatic and virtually mindless. Before you even realize what you're doing, you've reached for a tub of ice cream and polished off half of it. But if you can take a moment to pause and reflect when you're hit with a craving, you give yourself the opportunity to make a different decision. Can you put off eating for five minutes? Or just start with one minute. Don't tell yourself you can't give in to the craving; remember, the forbidden is extremely tempting. Just tell yourself to wait. While you're waiting, check in with yourself. How are you feeling? What's going on emotionally? Even if you end up eating, you'll have a better understanding of why you did it. This can help you set yourself up for a different response next time.  Learn to accept your feelings-even the bad ones While it may seem that the core problem is that you're powerless over food, emotional eating actually stems from feeling powerless over your emotions. You don't feel capable of dealing with your feelings head on, so you avoid them with food.  Allowing yourself to feel uncomfortable emotions can be scary. You may fear that, like Pandora's box, once you open the door you won't be able to shut it. But the truth is that when we don't obsess  over or suppress our emotions, even the most painful and difficult feelings subside relatively quickly and lose their power to control our attention. To do this you need to become mindful and learn how to stay connected to your moment-to-moment emotional experience. This can enable you to rein in stress and repair emotional problems that often trigger emotional eating.   8 steps to mindful eating Like most of [place], you've probably eaten something in the past few hours. And, like many of [place], you may not be able to recall everything you ate, let alone the sensation of eating it. Because we're working, driving, reading, watching television, or fiddling with an electronic device, we're not fully aware of what we're eating.  By truly paying attention to the food you eat, you may indulge in foods like a cheeseburger and fries less often. In essence, mindful eating means being fully attentive to your food-as you buy, prepare, serve, and consume it. In the book Savor: Mindful Eating, Mindful Life, Dr. Marciano Sequin and her co-author, Buddhist spiritual leader [employer] [place] [place], suggest several practices that can help you get there, including those listed below. 1. Begin with your shopping list. Consider the health value of every item you add to your list and stick to it to avoid impulse buying when you're shopping. Fill most of your cart in the produce section and avoid the center aisles-which are heavy with processed foods-and the chips and candy at the check-out counter.  2. Come to the table with an appetite-but not when ravenously hungry. If you skip meals, you may be so eager to get anything in your stomach that your first priority is filling the void instead of enjoying your food.  3. Start with a small portion. It may be helpful to limit the size of your plate to nine inches or less.  4. Appreciate your food. Pause for a minute or two before you begin eating to contemplate everything and everyone it took to  bring the meal to your table. Silently express your gratitude for the opportunity to enjoy delicious food and the companions you're enjoying it with.  5. Bring all your senses to the meal. When you're cooking, serving, and eating your food, be attentive to color, texture, aroma, and even the sounds different foods make as you prepare them. As you chew your food, try identifying all the ingredients, especially seasonings.  6. Take small bites. It's easier to taste food completely when your mouth isn't full. Put down your utensil between bites.  7. Chew thoroughly. Chew well until you can taste the essence of the food. (You may have to chew each mouthful 20 to 40 times, depending on the food.) You may be surprised at all the flavors that are released.  8. Eat slowly. If you follow the advice above, you won't bolt your food down. Devote at least five minutes to mindful eating before you chat with your tablemates.

## 2016-07-16 LAB — SPECIMEN STATUS

## 2016-07-17 LAB — BMP8+EGFR
BUN/Creatinine Ratio: 20 (ref 9–23)
BUN: 18 mg/dL (ref 6–24)
CALCIUM: 9.8 mg/dL (ref 8.7–10.2)
CO2: 24 mmol/L (ref 18–29)
CREATININE: 0.89 mg/dL (ref 0.57–1.00)
Chloride: 101 mmol/L (ref 96–106)
GFR calc Af Amer: 83 mL/min/{1.73_m2} (ref >59)
GFR, EST NON AFRICAN AMERICAN: 72 mL/min/{1.73_m2} (ref >59)
Glucose: 130 mg/dL — ABNORMAL HIGH (ref 65–99)
POTASSIUM: 4.3 mmol/L (ref 3.5–5.2)
Sodium: 141 mmol/L (ref 134–144)

## 2016-07-17 LAB — VITAMIN D 25 HYDROXY (VIT D DEFICIENCY, FRACTURES): VIT D 25 HYDROXY: 12.2 ng/mL — AB (ref 30.0–100.0)

## 2016-07-22 ENCOUNTER — Other Ambulatory Visit: Payer: Self-pay | Admitting: Pharmacist

## 2016-07-22 MED ORDER — VITAMIN D (ERGOCALCIFEROL) 1.25 MG (50000 UNIT) PO CAPS: 50000.0000 [IU] | ORAL_CAPSULE | ORAL | 0 refills | Status: DC

## 2016-08-04 ENCOUNTER — Telehealth: Payer: Self-pay | Admitting: Family Medicine

## 2016-08-05 MED ORDER — EMPAGLIFLOZIN 10 MG PO TABS: 10.0000 mg | ORAL_TABLET | Freq: Every day | ORAL | 1 refills | Status: DC

## 2016-08-05 NOTE — Telephone Encounter (Signed)
No Jardiance samples currently available.  I can send in Rx and leave copay card for patient. Called patient and she is agreeable to this.

## 2016-09-02 ENCOUNTER — Ambulatory Visit (INDEPENDENT_AMBULATORY_CARE_PROVIDER_SITE_OTHER): Payer: 59 | Admitting: Family Medicine

## 2016-09-02 ENCOUNTER — Encounter: Payer: Self-pay | Admitting: Family Medicine

## 2016-09-02 VITALS — BP 121/75 | HR 89 | Temp 97.2°F | Ht 61.0 in | Wt 223.0 lb

## 2016-09-02 DIAGNOSIS — I10 Essential (primary) hypertension: Secondary | ICD-10-CM

## 2016-09-02 DIAGNOSIS — M25512 Pain in left shoulder: Secondary | ICD-10-CM | POA: Diagnosis not present

## 2016-09-02 DIAGNOSIS — E559 Vitamin D deficiency, unspecified: Secondary | ICD-10-CM

## 2016-09-02 DIAGNOSIS — M797 Fibromyalgia: Secondary | ICD-10-CM

## 2016-09-02 DIAGNOSIS — G2581 Restless legs syndrome: Secondary | ICD-10-CM | POA: Diagnosis not present

## 2016-09-02 DIAGNOSIS — E1149 Type 2 diabetes mellitus with other diabetic neurological complication: Secondary | ICD-10-CM | POA: Diagnosis not present

## 2016-09-02 LAB — BAYER DCA HB A1C WAIVED: HB A1C (BAYER DCA - WAIVED): 7.3 % — ABNORMAL HIGH (ref <7.0)

## 2016-09-02 NOTE — Progress Notes (Signed)
Subjective:    Patient ID: Casey Sanders, female    DOB: 10-30-1960, 55 y.o.   MRN: [id number]  HPI 55 year old female with fibromyalgia diabetes with neuropathy anxiety disorder. Patient does complain of pain in her left shoulder. She previously had rotator cuff surgery in 2009 on the shoulder. She was given Jardiance samples for her diabetes. Even though is giving her yeast infection her sugars have been better. Her A1c today was 7.3 which is down from 8.73 months ago. Her chronic pain is managed by a pain clinic. She is given fentanyl and gabapentin  Patient Active Problem List   Diagnosis Date Noted  . GAD (generalized anxiety disorder) 05/23/2016  . GERD (gastroesophageal reflux disease) 05/23/2016  . Depression 05/23/2016  . Fibromyalgia syndrome 05/23/2016  . Family history of cardiac disorder 05/23/2016  . Cystitis 02/08/2015  . Rectal bleeding 08/22/2013  . Diabetic neuropathy, type II diabetes mellitus ([place]) 03/17/2013  . Chest pain, unspecified 02/12/2011  . Syncope 02/12/2011  . Pre-operative cardiovascular examination 02/12/2011  . Diabetes mellitus ([place]) 01/04/2011  . Chronic pain 01/04/2011  . Diverticul disease small and large intestine, no perforati or abscess 01/04/2011  . Benign essential HTN 01/04/2011  . Hyperlipidemia 01/04/2011  . Restless leg syndrome 01/04/2011  . Cervical adenocarcinoma ([place]) 01/04/2011  . Palpitations 01/04/2011   Outpatient Encounter Prescriptions as of 09/02/2016  Medication Sig  . amitriptyline (ELAVIL) 25 MG tablet Take one or two tablets at bedtime.  Marland Kitchen aspirin 81 MG EC tablet Take 81 mg by mouth daily.    Marland Kitchen atorvastatin (LIPITOR) 80 MG tablet Take 1 tablet (80 mg total) by mouth daily at 6 PM. (Patient taking differently: Take 40 mg by mouth daily at 6 PM. )  . clonazePAM (KLONOPIN) 1 MG tablet   . cyclobenzaprine (FLEXERIL) 10 MG tablet Take 1 tablet by mouth 3 (three) times daily as needed.  . diclofenac sodium (VOLTAREN) 1 %  GEL Apply 2 g topically 4 (four) times daily.  . Dulaglutide (TRULICITY) 1.5 0000000 SOPN Inject 1.5 mg into the skin once a week.  . empagliflozin (JARDIANCE) 10 MG TABS tablet Take 10 mg by mouth daily.  . famotidine (PEPCID) 20 MG tablet TAKE ONE TABLET BY MOUTH TWICE DAILY  . fentaNYL (DURAGESIC - DOSED MCG/HR) 25 MCG/HR patch PLACE 1 PATCH ONTO THE SKIN EVERY THIRD DAY, REMOVE OLD PATCH BEFORE PLACING NEW PATCH  . fexofenadine (ALLEGRA) 60 MG tablet Take 60 mg by mouth 2 (two) times daily.    Marland Kitchen FINGERSTIX LANCETS MISC Use to check BG up to bid.  Dx:  250.00  . fish oil-omega-3 fatty acids 1000 MG capsule Take 2 g by mouth daily.    . fluticasone (FLONASE) 50 MCG/ACT nasal spray 2 sprays by Nasal route daily. 1 spray each nostril qd   . gabapentin (NEURONTIN) 800 MG tablet Take 800 mg by mouth 4 (four) times daily.   Marland Kitchen glimepiride (AMARYL) 4 MG tablet Take 1 tablet (4 mg total) by mouth 2 (two) times daily. As directed  . glucose blood (TRUETRACK TEST) test strip Use to check BG up to bid.  Dx:  250.00  . lidocaine (LIDODERM) 5 % Place 3 patches onto the skin daily. Apply up to 3 patches to area of pain for 12 hour, then Remove & Discard patch.  May reapply patch(es) after being patch free for 12 hours.  Marland Kitchen lisinopril (PRINIVIL,ZESTRIL) 10 MG tablet TAKE ONE TABLET BY MOUTH ONE TIME DAILY  . Multiple Vitamin (MULTIVITAMIN WITH  MINERALS) TABS tablet Take 1 tablet by mouth daily.  Marland Kitchen nystatin (MYCOSTATIN/NYSTOP) powder Apply topically 4 (four) times daily.  . sertraline (ZOLOFT) 100 MG tablet Take 100 mg by mouth 2 (two) times daily.   . VENTOLIN HFA 108 (90 Base) MCG/ACT inhaler INHALE TWO PUFFS INTO LUNGS EVERY 8 HOURS AS NEEDED FOR WHEEZING FOR SHORTNESS OF BREATH  . [DISCONTINUED] Fenofibrate 150 MG CAPS TAKE 1 CAPSULE (150 MG TOTAL) BY MOUTH DAILY. (Patient not taking: Reported on 07/15/2016)  . [DISCONTINUED] Vitamin D, Ergocalciferol, (DRISDOL) 50000 units CAPS capsule Take 1 capsule  (50,000 Units total) by mouth every 7 (seven) days.   No facility-administered encounter medications on file as of 09/02/2016.       Review of Systems  Constitutional: Negative.   Respiratory: Negative.   Cardiovascular: Negative.   Neurological: Negative.   Psychiatric/Behavioral: Negative.        Objective:   Physical Exam  Constitutional: She appears well-developed and well-nourished.  Cardiovascular: Normal rate, regular rhythm and normal heart sounds.   Pulmonary/Chest: Effort normal and breath sounds normal.  Musculoskeletal:  Patient's left shoulder is tender to palpation. There is decreased abduction. Internal rotation seems okay.   BP 121/75   Pulse 89   Temp 97.2 F (36.2 C) (Oral)   Ht 5\' 1"  (1.549 m)   Wt 223 lb (101.2 kg)   BMI 42.14 kg/m         Assessment & Plan:  1. Type 2 diabetes mellitus with other neurologic complication, without long-term current use of insulin (HCC) Last A1c C was 8.7 expect improvement today. See above - Bayer DCA Hb A1c Waived  2. Vitamin D insufficiency Did not want to keep patient on high-dose vitamin D - VITAMIN D 25 Hydroxy (Vit-D Deficiency, Fractures)  3. Pain in joint of left shoulder Patient is hours schedule for MRI of her hip and I wanted to see if we could piggyback this study on to that - MR Shoulder Left Wo Contrast; Future  4. Benign essential HTN Blood pressure is well controlled at 121/75 on lisinopril  5. Fibromyalgia syndrome Symptoms are fairly well managed but dose of fentanyl has been reduced and pain is not quite as well controlled as former  6. Restless leg syndrome Patient is on amitriptyline

## 2016-09-03 LAB — VITAMIN D 25 HYDROXY (VIT D DEFICIENCY, FRACTURES): VIT D 25 HYDROXY: 19.1 ng/mL — AB (ref 30.0–100.0)

## 2016-09-10 ENCOUNTER — Ambulatory Visit (HOSPITAL_COMMUNITY): Payer: Managed Care, Other (non HMO)

## 2016-09-30 ENCOUNTER — Other Ambulatory Visit: Payer: Self-pay | Admitting: Pharmacist

## 2016-10-27 ENCOUNTER — Other Ambulatory Visit: Payer: Self-pay | Admitting: Family Medicine

## 2016-10-28 ENCOUNTER — Ambulatory Visit (INDEPENDENT_AMBULATORY_CARE_PROVIDER_SITE_OTHER): Payer: 59 | Admitting: Pharmacist

## 2016-10-28 ENCOUNTER — Encounter: Payer: Self-pay | Admitting: Pharmacist

## 2016-10-28 VITALS — BP 114/72 | HR 75 | Ht 61.0 in | Wt 222.0 lb

## 2016-10-28 DIAGNOSIS — E1149 Type 2 diabetes mellitus with other diabetic neurological complication: Secondary | ICD-10-CM

## 2016-10-28 MED ORDER — FAMOTIDINE 20 MG PO TABS
20.0000 mg | ORAL_TABLET | Freq: Two times a day (BID) | ORAL | 2 refills | Status: DC
Start: 2016-10-28 — End: 2017-08-12

## 2016-10-28 MED ORDER — FLUCONAZOLE 150 MG PO TABS: 150.0000 mg | ORAL_TABLET | Freq: Every day | ORAL | 0 refills | Status: DC

## 2016-10-28 NOTE — Patient Instructions (Signed)
Sent in prescription for fluconazole 150m - take 1 tablet for 1 dose for yeast infection.  Also get miconazole cream and apply as needed for rash itching - vaginally or under breasts.  Apply Gold Bond powder daily under breast to absorb moisture and prevent rash / yeast infection  Diabetes and Standards of Medical Care   Diabetes is complicated. You may find that your diabetes team includes a dietitian, nurse, diabetes educator, eye doctor, and more. To help everyone know what is going on and to help you get the care you deserve, the following schedule of care was developed to help keep you on track. Below are the tests, exams, vaccines, medicines, education, and plans you will need.  Blood Glucose Goals Prior to meals = 80 - 130 Within 2 hours of the start of a meal = less than 180  HbA1c test (goal is less than 7.0% - your last value was 7.3%) This test shows how well you have controlled your glucose over the past 2 to 3 months. It is used to see if your diabetes management plan needs to be adjusted.   It is performed at least 2 times a year if you are meeting treatment goals.  It is performed 4 times a year if therapy has changed or if you are not meeting treatment goals.  Blood pressure test  This test is performed at every routine medical visit. The goal is less than 140/90 mmHg for most people, but 130/80 mmHg in some cases. Ask your health care provider about your goal.  Dental exam  Follow up with the dentist regularly.  Eye exam  If you are diagnosed with type 1 diabetes as a child, get an exam upon reaching the age of 149years or older and have had diabetes for 3 to 5 years. Yearly eye exams are recommended after that initial eye exam.  If you are diagnosed with type 1 diabetes as an adult, get an exam within 5 years of diagnosis and then yearly.  If you are diagnosed with type 2 diabetes, get an exam as soon as possible after the diagnosis and then yearly.  Foot care  exam  Visual foot exams are performed at every routine medical visit. The exams check for cuts, injuries, or other problems with the feet.  A comprehensive foot exam should be done yearly. This includes visual inspection as well as assessing foot pulses and testing for loss of sensation.  Check your feet nightly for cuts, injuries, or other problems with your feet. Tell your health care provider if anything is not healing.  Kidney function test (urine microalbumin)  This test is performed once a year.  Type 1 diabetes: The first test is performed 5 years after diagnosis.  Type 2 diabetes: The first test is performed at the time of diagnosis.  A serum creatinine and estimated glomerular filtration rate (eGFR) test is done once a year to assess the level of chronic kidney disease (CKD), if present.  Lipid profile (cholesterol, HDL, LDL, triglycerides)  Performed every 5 years for most people.  The goal for LDL is less than 100 mg/dL. If you are at high risk, the goal is less than 70 mg/dL.  The goal for HDL is 40 mg/dL to 50 mg/dL for men and 50 mg/dL to 60 mg/dL for women. An HDL cholesterol of 60 mg/dL or higher gives some protection against heart disease.  The goal for triglycerides is less than 150 mg/dL.  Influenza vaccine, pneumococcal vaccine,  and hepatitis B vaccine  The influenza vaccine is recommended yearly.  The pneumococcal vaccine is generally given once in a lifetime. However, there are some instances when another vaccination is recommended. Check with your health care provider.  The hepatitis B vaccine is also recommended for adults with diabetes.  Diabetes self-management education  Education is recommended at diagnosis and ongoing as needed.  Treatment plan  Your treatment plan is reviewed at every medical visit.  Document Released: 08/03/2009 Document Revised: 06/08/2013 Document Reviewed: 03/08/2013 [facility] Patient Information 2014 [place].

## 2016-10-28 NOTE — Progress Notes (Signed)
Patient ID: Casey Sanders Age, female   DOB: [birth year], 56 y.o.   MRN: [id number]   Subjective:    Casey Sanders is a 56 y.o. female who presents for a followup evaluation of Type 2 diabetes mellitus.  Casey Sanders reports that BG readings at home have been good and that she is tolerating medicaitons well except that she recently has had some vaginal burning and itching and wonders if this is related to [place].  Current diabetes medications include:  Trulicity 1.5mg  SQ weekly; Jardiance 10mg  1 tablet daily, glimepiride 4mg  bid.    HBG ranges form 130's to 178 per patient she did not bring in glucometer today  Known diabetic complications: peripheral neuropathy Cardiovascular risk factors: diabetes mellitus, dyslipidemia, hypertension, obesity (BMI >= 30 kg/m2) and sedentary lifestyle Eye exam current (within one year): yes Weight trend: stable Prior visit with CDE: yes -   Current diet: in general, a "healthy" diet   Current exercise: none Medication Compliance?  Yes  Is She on ACE inhibitor or angiotensin II receptor blocker?  Yes  lisinopril (Prinivil)    Objective:    BP 114/72 (BP Location: Left Arm, Patient Position: Sitting, Cuff Size: Large)   Pulse 75   Ht 5\' 1"  (1.549 m)   Wt 222 lb (100.7 kg)   BMI 41.95 kg/m    A1c = 7.3% (09/02/2016)  Lab Review Glucose (mg/dL)  Date Value  07/15/2016 130 (H)  05/23/2016 205 (H)  12/10/2015 242 (H)   Glucose, Bld (mg/dL)  Date Value  07/07/2013 191 (H)  02/27/2011 198 (H)  02/12/2011 155 (H)   CO2 (mmol/L)  Date Value  07/15/2016 24  05/23/2016 26  12/10/2015 23   BUN (mg/dL)  Date Value  07/15/2016 18  05/23/2016 14  12/10/2015 13   Creatinine, Ser (mg/dL)  Date Value  07/15/2016 0.89  05/23/2016 0.75  12/10/2015 0.78   none    Assessment:    Diabetes Mellitus type II, under good control.   Vagnial yeast infections possibly related to [place]:    1.  Rx changes: diflucan 150mg  1 tablet for 1  dose of yeast infection.  She is advised that if does not improve for worsens to call office for appt with PCP.   2.  Education: Reviewed 'ABCs' of diabetes management (respective goals in parentheses):  A1C (<7), blood pressure (<130/80), and cholesterol (LDL <100). 3.   Reviewed CHO counting diet discussed.  Reviewed CHO amount in various foods and how to read nutrition labels.  Discussed recommended serving sizes.  4.  Recommend check BG 1  times a day 6.  Recommended increase physical activity - goal is 150 minutes per week 7. Follow up: 2 months  with PCP and 6 months with me.

## 2016-12-09 ENCOUNTER — Ambulatory Visit (INDEPENDENT_AMBULATORY_CARE_PROVIDER_SITE_OTHER): Payer: 59 | Admitting: Family Medicine

## 2016-12-09 ENCOUNTER — Encounter: Payer: Self-pay | Admitting: Family Medicine

## 2016-12-09 VITALS — BP 113/72 | HR 102 | Temp 98.0°F | Ht 61.0 in | Wt 218.0 lb

## 2016-12-09 DIAGNOSIS — E1149 Type 2 diabetes mellitus with other diabetic neurological complication: Secondary | ICD-10-CM

## 2016-12-09 DIAGNOSIS — E559 Vitamin D deficiency, unspecified: Secondary | ICD-10-CM | POA: Diagnosis not present

## 2016-12-09 DIAGNOSIS — I10 Essential (primary) hypertension: Secondary | ICD-10-CM | POA: Diagnosis not present

## 2016-12-09 DIAGNOSIS — M797 Fibromyalgia: Secondary | ICD-10-CM | POA: Diagnosis not present

## 2016-12-09 DIAGNOSIS — R7989 Other specified abnormal findings of blood chemistry: Secondary | ICD-10-CM

## 2016-12-09 DIAGNOSIS — E7800 Pure hypercholesterolemia, unspecified: Secondary | ICD-10-CM

## 2016-12-09 LAB — BAYER DCA HB A1C WAIVED: HB A1C (BAYER DCA - WAIVED): 8.3 % — ABNORMAL HIGH (ref <7.0)

## 2016-12-09 NOTE — Progress Notes (Signed)
Subjective:    Patient ID: Casey Sanders, female    DOB: Sep 24, 1961, 56 y.o.   MRN: [id number]  HPI Pt here for follow up and management of chronic medical problems which includes diabetes, hypertension and hyperlipidemia. She is taking medication regularly. Patient is upset today because she has been taken off of her clonazepam because she uses fentanyl for pain. She was given BuSpar as a substitute which is just not effective once most people of been on a benzodiazepine. She tells me that her sugars have gone up as a result and she is not sleeping.    Patient Active Problem List   Diagnosis Date Noted  . GAD (generalized anxiety disorder) 05/23/2016  . GERD (gastroesophageal reflux disease) 05/23/2016  . Depression 05/23/2016  . Fibromyalgia syndrome 05/23/2016  . Family history of cardiac disorder 05/23/2016  . Cystitis 02/08/2015  . Rectal bleeding 08/22/2013  . Diabetic neuropathy, type II diabetes mellitus ([place]) 03/17/2013  . Degenerative disc disease, lumbar 01/04/2013  . Chest pain, unspecified 02/12/2011  . Syncope 02/12/2011  . Pre-operative cardiovascular examination 02/12/2011  . Diabetes mellitus ([place]) 01/04/2011  . Chronic pain 01/04/2011  . Diverticul disease small and large intestine, no perforati or abscess 01/04/2011  . Benign essential HTN 01/04/2011  . Hyperlipidemia 01/04/2011  . Restless leg syndrome 01/04/2011  . Cervical adenocarcinoma ([place]) 01/04/2011  . Palpitations 01/04/2011   Outpatient Encounter Prescriptions as of 12/09/2016  Medication Sig  . amitriptyline (ELAVIL) 25 MG tablet Take one or two tablets at bedtime.  Marland Kitchen aspirin 81 MG EC tablet Take 81 mg by mouth daily.    Marland Kitchen atorvastatin (LIPITOR) 80 MG tablet TAKE ONE TABLET BY MOUTH ONCE DAILY AT  6  PM (Patient taking differently: TAKE ONE-HALF TABLET BY MOUTH ONCE DAILY AT  6  PM)  . busPIRone (BUSPAR) 30 MG tablet Take 1 tablet by mouth 2 (two) times daily.  . cyclobenzaprine (FLEXERIL) 10 MG  tablet Take 1 tablet by mouth 3 (three) times daily as needed.  . diclofenac sodium (VOLTAREN) 1 % GEL Apply 2 g topically 4 (four) times daily.  . Dulaglutide (TRULICITY) 1.5 KG/2.5KY SOPN Inject 1.5 mg into the skin once a week.  . famotidine (PEPCID) 20 MG tablet Take 1 tablet (20 mg total) by mouth 2 (two) times daily.  . fentaNYL (DURAGESIC - DOSED MCG/HR) 25 MCG/HR patch PLACE 1 PATCH ONTO THE SKIN EVERY THIRD DAY, REMOVE OLD PATCH BEFORE PLACING NEW PATCH  . fexofenadine (ALLEGRA) 60 MG tablet Take 60 mg by mouth 2 (two) times daily.    Marland Kitchen FINGERSTIX LANCETS MISC Use to check BG up to bid.  Dx:  250.00  . fish oil-omega-3 fatty acids 1000 MG capsule Take 2 g by mouth daily.    . fluticasone (FLONASE) 50 MCG/ACT nasal spray 2 sprays by Nasal route daily. 1 spray each nostril qd   . gabapentin (NEURONTIN) 800 MG tablet Take 800 mg by mouth 4 (four) times daily.   Marland Kitchen glimepiride (AMARYL) 4 MG tablet Take 1 tablet (4 mg total) by mouth 2 (two) times daily. As directed  . glucose blood (TRUETRACK TEST) test strip Use to check BG up to bid.  Dx:  250.00  . JARDIANCE 10 MG TABS tablet TAKE ONE TABLET BY MOUTH ONCE DAILY  . lidocaine (LIDODERM) 5 % Place 3 patches onto the skin daily. Apply up to 3 patches to area of pain for 12 hour, then Remove & Discard patch.  May reapply patch(es) after being  patch free for 12 hours.  Marland Kitchen lisinopril (PRINIVIL,ZESTRIL) 10 MG tablet TAKE ONE TABLET BY MOUTH ONE TIME DAILY  . Multiple Vitamin (MULTIVITAMIN WITH MINERALS) TABS tablet Take 1 tablet by mouth daily.  . sertraline (ZOLOFT) 100 MG tablet Take 100 mg by mouth 2 (two) times daily.   . VENTOLIN HFA 108 (90 Base) MCG/ACT inhaler INHALE TWO PUFFS INTO LUNGS EVERY 8 HOURS AS NEEDED FOR WHEEZING FOR SHORTNESS OF BREATH  . [DISCONTINUED] clonazePAM (KLONOPIN) 1 MG tablet Take 1 mg by mouth 5 (five) times daily as needed.   . [DISCONTINUED] fluconazole (DIFLUCAN) 150 MG tablet Take 1 tablet (150 mg total) by mouth  daily.   No facility-administered encounter medications on file as of 12/09/2016.      Review of Systems  Constitutional: Negative.   HENT: Negative.   Eyes: Negative.   Respiratory: Negative.   Cardiovascular: Negative.   Gastrointestinal: Negative.   Endocrine: Negative.   Genitourinary: Negative.   Musculoskeletal: Negative.   Skin: Negative.   Allergic/Immunologic: Negative.   Neurological: Negative.   Hematological: Negative.   Psychiatric/Behavioral: Negative.        Objective:   Physical Exam  Constitutional: She appears well-developed and well-nourished.  Cardiovascular: Normal rate, regular rhythm and normal heart sounds.   Pulmonary/Chest: Effort normal and breath sounds normal.   BP 113/72 (BP Location: Left Arm)   Pulse (!) 102   Temp 98 F (36.7 C) (Oral)   Ht '5\' 1"'  (1.549 m)   Wt 218 lb (98.9 kg)   BMI 41.19 kg/m         Assessment & Plan:  1. Type 2 diabetes mellitus with other neurologic complication, without long-term current use of insulin (HCC) Last A1c was 7.3. Per her history it may be elevated today due to changes with her other than diabetic medicines. - Bayer DCA Hb A1c Waived  *  3. Benign essential HTN Blood pressure is good today at 113/72 - CMP14+EGFR  4. Low serum vitamin D We will check at request of her orthopedist  5. Pure hypercholesterolemia Taking Fish oil - Lipid panel  6. Fibromyalgia syndrome She needs to continue on her fentanyl patch. Hopefully we can resolve the problem with her benzo. I have put in a call to her psychiatrist about this problem  Wardell Honour MD

## 2016-12-09 NOTE — Patient Instructions (Signed)
Continue current medications. Continue good therapeutic lifestyle changes which include good diet and exercise. Fall precautions discussed with patient. If an FOBT was given today- please return it to our front desk. If you are over 58 years old - you may need Prevnar 13 or the adult Pneumonia vaccine.  **Flu shots are available--- please call and schedule a FLU-CLINIC appointment**  After your visit with us today you will receive a survey in the mail or online from [employer] regarding your care with us. Please take a moment to fill this out. Your feedback is very important to us as you can help us better understand your patient needs as well as improve your experience and satisfaction. WE CARE ABOUT YOU!!!    

## 2016-12-10 LAB — VITAMIN B12: Vitamin B-12: 1352 pg/mL — ABNORMAL HIGH (ref 232–1245)

## 2016-12-10 LAB — CMP14+EGFR
A/G RATIO: 1.7 (ref 1.2–2.2)
ALBUMIN: 4.8 g/dL (ref 3.5–5.5)
ALT: 37 IU/L — AB (ref 0–32)
AST: 36 IU/L (ref 0–40)
Alkaline Phosphatase: 57 IU/L (ref 39–117)
BUN / CREAT RATIO: 17 (ref 9–23)
BUN: 15 mg/dL (ref 6–24)
Bilirubin Total: 0.7 mg/dL (ref 0.0–1.2)
CALCIUM: 10 mg/dL (ref 8.7–10.2)
CHLORIDE: 95 mmol/L — AB (ref 96–106)
CO2: 24 mmol/L (ref 18–29)
Creatinine, Ser: 0.88 mg/dL (ref 0.57–1.00)
GFR, EST AFRICAN AMERICAN: 84 (ref >59)
GFR, EST NON AFRICAN AMERICAN: 73 (ref >59)
Globulin, Total: 2.8 (ref 1.5–4.5)
Glucose: 186 mg/dL — ABNORMAL HIGH (ref 65–99)
POTASSIUM: 5 mmol/L (ref 3.5–5.2)
Sodium: 139 mmol/L (ref 134–144)
TOTAL PROTEIN: 7.6 g/dL (ref 6.0–8.5)

## 2016-12-10 LAB — LIPID PANEL
CHOL/HDL RATIO: 2.7 (ref 0.0–4.4)
Cholesterol, Total: 140 mg/dL (ref 100–199)
HDL: 51 mg/dL (ref >39)
LDL Calculated: 64 (ref 0–99)
Triglycerides: 125 mg/dL (ref 0–149)
VLDL CHOLESTEROL CAL: 25 (ref 5–40)

## 2016-12-10 LAB — VITAMIN D 25 HYDROXY (VIT D DEFICIENCY, FRACTURES): Vit D, 25-Hydroxy: 16 ng/mL — ABNORMAL LOW (ref 30.0–100.0)

## 2016-12-11 ENCOUNTER — Other Ambulatory Visit: Payer: Self-pay | Admitting: Family Medicine

## 2017-02-20 ENCOUNTER — Telehealth: Payer: Self-pay | Admitting: Family Medicine

## 2017-02-20 NOTE — Telephone Encounter (Signed)
Was Casey Sanders pt- has apt to see [name] 5/21 Patient is requesting a refill on her Vit D 50000 units. Last Vit D was 2 months ago and it was 16. Please advise

## 2017-02-20 NOTE — Telephone Encounter (Signed)
Patient aware to take OTC vit d 2000 units daily until appt with Dr. [name] on 5/21

## 2017-03-05 ENCOUNTER — Other Ambulatory Visit: Payer: Self-pay | Admitting: Family Medicine

## 2017-03-09 ENCOUNTER — Ambulatory Visit (INDEPENDENT_AMBULATORY_CARE_PROVIDER_SITE_OTHER): Payer: 59 | Admitting: Pediatrics

## 2017-03-09 ENCOUNTER — Encounter: Payer: Self-pay | Admitting: Pediatrics

## 2017-03-09 VITALS — BP 121/75 | HR 96 | Temp 98.1°F | Ht 61.0 in | Wt 224.4 lb

## 2017-03-09 DIAGNOSIS — I10 Essential (primary) hypertension: Secondary | ICD-10-CM

## 2017-03-09 DIAGNOSIS — E7800 Pure hypercholesterolemia, unspecified: Secondary | ICD-10-CM | POA: Diagnosis not present

## 2017-03-09 DIAGNOSIS — E1149 Type 2 diabetes mellitus with other diabetic neurological complication: Secondary | ICD-10-CM

## 2017-03-09 DIAGNOSIS — G8929 Other chronic pain: Secondary | ICD-10-CM

## 2017-03-09 DIAGNOSIS — E559 Vitamin D deficiency, unspecified: Secondary | ICD-10-CM | POA: Diagnosis not present

## 2017-03-09 DIAGNOSIS — J45909 Unspecified asthma, uncomplicated: Secondary | ICD-10-CM | POA: Insufficient documentation

## 2017-03-09 LAB — BAYER DCA HB A1C WAIVED: HB A1C: 8.7 % — AB (ref <7.0)

## 2017-03-09 MED ORDER — ALBUTEROL SULFATE HFA 108 (90 BASE) MCG/ACT IN AERS: 1.0000 | INHALATION_SPRAY | Freq: Four times a day (QID) | RESPIRATORY_TRACT | 1 refills | Status: DC | PRN

## 2017-03-09 MED ORDER — EMPAGLIFLOZIN 25 MG PO TABS
25.0000 mg | ORAL_TABLET | Freq: Every day | ORAL | 2 refills | Status: DC
Start: 2017-03-09 — End: 2017-07-21

## 2017-03-09 NOTE — Progress Notes (Signed)
  Subjective:   Patient ID: Casey Sanders, female    DOB: Jan 15, 1961, 56 y.o.   MRN: [id number] CC: Follow-up (3 month) multiple med problems  HPI: Casey Sanders is a 56 y.o. female presenting for Follow-up (3 month)  When she gets nervous she tends to eat more Has been feeling more nervous recently Eating a lot of mac and cheese recently Avoiding red meat Eating a lot of vegetables, lettuce Has a 23yo grandson and 72yo grandaughter, 2yo grandson Says she eats their snack foods at times Mostly drinking water  DM2: checking BGLs in the morning BGLs tend to be higher when she is in pain 252 this morning fasting, range 160-180 in the morning  Neuropathy: taking gabapentin, following with pain clinic  Fibromyalgia: followed by pain management Limited in mobility, uses power chair at home and at store Uses power chair and walker when she goes long distances  HLD: takes 54m daily, when on 833mfelt shaky  H/o asthma: has been taking inhaler more often  Sees Dr. KaVic Ripperor psychiatry/anxiety  Has been on replacement vitamin D recently for low vit D levels  Relevant past medical, surgical, family and social history reviewed. Allergies and medications reviewed and updated. History  Smoking Status  . Former Smoker  Smokeless Tobacco  . Never Used   ROS: Per HPI   Objective:    BP 121/75   Pulse 96   Temp 98.1 F (36.7 C) (Oral)   Ht _0  (1.549 m)   Wt 224 lb 6.4 oz (101.8 kg)   BMI 42.40 kg/m   Wt Readings from Last 3 Encounters:  03/09/17 224 lb 6.4 oz (101.8 kg)  12/09/16 218 lb (98.9 kg)  10/28/16 222 lb (100.7 kg)    Gen: NAD, alert, cooperative with exam, NCAT EYES: EOMI, no conjunctival injection, or no icterus CV: NRRR, normal S1/S2, no murmur Resp: CTABL, no wheezes, normal WOB Abd: +BS, soft, NTND. Ext: No edema, warm Neuro: Alert and oriented MSK: normal muscle bulk  Assessment & Plan:  BaNelsyas seen today for follow-up.  Diagnoses and all  orders for this visit:  Type 2 diabetes mellitus with other neurologic complication, without long-term current use of insulin (HCC) A1c up to 8.7 Increase jardiance to 2566mcont glimeperide unchanged Intolerant of metformin -     Bayer DCA Hb A1c Waived -     empagliflozin (JARDIANCE) 25 MG TABS tablet; Take 25 mg by mouth daily.  Benign essential HTN Adequate control today, cont current med  Pure hypercholesterolemia Taking 77m22mily, stable, wants to conitnue this dose, no side effects  Other chronic pain Stable, Follows at pain center  Uncomplicated asthma, unspecified asthma severity, unspecified whether persistent Stable, normal exam today Use as needed, if feeling anxious try other stress relief techniques first, if needing regularly let m eknow -     albuterol (VENTOLIN HFA) 108 (90 Base) MCG/ACT inhaler; Inhale 1 puff into the lungs every 6 (six) hours as needed for wheezing or shortness of breath.  Vitamin D deficiency -     VITAMIN D 25 Hydroxy (Vit-D Deficiency, Fractures)  Follow up plan: Return in about 3 months (around 06/09/2017). [facility] [place]

## 2017-03-10 LAB — VITAMIN D 25 HYDROXY (VIT D DEFICIENCY, FRACTURES): VIT D 25 HYDROXY: 14.6 ng/mL — AB (ref 30.0–100.0)

## 2017-03-12 ENCOUNTER — Other Ambulatory Visit: Payer: Self-pay | Admitting: Pediatrics

## 2017-03-12 DIAGNOSIS — E559 Vitamin D deficiency, unspecified: Secondary | ICD-10-CM

## 2017-03-12 MED ORDER — VITAMIN D (ERGOCALCIFEROL) 1.25 MG (50000 UNIT) PO CAPS: 50000.0000 [IU] | ORAL_CAPSULE | ORAL | 0 refills | Status: DC

## 2017-04-17 ENCOUNTER — Other Ambulatory Visit: Payer: Self-pay | Admitting: Family

## 2017-04-29 ENCOUNTER — Ambulatory Visit: Payer: Self-pay | Admitting: Pharmacist

## 2017-05-10 ENCOUNTER — Other Ambulatory Visit: Payer: Self-pay | Admitting: Family

## 2017-05-10 DIAGNOSIS — E1149 Type 2 diabetes mellitus with other diabetic neurological complication: Secondary | ICD-10-CM

## 2017-05-23 ENCOUNTER — Other Ambulatory Visit: Payer: Self-pay | Admitting: Pediatrics

## 2017-05-25 NOTE — Telephone Encounter (Signed)
Last seen 03/09/17  Dr [name]  Requesting 90 day supply

## 2017-05-27 ENCOUNTER — Other Ambulatory Visit: Payer: Self-pay | Admitting: *Deleted

## 2017-05-27 MED ORDER — GLIMEPIRIDE 4 MG PO TABS: 4.0000 mg | ORAL_TABLET | Freq: Two times a day (BID) | ORAL | 1 refills | Status: DC

## 2017-05-27 NOTE — Telephone Encounter (Signed)
Rx sent to pharmacy   

## 2017-05-28 ENCOUNTER — Ambulatory Visit: Payer: Self-pay | Admitting: Pharmacist

## 2017-06-18 ENCOUNTER — Ambulatory Visit: Payer: 59 | Admitting: Pediatrics

## 2017-07-02 ENCOUNTER — Ambulatory Visit: Payer: 59 | Admitting: Pediatrics

## 2017-07-05 ENCOUNTER — Other Ambulatory Visit: Payer: Self-pay | Admitting: Family Medicine

## 2017-07-21 ENCOUNTER — Other Ambulatory Visit: Payer: Self-pay | Admitting: Pediatrics

## 2017-07-21 DIAGNOSIS — E1149 Type 2 diabetes mellitus with other diabetic neurological complication: Secondary | ICD-10-CM

## 2017-08-06 ENCOUNTER — Other Ambulatory Visit: Payer: Self-pay | Admitting: Pediatrics

## 2017-08-06 DIAGNOSIS — E1149 Type 2 diabetes mellitus with other diabetic neurological complication: Secondary | ICD-10-CM

## 2017-08-09 ENCOUNTER — Inpatient Hospital Stay (HOSPITAL_COMMUNITY)
Admission: EM | Admit: 2017-08-09 | Discharge: 2017-08-12 | DRG: 872 | Disposition: A | Discharge status: Home / Self Care | Payer: 59 | Attending: Internal Medicine | Admitting: Internal Medicine

## 2017-08-09 ENCOUNTER — Encounter (HOSPITAL_COMMUNITY): Payer: Self-pay | Admitting: *Deleted

## 2017-08-09 ENCOUNTER — Emergency Department (HOSPITAL_COMMUNITY): Payer: 59

## 2017-08-09 DIAGNOSIS — R531 Weakness: Secondary | ICD-10-CM | POA: Diagnosis present

## 2017-08-09 DIAGNOSIS — S0990XA Unspecified injury of head, initial encounter: Secondary | ICD-10-CM

## 2017-08-09 DIAGNOSIS — E871 Hypo-osmolality and hyponatremia: Secondary | ICD-10-CM | POA: Diagnosis present

## 2017-08-09 DIAGNOSIS — R6520 Severe sepsis without septic shock: Secondary | ICD-10-CM | POA: Diagnosis present

## 2017-08-09 DIAGNOSIS — F419 Anxiety disorder, unspecified: Secondary | ICD-10-CM | POA: Diagnosis present

## 2017-08-09 DIAGNOSIS — E1140 Type 2 diabetes mellitus with diabetic neuropathy, unspecified: Secondary | ICD-10-CM | POA: Diagnosis present

## 2017-08-09 DIAGNOSIS — F329 Major depressive disorder, single episode, unspecified: Secondary | ICD-10-CM | POA: Diagnosis present

## 2017-08-09 DIAGNOSIS — A4151 Sepsis due to Escherichia coli [E. coli]: Principal | ICD-10-CM | POA: Diagnosis present

## 2017-08-09 DIAGNOSIS — B9620 Unspecified Escherichia coli [E. coli] as the cause of diseases classified elsewhere: Secondary | ICD-10-CM | POA: Diagnosis present

## 2017-08-09 DIAGNOSIS — R5381 Other malaise: Secondary | ICD-10-CM | POA: Diagnosis present

## 2017-08-09 DIAGNOSIS — A419 Sepsis, unspecified organism: Secondary | ICD-10-CM | POA: Diagnosis not present

## 2017-08-09 DIAGNOSIS — M5136 Other intervertebral disc degeneration, lumbar region: Secondary | ICD-10-CM | POA: Diagnosis present

## 2017-08-09 DIAGNOSIS — E669 Obesity, unspecified: Secondary | ICD-10-CM | POA: Diagnosis present

## 2017-08-09 DIAGNOSIS — W19XXXA Unspecified fall, initial encounter: Secondary | ICD-10-CM

## 2017-08-09 DIAGNOSIS — N390 Urinary tract infection, site not specified: Secondary | ICD-10-CM | POA: Diagnosis not present

## 2017-08-09 DIAGNOSIS — R2681 Unsteadiness on feet: Secondary | ICD-10-CM | POA: Diagnosis present

## 2017-08-09 DIAGNOSIS — R81 Glycosuria: Secondary | ICD-10-CM | POA: Diagnosis present

## 2017-08-09 DIAGNOSIS — Z885 Allergy status to narcotic agent status: Secondary | ICD-10-CM

## 2017-08-09 DIAGNOSIS — Z6841 Body Mass Index (BMI) 40.0 and over, adult: Secondary | ICD-10-CM

## 2017-08-09 DIAGNOSIS — N3000 Acute cystitis without hematuria: Secondary | ICD-10-CM | POA: Diagnosis present

## 2017-08-09 DIAGNOSIS — E860 Dehydration: Secondary | ICD-10-CM | POA: Diagnosis present

## 2017-08-09 DIAGNOSIS — R296 Repeated falls: Secondary | ICD-10-CM | POA: Diagnosis present

## 2017-08-09 DIAGNOSIS — M797 Fibromyalgia: Secondary | ICD-10-CM | POA: Diagnosis present

## 2017-08-09 DIAGNOSIS — Z888 Allergy status to other drugs, medicaments and biological substances status: Secondary | ICD-10-CM

## 2017-08-09 DIAGNOSIS — J45909 Unspecified asthma, uncomplicated: Secondary | ICD-10-CM | POA: Diagnosis present

## 2017-08-09 DIAGNOSIS — G629 Polyneuropathy, unspecified: Secondary | ICD-10-CM | POA: Diagnosis present

## 2017-08-09 DIAGNOSIS — T426X5A Adverse effect of other antiepileptic and sedative-hypnotic drugs, initial encounter: Secondary | ICD-10-CM | POA: Diagnosis present

## 2017-08-09 DIAGNOSIS — R32 Unspecified urinary incontinence: Secondary | ICD-10-CM | POA: Diagnosis present

## 2017-08-09 DIAGNOSIS — Z8679 Personal history of other diseases of the circulatory system: Secondary | ICD-10-CM

## 2017-08-09 DIAGNOSIS — Z7984 Long term (current) use of oral hypoglycemic drugs: Secondary | ICD-10-CM

## 2017-08-09 DIAGNOSIS — Z87891 Personal history of nicotine dependence: Secondary | ICD-10-CM

## 2017-08-09 DIAGNOSIS — I10 Essential (primary) hypertension: Secondary | ICD-10-CM | POA: Diagnosis present

## 2017-08-09 DIAGNOSIS — Z794 Long term (current) use of insulin: Secondary | ICD-10-CM

## 2017-08-09 HISTORY — DX: Sepsis, unspecified organism: N39.0

## 2017-08-09 HISTORY — DX: Urinary tract infection, site not specified: A41.9

## 2017-08-09 LAB — CK: CK TOTAL: 401 U/L — AB (ref 38–234)

## 2017-08-09 LAB — COMPREHENSIVE METABOLIC PANEL
ALT: 40 U/L (ref 14–54)
ANION GAP: 8 (ref 5–15)
AST: 44 U/L — ABNORMAL HIGH (ref 15–41)
Albumin: 3.2 g/dL — ABNORMAL LOW (ref 3.5–5.0)
Alkaline Phosphatase: 50 U/L (ref 38–126)
BILIRUBIN TOTAL: 1.2 mg/dL (ref 0.3–1.2)
BUN: 24 mg/dL — AB (ref 6–20)
CHLORIDE: 98 mmol/L — AB (ref 101–111)
CO2: 21 mmol/L — ABNORMAL LOW (ref 22–32)
Calcium: 8.1 mg/dL — ABNORMAL LOW (ref 8.9–10.3)
Creatinine, Ser: 1.09 mg/dL — ABNORMAL HIGH (ref 0.44–1.00)
GFR, EST NON AFRICAN AMERICAN: 55 mL/min — AB (ref >60)
Glucose, Bld: 258 mg/dL — ABNORMAL HIGH (ref 65–99)
POTASSIUM: 3.5 mmol/L (ref 3.5–5.1)
Sodium: 127 mmol/L — ABNORMAL LOW (ref 135–145)
TOTAL PROTEIN: 7 g/dL (ref 6.5–8.1)

## 2017-08-09 LAB — CBC WITH DIFFERENTIAL/PLATELET
BASOS ABS: 0 10*3/uL (ref 0.0–0.1)
Basophils Relative: 0 %
EOS PCT: 0 %
Eosinophils Absolute: 0 10*3/uL (ref 0.0–0.7)
HEMATOCRIT: 34.3 % — AB (ref 36.0–46.0)
HEMOGLOBIN: 11.6 g/dL — AB (ref 12.0–15.0)
LYMPHS ABS: 0.6 10*3/uL — AB (ref 0.7–4.0)
Lymphocytes Relative: 8 %
MCH: 27.6 pg (ref 26.0–34.0)
MCHC: 33.8 g/dL (ref 30.0–36.0)
MCV: 81.5 fL (ref 78.0–100.0)
MONO ABS: 0.4 10*3/uL (ref 0.1–1.0)
Monocytes Relative: 6 %
Neutro Abs: 6.1 10*3/uL (ref 1.7–7.7)
Neutrophils Relative %: 86 %
Platelets: 147 10*3/uL — ABNORMAL LOW (ref 150–400)
RBC: 4.21 MIL/uL (ref 3.87–5.11)
RDW: 13.8 % (ref 11.5–15.5)
WBC: 7.2 10*3/uL (ref 4.0–10.5)

## 2017-08-09 LAB — INFLUENZA PANEL BY PCR (TYPE A & B)
INFLAPCR: NEGATIVE
INFLBPCR: NEGATIVE

## 2017-08-09 LAB — URINALYSIS, ROUTINE W REFLEX MICROSCOPIC
Bacteria, UA: NONE SEEN
Bilirubin Urine: NEGATIVE
Glucose, UA: >=500 mg/dL — AB
KETONES UR: NEGATIVE mg/dL
Nitrite: NEGATIVE
PROTEIN: 100 mg/dL — AB
Specific Gravity, Urine: 1.013 (ref 1.005–1.030)
Squamous Epithelial / LPF: NONE SEEN
pH: 5 (ref 5.0–8.0)

## 2017-08-09 LAB — PROTIME-INR
INR: 1.21
Prothrombin Time: 15.2 seconds (ref 11.4–15.2)

## 2017-08-09 LAB — CBG MONITORING, ED: Glucose-Capillary: 233 mg/dL — ABNORMAL HIGH (ref 65–99)

## 2017-08-09 LAB — I-STAT CG4 LACTIC ACID, ED: LACTIC ACID, VENOUS: 1.11 mmol/L (ref 0.5–1.9)

## 2017-08-09 IMAGING — CT CT HEAD W/O CM
5 of 8 series · 16 of 47 positions shown, 18 images · non-contrast
Comparison: None.

CLINICAL DATA: Recent falls

EXAM:
CT HEAD WITHOUT CONTRAST
CT CERVICAL SPINE WITHOUT CONTRAST
TECHNIQUE: Multidetector CT imaging of the head and cervical spine was
performed following the standard protocol without intravenous
contrast. Multiplanar CT image reconstructions of the cervical spine
were also generated.

[Series 4: head wo · axial · 0.42mm/px · z∈[-74,-19]mm · 2 of 33 slices shown]
[im 11/33  brain]
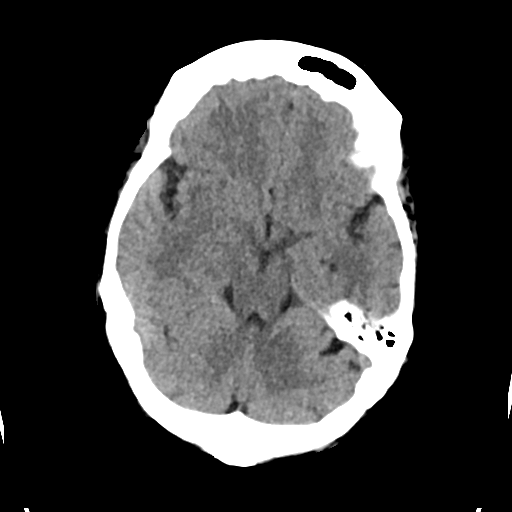
[im 22/33  brain]
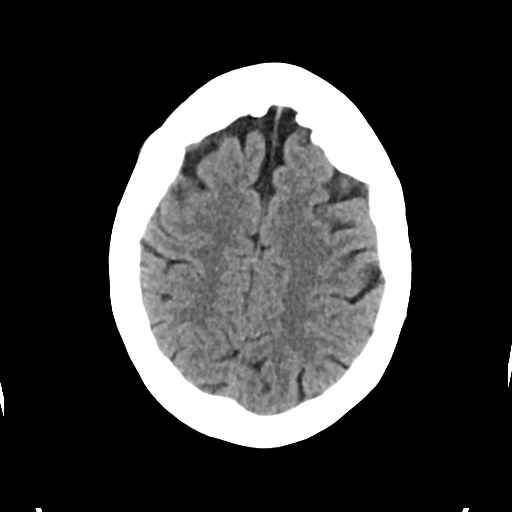

[Series 5: head bone · axial · 0.42mm/px · z∈[-98,-72]mm · 2 of 81 slices shown]
[im 14/81  bone]
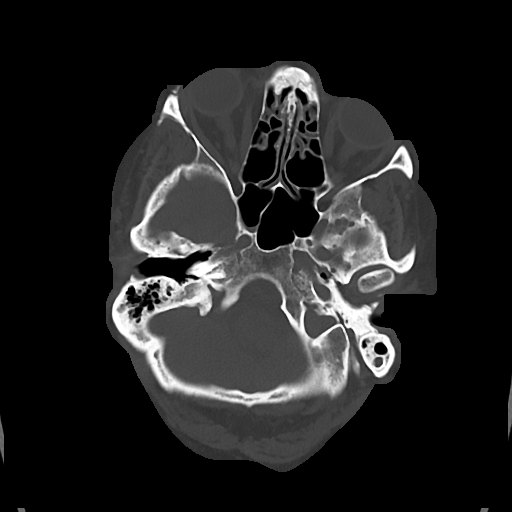
[im 27/81  bone]
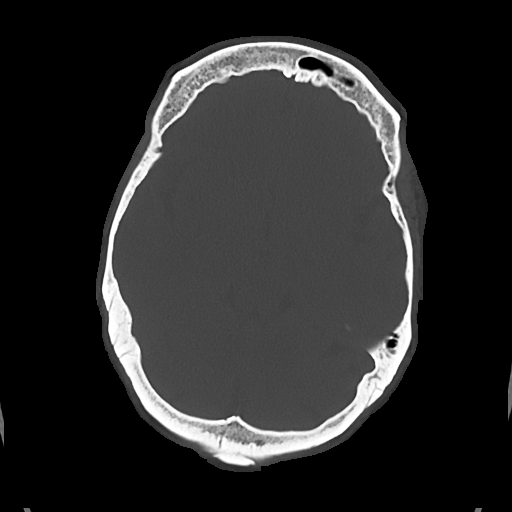

[Series 6: cor soft · coronal · 0.35mm/px · 3 of 67 slices shown]
[im 17/67  brain]
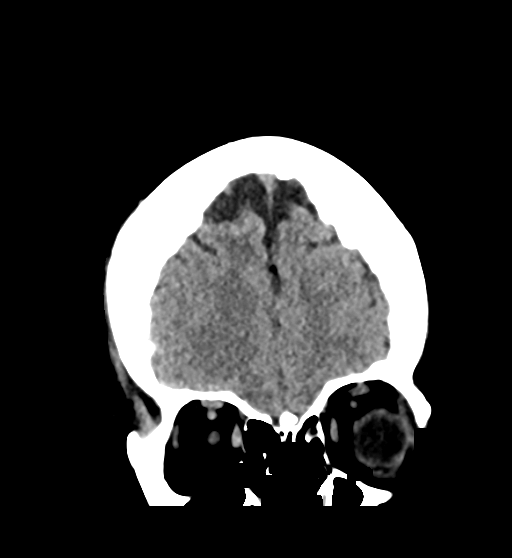
[im 34/67  brain]
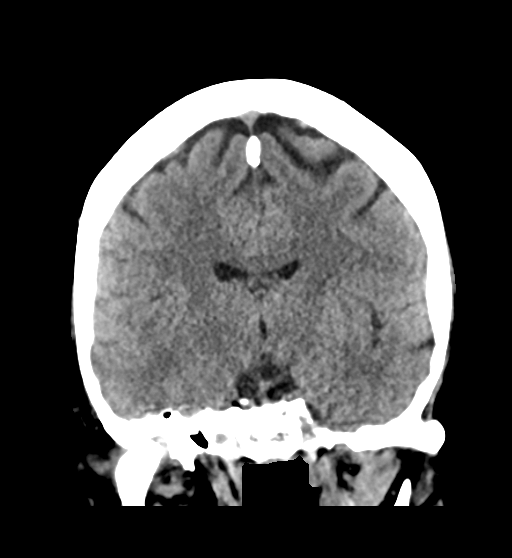
[im 50/67  brain]
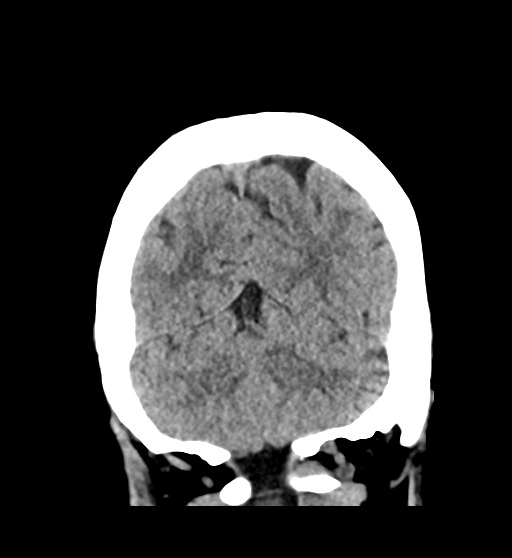

[Series 7: sag soft · sagittal · 0.31mm/px · 2 of 67 slices shown]
[im 23/67  brain]
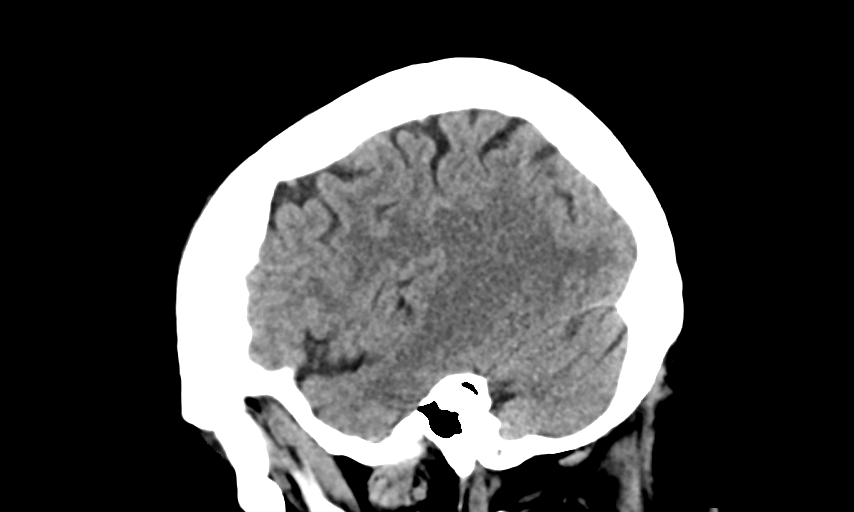
[im 45/67  brain]
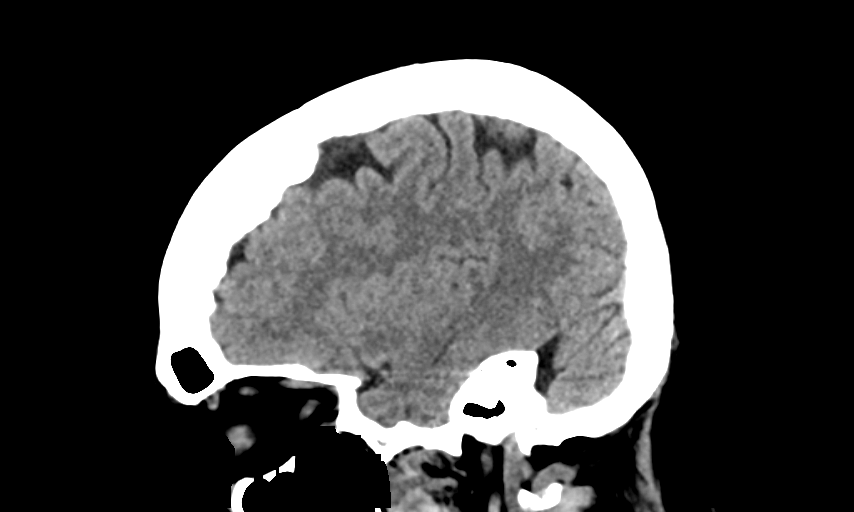

[Series 12: orthogonal axials · axial · 0.21mm/px · z∈[-270,-135]mm · 7 of 104 slices shown, 9 images]
[im 13/104  brain]
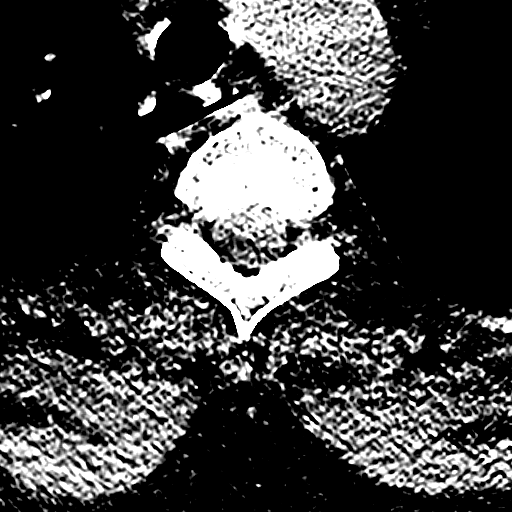
[im 13/104  bone]
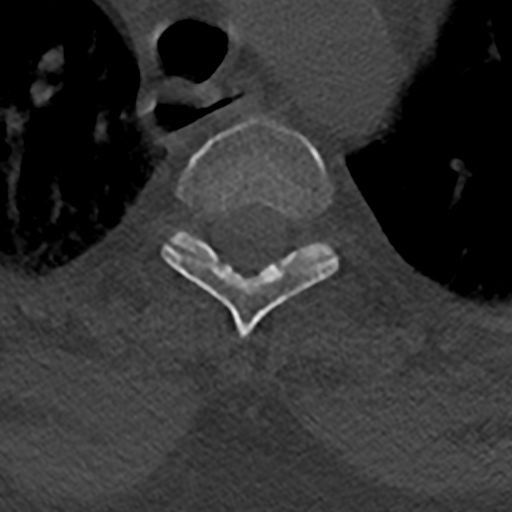
[im 26/104  brain]
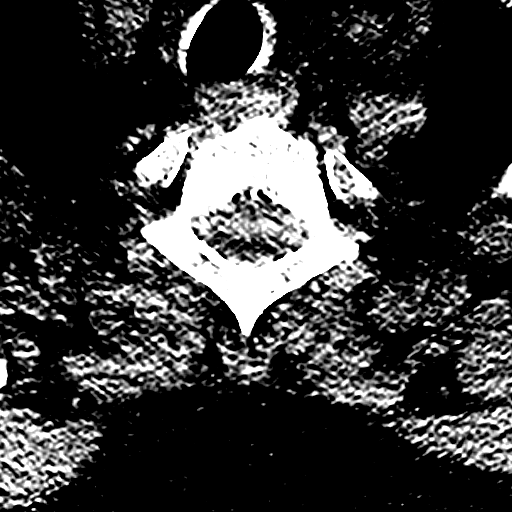
[im 39/104  brain]
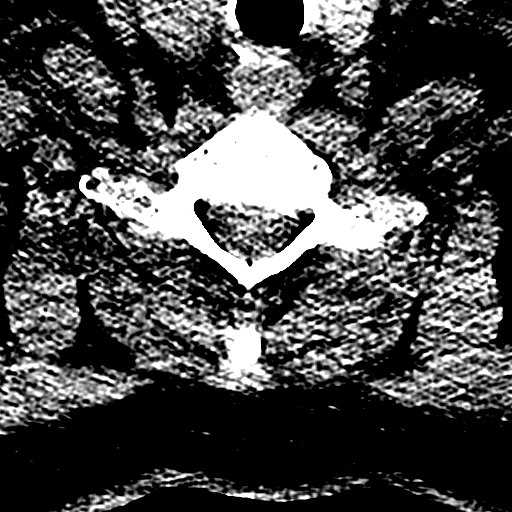
[im 52/104  brain]
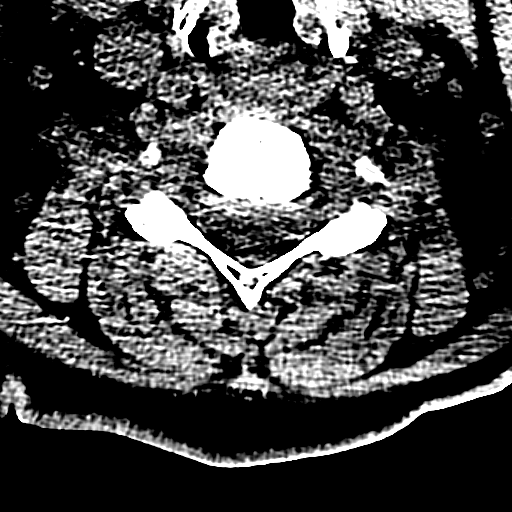
[im 65/104  brain]
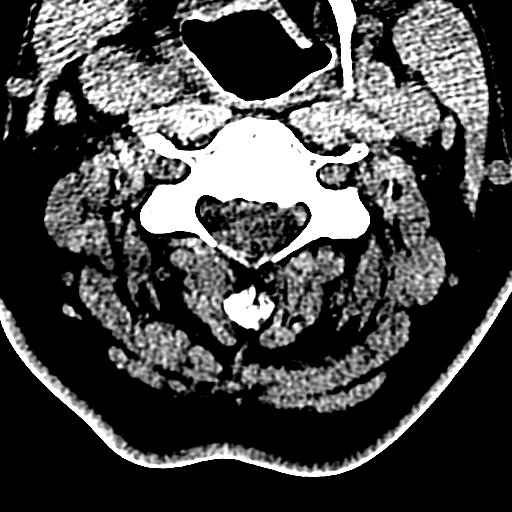
[im 65/104  bone]
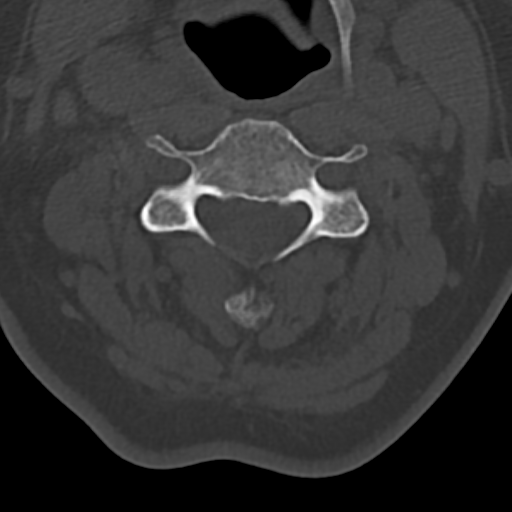
[im 78/104  brain]
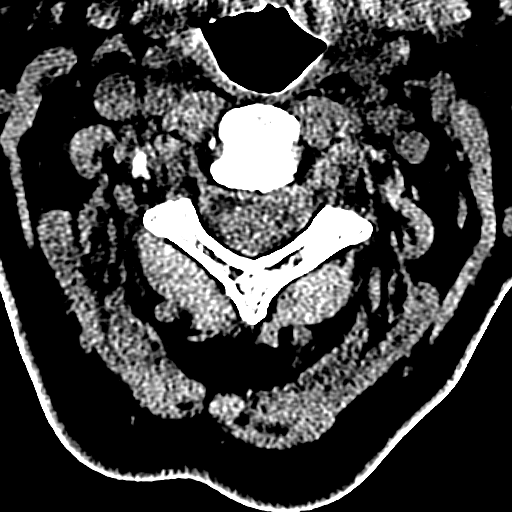
[im 91/104  brain]
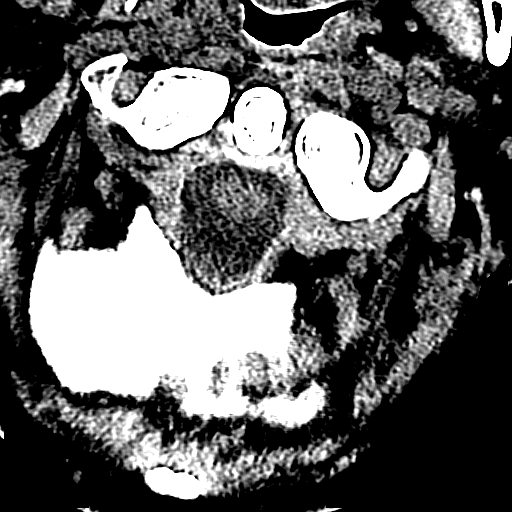

[16 of 47 positions shown; findings below may reference images not displayed]

FINDINGS: CT HEAD FINDINGS

Brain: No evidence of acute infarction, hemorrhage, hydrocephalus,
extra-axial collection or mass lesion/mass effect.

Vascular: Negative for hyperdense vessel

Skull: Negative

Sinuses/Orbits: Mild mucosal edema in the paranasal sinuses.
Bilateral lens replacement

Other: None

CT CERVICAL SPINE FINDINGS

Alignment: Normal

Skull base and vertebrae: Negative for fracture

Soft tissues and spinal canal: Negative

Disc levels: Mild cervical spine disc degeneration. Mild disc
degeneration and mild spurring C3-4 and C4-5

Upper chest: Negative

Other: None
IMPRESSION: Negative CT head

Negative for cervical spine fracture

## 2017-08-09 IMAGING — CR DG CHEST 2V
2 series · 2 of 2 positions shown · non-contrast
Comparison: Prior radiograph from [DATE].

CLINICAL DATA: Initial evaluation for acute fever, tachycardia,
fall.

EXAM:
CHEST  2 VIEW

[chest lat]
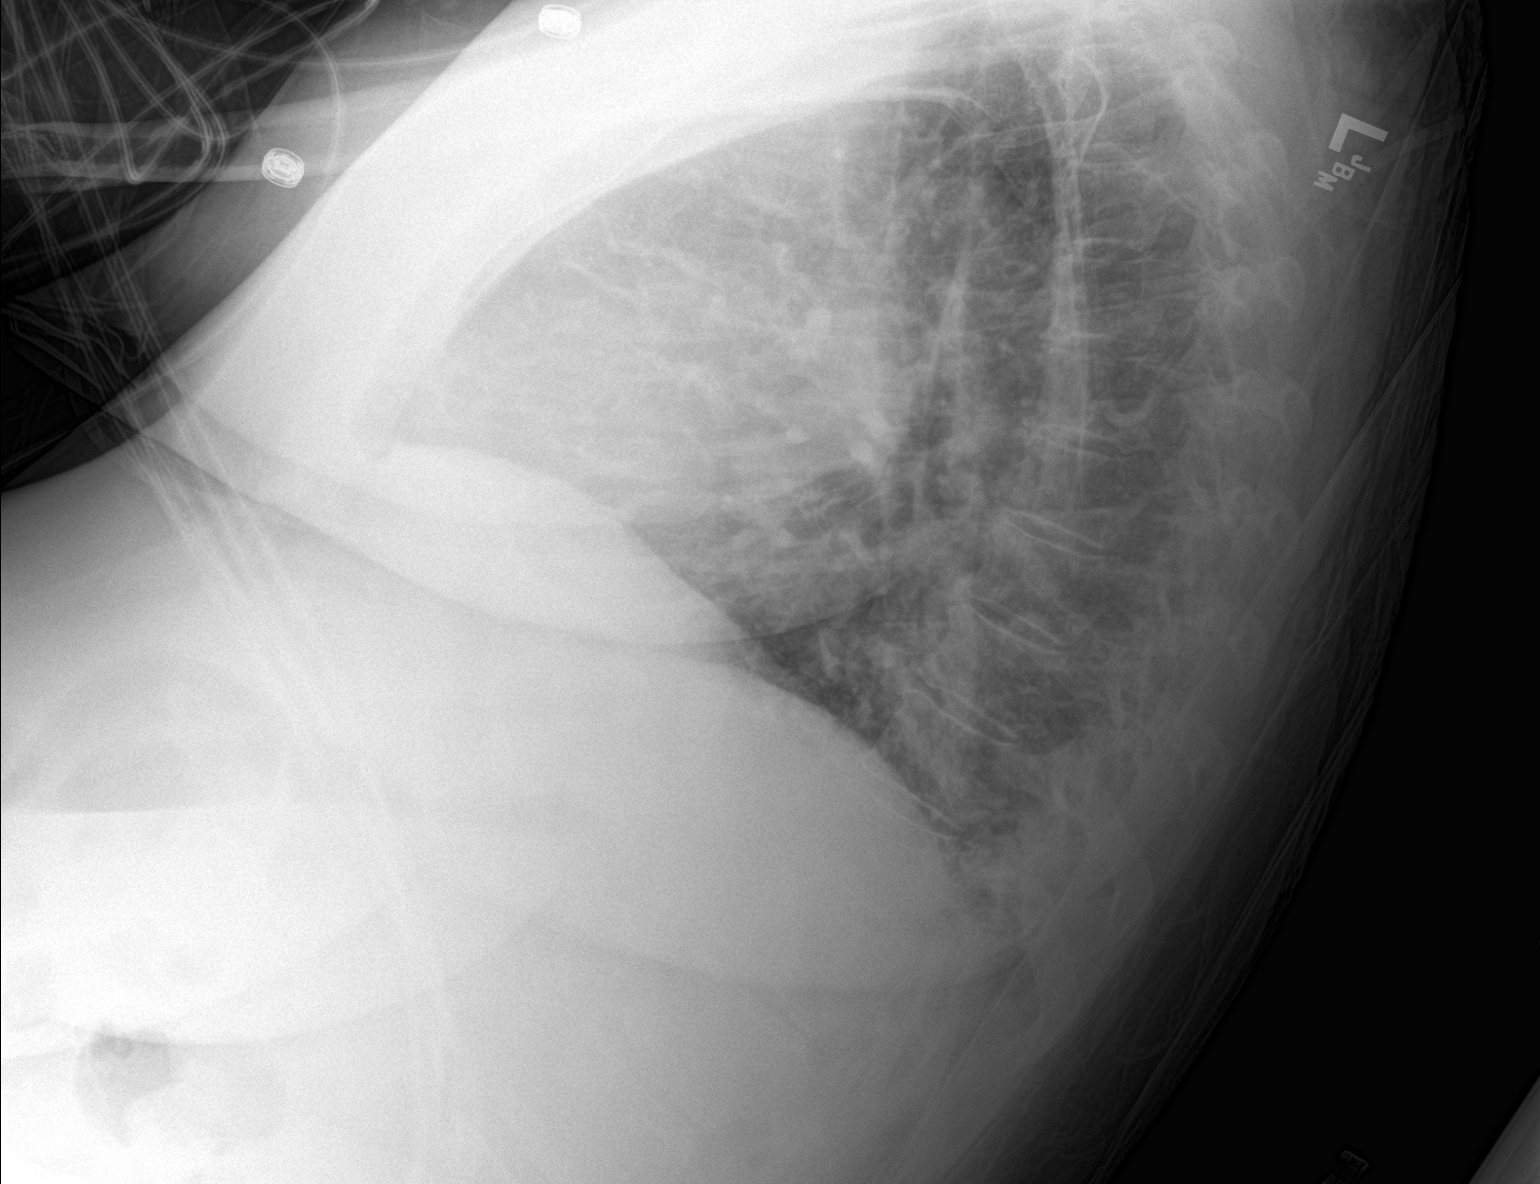

[chest ap]
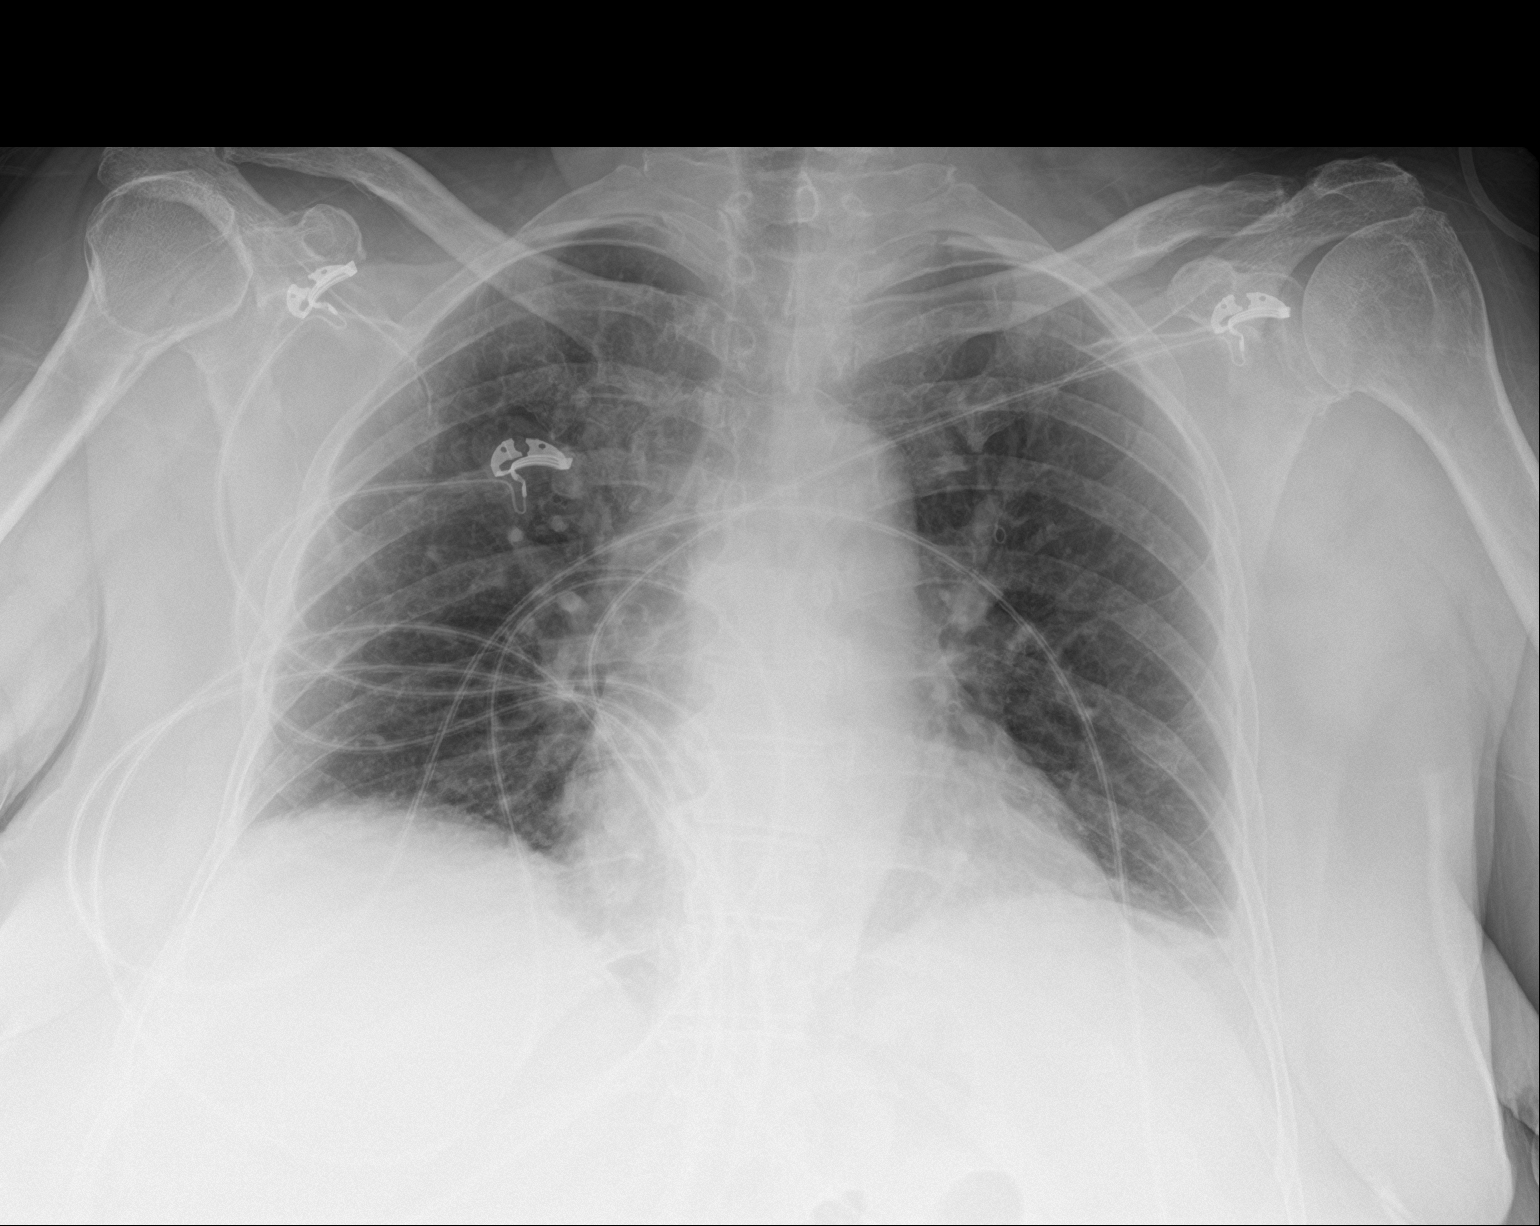

[2 of 2 positions shown; findings below may reference images not displayed]

FINDINGS: Cardiac and mediastinal silhouettes are within normal limits. Aortic
atherosclerosis.

Lungs hypoinflated. Mild bibasilar atelectasis. No focal
infiltrates. No pulmonary edema. Blunting of left costophrenic angle
suggestive of small left pleural effusion. No pneumothorax.

No acute osseus abnormality.
IMPRESSION: 1. Shallow lung inflation with mild bibasilar atelectasis.
2. Small left pleural effusion.
3. Aortic atherosclerosis.

## 2017-08-09 IMAGING — CR DG SHOULDER 2+V*R*
2 series · 2 of 2 positions shown · non-contrast
Comparison: None.

CLINICAL DATA: Initial evaluation for acute trauma, fall.

EXAM:
RIGHT SHOULDER - 2+ VIEW

[shoulder grashey]
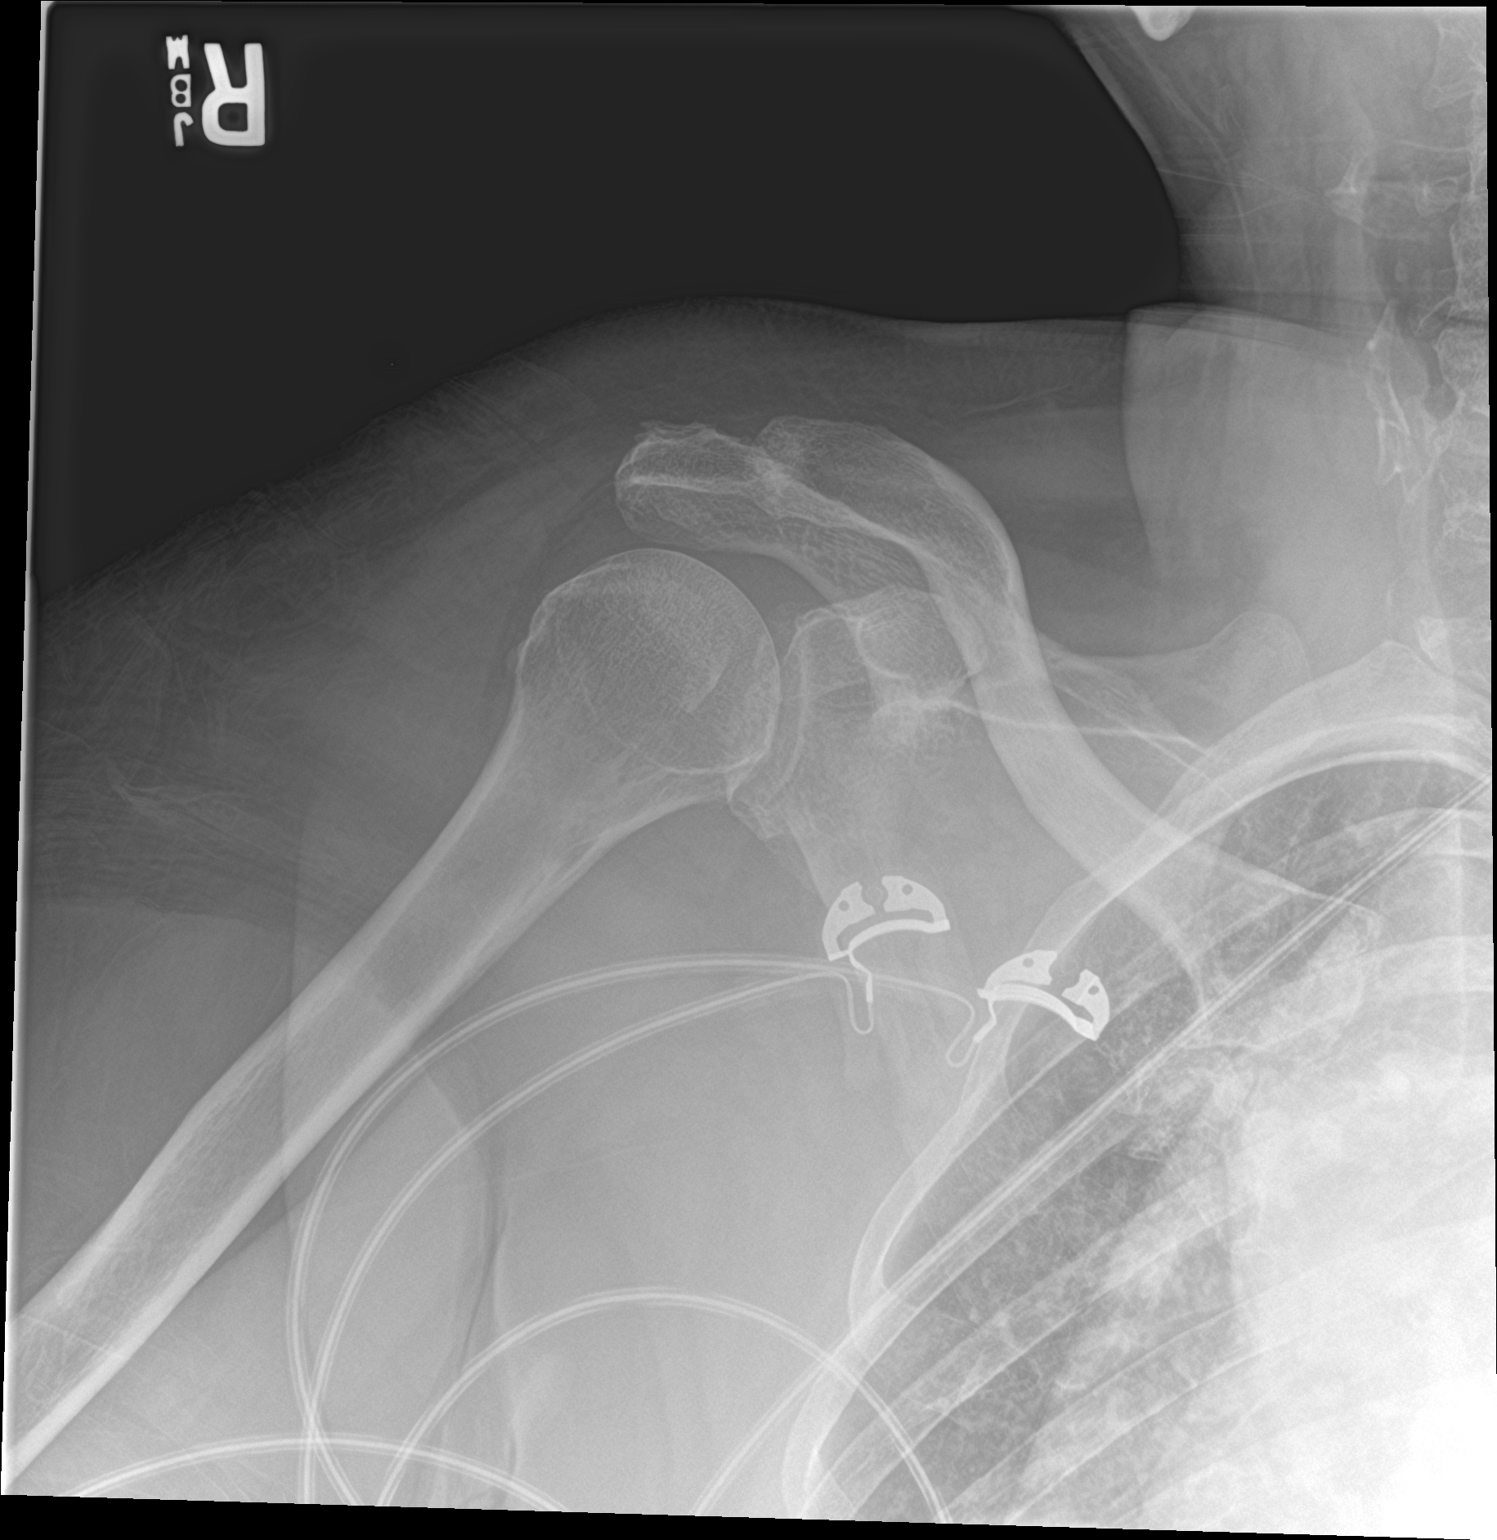

[shoulder y view]
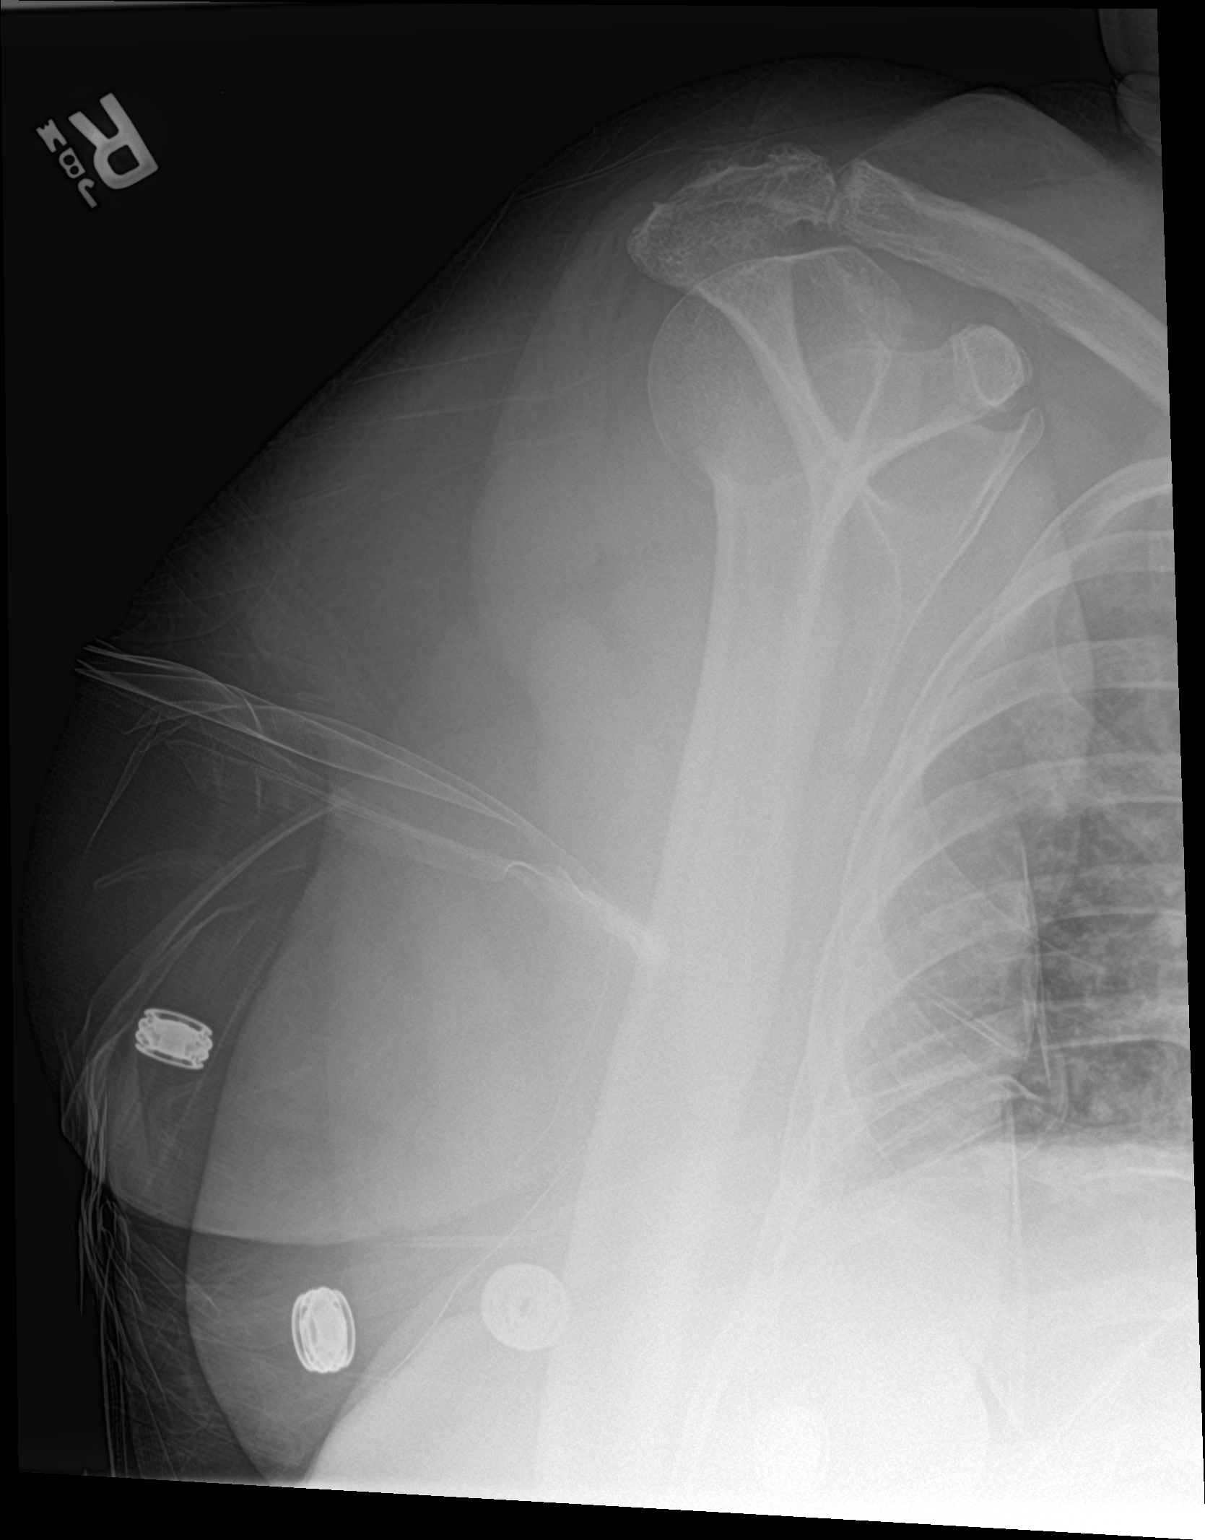

[2 of 2 positions shown; findings below may reference images not displayed]

FINDINGS: No acute fracture dislocation. Degenerative osteoarthritic changes
present about the right glenohumeral and acromioclavicular joints.
No periarticular calcification. No soft tissue abnormality.
Visualize right hemithorax grossly clear.
IMPRESSION: No acute osseous abnormality about the right shoulder.

## 2017-08-09 MED ORDER — PIPERACILLIN-TAZOBACTAM 3.375 G IVPB
3.3750 g | Freq: Three times a day (TID) | INTRAVENOUS | Status: DC
  Administered 2017-08-10 – 2017-08-11 (×5): 3.375 g via INTRAVENOUS
  Filled 2017-08-09 (×5): qty 50

## 2017-08-09 MED ORDER — SODIUM CHLORIDE 0.9 % IV BOLUS (SEPSIS)
1000.0000 mL | Freq: Once | INTRAVENOUS | Status: AC
  Administered 2017-08-09: 1000 mL via INTRAVENOUS

## 2017-08-09 MED ORDER — SODIUM CHLORIDE 0.9 % IV SOLN
1250.0000 mg | Freq: Two times a day (BID) | INTRAVENOUS | Status: DC
  Administered 2017-08-10 – 2017-08-11 (×3): 1250 mg via INTRAVENOUS
  Filled 2017-08-09 (×3): qty 1250

## 2017-08-09 MED ORDER — ACETAMINOPHEN 650 MG RE SUPP
650.0000 mg | Freq: Once | RECTAL | Status: AC
  Administered 2017-08-09: 650 mg via RECTAL
  Filled 2017-08-09: qty 1

## 2017-08-09 MED ORDER — SODIUM CHLORIDE 0.9 % IV SOLN
1500.0000 mg | Freq: Once | INTRAVENOUS | Status: AC
  Administered 2017-08-09: 1500 mg via INTRAVENOUS
  Filled 2017-08-09: qty 1500

## 2017-08-09 MED ORDER — PIPERACILLIN-TAZOBACTAM 3.375 G IVPB 30 MIN
3.3750 g | Freq: Once | INTRAVENOUS | Status: AC
  Administered 2017-08-09: 3.375 g via INTRAVENOUS
  Filled 2017-08-09: qty 50

## 2017-08-09 MED ORDER — VANCOMYCIN HCL IN DEXTROSE 1-5 GM/200ML-% IV SOLN
1000.0000 mg | Freq: Once | INTRAVENOUS | Status: DC
  Filled 2017-08-09: qty 200

## 2017-08-09 NOTE — ED Provider Notes (Signed)
MSE was initiated and I personally evaluated the patient and placed orders (if any) at  6:43 PM on August 09, 2017.  The patient appears stable so that the remainder of the MSE may be completed by another provider.  Patient presents following a fall. Reports mechanical fall. Denies syncope. Noted to be tachycardic and febrile. Initial low pressure recorded systolic in the 81V. However multiple repeat blood pressures are within normal range. Patient reports chills at home, no fevers. She reports dysuria. No cough. She is awake, alert, oriented. She has a hematoma to the forehead. Sepsis workup has already been initiated. Request rectal temperature and CT scan of the head. Initial lactate is normal. Given only 1 documented low blood pressure, will order just 1 L of fluids and have primary provider reassess for additional fluids. No antibiotics ordered at this time.   Merryl Hacker, MD 08/09/17 [phone]

## 2017-08-09 NOTE — ED Provider Notes (Signed)
Emergency Department Provider Note   I have reviewed the triage vital signs and the nursing notes.   HISTORY  Chief Complaint Fall   HPI Casey Sanders is a 56 y.o. female with PMH of DM, GERD, HLD, HTN, and anxiety presents emergency permit for evaluation of fever, confusion, several falls in the past 2 days. Patient has difficulty providing full history with some confusion. Patient's husband is at bedside states that the patient has had several falls out of bed over the past 2 days. He left for work this morning when the patient was in bed and returned home this evening to find the patient on the floor. He presumes that she's been on the floor all day. She has been complaining of some fever symptoms. Also complaining of right shoulder pain since the fall. Patient is not anticoagulated. No antibiotics or other medication changes. Patient denies chest pain or difficulty breathing at this time.  Level 5 caveat: Patient with confusion on arrival.   Past Medical History:  Diagnosis Date  . Allergic rhinitis   . Anxiety and depression   . Asthma   . Chest pain    a. Myoview 9/05: no ischemia, no scar, EF 70%  . Diverticulosis   . DM2 (diabetes mellitus, type 2) ([place])   . Fibromyalgia   . Frequent UTI   . GERD (gastroesophageal reflux disease)    Hiatal Hernia  . HLD (hyperlipidemia)   . HTN (hypertension)   . Left rotator cuff tear   . Lumbar disc disease   . Nephrolithiasis   . Obesity   . Palpitations    monitor 9/05: NSR, sinus tachy, PVCs    Patient Active Problem List   Diagnosis Date Noted  . Sepsis secondary to UTI ([place]) 08/09/2017  . Asthma 03/09/2017  . Vitamin D deficiency 03/09/2017  . GAD (generalized anxiety disorder) 05/23/2016  . GERD (gastroesophageal reflux disease) 05/23/2016  . Depression 05/23/2016  . Fibromyalgia 05/23/2016  . Family history of cardiac disorder 05/23/2016  . Cystitis 02/08/2015  . Diabetic neuropathy ([place]) 12/19/2013  . Rectal  bleeding 08/22/2013  . Diabetic neuropathy, type II diabetes mellitus ([place]) 03/17/2013  . Degenerative disc disease, lumbar 01/04/2013  . Pain syndrome, chronic 01/04/2013  . Low back pain 01/04/2013  . Facet arthropathy, lumbar 01/04/2013  . Extremity pain 01/04/2013  . Chest pain, unspecified 02/12/2011  . Syncope 02/12/2011  . Pre-operative cardiovascular examination 02/12/2011  . Diabetes mellitus ([place]) 01/04/2011  . Chronic pain 01/04/2011  . Diverticul disease small and large intestine, no perforati or abscess 01/04/2011  . Benign essential HTN 01/04/2011  . Hyperlipidemia 01/04/2011  . Restless leg syndrome 01/04/2011  . Cervical adenocarcinoma ([place]) 01/04/2011  . Palpitations 01/04/2011    Past Surgical History:  Procedure Laterality Date  . CESAREAN SECTION    . FOOT SURGERY    . right wrist surgery    . ROTATOR CUFF REPAIR Left 2009  . tonsillectomy    . TOTAL ABDOMINAL HYSTERECTOMY W/ BILATERAL SALPINGOOPHORECTOMY      Current Outpatient Rx  . Order #: [id number] Class: Normal  . Order #: [id number] Class: Normal  . Order #: [id number] Class: Historical Med  . Order #: [id number] Class: Normal  . Order #: [id number] Class: Historical Med  . Order #: [id number] Class: Historical Med  . Order #: [id number] Class: Normal  . Order #: [id number] Class: Historical Med  . Order #: [id number] Class: Historical Med  . Order #: [id number] Class: Historical Med  . Order #: [id number] Class: Normal  . Order #: [id number] Class:  Normal  . Order #: [id number] Class: Print  . Order #: [id number] Class: Normal  . Order #: [id number] Class: Historical Med  . Order #: [id number] Class: Normal  . Order #: [id number] Class: Normal  . Order #: [id number] Class: Normal  . Order #: [id number] Class: Normal    Allergies Codeine; Metformin and related; and Niaspan [niacin er]  Family History  Problem Relation Age of Onset  . Coronary artery disease Mother   . Coronary artery disease Father     Social History Social  History  Substance Use Topics  . Smoking status: Former Research scientist (life sciences)  . Smokeless tobacco: Never Used  . Alcohol use No    Review of Systems  Level 5 caveat: Confusion on arrival to the ED.   ____________________________________________   PHYSICAL EXAM:  VITAL SIGNS: ED Triage Vitals  Enc Vitals Group     BP 08/09/17 1814 (!) 63/46     Pulse Rate 08/09/17 1814 (!) 129     Resp 08/09/17 1814 18     Temp 08/09/17 1814 100.3 F (37.9 C)     Temp Source 08/09/17 1814 Oral     SpO2 08/09/17 1814 94 %     Weight 08/09/17 1814 220 lb (99.8 kg)     Height 08/09/17 1814 5\' 1"  (1.549 m)     Pain Score 08/09/17 1822 5   Constitutional: Slightly drowsy and confused on arrival. Making eye contact and participating in history with brief statements.  Eyes: Conjunctivae are normal.  Head: Slight bruising over the right lateral scalp.  Nose: No congestion/rhinnorhea. Mouth/Throat: Mucous membranes are dry.  Neck: No stridor.  Cardiovascular: Tachycardia. Good peripheral circulation. Grossly normal heart sounds.   Respiratory: Normal respiratory effort.  No retractions. Lungs CTAB. Gastrointestinal: Soft and nontender. No distention.  Musculoskeletal: No lower extremity tenderness nor edema. No gross deformities of extremities. Mild right shoulder pain.  Neurologic:  Normal speech and language. No gross focal neurologic deficits are appreciated.  Skin:  Skin is warm, dry and intact. No rash noted.  ____________________________________________   LABS (all labs ordered are listed, but only abnormal results are displayed)  Labs Reviewed  COMPREHENSIVE METABOLIC PANEL - Abnormal; Notable for the following:       Result Value   Sodium 127 (*)    Chloride 98 (*)    CO2 21 (*)    Glucose, Bld 258 (*)    BUN 24 (*)    Creatinine, Ser 1.09 (*)    Calcium 8.1 (*)    Albumin 3.2 (*)    AST 44 (*)    GFR calc non Af Amer 55 (*)    All other components within normal limits  CBC WITH  DIFFERENTIAL/PLATELET - Abnormal; Notable for the following:    Hemoglobin 11.6 (*)    HCT 34.3 (*)    Platelets 147 (*)    Lymphs Abs 0.6 (*)    All other components within normal limits  URINALYSIS, ROUTINE W REFLEX MICROSCOPIC - Abnormal; Notable for the following:    Color, Urine AMBER (*)    APPearance TURBID (*)    Glucose, UA >=500 (*)    Hgb urine dipstick SMALL (*)    Protein, ur 100 (*)    Leukocytes, UA MODERATE (*)    All other components within normal limits  CK - Abnormal; Notable for the following:    Total CK 401 (*)    All other components within normal limits  CBG MONITORING, ED - Abnormal; Notable for the following:  Glucose-Capillary 233 (*)    All other components within normal limits  CULTURE, BLOOD (ROUTINE X 2)  CULTURE, BLOOD (ROUTINE X 2)  PROTIME-INR  INFLUENZA PANEL BY PCR (TYPE A & B)  I-STAT CG4 LACTIC ACID, ED   ____________________________________________  RADIOLOGY  Dg Chest 2 View  Result Date: 08/09/2017 CLINICAL DATA:  Initial evaluation for acute fever, tachycardia, fall. EXAM: CHEST  2 VIEW COMPARISON:  Prior radiograph from 07/07/2013. FINDINGS: Cardiac and mediastinal silhouettes are within normal limits. Aortic atherosclerosis. Lungs hypoinflated. Mild bibasilar atelectasis. No focal infiltrates. No pulmonary edema. Blunting of left costophrenic angle suggestive of small left pleural effusion. No pneumothorax. No acute osseus abnormality. IMPRESSION: 1. Shallow lung inflation with mild bibasilar atelectasis. 2. Small left pleural effusion. 3. Aortic atherosclerosis. Electronically Signed   By: Jeannine Boga M.D.   On: 08/09/2017 21:06   Dg Shoulder Right  Result Date: 08/09/2017 CLINICAL DATA:  Initial evaluation for acute trauma, fall. EXAM: RIGHT SHOULDER - 2+ VIEW COMPARISON:  None. FINDINGS: No acute fracture dislocation. Degenerative osteoarthritic changes present about the right glenohumeral and acromioclavicular joints. No  periarticular calcification. No soft tissue abnormality. Visualize right hemithorax grossly clear. IMPRESSION: No acute osseous abnormality about the right shoulder. Electronically Signed   By: Jeannine Boga M.D.   On: 08/09/2017 21:08   Ct Head Wo Contrast  Result Date: 08/09/2017 CLINICAL DATA:  Recent falls EXAM: CT HEAD WITHOUT CONTRAST CT CERVICAL SPINE WITHOUT CONTRAST TECHNIQUE: Multidetector CT imaging of the head and cervical spine was performed following the standard protocol without intravenous contrast. Multiplanar CT image reconstructions of the cervical spine were also generated. COMPARISON:  None. FINDINGS: CT HEAD FINDINGS Brain: No evidence of acute infarction, hemorrhage, hydrocephalus, extra-axial collection or mass lesion/mass effect. Vascular: Negative for hyperdense vessel Skull: Negative Sinuses/Orbits: Mild mucosal edema in the paranasal sinuses. Bilateral lens replacement Other: None CT CERVICAL SPINE FINDINGS Alignment: Normal Skull base and vertebrae: Negative for fracture Soft tissues and spinal canal: Negative Disc levels: Mild cervical spine disc degeneration. Mild disc degeneration and mild spurring C3-4 and C4-5 Upper chest: Negative Other: None IMPRESSION: Negative CT head Negative for cervical spine fracture Electronically Signed   By: Franchot Gallo M.D.   On: 08/09/2017 20:56   Ct Cervical Spine Wo Contrast  Result Date: 08/09/2017 CLINICAL DATA:  Recent falls EXAM: CT HEAD WITHOUT CONTRAST CT CERVICAL SPINE WITHOUT CONTRAST TECHNIQUE: Multidetector CT imaging of the head and cervical spine was performed following the standard protocol without intravenous contrast. Multiplanar CT image reconstructions of the cervical spine were also generated. COMPARISON:  None. FINDINGS: CT HEAD FINDINGS Brain: No evidence of acute infarction, hemorrhage, hydrocephalus, extra-axial collection or mass lesion/mass effect. Vascular: Negative for hyperdense vessel Skull: Negative  Sinuses/Orbits: Mild mucosal edema in the paranasal sinuses. Bilateral lens replacement Other: None CT CERVICAL SPINE FINDINGS Alignment: Normal Skull base and vertebrae: Negative for fracture Soft tissues and spinal canal: Negative Disc levels: Mild cervical spine disc degeneration. Mild disc degeneration and mild spurring C3-4 and C4-5 Upper chest: Negative Other: None IMPRESSION: Negative CT head Negative for cervical spine fracture Electronically Signed   By: Franchot Gallo M.D.   On: 08/09/2017 20:56    ____________________________________________   PROCEDURES  Procedure(s) performed:   Procedures  CRITICAL CARE Performed by: Margette Fast Total critical care time: 35 minutes Critical care time was exclusive of separately billable procedures and treating other patients. Critical care was necessary to treat or prevent imminent or life-threatening deterioration. Critical care was  time spent personally by me on the following activities: development of treatment plan with patient and/or surrogate as well as nursing, discussions with consultants, evaluation of patient's response to treatment, examination of patient, obtaining history from patient or surrogate, ordering and performing treatments and interventions, ordering and review of laboratory studies, ordering and review of radiographic studies, pulse oximetry and re-evaluation of patient's condition.  Nanda Quinton, MD Emergency Medicine  ____________________________________________   INITIAL IMPRESSION / ASSESSMENT AND PLAN / ED COURSE  Pertinent labs & imaging results that were available during my care of the patient were reviewed by me and considered in my medical decision making (see chart for details).  Patient resents to the emergency department for evaluation after fall and being down the floor for most of the day. Patient's initial blood pressure was hypotensive and shortly afterwards we got several normal pressures. She does  have a fever with tachycardia and some tachypnea. Concern for developing systemic infection but initial lactate is negative. An for IV fluids, broad-spectrum antibiotics, infection workup. I have added a CT of the head and cervical spine along with plain films of the right shoulder with some pain in this area. Also added a CK with the patient being down the floor for possibly most of the day.  Patient with significant UTI on UA which is the likely source of fever. Normal initial lactate. Mental status slightly improving with IVF.   Discussed patient's case with Hospitalist to request admission. Patient and family (if present) updated with plan. Care transferred to Hospitalist service.  I reviewed all nursing notes, vitals, pertinent old records, EKGs, labs, imaging (as available).  ____________________________________________  FINAL CLINICAL IMPRESSION(S) / ED DIAGNOSES  Final diagnoses:  Acute cystitis without hematuria  Fall, initial encounter  Injury of head, initial encounter     MEDICATIONS GIVEN DURING THIS VISIT:  Medications  piperacillin-tazobactam (ZOSYN) IVPB 3.375 g (not administered)  vancomycin (VANCOCIN) 1,250 mg in sodium chloride 0.9 % 250 mL IVPB (not administered)  sodium chloride 0.9 % bolus 1,000 mL (0 mLs Intravenous Stopped 08/09/17 2039)  piperacillin-tazobactam (ZOSYN) IVPB 3.375 g (0 g Intravenous Stopped 08/09/17 1943)  vancomycin (VANCOCIN) 1,500 mg in sodium chloride 0.9 % 500 mL IVPB (0 mg Intravenous Stopped 08/09/17 2127)  acetaminophen (TYLENOL) suppository 650 mg (650 mg Rectal Given 08/09/17 2035)     NEW OUTPATIENT MEDICATIONS STARTED DURING THIS VISIT:  None  Note:  This document was prepared using Dragon voice recognition software and may include unintentional dictation errors.  Nanda Quinton, MD Emergency Medicine    Long, [facility], MD 08/10/17 [phone]

## 2017-08-09 NOTE — ED Notes (Signed)
EDP at bedside  

## 2017-08-09 NOTE — Progress Notes (Signed)
Pharmacy Antibiotic Note  Casey Sanders is a 56 y.o. female admitted on 08/09/2017 with sepsis.  Pharmacy has been consulted for Vancomycin / Zosyn dosing.  Plan: Zosyn 3.375 grams iv Q 8 hours Vancomycin 1500 mg iv x 1 now then 1250 mg iv Q 12 hours Follow up Scr, cultures, LOT  Height: 5\' 1"  (154.9 cm) Weight: 220 lb (99.8 kg) IBW/kg (Calculated) : 47.8  Temp (24hrs), Avg:101.6 F (38.7 C), Min:100.3 F (37.9 C), Max:102.9 F (39.4 C)   Recent Labs Lab 08/09/17 1830 08/09/17 1837  WBC 7.2  --   CREATININE 1.09*  --   LATICACIDVEN  --  1.11    Estimated Creatinine Clearance: 60.9 mL/min (A) (by C-G formula based on SCr of 1.09 mg/dL (H)).    Allergies  Allergen Reactions  . Codeine Other (See Comments), Hives and Nausea And Vomiting    unknown  . Metformin And Related     nausea  . Niaspan [Niacin Er] Other (See Comments)    Increase blood glucose    Thank you for allowing pharmacy to be a part of this patient's care. Anette Guarneri, PharmD [phone]  08/09/2017 7:27 PM

## 2017-08-09 NOTE — ED Notes (Signed)
Consulting MD at bedside

## 2017-08-09 NOTE — ED Notes (Signed)
Patient transported to CT 

## 2017-08-09 NOTE — ED Triage Notes (Signed)
Pt husband states that he found the patient on the floor today at home. She has had 5 falls over the past week. Had to be assisted out of vehicle, nurse first stated strong odor of urine. Pt spouse states she has "been foggy" over the past 3 days. C/o pain in the right shoulder, abrasion to the right head from todays fall. Pt says she has felt warm and had pain with urination

## 2017-08-10 ENCOUNTER — Encounter (HOSPITAL_COMMUNITY): Payer: Self-pay | Admitting: General Practice

## 2017-08-10 DIAGNOSIS — A419 Sepsis, unspecified organism: Secondary | ICD-10-CM

## 2017-08-10 DIAGNOSIS — N390 Urinary tract infection, site not specified: Secondary | ICD-10-CM

## 2017-08-10 LAB — CBC
HEMATOCRIT: 31.3 % — AB (ref 36.0–46.0)
Hemoglobin: 10.7 g/dL — ABNORMAL LOW (ref 12.0–15.0)
MCH: 28.2 pg (ref 26.0–34.0)
MCHC: 34.2 g/dL (ref 30.0–36.0)
MCV: 82.4 fL (ref 78.0–100.0)
Platelets: 117 10*3/uL — ABNORMAL LOW (ref 150–400)
RBC: 3.8 MIL/uL — AB (ref 3.87–5.11)
RDW: 14.5 % (ref 11.5–15.5)
WBC: 5.1 10*3/uL (ref 4.0–10.5)

## 2017-08-10 LAB — BASIC METABOLIC PANEL
ANION GAP: 9 (ref 5–15)
BUN: 18 mg/dL (ref 6–20)
CHLORIDE: 100 mmol/L — AB (ref 101–111)
CO2: 22 mmol/L (ref 22–32)
Calcium: 7.5 mg/dL — ABNORMAL LOW (ref 8.9–10.3)
Creatinine, Ser: 0.94 mg/dL (ref 0.44–1.00)
Glucose, Bld: 233 mg/dL — ABNORMAL HIGH (ref 65–99)
POTASSIUM: 3.5 mmol/L (ref 3.5–5.1)
SODIUM: 131 mmol/L — AB (ref 135–145)

## 2017-08-10 LAB — GLUCOSE, CAPILLARY
GLUCOSE-CAPILLARY: 208 mg/dL — AB (ref 65–99)
GLUCOSE-CAPILLARY: 193 mg/dL — AB (ref 65–99)
Glucose-Capillary: 153 mg/dL — ABNORMAL HIGH (ref 65–99)
Glucose-Capillary: 178 mg/dL — ABNORMAL HIGH (ref 65–99)
Glucose-Capillary: 83 mg/dL (ref 65–99)

## 2017-08-10 MED ORDER — INSULIN ASPART 100 UNIT/ML ~~LOC~~ SOLN
0.0000 [IU] | Freq: Three times a day (TID) | SUBCUTANEOUS | Status: DC
  Administered 2017-08-10: 5 [IU] via SUBCUTANEOUS
  Administered 2017-08-10 (×2): 3 [IU] via SUBCUTANEOUS
  Administered 2017-08-11: 2 [IU] via SUBCUTANEOUS
  Administered 2017-08-12: 5 [IU] via SUBCUTANEOUS
  Administered 2017-08-12: 3 [IU] via SUBCUTANEOUS

## 2017-08-10 MED ORDER — ATORVASTATIN CALCIUM 80 MG PO TABS
80.0000 mg | ORAL_TABLET | Freq: Every day | ORAL | Status: DC
  Administered 2017-08-10 – 2017-08-11 (×2): 80 mg via ORAL
  Filled 2017-08-10 (×2): qty 1

## 2017-08-10 MED ORDER — SODIUM CHLORIDE 0.9% FLUSH
3.0000 mL | Freq: Two times a day (BID) | INTRAVENOUS | Status: DC
  Administered 2017-08-10 – 2017-08-12 (×5): 3 mL via INTRAVENOUS

## 2017-08-10 MED ORDER — ALBUTEROL SULFATE (2.5 MG/3ML) 0.083% IN NEBU
3.0000 mL | INHALATION_SOLUTION | Freq: Four times a day (QID) | RESPIRATORY_TRACT | Status: DC | PRN
  Administered 2017-08-10: 3 mL via RESPIRATORY_TRACT
  Filled 2017-08-10: qty 3

## 2017-08-10 MED ORDER — SERTRALINE HCL 100 MG PO TABS
100.0000 mg | ORAL_TABLET | Freq: Two times a day (BID) | ORAL | Status: DC
  Administered 2017-08-10 – 2017-08-12 (×6): 100 mg via ORAL
  Filled 2017-08-10 (×6): qty 1

## 2017-08-10 MED ORDER — SODIUM CHLORIDE 0.9 % IV BOLUS (SEPSIS)
500.0000 mL | Freq: Once | INTRAVENOUS | Status: AC
  Administered 2017-08-10: 500 mL via INTRAVENOUS

## 2017-08-10 MED ORDER — ACETAMINOPHEN 325 MG PO TABS
650.0000 mg | ORAL_TABLET | Freq: Four times a day (QID) | ORAL | Status: DC | PRN
  Administered 2017-08-10 – 2017-08-12 (×3): 650 mg via ORAL
  Filled 2017-08-10 (×3): qty 2

## 2017-08-10 MED ORDER — ASPIRIN EC 81 MG PO TBEC
81.0000 mg | DELAYED_RELEASE_TABLET | Freq: Every day | ORAL | Status: DC
  Administered 2017-08-10 – 2017-08-12 (×3): 81 mg via ORAL
  Filled 2017-08-10 (×3): qty 1

## 2017-08-10 MED ORDER — DICLOFENAC SODIUM 1 % TD GEL
2.0000 g | Freq: Four times a day (QID) | TRANSDERMAL | Status: DC
  Administered 2017-08-10 – 2017-08-12 (×10): 2 g via TOPICAL
  Filled 2017-08-10: qty 100

## 2017-08-10 MED ORDER — AMITRIPTYLINE HCL 25 MG PO TABS
25.0000 mg | ORAL_TABLET | Freq: Every day | ORAL | Status: DC
  Administered 2017-08-10 – 2017-08-11 (×3): 25 mg via ORAL
  Filled 2017-08-10 (×3): qty 1

## 2017-08-10 MED ORDER — ENOXAPARIN SODIUM 40 MG/0.4ML ~~LOC~~ SOLN
40.0000 mg | SUBCUTANEOUS | Status: DC
  Administered 2017-08-10 – 2017-08-12 (×3): 40 mg via SUBCUTANEOUS
  Filled 2017-08-10 (×2): qty 0.4

## 2017-08-10 MED ORDER — SODIUM CHLORIDE 0.9 % IV SOLN
INTRAVENOUS | Status: AC
  Administered 2017-08-10: 02:00:00 via INTRAVENOUS

## 2017-08-10 MED ORDER — GABAPENTIN 400 MG PO CAPS
400.0000 mg | ORAL_CAPSULE | Freq: Four times a day (QID) | ORAL | Status: DC
  Administered 2017-08-10 – 2017-08-12 (×12): 400 mg via ORAL
  Filled 2017-08-10 (×12): qty 1

## 2017-08-10 NOTE — Evaluation (Signed)
Physical Therapy Evaluation Patient Details Name: Casey Sanders MRN: [id number] DOB: 27-Dec-1960 Today's Date: 08/10/2017   History of Present Illness  This is a 56 year old obese woman with diabetes, fibromyalgia, neuropathy, elevated cardiac risk, anxiety/depression who presents with her husband for frequent falls over the past 1-2 days. Severe sepsis, likely urinary source  Clinical Impression   Pt admitted with above diagnosis. Pt currently with functional limitations due to the deficits listed below (see PT Problem List). Overall moving well, minguard assist to and from bathroom using RW; of note: once back in chair after walking, pt became cold, shivering, and reported difficulty breathing; O2 sats 90-94%, HR elevated, 132 observed highest; reported she felt like she needed rescue inhaler; RN notified and started 2 L supplemental O2; REsp Therapy called;  Pt will benefit from skilled PT to increase their independence and safety with mobility to allow discharge to the venue listed below.       Follow Up Recommendations Home health PT    Equipment Recommendations  Rolling walker with 5" wheels;3in1 (PT)    Recommendations for Other Services       Precautions / Restrictions Precautions Precautions: Fall Precaution Comments: decr activity tolerance; watch O2 sats and wheezing      Mobility  Bed Mobility Overal bed mobility: Needs Assistance Bed Mobility: Rolling;Sidelying to Sit Rolling: Supervision Sidelying to sit: Min assist       General bed mobility comments: Cues for technqiue; min assist to elevate trunk to sit  Transfers Overall transfer level: Needs assistance Equipment used: Rolling walker (2 wheeled) Transfers: Sit to/from Stand Sit to Stand: Mod assist         General transfer comment: Light mod assist to power up; cues for hand placement and safety  Ambulation/Gait Ambulation/Gait assistance: Min guard Ambulation Distance (Feet): 20 Feet Assistive  device: Rolling walker (2 wheeled) Gait Pattern/deviations: Step-through pattern;Decreased step length - right;Decreased step length - left;Decreased stride length     General Gait Details: Cues to self-monitor for actvity tolerance  Stairs            Wheelchair Mobility    Modified Rankin (Stroke Patients Only)       Balance Overall balance assessment: Needs assistance           Standing balance-Leahy Scale: Poor (approaching Fair)                               Pertinent Vitals/Pain Pain Assessment: Faces Faces Pain Scale: Hurts little more Pain Location: Generalized fibromyalgia pain Pain Descriptors / Indicators: Aching Pain Intervention(s): Monitored during session    Home Living Family/patient expects to be discharged to:: Private residence Living Arrangements: Spouse/significant other Available Help at Discharge: Family;Available PRN/intermittently (works days) Type of Home: House Home Access: Stairs to enter   [employer] of Steps: 1 Home Layout: Two level;Able to live on main level with bedroom/bathroom Home Equipment: Gilford Rile - 2 wheels;Cane - single point;Bedside commode      Prior Function Level of Independence: Independent;Independent with assistive device(s)         Comments: RW as needed     Hand Dominance        Extremity/Trunk Assessment   Upper Extremity Assessment Upper Extremity Assessment: Generalized weakness    Lower Extremity Assessment Lower Extremity Assessment: Generalized weakness       Communication   Communication: No difficulties  Cognition Arousal/Alertness: Awake/alert Behavior During Therapy: WFL for tasks assessed/performed Overall  Cognitive Status: Within Functional Limits for tasks assessed                                        General Comments      Exercises     Assessment/Plan    PT Assessment Patient needs continued PT services  PT Problem List  Decreased strength;Decreased activity tolerance;Decreased balance;Decreased mobility;Decreased safety awareness;Decreased knowledge of precautions;Pain       PT Treatment Interventions DME instruction;Gait training;Stair training;Functional mobility training;Therapeutic activities;Therapeutic exercise;Balance training;Patient/family education    PT Goals (Current goals can be found in the Care Plan section)  Acute Rehab PT Goals Patient Stated Goal: less fibromyalgia pain PT Goal Formulation: With patient Time For Goal Achievement: 08/24/17 Potential to Achieve Goals: Good    Frequency Min 3X/week   Barriers to discharge        Co-evaluation               AM-PAC PT "6 Clicks" Daily Activity  Outcome Measure Difficulty turning over in bed (including adjusting bedclothes, sheets and blankets)?: A Little Difficulty moving from lying on back to sitting on the side of the bed? : A Lot Difficulty sitting down on and standing up from a chair with arms (e.g., wheelchair, bedside commode, etc,.)?: Unable Help needed moving to and from a bed to chair (including a wheelchair)?: A Little Help needed walking in hospital room?: A Little Help needed climbing 3-5 steps with a railing? : A Little 6 Click Score: 15    End of Session Equipment Utilized During Treatment: Gait belt Activity Tolerance: Patient tolerated treatment well Patient left: in chair;with call bell/phone within reach Nurse Communication: Mobility status;Other (comment) (Difficulty breathing) PT Visit Diagnosis: Unsteadiness on feet (R26.81);Other abnormalities of gait and mobility (R26.89);Repeated falls (R29.6);Muscle weakness (generalized) (M62.81)    Time: 7517-0017 PT Time Calculation (min) (ACUTE ONLY): 44 min   Charges:   PT Evaluation $PT Eval Moderate Complexity: 1 Mod PT Treatments $Gait Training: 8-22 mins $Therapeutic Activity: 8-22 mins   PT G Codes:        Roney Marion, PT  Acute  Rehabilitation Services Pager [phone] Office [phone]   Casey Sanders 08/10/2017, 12:25 PM

## 2017-08-10 NOTE — H&P (Signed)
History and Physical   Casey Sanders MWU:[id number] DOB: 1961-08-29 DOA: 08/09/2017  PCP: Eustaquio Maize, MD  Chief Complaint: Falls  HPI: This is a 56 year old obese woman with diabetes, fibromyalgia, neuropathy, elevated cardiac risk, anxiety/depression who presents with her husband for frequent falls over the past 1-2 days.  History is obtained from the patient as well as the husband.  She lives at home with her husband and grandson, typically walks with a 4 wheeled walker or a cane, chronically has some unsteadiness on her feet due to neuropathy, but over the past 1-2 days she is fallen multiple times from worsening balance issues/unsteadiness.  Associated symptoms include increased urinary frequency, new onset urinary incontinence, dysuria.  She denies any nausea vomiting or diarrhea.  No headaches.  She reports mild shortness of breath.  She reports she has had multiple UTIs in the past, she reports she was treated for one a little over a week ago she cannot recall that the antimicrobial agent that she took.  She is on an SGL T-2 inhibitor and other oral medications as well as a GLP-1 agonist.  She takes the maximum dose of gabapentin at 800 mg 4 times daily, she reports that she is noticed some jerking, her husband has not noticed this.  ED Course: In the emergency department she was febrile to 1-2.9 Fahrenheit, tachycardic to 129 that improved with treatment, respiratory rate is elevated to 33 breaths/min, she is hypotensive with a nadir blood pressure of 63/46 that improved to 112/69.  Diagnostic's obtained included a CT of the head and CT of the C-spine which did not reveal any acute findings.  Chest x-ray did not show any acute cardiopulmonary abnormality.  Labs remarkable for hyponatremia to 127, creatinine of 1.09, white count was within normal limits, urinalysis revealed glucose over 500, moderate leukocytes.  She was treated with broad-spectrum antimicrobial coverage including  piperacillin-tazobactam and vancomycin, as well as 1 L of isotonic fluids.  Hospital medicine was consulted for admission for sepsis secondary UTI.  Review of Systems: A complete ROS was obtained; pertinent positives negatives are denoted in the HPI. Otherwise, all systems are negative.   Past Medical History:  Diagnosis Date  . Allergic rhinitis   . Anxiety and depression   . Asthma   . Chest pain    a. Myoview 9/05: no ischemia, no scar, EF 70%  . Diverticulosis   . DM2 (diabetes mellitus, type 2) ([place])   . Fibromyalgia   . Frequent UTI   . GERD (gastroesophageal reflux disease)    Hiatal Hernia  . HLD (hyperlipidemia)   . HTN (hypertension)   . Left rotator cuff tear   . Lumbar disc disease   . Nephrolithiasis   . Obesity   . Palpitations    monitor 9/05: NSR, sinus tachy, PVCs   Social History   Social History  . Marital status: Married    Spouse name: N/A  . Number of children: 2  . Years of education: N/A   Occupational History  . disabled    Social History Main Topics  . Smoking status: Former Research scientist (life sciences)  . Smokeless tobacco: Never Used  . Alcohol use No  . Drug use: No  . Sexual activity: Not on file   Other Topics Concern  . Not on file   Social History Narrative  . No narrative on file   Family History  Problem Relation Age of Onset  . Coronary artery disease Mother   . Coronary artery disease Father  Physical Exam: Vitals:   08/09/17 2230 08/09/17 2245 08/09/17 2300 08/09/17 2345  BP: 109/64 112/64 (!) 114/55 112/69  Pulse: (!) 121 (!) 124 (!) 119 (!) 120  Resp: (!) 35 (!) 36 (!) 31 (!) 31  Temp:      TempSrc:      SpO2: 94% 92% 91%   Weight:      Height:       General: Appears calm and comfortable, white woman ENT: Grossly normal hearing, MMM. Cardiovascular: RRR. No M/R/G. No LE edema.  Respiratory: CTA bilaterally. No wheezes or crackles. Normal respiratory effort. Abdomen: Soft, non-tender. Bowel sounds present.  GU: Right CVA  tenderness with direct percussion Skin: No rash or induration seen on limited exam; had right knee ecchymoses and right scalp ecchymoses  Musculoskeletal: Grossly normal tone BUE/BLE. Appropriate ROM.  Psychiatric: Grossly normal mood and affect. Neurologic: Moves all extremities in coordinated fashion, oriented to year, but slow to answer questions  I have personally reviewed the following labs, culture data, and imaging studies.   Assessment/Plan:  Severe sepsis, likely from urinary source On initial presentation, BP of 63/46, febrile, tachycardic, increased RR; BP improved with fluids (severe sepsis given hypotension present on admission); symptoms including R CVA tenderness, urinary frequency, dysuria point to urinary source. Her SGLT2 inhibitor for DM increases risk for UTIs which she has had in the past; likely due to their effect on increasing degree of glucosuria. Plan: -add on urine culture -f/u blood cx -continue vancomycin + pip-tazo until more micro data results -hydrate with NS at 100 cc q h x 10 hrs  Other problems -Frequent falls: suspect multifactorial in nature- baseline debility / neuropathy with superimposed sepsis and dehydration leading to possible gabapentin toxicity (patient reports jerking which can be seen in setting of gabapentin toxicity) -Hyponatremia: suspect 2/2 to sepsis, continue to follow with IVF hydration -Fibromyalgia: causing chronic widespread musculoskeletal pain, continuing home amitriptyline, decreasing dose of gabapentin by 50%, continue topical NSAID gel -DM: holding home agents; placing on rapid acting correction factor, consider stopping SGLT2 inhibitor given severity of this urinary tract infection (unless another driver to her current illness is found) -Anxiety: continuing SSRI, holding benzodiazepine in setting of some cognitive dysfunction in setting of sepsis (slow in answering questions, emergency medicine team commented that she was altered  on admission)  DVT prophylaxis: Subq Lovenox Code Status: Full code after discussion on admission Disposition Plan: Anticipate D/C home in 2-5 days Consults called: none, but anticipate PT/OT need Admission status:  Admit to floor level of care, [facility]  Cheri Rous, MD [facility] [phone]  If 7PM-7AM, please contact night-coverage [email or web address] Password [email or web address]

## 2017-08-10 NOTE — Progress Notes (Signed)
Inpatient Diabetes Program Recommendations  AACE/ADA: New Consensus Statement on Inpatient Glycemic Control (2015)  Target Ranges:  Prepandial:   less than 140 mg/dL      Peak postprandial:   less than 180 mg/dL (1-2 hours)      Critically ill patients:  140 - 180 mg/dL   Lab Results  Component Value Date   GLUCAP 208 (H) 08/10/2017   HGBA1C 8.4 12/10/2015    Review of Glycemic ControlResults for Casey Sanders, Casey Sanders (MRN [id number]) as of 08/10/2017 10:35  Ref. Range 08/09/2017 18:22 08/10/2017 00:35 08/10/2017 08:03  Glucose-Capillary Latest Ref Range: 65 - 99 mg/dL 233 (H) 178 (H) 208 (H)   Diabetes history: Type 2 diabetes Outpatient Diabetes medications: Amaryl 4 mg tid with meals, Jardiance 25 mg daily, Trulicity 1.5 mg weekly Current orders for Inpatient glycemic control:  Novolog moderate tid with meals  Inpatient Diabetes Program Recommendations:   Please consider adding A1C to determine pre-hospitalization glycemic control. Also consider adding Lantus 20 units daily while in the hospital.    Thanks, Adah Perl, RN, BC-ADM Inpatient Diabetes Coordinator Pager [phone] (8a-5p)

## 2017-08-10 NOTE — Progress Notes (Signed)
  PROGRESS NOTE    Casey Sanders  CBS:[id number] DOB: Feb 17, 1961 DOA: 08/09/2017 PCP: Eustaquio Maize, MD   Chief Complaint  Patient presents with  . Fall    Brief Narrative:  HPI On 08/10/2017 by Dr. Cheri Rous This is a 56 year old obese woman with diabetes, fibromyalgia, neuropathy, elevated cardiac risk, anxiety/depression who presents with her husband for frequent falls over the past 1-2 days.  History is obtained from the patient as well as the husband. She lives at home with her husband and grandson, typically walks with a 4 wheeled walker or a cane, chronically has some unsteadiness on her feet due to neuropathy, but over the past 1-2 days she is fallen multiple times from worsening balance issues/unsteadiness.  Associated symptoms include increased urinary frequency, new onset urinary incontinence, dysuria.  She denies any nausea vomiting or diarrhea.  No headaches.  She reports mild shortness of breath.  She reports she has had multiple UTIs in the past, she reports she was treated for one a little over a week ago she cannot recall that the antimicrobial agent that she took. She is on an SGL T-2 inhibitor and other oral medications as well as a GLP-1 agonist.  She takes the maximum dose of gabapentin at 800 mg 4 times daily, she reports that she is noticed some jerking, her husband has not noticed this.  Assessment & Plan   Patient admitted earlier today by Dr. Stana Bunting, see H&P for details.   Severe sepsis secondary to possible UTI -Presented with fever, tachycardia, tachypnea, hypotension (which improved with fluids) -UA: TNTC WBC, no bacteria, moderate leukocytes, turbid appearance -urine cutlure pending  -blood cultures show no growth -influenza PCR negative -continue Vanc and zosyn, IVF   Frequent falls -possibly multifactorial: deconditioning, possible UTI, neuropathy -PT consulted  Hypertension -patient with hypotension on admission, lisinopril  held  Hyponatremia -possible due to dehydration -Continue IVF and monitor BMP  Fibromyalgia -continue amitriptyline   Diabetes mellitus, type II with neuropathy -continue insulin sliding scale  -Glimepiride, Trulcity, and jardiance held  -continue gabapentin for neuopathy  Anxiety/Depression -Cotninue zoloft  DVT Prophylaxis  Lovenox  Code Status: Full  Family Communication: None at bedside  Disposition Plan: Admitted, continue IV antibiotics  Consultants None  Procedures  None   LOS: 1 day   Time Spent in minutes   30 minutes  Domanik Rainville D.O. on 08/10/2017 at 11:18 AM  Between 7am to 7pm - Pager - [phone]  After 7pm go to [email or web address] - password [email or web address]  And look for the night coverage person covering for me after hours  [facility] Office  [phone]

## 2017-08-11 LAB — BLOOD CULTURE ID PANEL (REFLEXED)
Acinetobacter baumannii: NOT DETECTED
CANDIDA GLABRATA: NOT DETECTED
CANDIDA KRUSEI: NOT DETECTED
CANDIDA PARAPSILOSIS: NOT DETECTED
CARBAPENEM RESISTANCE: NOT DETECTED
Candida albicans: NOT DETECTED
Candida tropicalis: NOT DETECTED
ESCHERICHIA COLI: DETECTED — AB
Enterobacter cloacae complex: NOT DETECTED
Enterobacteriaceae species: DETECTED — AB
Enterococcus species: NOT DETECTED
Haemophilus influenzae: NOT DETECTED
KLEBSIELLA OXYTOCA: NOT DETECTED
KLEBSIELLA PNEUMONIAE: NOT DETECTED
Listeria monocytogenes: NOT DETECTED
Neisseria meningitidis: NOT DETECTED
Proteus species: NOT DETECTED
Pseudomonas aeruginosa: NOT DETECTED
SERRATIA MARCESCENS: NOT DETECTED
STAPHYLOCOCCUS AUREUS BCID: NOT DETECTED
STAPHYLOCOCCUS SPECIES: NOT DETECTED
STREPTOCOCCUS PNEUMONIAE: NOT DETECTED
STREPTOCOCCUS PYOGENES: NOT DETECTED
Streptococcus agalactiae: NOT DETECTED
Streptococcus species: NOT DETECTED

## 2017-08-11 LAB — CBC
HEMATOCRIT: 29.5 % — AB (ref 36.0–46.0)
Hemoglobin: 9.5 g/dL — ABNORMAL LOW (ref 12.0–15.0)
MCH: 26.8 pg (ref 26.0–34.0)
MCHC: 32.2 g/dL (ref 30.0–36.0)
MCV: 83.1 fL (ref 78.0–100.0)
Platelets: 120 10*3/uL — ABNORMAL LOW (ref 150–400)
RBC: 3.55 MIL/uL — ABNORMAL LOW (ref 3.87–5.11)
RDW: 14.7 % (ref 11.5–15.5)
WBC: 4.5 10*3/uL (ref 4.0–10.5)

## 2017-08-11 LAB — BASIC METABOLIC PANEL
Anion gap: 7 (ref 5–15)
BUN: 11 mg/dL (ref 6–20)
CALCIUM: 7.7 mg/dL — AB (ref 8.9–10.3)
CHLORIDE: 105 mmol/L (ref 101–111)
CO2: 23 mmol/L (ref 22–32)
CREATININE: 0.79 mg/dL (ref 0.44–1.00)
GFR calc Af Amer: >60 mL/min (ref >60)
GFR calc non Af Amer: >60 mL/min (ref >60)
GLUCOSE: 78 mg/dL (ref 65–99)
Potassium: 3.5 mmol/L (ref 3.5–5.1)
Sodium: 135 mmol/L (ref 135–145)

## 2017-08-11 LAB — GLUCOSE, CAPILLARY
GLUCOSE-CAPILLARY: 132 mg/dL — AB (ref 65–99)
GLUCOSE-CAPILLARY: 118 mg/dL — AB (ref 65–99)
GLUCOSE-CAPILLARY: 152 mg/dL — AB (ref 65–99)
Glucose-Capillary: 75 mg/dL (ref 65–99)

## 2017-08-11 LAB — VITAMIN D 25 HYDROXY (VIT D DEFICIENCY, FRACTURES): VIT D 25 HYDROXY: 10.6 ng/mL — AB (ref 30.0–100.0)

## 2017-08-11 MED ORDER — VITAMIN D (ERGOCALCIFEROL) 1.25 MG (50000 UNIT) PO CAPS
50000.0000 [IU] | ORAL_CAPSULE | ORAL | Status: DC
  Administered 2017-08-11: 50000 [IU] via ORAL
  Filled 2017-08-11: qty 1

## 2017-08-11 MED ORDER — DEXTROSE 5 % IV SOLN
2.0000 g | INTRAVENOUS | Status: AC
  Administered 2017-08-11 – 2017-08-12 (×2): 2 g via INTRAVENOUS
  Filled 2017-08-11 (×2): qty 2

## 2017-08-11 NOTE — Progress Notes (Signed)
  PHARMACY - PHYSICIAN COMMUNICATION CRITICAL VALUE ALERT - BLOOD CULTURE IDENTIFICATION (BCID)  Results for orders placed or performed during the hospital encounter of 08/09/17  Blood Culture ID Panel (Reflexed) (Collected: 08/09/2017  6:48 PM)  Result Value Ref Range   Enterococcus species NOT DETECTED NOT DETECTED   Listeria monocytogenes NOT DETECTED NOT DETECTED   Staphylococcus species NOT DETECTED NOT DETECTED   Staphylococcus aureus NOT DETECTED NOT DETECTED   Streptococcus species NOT DETECTED NOT DETECTED   Streptococcus agalactiae NOT DETECTED NOT DETECTED   Streptococcus pneumoniae NOT DETECTED NOT DETECTED   Streptococcus pyogenes NOT DETECTED NOT DETECTED   Acinetobacter baumannii NOT DETECTED NOT DETECTED   Enterobacteriaceae species DETECTED (A) NOT DETECTED   Enterobacter cloacae complex NOT DETECTED NOT DETECTED   Escherichia coli DETECTED (A) NOT DETECTED   Klebsiella oxytoca NOT DETECTED NOT DETECTED   Klebsiella pneumoniae NOT DETECTED NOT DETECTED   Proteus species NOT DETECTED NOT DETECTED   Serratia marcescens NOT DETECTED NOT DETECTED   Carbapenem resistance NOT DETECTED NOT DETECTED   Haemophilus influenzae NOT DETECTED NOT DETECTED   Neisseria meningitidis NOT DETECTED NOT DETECTED   Pseudomonas aeruginosa NOT DETECTED NOT DETECTED   Candida albicans NOT DETECTED NOT DETECTED   Candida glabrata NOT DETECTED NOT DETECTED   Candida krusei NOT DETECTED NOT DETECTED   Candida parapsilosis NOT DETECTED NOT DETECTED   Candida tropicalis NOT DETECTED NOT DETECTED    Name of physician (or Provider) Contacted: Dr. Ree Kida  Changes to prescribed antibiotics required: D/c Vancomycin + Zosyn and narrow to Rocephin 2g IV every 24 hours  Alycia Rossetti, PharmD, BCPS Pager: [phone] 10:39 AM

## 2017-08-11 NOTE — Progress Notes (Signed)
Patient refused to have bed alarm be turned on. "it's been off all day long". Explained to patient the reason why the alarm was turned on,patient verbalized understanding but adamantly refused it to be on. Advised to call staff before getting up. Call bell within reach. Doreatha Offer, Wonda Cheng, Therapist, sports

## 2017-08-11 NOTE — Progress Notes (Signed)
PROGRESS NOTE    Casey Sanders  FYB:[id number] DOB: 30-May-1961 DOA: 08/09/2017 PCP: Eustaquio Maize, MD   Chief Complaint  Patient presents with  . Fall    Brief Narrative:  HPI On 08/10/2017 by Dr. Cheri Rous This is a 56 year old obese woman with diabetes, fibromyalgia, neuropathy, elevated cardiac risk, anxiety/depression who presents with her husband for frequent falls over the past 1-2 days. History is obtained from the patient as well as the husband. She lives at home with her husband and grandson, typically walks with a 4 wheeled walker or a cane, chronically has some unsteadiness on her feet due to neuropathy, but over the past 1-2 days she is fallen multiple times from worsening balance issues/unsteadiness. Associated symptoms include increased urinary frequency, new onset urinary incontinence, dysuria. She denies any nausea vomiting or diarrhea. No headaches. She reports mild shortness of breath. She reports she has had multiple UTIs in the past, she reports she was treated for one a little over a week ago she cannot recall that the antimicrobial agent that she took. She is on an SGL T-2 inhibitor and other oral medications as well as a GLP-1 agonist. She takes the maximum dose of gabapentin at 800 mg 4 times daily, she reports that she is noticed some jerking, her husband has not noticed this.  Assessment & Plan   Severe sepsis secondary to [facility] UTI/Bacteremia -Presented with fever, tachycardia, tachypnea, hypotension (which improved with fluids) -UA: TNTC WBC, no bacteria, moderate leukocytes, turbid appearance -Urine culture >100K Ecoli -Blood cultures 1/2 Ecoli, Enterobacteriaceae species -influenza PCR negative -was initially placed on vanc/zosy, transitioned to ceftriaxone -will repeat blood cultures   Frequent falls/Generalized weakness -possibly multifactorial: deconditioning, possible UTI, neuropathy, Vit D def -PT consulted; recommended HH, 3in1, and  walker -Case management consulted  Hypertension -patient with hypotension on admission, lisinopril held  Hyponatremia -improving, possible due to dehydration -Continue IVF and monitor BMP  Fibromyalgia -continue amitriptyline   Diabetes mellitus, type II with neuropathy -continue insulin sliding scale  -Glimepiride, Trulcity, and jardiance held  -continue gabapentin for neuopathy  Vitamin D deficiency -Vit D 25-hydroxy: 10.6 -will start on Vit D 50K units weekly  Anxiety/Depression -Cotninue zoloft  DVT Prophylaxis  Lovenox  Code Status: Full  Family Communication: None at bedside  Disposition Plan: Admitted, Continue IV antibiotics  Consultants None  Procedures  None  Antibiotics   Anti-infectives    Start     Dose/Rate Route Frequency Ordered Stop   08/11/17 1600  cefTRIAXone (ROCEPHIN) 2 g in dextrose 5 % 50 mL IVPB     2 g 100 mL/hr over 30 Minutes Intravenous Every 24 hours 08/11/17 1039     08/10/17 0800  vancomycin (VANCOCIN) 1,250 mg in sodium chloride 0.9 % 250 mL IVPB  Status:  Discontinued     1,250 mg 166.7 mL/hr over 90 Minutes Intravenous Every 12 hours 08/09/17 1929 08/11/17 1039   08/10/17 0200  piperacillin-tazobactam (ZOSYN) IVPB 3.375 g  Status:  Discontinued     3.375 g 12.5 mL/hr over 240 Minutes Intravenous Every 8 hours 08/09/17 1929 08/11/17 1039   08/09/17 1915  piperacillin-tazobactam (ZOSYN) IVPB 3.375 g     3.375 g 100 mL/hr over 30 Minutes Intravenous  Once 08/09/17 1904 08/09/17 1943   08/09/17 1915  vancomycin (VANCOCIN) IVPB 1000 mg/200 mL premix  Status:  Discontinued     1,000 mg 200 mL/hr over 60 Minutes Intravenous  Once 08/09/17 1904 08/09/17 1908   08/09/17 1915  vancomycin (VANCOCIN)  1,500 mg in sodium chloride 0.9 % 500 mL IVPB     1,500 mg 250 mL/hr over 120 Minutes Intravenous  Once 08/09/17 1908 08/09/17 2127      Subjective:   Casey Sanders seen and examined today.  Patient states she is feeling better  but still feels weak. Denies chest pain, shortness of breath, abdominal pain, nausea, vomiting, dizziness, headache.   Objective:   Vitals:   08/10/17 1752 08/10/17 2100 08/11/17 0649 08/11/17 0947  BP: (!) 115/57 97/68 (!) 110/53 127/77  Pulse: (!) 110 (!) 112 (!) 101 (!) 115  Resp: (!) 21 (!) 22 20 20   Temp: 99.7 F (37.6 C) (!) 102.5 F (39.2 C) 98.2 F (36.8 C) 98.1 F (36.7 C)  TempSrc: Oral Oral Oral Oral  SpO2: 93% 93% 95% 94%  Weight:  98.4 kg (216 lb 14.9 oz)    Height:        Intake/Output Summary (Last 24 hours) at 08/11/17 1113 Last data filed at 08/11/17 0947  Gross per 24 hour  Intake              743 ml  Output                0 ml  Net              743 ml   Filed Weights   08/09/17 1814 08/10/17 0050 08/10/17 2100  Weight: 99.8 kg (220 lb) 98.2 kg (216 lb 7.9 oz) 98.4 kg (216 lb 14.9 oz)    Exam  General: Well developed, well nourished, NAD, appears stated age  HEENT: NCAT, mucous membranes moist.   Cardiovascular: S1 S2 auscultated, RRR, no murmurs  Respiratory: Clear to auscultation bilaterally with equal chest rise  Abdomen: Soft, obese, nontender, nondistended, + bowel sounds  Extremities: warm dry without cyanosis clubbing or edema  Neuro: AAOx3, nonfocal  Psych: Flat affect, but appropriate   Data Reviewed: I have personally reviewed following labs and imaging studies  CBC:  Recent Labs Lab 08/09/17 1830 08/10/17 0412 08/11/17 0748  WBC 7.2 5.1 4.5  NEUTROABS 6.1  --   --   HGB 11.6* 10.7* 9.5*  HCT 34.3* 31.3* 29.5*  MCV 81.5 82.4 83.1  PLT 147* 117* 009*   Basic Metabolic Panel:  Recent Labs Lab 08/09/17 1830 08/10/17 0412 08/11/17 0748  NA 127* 131* 135  K 3.5 3.5 3.5  CL 98* 100* 105  CO2 21* 22 23  GLUCOSE 258* 233* 78  BUN 24* 18 11  CREATININE 1.09* 0.94 0.79  CALCIUM 8.1* 7.5* 7.7*   GFR: Estimated Creatinine Clearance: 82.3 mL/min (by C-G formula based on SCr of 0.79 mg/dL). Liver Function  Tests:  Recent Labs Lab 08/09/17 1830  AST 44*  ALT 40  ALKPHOS 50  BILITOT 1.2  PROT 7.0  ALBUMIN 3.2*   No results for input(s): LIPASE, AMYLASE in the last 168 hours. No results for input(s): AMMONIA in the last 168 hours. Coagulation Profile:  Recent Labs Lab 08/09/17 1830  INR 1.21   Cardiac Enzymes:  Recent Labs Lab 08/09/17 1903  CKTOTAL 401*   BNP (last 3 results) No results for input(s): PROBNP in the last 8760 hours. HbA1C: No results for input(s): HGBA1C in the last 72 hours. CBG:  Recent Labs Lab 08/10/17 0803 08/10/17 1241 08/10/17 1725 08/10/17 2211 08/11/17 0741  GLUCAP 208* 193* 153* 83 75   Lipid Profile: No results for input(s): CHOL, HDL, LDLCALC, TRIG, CHOLHDL, LDLDIRECT in the last  72 hours. Thyroid Function Tests: No results for input(s): TSH, T4TOTAL, FREET4, T3FREE, THYROIDAB in the last 72 hours. Anemia Panel: No results for input(s): VITAMINB12, FOLATE, FERRITIN, TIBC, IRON, RETICCTPCT in the last 72 hours. Urine analysis:    Component Value Date/Time   COLORURINE AMBER (A) 08/09/2017 1902   APPEARANCEUR TURBID (A) 08/09/2017 1902   LABSPEC 1.013 08/09/2017 1902   PHURINE 5.0 08/09/2017 1902   GLUCOSEU >=500 (A) 08/09/2017 1902   HGBUR SMALL (A) 08/09/2017 1902   BILIRUBINUR NEGATIVE 08/09/2017 1902   BILIRUBINUR neg 12/10/2015 1024   KETONESUR NEGATIVE 08/09/2017 1902   PROTEINUR 100 (A) 08/09/2017 1902   UROBILINOGEN negative 12/10/2015 1024   UROBILINOGEN 1.0 07/07/2013 1143   NITRITE NEGATIVE 08/09/2017 1902   LEUKOCYTESUR MODERATE (A) 08/09/2017 1902   Sepsis Labs: @LABRCNTIP (procalcitonin:4,lacticidven:4)  ) Recent Results (from the past 240 hour(s))  Culture, blood (Routine x 2)     Status: None (Preliminary result)   Collection Time: 08/09/17  6:30 PM  Result Value Ref Range Status   Specimen Description BLOOD RIGHT ANTECUBITAL  Final   Special Requests   Final    BOTTLES DRAWN AEROBIC AND ANAEROBIC Blood  Culture adequate volume   Culture NO GROWTH < 24 HOURS  Final   Report Status PENDING  Incomplete  Culture, blood (Routine x 2)     Status: None (Preliminary result)   Collection Time: 08/09/17  6:48 PM  Result Value Ref Range Status   Specimen Description BLOOD LEFT ANTECUBITAL  Final   Special Requests   Final    BOTTLES DRAWN AEROBIC AND ANAEROBIC Blood Culture adequate volume   Culture  Setup Time   Final    GRAM NEGATIVE RODS AEROBIC BOTTLE ONLY Organism ID to follow CRITICAL RESULT CALLED TO, READ BACK BY AND VERIFIED WITH: Alvino Chapel.D. 10:35 08/11/17 ([email or web address])    Culture GRAM NEGATIVE RODS  Final   Report Status PENDING  Incomplete  Blood Culture ID Panel (Reflexed)     Status: Abnormal   Collection Time: 08/09/17  6:48 PM  Result Value Ref Range Status   Enterococcus species NOT DETECTED NOT DETECTED Final   Listeria monocytogenes NOT DETECTED NOT DETECTED Final   Staphylococcus species NOT DETECTED NOT DETECTED Final   Staphylococcus aureus NOT DETECTED NOT DETECTED Final   Streptococcus species NOT DETECTED NOT DETECTED Final   Streptococcus agalactiae NOT DETECTED NOT DETECTED Final   Streptococcus pneumoniae NOT DETECTED NOT DETECTED Final   Streptococcus pyogenes NOT DETECTED NOT DETECTED Final   Acinetobacter baumannii NOT DETECTED NOT DETECTED Final   Enterobacteriaceae species DETECTED (A) NOT DETECTED Final    Comment: Enterobacteriaceae represent a large family of gram-negative bacteria, not a single organism. CRITICAL RESULT CALLED TO, READ BACK BY AND VERIFIED WITH: Alvino Chapel.D. 10:35 08/11/17 ([email or web address])    Enterobacter cloacae complex NOT DETECTED NOT DETECTED Final   Escherichia coli DETECTED (A) NOT DETECTED Final    Comment: CRITICAL RESULT CALLED TO, READ BACK BY AND VERIFIED WITH: EDaisy Lazar.D. 10:35 08/11/17 ([email or web address])    Klebsiella oxytoca NOT DETECTED NOT DETECTED Final   Klebsiella pneumoniae NOT DETECTED NOT DETECTED Final    Proteus species NOT DETECTED NOT DETECTED Final   Serratia marcescens NOT DETECTED NOT DETECTED Final   Carbapenem resistance NOT DETECTED NOT DETECTED Final   Haemophilus influenzae NOT DETECTED NOT DETECTED Final   Neisseria meningitidis NOT DETECTED NOT DETECTED Final   Pseudomonas aeruginosa NOT DETECTED NOT DETECTED Final   Candida albicans NOT DETECTED  NOT DETECTED Final   Candida glabrata NOT DETECTED NOT DETECTED Final   Candida krusei NOT DETECTED NOT DETECTED Final   Candida parapsilosis NOT DETECTED NOT DETECTED Final   Candida tropicalis NOT DETECTED NOT DETECTED Final  Culture, Urine     Status: Abnormal (Preliminary result)   Collection Time: 08/09/17  7:02 PM  Result Value Ref Range Status   Specimen Description URINE, RANDOM  Final   Special Requests NONE  Final   Culture (A)  Final    >=100,000 COLONIES/mL ESCHERICHIA COLI SUSCEPTIBILITIES TO FOLLOW    Report Status PENDING  Incomplete      Radiology Studies: Dg Chest 2 View  Result Date: 08/09/2017 CLINICAL DATA:  Initial evaluation for acute fever, tachycardia, fall. EXAM: CHEST  2 VIEW COMPARISON:  Prior radiograph from 07/07/2013. FINDINGS: Cardiac and mediastinal silhouettes are within normal limits. Aortic atherosclerosis. Lungs hypoinflated. Mild bibasilar atelectasis. No focal infiltrates. No pulmonary edema. Blunting of left costophrenic angle suggestive of small left pleural effusion. No pneumothorax. No acute osseus abnormality. IMPRESSION: 1. Shallow lung inflation with mild bibasilar atelectasis. 2. Small left pleural effusion. 3. Aortic atherosclerosis. Electronically Signed   By: Jeannine Boga M.D.   On: 08/09/2017 21:06   Dg Shoulder Right  Result Date: 08/09/2017 CLINICAL DATA:  Initial evaluation for acute trauma, fall. EXAM: RIGHT SHOULDER - 2+ VIEW COMPARISON:  None. FINDINGS: No acute fracture dislocation. Degenerative osteoarthritic changes present about the right glenohumeral and  acromioclavicular joints. No periarticular calcification. No soft tissue abnormality. Visualize right hemithorax grossly clear. IMPRESSION: No acute osseous abnormality about the right shoulder. Electronically Signed   By: Jeannine Boga M.D.   On: 08/09/2017 21:08   Ct Head Wo Contrast  Result Date: 08/09/2017 CLINICAL DATA:  Recent falls EXAM: CT HEAD WITHOUT CONTRAST CT CERVICAL SPINE WITHOUT CONTRAST TECHNIQUE: Multidetector CT imaging of the head and cervical spine was performed following the standard protocol without intravenous contrast. Multiplanar CT image reconstructions of the cervical spine were also generated. COMPARISON:  None. FINDINGS: CT HEAD FINDINGS Brain: No evidence of acute infarction, hemorrhage, hydrocephalus, extra-axial collection or mass lesion/mass effect. Vascular: Negative for hyperdense vessel Skull: Negative Sinuses/Orbits: Mild mucosal edema in the paranasal sinuses. Bilateral lens replacement Other: None CT CERVICAL SPINE FINDINGS Alignment: Normal Skull base and vertebrae: Negative for fracture Soft tissues and spinal canal: Negative Disc levels: Mild cervical spine disc degeneration. Mild disc degeneration and mild spurring C3-4 and C4-5 Upper chest: Negative Other: None IMPRESSION: Negative CT head Negative for cervical spine fracture Electronically Signed   By: Franchot Gallo M.D.   On: 08/09/2017 20:56   Ct Cervical Spine Wo Contrast  Result Date: 08/09/2017 CLINICAL DATA:  Recent falls EXAM: CT HEAD WITHOUT CONTRAST CT CERVICAL SPINE WITHOUT CONTRAST TECHNIQUE: Multidetector CT imaging of the head and cervical spine was performed following the standard protocol without intravenous contrast. Multiplanar CT image reconstructions of the cervical spine were also generated. COMPARISON:  None. FINDINGS: CT HEAD FINDINGS Brain: No evidence of acute infarction, hemorrhage, hydrocephalus, extra-axial collection or mass lesion/mass effect. Vascular: Negative for  hyperdense vessel Skull: Negative Sinuses/Orbits: Mild mucosal edema in the paranasal sinuses. Bilateral lens replacement Other: None CT CERVICAL SPINE FINDINGS Alignment: Normal Skull base and vertebrae: Negative for fracture Soft tissues and spinal canal: Negative Disc levels: Mild cervical spine disc degeneration. Mild disc degeneration and mild spurring C3-4 and C4-5 Upper chest: Negative Other: None IMPRESSION: Negative CT head Negative for cervical spine fracture Electronically Signed   By: Juanda Crumble  Carlis Abbott M.D.   On: 08/09/2017 20:56     Scheduled Meds: . amitriptyline  25 mg Oral QHS  . aspirin EC  81 mg Oral Daily  . atorvastatin  80 mg Oral q1800  . diclofenac sodium  2 g Topical QID  . enoxaparin (LOVENOX) injection  40 mg Subcutaneous Q24H  . gabapentin  400 mg Oral QID  . insulin aspart  0-15 Units Subcutaneous TID WC  . sertraline  100 mg Oral BID  . sodium chloride flush  3 mL Intravenous Q12H  . Vitamin D (Ergocalciferol)  50,000 Units Oral Q7 days   Continuous Infusions: . cefTRIAXone (ROCEPHIN)  IV       LOS: 2 days   Time Spent in minutes   30 minutes  Jacquie Lukes D.O. on 08/11/2017 at 11:13 AM  Between 7am to 7pm - Pager - [phone]  After 7pm go to [email or web address] - password [email or web address]  And look for the night coverage person covering for me after hours  [facility] Office  [phone]

## 2017-08-12 DIAGNOSIS — S0990XA Unspecified injury of head, initial encounter: Secondary | ICD-10-CM

## 2017-08-12 DIAGNOSIS — N3000 Acute cystitis without hematuria: Secondary | ICD-10-CM

## 2017-08-12 DIAGNOSIS — B9620 Unspecified Escherichia coli [E. coli] as the cause of diseases classified elsewhere: Secondary | ICD-10-CM

## 2017-08-12 DIAGNOSIS — W19XXXA Unspecified fall, initial encounter: Secondary | ICD-10-CM

## 2017-08-12 LAB — URINE CULTURE: Culture: >=100000 — AB

## 2017-08-12 LAB — GLUCOSE, CAPILLARY
Glucose-Capillary: 112 mg/dL — ABNORMAL HIGH (ref 65–99)
Glucose-Capillary: 170 mg/dL — ABNORMAL HIGH (ref 65–99)
Glucose-Capillary: 206 mg/dL — ABNORMAL HIGH (ref 65–99)

## 2017-08-12 LAB — CBC
HEMATOCRIT: 28.4 % — AB (ref 36.0–46.0)
Hemoglobin: 9.3 g/dL — ABNORMAL LOW (ref 12.0–15.0)
MCH: 26.8 pg (ref 26.0–34.0)
MCHC: 32.7 g/dL (ref 30.0–36.0)
MCV: 81.8 fL (ref 78.0–100.0)
Platelets: 138 10*3/uL — ABNORMAL LOW (ref 150–400)
RBC: 3.47 MIL/uL — ABNORMAL LOW (ref 3.87–5.11)
RDW: 14.7 % (ref 11.5–15.5)
WBC: 5.1 10*3/uL (ref 4.0–10.5)

## 2017-08-12 LAB — BASIC METABOLIC PANEL
Anion gap: 11 (ref 5–15)
BUN: 11 mg/dL (ref 6–20)
CO2: 21 mmol/L — AB (ref 22–32)
CREATININE: 0.72 mg/dL (ref 0.44–1.00)
Calcium: 7.7 mg/dL — ABNORMAL LOW (ref 8.9–10.3)
Chloride: 103 mmol/L (ref 101–111)
GFR calc non Af Amer: >60 mL/min (ref >60)
GLUCOSE: 125 mg/dL — AB (ref 65–99)
Potassium: 3.2 mmol/L — ABNORMAL LOW (ref 3.5–5.1)
Sodium: 135 mmol/L (ref 135–145)

## 2017-08-12 LAB — PHOSPHORUS: Phosphorus: 1.5 mg/dL — ABNORMAL LOW (ref 2.5–4.6)

## 2017-08-12 LAB — MAGNESIUM: Magnesium: 1.8 mg/dL (ref 1.7–2.4)

## 2017-08-12 MED ORDER — CEFUROXIME AXETIL 500 MG PO TABS: 500.0000 mg | ORAL_TABLET | Freq: Two times a day (BID) | ORAL | Status: DC

## 2017-08-12 MED ORDER — GABAPENTIN 400 MG PO CAPS: 400.0000 mg | ORAL_CAPSULE | Freq: Four times a day (QID) | ORAL | 0 refills | Status: DC

## 2017-08-12 MED ORDER — CEFUROXIME AXETIL 500 MG PO TABS: 500.0000 mg | ORAL_TABLET | Freq: Two times a day (BID) | ORAL | 0 refills | Status: DC

## 2017-08-12 MED ORDER — VITAMIN D (ERGOCALCIFEROL) 1.25 MG (50000 UNIT) PO CAPS: 50000.0000 [IU] | ORAL_CAPSULE | ORAL | 0 refills | Status: DC

## 2017-08-12 MED ORDER — POTASSIUM PHOSPHATES 15 MMOLE/5ML IV SOLN
30.0000 mmol | Freq: Once | INTRAVENOUS | Status: DC
  Administered 2017-08-12: 30 mmol via INTRAVENOUS
  Filled 2017-08-12: qty 10

## 2017-08-12 MED ORDER — POTASSIUM CHLORIDE CRYS ER 20 MEQ PO TBCR
40.0000 meq | EXTENDED_RELEASE_TABLET | Freq: Two times a day (BID) | ORAL | Status: DC
  Administered 2017-08-12: 40 meq via ORAL
  Filled 2017-08-12: qty 2

## 2017-08-12 NOTE — Discharge Summary (Signed)
Physician Discharge Summary  Casey Sanders AST:[id number] DOB: [birth year] DOA: 08/09/2017  PCP: Eustaquio Maize, MD  Admit date: 08/09/2017 Discharge date: 08/12/2017  Admitted From: Home Disposition: Home (Patient has refused Home Health PT/OT)  Recommendations for Outpatient Follow-up:  1. Follow up with PCP in 1-2 weeks 2. Please obtain BMP/CBC in one week 3. Please follow up on the following pending results: Repeat Blood Cultures   Home Health: Patient refused Equipment/Devices: Wide Rolling Walker with 5" Wheels; 3-In-1 Bedside Commode  Discharge Condition: Stable CODE STATUS: FULL CODE Diet recommendation:   Brief/Interim Summary: This is a 56 year old obese woman with diabetes, fibromyalgia, neuropathy, elevated cardiac risk, anxiety/depression who presents with her husband for frequent falls over the past 1-2 days. History was obtained from the patient as well as the husband. She lives at home with her husband and grandson, typically walks with a 4 wheeled walker or a cane, chronically has some unsteadiness on her feet due to neuropathy, but over the past 1-2 days she is fallen multiple times from worsening balance issues/unsteadiness. Associated symptoms include increased urinary frequency, new onset urinary incontinence, dysuria. She denied any nausea vomiting or diarrhea. No headaches. She reports mild shortness of breath. She reports she has had multiple UTIs in the past, she reports she was treated for one a little over a week ago she cannot recall that the antimicrobial agent that she took. She is on an SGL T-2 inhibitor and other oral medications as well as a GLP-1 agonist. She takes the maximum dose of gabapentin at 800 mg 4 times daily, she reports that she is noticed some jerking, her husband has not noticed this. She was admitted and found to be septic from an E Coli UTI and Bacteremia. Sensitivities were done and patient was pan-sensitive and she was transitioned from  IV Abx to po Abx. PT evaluated and recommended Home Health PT but patient refused. She was deemed medically stable to D/C Home and will need to follow up with PCP within 1 week.   Discharge Diagnoses:  Active Problems:   Sepsis secondary to UTI ([place])  Severe sepsis secondary to EcoliUTI/Bacteremia -Presented with fever, tachycardia, tachypnea, hypotension (which improved with fluids) -UA: TNTC WBC, no bacteria, moderate leukocytes, turbid appearance -Urine culture >100K Ecoli that was Pan-sensitive  -Blood cultures 1/2 Ecoli, Enterobacteriaceae E Coli  -influenza PCR negative -was initially placed on vanc/zosy, transitioned to ceftriaxone and then changed to po  -Repeat blood cultures show NGTD < 24 Hours  Frequent falls/Generalized weakness -possibly multifactorial: deconditioning, possible UTI, neuropathy, Vit D def -PT consulted; recommended HH, 3in1, and walker -Case management consulted -Patient essentially then refused Home Health at D/C   Hypertension -patient with hypotension on admission, lisinopril held and ok to restart at D/C  Hyponatremia -improved, possible due to dehydration -Continued IVF and monitor BMP as an outpatient   Fibromyalgia -continue Amitriptyline 25 mg po qHS  Diabetes mellitus, type II with neuropathy -continue Moderate Novolog Insulin Sliding scale AC -Glimepiride, Trulcity, and jardiance held  -continue Gabapentin 400 mg po 4 times daily for neuopathy -CBG's ranging from 75-206  Vitamin D deficiency -Vit D 25-hydroxy: 10.6 -will start on Vit D 50K units weekly x 6 weeks and will need to follow up with PCP   Anxiety/Depression -Cotninue Sertraline 100 mg po BID and Amitriptyline 25 mg po qHS  Hypokalemia -Patient's K+ with 3.2 this AM -Replete with po KCl 40 mEQ po BID x 2 -Check Mag  -Continue to Monitor and Replete as  Necessary -Repeat CMP as an outpatient   Hypophosphatemia -Patient's Phos Level was 1.5 this AM -Replete  with 30 mEQ of KPhos -Continue to Monitor and Replete as Necessary -Repeat CMP as an outpatient   Discharge Instructions  Discharge Instructions    Call MD for:  difficulty breathing, headache or visual disturbances    Complete by:  As directed    Call MD for:  extreme fatigue    Complete by:  As directed    Call MD for:  hives    Complete by:  As directed    Call MD for:  persistant dizziness or light-headedness    Complete by:  As directed    Call MD for:  persistant nausea and vomiting    Complete by:  As directed    Call MD for:  redness, tenderness, or signs of infection (pain, swelling, redness, odor or green/yellow discharge around incision site)    Complete by:  As directed    Call MD for:  severe uncontrolled pain    Complete by:  As directed    Call MD for:  temperature >100.4    Complete by:  As directed    Diet - low sodium heart healthy    Complete by:  As directed    Discharge instructions    Complete by:  As directed    Follow up with PCP within 1 week. Take all medications as prescribed. If symptoms change or worsen please return to the ED for evaluation.   Increase activity slowly    Complete by:  As directed      Allergies as of 08/12/2017      Reactions   Codeine Hives, Nausea And Vomiting, Other (See Comments)   Metformin And Related    nausea   Niaspan [niacin Er] Other (See Comments)   Increase blood glucose      Medication List    STOP taking these medications   famotidine 20 MG tablet Commonly known as:  PEPCID   gabapentin 800 MG tablet Commonly known as:  NEURONTIN Replaced by:  gabapentin 400 MG capsule   JARDIANCE 25 MG Tabs tablet Generic drug:  empagliflozin     TAKE these medications   albuterol 108 (90 Base) MCG/ACT inhaler Commonly known as:  VENTOLIN HFA Inhale 1 puff into the lungs every 6 (six) hours as needed for wheezing or shortness of breath.   amitriptyline 25 MG tablet Commonly known as:  ELAVIL Take one or two  tablets at bedtime. What changed:  how much to take  how to take this  when to take this  additional instructions   aspirin 81 MG EC tablet Take 81 mg by mouth daily.   atorvastatin 80 MG tablet Commonly known as:  LIPITOR TAKE ONE TABLET BY MOUTH ONCE DAILY AT  6  PM What changed:  See the new instructions.   cefUROXime 500 MG tablet Commonly known as:  CEFTIN Take 1 tablet (500 mg total) by mouth 2 (two) times daily with a meal.   clonazePAM 1 MG tablet Commonly known as:  KLONOPIN Take 1 mg by mouth at bedtime.   cyclobenzaprine 10 MG tablet Commonly known as:  FLEXERIL Take 10 mg by mouth 3 (three) times daily as needed for muscle spasms.   diclofenac sodium 1 % Gel Commonly known as:  VOLTAREN Apply 2 g topically 4 (four) times daily.   FINGERSTIX LANCETS Misc Use to check BG up to bid.  Dx:  250.00   fish  oil-omega-3 fatty acids 1000 MG capsule Take 2 g by mouth daily.   fluticasone 50 MCG/ACT nasal spray Commonly known as:  FLONASE 2 sprays by Nasal route daily. 1 spray each nostril qd   gabapentin 400 MG capsule Commonly known as:  NEURONTIN Take 1 capsule (400 mg total) by mouth 4 (four) times daily. Replaces:  gabapentin 800 MG tablet   glimepiride 4 MG tablet Commonly known as:  AMARYL TAKE 1 TABLET BY MOUTH THREE TIMES DAILY AS DIRECTED What changed:  See the new instructions.   glucose blood test strip Commonly known as:  TRUETRACK TEST Use to check BG up to bid.  Dx:  250.00   lidocaine 5 % Commonly known as:  LIDODERM Place 3 patches onto the skin daily. Apply up to 3 patches to area of pain for 12 hour, then Remove & Discard patch.  May reapply patch(es) after being patch free for 12 hours.   lisinopril 10 MG tablet Commonly known as:  PRINIVIL,ZESTRIL TAKE ONE TABLET BY MOUTH ONE TIME DAILY What changed:  See the new instructions.   sertraline 100 MG tablet Commonly known as:  ZOLOFT Take 100 mg by mouth 2 (two) times daily.    TRULICITY 1.5 VZ/8.5YI Sopn Generic drug:  Dulaglutide INJECT 1 DOSE SUBCUTANEOUSLY ONCE A WEEK   Vitamin D (Ergocalciferol) 50000 units Caps capsule Commonly known as:  DRISDOL Take 1 capsule (50,000 Units total) by mouth every 7 (seven) days.       Allergies  Allergen Reactions  . Codeine Hives, Nausea And Vomiting and Other (See Comments)  . Metformin And Related     nausea  . Niaspan [Niacin Er] Other (See Comments)    Increase blood glucose    Consultations:  None  Procedures/Studies: Dg Chest 2 View  Result Date: 08/09/2017 CLINICAL DATA:  Initial evaluation for acute fever, tachycardia, fall. EXAM: CHEST  2 VIEW COMPARISON:  Prior radiograph from 07/07/2013. FINDINGS: Cardiac and mediastinal silhouettes are within normal limits. Aortic atherosclerosis. Lungs hypoinflated. Mild bibasilar atelectasis. No focal infiltrates. No pulmonary edema. Blunting of left costophrenic angle suggestive of small left pleural effusion. No pneumothorax. No acute osseus abnormality. IMPRESSION: 1. Shallow lung inflation with mild bibasilar atelectasis. 2. Small left pleural effusion. 3. Aortic atherosclerosis. Electronically Signed   By: Jeannine Boga M.D.   On: 08/09/2017 21:06   Dg Shoulder Right  Result Date: 08/09/2017 CLINICAL DATA:  Initial evaluation for acute trauma, fall. EXAM: RIGHT SHOULDER - 2+ VIEW COMPARISON:  None. FINDINGS: No acute fracture dislocation. Degenerative osteoarthritic changes present about the right glenohumeral and acromioclavicular joints. No periarticular calcification. No soft tissue abnormality. Visualize right hemithorax grossly clear. IMPRESSION: No acute osseous abnormality about the right shoulder. Electronically Signed   By: Jeannine Boga M.D.   On: 08/09/2017 21:08   Ct Head Wo Contrast  Result Date: 08/09/2017 CLINICAL DATA:  Recent falls EXAM: CT HEAD WITHOUT CONTRAST CT CERVICAL SPINE WITHOUT CONTRAST TECHNIQUE: Multidetector CT  imaging of the head and cervical spine was performed following the standard protocol without intravenous contrast. Multiplanar CT image reconstructions of the cervical spine were also generated. COMPARISON:  None. FINDINGS: CT HEAD FINDINGS Brain: No evidence of acute infarction, hemorrhage, hydrocephalus, extra-axial collection or mass lesion/mass effect. Vascular: Negative for hyperdense vessel Skull: Negative Sinuses/Orbits: Mild mucosal edema in the paranasal sinuses. Bilateral lens replacement Other: None CT CERVICAL SPINE FINDINGS Alignment: Normal Skull base and vertebrae: Negative for fracture Soft tissues and spinal canal: Negative Disc levels: Mild cervical  spine disc degeneration. Mild disc degeneration and mild spurring C3-4 and C4-5 Upper chest: Negative Other: None IMPRESSION: Negative CT head Negative for cervical spine fracture Electronically Signed   By: Franchot Gallo M.D.   On: 08/09/2017 20:56   Ct Cervical Spine Wo Contrast  Result Date: 08/09/2017 CLINICAL DATA:  Recent falls EXAM: CT HEAD WITHOUT CONTRAST CT CERVICAL SPINE WITHOUT CONTRAST TECHNIQUE: Multidetector CT imaging of the head and cervical spine was performed following the standard protocol without intravenous contrast. Multiplanar CT image reconstructions of the cervical spine were also generated. COMPARISON:  None. FINDINGS: CT HEAD FINDINGS Brain: No evidence of acute infarction, hemorrhage, hydrocephalus, extra-axial collection or mass lesion/mass effect. Vascular: Negative for hyperdense vessel Skull: Negative Sinuses/Orbits: Mild mucosal edema in the paranasal sinuses. Bilateral lens replacement Other: None CT CERVICAL SPINE FINDINGS Alignment: Normal Skull base and vertebrae: Negative for fracture Soft tissues and spinal canal: Negative Disc levels: Mild cervical spine disc degeneration. Mild disc degeneration and mild spurring C3-4 and C4-5 Upper chest: Negative Other: None IMPRESSION: Negative CT head Negative for  cervical spine fracture Electronically Signed   By: Franchot Gallo M.D.   On: 08/09/2017 20:56    Subjective: Seen and examined at bedside and felt better. No CP or SOB. States she feels stronger and ready to go home.  Discharge Exam: Vitals:   08/12/17 0456 08/12/17 0900  BP: 125/70 (!) 121/59  Pulse: (!) 102 93  Resp: 18 18  Temp: 99.7 F (37.6 C) 98.1 F (36.7 C)  SpO2: 98% 92%   Vitals:   08/11/17 1840 08/11/17 2136 08/12/17 0456 08/12/17 0900  BP: 124/74 123/61 125/70 (!) 121/59  Pulse: (!) 109 (!) 105 (!) 102 93  Resp: 20 19 18 18   Temp: 98.2 F (36.8 C) 99.2 F (37.3 C) 99.7 F (37.6 C) 98.1 F (36.7 C)  TempSrc: Oral Oral Oral Oral  SpO2: 94% 94% 98% 92%  Weight:      Height:       General: Pt is alert, awake, not in acute distress Cardiovascular: RRR, S1/S2 +, no rubs, no gallops Respiratory: CTA bilaterally, no wheezing, no rhonchi Abdominal: Soft, NT, ND, bowel sounds + Extremities: minimal edema, no cyanosis  The results of significant diagnostics from this hospitalization (including imaging, microbiology, ancillary and laboratory) are listed below for reference.    Microbiology: Recent Results (from the past 240 hour(s))  Culture, blood (Routine x 2)     Status: None (Preliminary result)   Collection Time: 08/09/17  6:30 PM  Result Value Ref Range Status   Specimen Description BLOOD RIGHT ANTECUBITAL  Final   Special Requests   Final    BOTTLES DRAWN AEROBIC AND ANAEROBIC Blood Culture adequate volume   Culture NO GROWTH 3 DAYS  Final   Report Status PENDING  Incomplete  Culture, blood (Routine x 2)     Status: Abnormal (Preliminary result)   Collection Time: 08/09/17  6:48 PM  Result Value Ref Range Status   Specimen Description BLOOD LEFT ANTECUBITAL  Final   Special Requests   Final    BOTTLES DRAWN AEROBIC AND ANAEROBIC Blood Culture adequate volume   Culture  Setup Time   Final    GRAM NEGATIVE RODS AEROBIC BOTTLE ONLY CRITICAL RESULT CALLED  TO, READ BACK BY AND VERIFIED WITH: Alvino Chapel.D. 10:35 08/11/17 ([email or web address])    Culture ESCHERICHIA COLI SUSCEPTIBILITIES TO FOLLOW  (A)  Final   Report Status PENDING  Incomplete  Blood Culture ID Panel (Reflexed)  Status: Abnormal   Collection Time: 08/09/17  6:48 PM  Result Value Ref Range Status   Enterococcus species NOT DETECTED NOT DETECTED Final   Listeria monocytogenes NOT DETECTED NOT DETECTED Final   Staphylococcus species NOT DETECTED NOT DETECTED Final   Staphylococcus aureus NOT DETECTED NOT DETECTED Final   Streptococcus species NOT DETECTED NOT DETECTED Final   Streptococcus agalactiae NOT DETECTED NOT DETECTED Final   Streptococcus pneumoniae NOT DETECTED NOT DETECTED Final   Streptococcus pyogenes NOT DETECTED NOT DETECTED Final   Acinetobacter baumannii NOT DETECTED NOT DETECTED Final   Enterobacteriaceae species DETECTED (A) NOT DETECTED Final    Comment: Enterobacteriaceae represent a large family of gram-negative bacteria, not a single organism. CRITICAL RESULT CALLED TO, READ BACK BY AND VERIFIED WITH: Alvino Chapel.D. 10:35 08/11/17 ([email or web address])    Enterobacter cloacae complex NOT DETECTED NOT DETECTED Final   Escherichia coli DETECTED (A) NOT DETECTED Final    Comment: CRITICAL RESULT CALLED TO, READ BACK BY AND VERIFIED WITH: EDaisy Lazar.D. 10:35 08/11/17 ([email or web address])    Klebsiella oxytoca NOT DETECTED NOT DETECTED Final   Klebsiella pneumoniae NOT DETECTED NOT DETECTED Final   Proteus species NOT DETECTED NOT DETECTED Final   Serratia marcescens NOT DETECTED NOT DETECTED Final   Carbapenem resistance NOT DETECTED NOT DETECTED Final   Haemophilus influenzae NOT DETECTED NOT DETECTED Final   Neisseria meningitidis NOT DETECTED NOT DETECTED Final   Pseudomonas aeruginosa NOT DETECTED NOT DETECTED Final   Candida albicans NOT DETECTED NOT DETECTED Final   Candida glabrata NOT DETECTED NOT DETECTED Final   Candida krusei NOT DETECTED NOT DETECTED  Final   Candida parapsilosis NOT DETECTED NOT DETECTED Final   Candida tropicalis NOT DETECTED NOT DETECTED Final  Culture, Urine     Status: Abnormal   Collection Time: 08/09/17  7:02 PM  Result Value Ref Range Status   Specimen Description URINE, RANDOM  Final   Special Requests NONE  Final   Culture >=100,000 COLONIES/mL ESCHERICHIA COLI (A)  Final   Report Status 08/12/2017 FINAL  Final   Organism ID, Bacteria ESCHERICHIA COLI (A)  Final      Susceptibility   Escherichia coli - MIC*    AMPICILLIN 4 SENSITIVE Sensitive     CEFAZOLIN <=4 SENSITIVE Sensitive     CEFTRIAXONE <=1 SENSITIVE Sensitive     CIPROFLOXACIN <=0.25 SENSITIVE Sensitive     GENTAMICIN <=1 SENSITIVE Sensitive     IMIPENEM <=0.25 SENSITIVE Sensitive     NITROFURANTOIN <=16 SENSITIVE Sensitive     TRIMETH/SULFA <=20 SENSITIVE Sensitive     AMPICILLIN/SULBACTAM <=2 SENSITIVE Sensitive     PIP/TAZO <=4 SENSITIVE Sensitive     Extended ESBL NEGATIVE Sensitive     * >=100,000 COLONIES/mL ESCHERICHIA COLI  Culture, blood (routine x 2)     Status: None (Preliminary result)   Collection Time: 08/11/17 10:38 AM  Result Value Ref Range Status   Specimen Description BLOOD LEFT HAND  Final   Special Requests IN PEDIATRIC BOTTLE Blood Culture adequate volume  Final   Culture NO GROWTH < 24 HOURS  Final   Report Status PENDING  Incomplete  Culture, blood (routine x 2)     Status: None (Preliminary result)   Collection Time: 08/11/17 10:38 AM  Result Value Ref Range Status   Specimen Description BLOOD RIGHT ARM  Final   Special Requests IN PEDIATRIC BOTTLE Blood Culture adequate volume  Final   Culture NO GROWTH < 24 HOURS  Final   Report Status  PENDING  Incomplete    Labs: BNP (last 3 results) No results for input(s): BNP in the last 8760 hours. Basic Metabolic Panel:  Recent Labs Lab 08/09/17 1830 08/10/17 0412 08/11/17 0748 08/12/17 0311 08/12/17 0807  NA 127* 131* 135 135  --   K 3.5 3.5 3.5 3.2*  --    CL 98* 100* 105 103  --   CO2 21* 22 23 21*  --   GLUCOSE 258* 233* 78 125*  --   BUN 24* 18 11 11   --   CREATININE 1.09* 0.94 0.79 0.72  --   CALCIUM 8.1* 7.5* 7.7* 7.7*  --   MG  --   --   --   --  1.8  PHOS  --   --   --   --  1.5*   Liver Function Tests:  Recent Labs Lab 08/09/17 1830  AST 44*  ALT 40  ALKPHOS 50  BILITOT 1.2  PROT 7.0  ALBUMIN 3.2*   No results for input(s): LIPASE, AMYLASE in the last 168 hours. No results for input(s): AMMONIA in the last 168 hours. CBC:  Recent Labs Lab 08/09/17 1830 08/10/17 0412 08/11/17 0748 08/12/17 0311  WBC 7.2 5.1 4.5 5.1  NEUTROABS 6.1  --   --   --   HGB 11.6* 10.7* 9.5* 9.3*  HCT 34.3* 31.3* 29.5* 28.4*  MCV 81.5 82.4 83.1 81.8  PLT 147* 117* 120* 138*   Cardiac Enzymes:  Recent Labs Lab 08/09/17 1903  CKTOTAL 401*   BNP: Invalid input(s): POCBNP CBG:  Recent Labs Lab 08/11/17 1717 08/11/17 2131 08/12/17 0800 08/12/17 1159 08/12/17 1637  GLUCAP 118* 152* 112* 170* 206*   D-Dimer No results for input(s): DDIMER in the last 72 hours. Hgb A1c No results for input(s): HGBA1C in the last 72 hours. Lipid Profile No results for input(s): CHOL, HDL, LDLCALC, TRIG, CHOLHDL, LDLDIRECT in the last 72 hours. Thyroid function studies No results for input(s): TSH, T4TOTAL, T3FREE, THYROIDAB in the last 72 hours.  Invalid input(s): FREET3 Anemia work up No results for input(s): VITAMINB12, FOLATE, FERRITIN, TIBC, IRON, RETICCTPCT in the last 72 hours. Urinalysis    Component Value Date/Time   COLORURINE AMBER (A) 08/09/2017 1902   APPEARANCEUR TURBID (A) 08/09/2017 1902   LABSPEC 1.013 08/09/2017 1902   PHURINE 5.0 08/09/2017 1902   GLUCOSEU >=500 (A) 08/09/2017 1902   HGBUR SMALL (A) 08/09/2017 1902   BILIRUBINUR NEGATIVE 08/09/2017 1902   BILIRUBINUR neg 12/10/2015 1024   KETONESUR NEGATIVE 08/09/2017 1902   PROTEINUR 100 (A) 08/09/2017 1902   UROBILINOGEN negative 12/10/2015 1024    UROBILINOGEN 1.0 07/07/2013 1143   NITRITE NEGATIVE 08/09/2017 1902   LEUKOCYTESUR MODERATE (A) 08/09/2017 1902   Sepsis Labs Invalid input(s): PROCALCITONIN,  WBC,  LACTICIDVEN Microbiology Recent Results (from the past 240 hour(s))  Culture, blood (Routine x 2)     Status: None (Preliminary result)   Collection Time: 08/09/17  6:30 PM  Result Value Ref Range Status   Specimen Description BLOOD RIGHT ANTECUBITAL  Final   Special Requests   Final    BOTTLES DRAWN AEROBIC AND ANAEROBIC Blood Culture adequate volume   Culture NO GROWTH 3 DAYS  Final   Report Status PENDING  Incomplete  Culture, blood (Routine x 2)     Status: Abnormal (Preliminary result)   Collection Time: 08/09/17  6:48 PM  Result Value Ref Range Status   Specimen Description BLOOD LEFT ANTECUBITAL  Final   Special Requests  Final    BOTTLES DRAWN AEROBIC AND ANAEROBIC Blood Culture adequate volume   Culture  Setup Time   Final    GRAM NEGATIVE RODS AEROBIC BOTTLE ONLY CRITICAL RESULT CALLED TO, READ BACK BY AND VERIFIED WITH: EDaisy Lazar.D. 10:35 08/11/17 ([email or web address])    Culture ESCHERICHIA COLI SUSCEPTIBILITIES TO FOLLOW  (A)  Final   Report Status PENDING  Incomplete  Blood Culture ID Panel (Reflexed)     Status: Abnormal   Collection Time: 08/09/17  6:48 PM  Result Value Ref Range Status   Enterococcus species NOT DETECTED NOT DETECTED Final   Listeria monocytogenes NOT DETECTED NOT DETECTED Final   Staphylococcus species NOT DETECTED NOT DETECTED Final   Staphylococcus aureus NOT DETECTED NOT DETECTED Final   Streptococcus species NOT DETECTED NOT DETECTED Final   Streptococcus agalactiae NOT DETECTED NOT DETECTED Final   Streptococcus pneumoniae NOT DETECTED NOT DETECTED Final   Streptococcus pyogenes NOT DETECTED NOT DETECTED Final   Acinetobacter baumannii NOT DETECTED NOT DETECTED Final   Enterobacteriaceae species DETECTED (A) NOT DETECTED Final    Comment: Enterobacteriaceae represent a large  family of gram-negative bacteria, not a single organism. CRITICAL RESULT CALLED TO, READ BACK BY AND VERIFIED WITH: Alvino Chapel.D. 10:35 08/11/17 ([email or web address])    Enterobacter cloacae complex NOT DETECTED NOT DETECTED Final   Escherichia coli DETECTED (A) NOT DETECTED Final    Comment: CRITICAL RESULT CALLED TO, READ BACK BY AND VERIFIED WITH: EDaisy Lazar.D. 10:35 08/11/17 ([email or web address])    Klebsiella oxytoca NOT DETECTED NOT DETECTED Final   Klebsiella pneumoniae NOT DETECTED NOT DETECTED Final   Proteus species NOT DETECTED NOT DETECTED Final   Serratia marcescens NOT DETECTED NOT DETECTED Final   Carbapenem resistance NOT DETECTED NOT DETECTED Final   Haemophilus influenzae NOT DETECTED NOT DETECTED Final   Neisseria meningitidis NOT DETECTED NOT DETECTED Final   Pseudomonas aeruginosa NOT DETECTED NOT DETECTED Final   Candida albicans NOT DETECTED NOT DETECTED Final   Candida glabrata NOT DETECTED NOT DETECTED Final   Candida krusei NOT DETECTED NOT DETECTED Final   Candida parapsilosis NOT DETECTED NOT DETECTED Final   Candida tropicalis NOT DETECTED NOT DETECTED Final  Culture, Urine     Status: Abnormal   Collection Time: 08/09/17  7:02 PM  Result Value Ref Range Status   Specimen Description URINE, RANDOM  Final   Special Requests NONE  Final   Culture >=100,000 COLONIES/mL ESCHERICHIA COLI (A)  Final   Report Status 08/12/2017 FINAL  Final   Organism ID, Bacteria ESCHERICHIA COLI (A)  Final      Susceptibility   Escherichia coli - MIC*    AMPICILLIN 4 SENSITIVE Sensitive     CEFAZOLIN <=4 SENSITIVE Sensitive     CEFTRIAXONE <=1 SENSITIVE Sensitive     CIPROFLOXACIN <=0.25 SENSITIVE Sensitive     GENTAMICIN <=1 SENSITIVE Sensitive     IMIPENEM <=0.25 SENSITIVE Sensitive     NITROFURANTOIN <=16 SENSITIVE Sensitive     TRIMETH/SULFA <=20 SENSITIVE Sensitive     AMPICILLIN/SULBACTAM <=2 SENSITIVE Sensitive     PIP/TAZO <=4 SENSITIVE Sensitive     Extended ESBL NEGATIVE  Sensitive     * >=100,000 COLONIES/mL ESCHERICHIA COLI  Culture, blood (routine x 2)     Status: None (Preliminary result)   Collection Time: 08/11/17 10:38 AM  Result Value Ref Range Status   Specimen Description BLOOD LEFT HAND  Final   Special Requests IN PEDIATRIC BOTTLE Blood Culture adequate volume  Final  Culture NO GROWTH < 24 HOURS  Final   Report Status PENDING  Incomplete  Culture, blood (routine x 2)     Status: None (Preliminary result)   Collection Time: 08/11/17 10:38 AM  Result Value Ref Range Status   Specimen Description BLOOD RIGHT ARM  Final   Special Requests IN PEDIATRIC BOTTLE Blood Culture adequate volume  Final   Culture NO GROWTH < 24 HOURS  Final   Report Status PENDING  Incomplete   Time coordinating discharge: 35 minutes  SIGNED:  Kerney Elbe, DO [facility] 08/12/2017, 8:43 PM Pager [phone]  If 7PM-7AM, please contact night-coverage [email or web address] Password [email or web address]

## 2017-08-12 NOTE — Progress Notes (Signed)
Physical Therapy Treatment Patient Details Name: Casey Sanders MRN: [id number] DOB: [birth year] Today's Date: 08/12/2017    History of Present Illness This is a 56 year old obese woman with diabetes, fibromyalgia, neuropathy, elevated cardiac risk, anxiety/depression who presents with her husband for frequent falls over the past 1-2 days. Severe sepsis, likely urinary source    PT Comments    Continuing work on functional mobility and activity tolerance;  Much improved gait distance and smoothness; occasional cues to self-monitor for activity tolerance; OK for dc home from PT standpoint; Pt is asking about getting a new, wider RW; she tells me insurance paid for her existing one, but that was over 10 years ago   Follow Up Recommendations  Home health PT     Equipment Recommendations  Rolling walker with 5" wheels;3in1 (PT) (requesting wide RW)    Recommendations for Other Services       Precautions / Restrictions Precautions Precautions: Fall Precaution Comments: Fall risk reduced with use fo rW Restrictions Weight Bearing Restrictions: No    Mobility  Bed Mobility Overal bed mobility: Needs Assistance Bed Mobility: Rolling;Sidelying to Sit Rolling: Supervision Sidelying to sit: Supervision       General bed mobility comments: No need for physical assist  Transfers Overall transfer level: Needs assistance Equipment used: Rolling walker (2 wheeled) Transfers: Sit to/from Stand Sit to Stand: Min guard         General transfer comment: Able to power up without assist; cues to scoot fully to EOB  Ambulation/Gait Ambulation/Gait assistance: Min guard Ambulation Distance (Feet): 200 Feet Assistive device: Rolling walker (2 wheeled) Gait Pattern/deviations: Step-through pattern;Decreased step length - right;Decreased step length - left;Decreased stride length     General Gait Details: Cues to self-monitor for actvity tolerance; smooth pace   Stairs             Wheelchair Mobility    Modified Rankin (Stroke Patients Only)       Balance             Standing balance-Leahy Scale: Fair                              Cognition Arousal/Alertness: Awake/alert Behavior During Therapy: WFL for tasks assessed/performed Overall Cognitive Status: Within Functional Limits for tasks assessed                                        Exercises      General Comments General comments (skin integrity, edema, etc.): Dyspnea 2/4 with amb; O2 sats 97% on room air      Pertinent Vitals/Pain Pain Assessment: No/denies pain Pain Intervention(s): Monitored during session    Home Living                      Prior Function            PT Goals (current goals can now be found in the care plan section) Acute Rehab PT Goals Patient Stated Goal: less fibromyalgia pain PT Goal Formulation: With patient Time For Goal Achievement: 08/24/17 Potential to Achieve Goals: Good Progress towards PT goals: Progressing toward goals    Frequency    Min 3X/week      PT Plan Current plan remains appropriate    Co-evaluation              AM-PAC  PT "6 Clicks" Daily Activity  Outcome Measure  Difficulty turning over in bed (including adjusting bedclothes, sheets and blankets)?: None Difficulty moving from lying on back to sitting on the side of the bed? : None Difficulty sitting down on and standing up from a chair with arms (e.g., wheelchair, bedside commode, etc,.)?: A Little Help needed moving to and from a bed to chair (including a wheelchair)?: None Help needed walking in hospital room?: None Help needed climbing 3-5 steps with a railing? : A Little 6 Click Score: 22    End of Session Equipment Utilized During Treatment: Gait belt Activity Tolerance: Patient tolerated treatment well Patient left: in chair;with call bell/phone within reach Nurse Communication: Mobility status PT Visit Diagnosis:  Unsteadiness on feet (R26.81);Other abnormalities of gait and mobility (R26.89);Repeated falls (R29.6);Muscle weakness (generalized) (M62.81)     Time: 4098-1191 PT Time Calculation (min) (ACUTE ONLY): 28 min  Charges:  $Gait Training: 23-37 mins                    G Codes:       Roney [place], [place]  Acute Rehabilitation Services Pager [phone] Office [place] 08/12/2017, 2:33 PM

## 2017-08-12 NOTE — Care Management Note (Signed)
Case Management Note  Patient Details  Name: Casey Sanders MRN: [id number] Date of Birth: 1960-10-21  Subjective/Objective:                 Spoke w patient at the bedside. Encouraged patient to agree to [facility], however patient refused. She states she does not want people coming to her house. CM placed an order for a RW, referred to [facility] to have it delivered to room prior to DC.    Action/Plan:  No further CM needs at this time.  Expected Discharge Date:                  Expected Discharge Plan:  Home/Self Care  In-House Referral:     Discharge planning Services  CM Consult  Post Acute Care Choice:    Choice offered to:     DME Arranged:  Walker rolling DME Agency:  [place]:  Patient Refused [place] Agency:     Status of Service:  Completed, signed off  If discussed at [place] of Stay Meetings, dates discussed:    Additional Comments:  Carles Collet, RN 08/12/2017, 2:56 PM

## 2017-08-13 LAB — CULTURE, BLOOD (ROUTINE X 2): Special Requests: ADEQUATE

## 2017-08-14 ENCOUNTER — Telehealth: Payer: Self-pay | Admitting: Pediatrics

## 2017-08-14 LAB — CULTURE, BLOOD (ROUTINE X 2)
Culture: NO GROWTH
SPECIAL REQUESTS: ADEQUATE

## 2017-08-14 NOTE — Telephone Encounter (Signed)
appt scheduled

## 2017-08-16 ENCOUNTER — Other Ambulatory Visit: Payer: Self-pay | Admitting: Pediatrics

## 2017-08-16 LAB — CULTURE, BLOOD (ROUTINE X 2)
Culture: NO GROWTH
Culture: NO GROWTH
SPECIAL REQUESTS: ADEQUATE
Special Requests: ADEQUATE

## 2017-08-17 NOTE — Telephone Encounter (Signed)
Last seen 03/09/17  Dr Sabra Heck Dr [name] PCP

## 2017-08-20 ENCOUNTER — Ambulatory Visit (INDEPENDENT_AMBULATORY_CARE_PROVIDER_SITE_OTHER): Payer: 59 | Admitting: Pediatrics

## 2017-08-20 ENCOUNTER — Other Ambulatory Visit: Payer: Self-pay

## 2017-08-20 ENCOUNTER — Encounter: Payer: Self-pay | Admitting: Pediatrics

## 2017-08-20 VITALS — BP 137/83 | HR 88 | Temp 98.1°F | Ht 61.0 in | Wt 224.2 lb

## 2017-08-20 DIAGNOSIS — M7989 Other specified soft tissue disorders: Secondary | ICD-10-CM | POA: Diagnosis not present

## 2017-08-20 DIAGNOSIS — Z09 Encounter for follow-up examination after completed treatment for conditions other than malignant neoplasm: Secondary | ICD-10-CM

## 2017-08-20 DIAGNOSIS — A4151 Sepsis due to Escherichia coli [E. coli]: Secondary | ICD-10-CM

## 2017-08-20 DIAGNOSIS — Z23 Encounter for immunization: Secondary | ICD-10-CM | POA: Diagnosis not present

## 2017-08-20 DIAGNOSIS — E119 Type 2 diabetes mellitus without complications: Secondary | ICD-10-CM | POA: Diagnosis not present

## 2017-08-20 LAB — BAYER DCA HB A1C WAIVED: HB A1C: 8.3 % — AB (ref <7.0)
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
## 2017-08-21 ENCOUNTER — Ambulatory Visit (HOSPITAL_COMMUNITY): Admission: RE | Admit: 2017-08-21 | Payer: 59 | Source: Ambulatory Visit

## 2017-08-21 ENCOUNTER — Telehealth: Payer: Self-pay | Admitting: Pediatrics

## 2017-08-21 LAB — CMP14+EGFR
ALBUMIN: 4.1 g/dL (ref 3.5–5.5)
ALK PHOS: 66 IU/L (ref 39–117)
ALT: 41 IU/L — ABNORMAL HIGH (ref 0–32)
AST: 34 IU/L (ref 0–40)
Albumin/Globulin Ratio: 1.5 (ref 1.2–2.2)
BUN / CREAT RATIO: 18 (ref 9–23)
BUN: 13 mg/dL (ref 6–24)
Bilirubin Total: 0.4 mg/dL (ref 0.0–1.2)
CHLORIDE: 101 mmol/L (ref 96–106)
CO2: 24 mmol/L (ref 20–29)
CREATININE: 0.73 mg/dL (ref 0.57–1.00)
Calcium: 9.5 mg/dL (ref 8.7–10.2)
GFR calc Af Amer: 105 mL/min/{1.73_m2} (ref >59)
GFR calc non Af Amer: 91 mL/min/{1.73_m2} (ref >59)
GLOBULIN, TOTAL: 2.8 g/dL (ref 1.5–4.5)
GLUCOSE: 115 mg/dL — AB (ref 65–99)
Potassium: 4.3 mmol/L (ref 3.5–5.2)
SODIUM: 143 mmol/L (ref 134–144)
Total Protein: 6.9 g/dL (ref 6.0–8.5)

## 2017-08-21 LAB — CBC WITH DIFFERENTIAL/PLATELET
BASOS ABS: 0 10*3/uL (ref 0.0–0.2)
Basos: 1 %
EOS (ABSOLUTE): 0.1 10*3/uL (ref 0.0–0.4)
Eos: 2 %
HEMOGLOBIN: 11.6 g/dL (ref 11.1–15.9)
Hematocrit: 34.7 % (ref 34.0–46.6)
Immature Grans (Abs): 0.1 10*3/uL (ref 0.0–0.1)
Immature Granulocytes: 1 %
LYMPHS ABS: 2.2 10*3/uL (ref 0.7–3.1)
Lymphs: 33 %
MCH: 26.9 pg (ref 26.6–33.0)
MCHC: 33.4 g/dL (ref 31.5–35.7)
MCV: 81 fL (ref 79–97)
MONOS ABS: 0.4 10*3/uL (ref 0.1–0.9)
Monocytes: 5 %
NEUTROS ABS: 3.9 10*3/uL (ref 1.4–7.0)
NEUTROS PCT: 58 %
Platelets: 396 10*3/uL — ABNORMAL HIGH (ref 150–379)
RBC: 4.31 x10E6/uL (ref 3.77–5.28)
RDW: 14.8 % (ref 12.3–15.4)
WBC: 6.5 10*3/uL (ref 3.4–10.8)

## 2017-08-21 NOTE — Telephone Encounter (Signed)
Patient is going to reschedule appointment at [facility] today for [place] and we wanted to let you know

## 2017-08-21 NOTE — Telephone Encounter (Signed)
Can you confirm with pt that she has ultrasound appt scheduled for Monday, she needs additional sample of 15mg  of xarelto, take BID until ultrasound. I talked with her earlier this morning.

## 2017-08-21 NOTE — Telephone Encounter (Signed)
Samples given. Someone else picked up samples so unsure of appt at this time

## 2017-08-24 ENCOUNTER — Ambulatory Visit (HOSPITAL_COMMUNITY)
Admission: RE | Admit: 2017-08-24 | Discharge: 2017-08-24 | Disposition: A | Discharge status: Home / Self Care | Payer: 59 | Source: Ambulatory Visit | Attending: Pediatrics | Admitting: Pediatrics

## 2017-08-24 DIAGNOSIS — M7989 Other specified soft tissue disorders: Secondary | ICD-10-CM

## 2017-09-04 ENCOUNTER — Other Ambulatory Visit: Payer: Self-pay | Admitting: Family Medicine

## 2017-09-11 ENCOUNTER — Other Ambulatory Visit: Payer: Self-pay | Admitting: *Deleted

## 2017-09-11 MED ORDER — GABAPENTIN 400 MG PO CAPS: 400.0000 mg | ORAL_CAPSULE | Freq: Four times a day (QID) | ORAL | 0 refills | Status: DC

## 2017-09-14 ENCOUNTER — Other Ambulatory Visit: Payer: Self-pay | Admitting: Pediatrics

## 2017-09-14 ENCOUNTER — Other Ambulatory Visit: Payer: Self-pay | Admitting: *Deleted

## 2017-09-14 MED ORDER — GABAPENTIN 400 MG PO CAPS: 400.0000 mg | ORAL_CAPSULE | Freq: Four times a day (QID) | ORAL | 1 refills | Status: DC

## 2017-09-18 ENCOUNTER — Ambulatory Visit (INDEPENDENT_AMBULATORY_CARE_PROVIDER_SITE_OTHER): Payer: 59 | Admitting: Pediatrics

## 2017-09-18 ENCOUNTER — Encounter: Payer: Self-pay | Admitting: Pediatrics

## 2017-09-18 VITALS — BP 124/82 | HR 104 | Temp 97.4°F | Ht 61.0 in | Wt 219.8 lb

## 2017-09-18 DIAGNOSIS — I10 Essential (primary) hypertension: Secondary | ICD-10-CM

## 2017-09-18 DIAGNOSIS — E1149 Type 2 diabetes mellitus with other diabetic neurological complication: Secondary | ICD-10-CM

## 2017-09-18 MED ORDER — INSULIN GLARGINE 100 UNIT/ML SOLOSTAR PEN: 15.0000 [IU] | PEN_INJECTOR | Freq: Every day | SUBCUTANEOUS | 99 refills | Status: DC

## 2017-09-18 NOTE — Patient Instructions (Signed)
Start with 15units of long acting insulin at night Every three days go up by 3 units if blood sugars for all three days are over 180  Goal morning fasting blood sugars low 100s
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
## 2017-09-21 NOTE — Progress Notes (Signed)
Refill called to [facility] 

## 2017-09-22 ENCOUNTER — Other Ambulatory Visit: Payer: Self-pay | Admitting: Pediatrics

## 2017-09-22 DIAGNOSIS — E1149 Type 2 diabetes mellitus with other diabetic neurological complication: Secondary | ICD-10-CM

## 2017-09-22 MED ORDER — INSULIN GLARGINE 100 UNIT/ML SOLOSTAR PEN: 15.0000 [IU] | PEN_INJECTOR | Freq: Every day | SUBCUTANEOUS | 99 refills | Status: DC

## 2017-09-22 NOTE — Telephone Encounter (Signed)
Rx sent to pharmacy   

## 2017-09-23 NOTE — Telephone Encounter (Signed)
Prescription was called into [place]

## 2017-10-06 ENCOUNTER — Other Ambulatory Visit: Payer: Self-pay | Admitting: Pediatrics

## 2017-10-29 ENCOUNTER — Other Ambulatory Visit: Payer: Self-pay | Admitting: Pediatrics

## 2017-11-03 ENCOUNTER — Other Ambulatory Visit: Payer: Self-pay | Admitting: Pediatrics

## 2017-11-03 DIAGNOSIS — E1149 Type 2 diabetes mellitus with other diabetic neurological complication: Secondary | ICD-10-CM

## 2017-11-19 ENCOUNTER — Ambulatory Visit (INDEPENDENT_AMBULATORY_CARE_PROVIDER_SITE_OTHER): Payer: 59 | Admitting: Pediatrics

## 2017-11-19 ENCOUNTER — Encounter: Payer: Self-pay | Admitting: Pediatrics

## 2017-11-19 VITALS — BP 139/89 | HR 94 | Temp 97.5°F | Ht 61.0 in | Wt 229.8 lb

## 2017-11-19 DIAGNOSIS — E559 Vitamin D deficiency, unspecified: Secondary | ICD-10-CM | POA: Diagnosis not present

## 2017-11-19 DIAGNOSIS — J069 Acute upper respiratory infection, unspecified: Secondary | ICD-10-CM

## 2017-11-19 DIAGNOSIS — E118 Type 2 diabetes mellitus with unspecified complications: Secondary | ICD-10-CM

## 2017-11-19 DIAGNOSIS — J020 Streptococcal pharyngitis: Secondary | ICD-10-CM

## 2017-11-19 DIAGNOSIS — R5381 Other malaise: Secondary | ICD-10-CM

## 2017-11-19 DIAGNOSIS — J029 Acute pharyngitis, unspecified: Secondary | ICD-10-CM

## 2017-11-19 LAB — BAYER DCA HB A1C WAIVED: HB A1C: 9.6 % — AB (ref <7.0)

## 2017-11-19 LAB — RAPID STREP SCREEN (MED CTR MEBANE ONLY): Strep Gp A Ag, IA W/Reflex: POSITIVE — AB

## 2017-11-19 MED ORDER — AMOXICILLIN 500 MG PO CAPS: 500.0000 mg | ORAL_CAPSULE | Freq: Two times a day (BID) | ORAL | 0 refills | Status: AC
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
## 2017-11-20 ENCOUNTER — Other Ambulatory Visit: Payer: Self-pay | Admitting: Pediatrics

## 2017-11-20 LAB — BMP8+EGFR
BUN/Creatinine Ratio: 20 (ref 9–23)
BUN: 15 mg/dL (ref 6–24)
CO2: 24 mmol/L (ref 20–29)
CREATININE: 0.75 mg/dL (ref 0.57–1.00)
Calcium: 9.8 mg/dL (ref 8.7–10.2)
Chloride: 98 mmol/L (ref 96–106)
GFR calc Af Amer: 102 mL/min/{1.73_m2} (ref >59)
GFR, EST NON AFRICAN AMERICAN: 88 mL/min/{1.73_m2} (ref >59)
GLUCOSE: 262 mg/dL — AB (ref 65–99)
Potassium: 4.7 mmol/L (ref 3.5–5.2)
SODIUM: 139 mmol/L (ref 134–144)

## 2017-11-20 LAB — VITAMIN D 25 HYDROXY (VIT D DEFICIENCY, FRACTURES): Vit D, 25-Hydroxy: 13.9 ng/mL — ABNORMAL LOW (ref 30.0–100.0)

## 2017-12-03 ENCOUNTER — Other Ambulatory Visit: Payer: Self-pay | Admitting: Pediatrics

## 2017-12-03 DIAGNOSIS — E559 Vitamin D deficiency, unspecified: Secondary | ICD-10-CM

## 2017-12-03 MED ORDER — VITAMIN D (ERGOCALCIFEROL) 1.25 MG (50000 UNIT) PO CAPS: 50000.0000 [IU] | ORAL_CAPSULE | ORAL | 0 refills | Status: DC

## 2017-12-17 ENCOUNTER — Ambulatory Visit: Payer: 59 | Admitting: Pediatrics

## 2017-12-28 ENCOUNTER — Other Ambulatory Visit: Payer: Self-pay | Admitting: Pediatrics

## 2018-01-04 ENCOUNTER — Ambulatory Visit: Payer: 59 | Admitting: Pediatrics

## 2018-01-11 ENCOUNTER — Other Ambulatory Visit: Payer: Self-pay | Admitting: Pediatrics

## 2018-01-18 ENCOUNTER — Encounter: Payer: Self-pay | Admitting: Pediatrics

## 2018-01-18 ENCOUNTER — Ambulatory Visit (INDEPENDENT_AMBULATORY_CARE_PROVIDER_SITE_OTHER): Payer: 59 | Admitting: Pediatrics

## 2018-01-18 VITALS — BP 136/86 | HR 94 | Temp 97.4°F | Ht 61.0 in | Wt 230.0 lb

## 2018-01-18 DIAGNOSIS — E1169 Type 2 diabetes mellitus with other specified complication: Secondary | ICD-10-CM | POA: Diagnosis not present

## 2018-01-18 DIAGNOSIS — E118 Type 2 diabetes mellitus with unspecified complications: Secondary | ICD-10-CM | POA: Diagnosis not present

## 2018-01-18 DIAGNOSIS — M797 Fibromyalgia: Secondary | ICD-10-CM

## 2018-01-18 DIAGNOSIS — E785 Hyperlipidemia, unspecified: Secondary | ICD-10-CM | POA: Diagnosis not present

## 2018-01-18 DIAGNOSIS — H5461 Unqualified visual loss, right eye, normal vision left eye: Secondary | ICD-10-CM | POA: Diagnosis not present

## 2018-01-18 DIAGNOSIS — I10 Essential (primary) hypertension: Secondary | ICD-10-CM

## 2018-01-18 DIAGNOSIS — E1149 Type 2 diabetes mellitus with other diabetic neurological complication: Secondary | ICD-10-CM

## 2018-01-18 LAB — BAYER DCA HB A1C WAIVED: HB A1C: 10.7 % — AB (ref <7.0)

## 2018-01-18 MED ORDER — GABAPENTIN 400 MG PO CAPS: 400.0000 mg | ORAL_CAPSULE | Freq: Four times a day (QID) | ORAL | 1 refills | Status: DC

## 2018-01-18 MED ORDER — ATORVASTATIN CALCIUM 80 MG PO TABS: ORAL_TABLET | ORAL | 3 refills | Status: DC

## 2018-01-18 MED ORDER — INSULIN GLARGINE 100 UNIT/ML SOLOSTAR PEN: 30.0000 [IU] | PEN_INJECTOR | Freq: Every day | SUBCUTANEOUS | 99 refills | Status: DC

## 2018-01-18 MED ORDER — DULAGLUTIDE 1.5 MG/0.5ML ~~LOC~~ SOAJ: SUBCUTANEOUS | 1 refills | Status: DC

## 2018-01-18 MED ORDER — GLIMEPIRIDE 4 MG PO TABS: 4.0000 mg | ORAL_TABLET | Freq: Two times a day (BID) | ORAL | 1 refills | Status: DC

## 2018-01-18 MED ORDER — LISINOPRIL 10 MG PO TABS: 10.0000 mg | ORAL_TABLET | Freq: Every day | ORAL | 3 refills | Status: DC

## 2018-01-18 NOTE — Progress Notes (Signed)
Subjective:   Patient ID: Casey Sanders, female    DOB: [birth year], 59 y.o.   MRN: [id number] CC: Follow-up (3 month) Follow-up multiple medical problems HPI: Casey Sanders is a 57 y.o. female presenting for Follow-up (3 month)  Decreased vision R eye ongoing for months.  So she was told she has macular degeneration in that.  Last visit over a year ago.  Previously following at [facility].  Did have cataracts removed from both eyes.  Diabetes: BGLs at home 200-280 checking in the morning when she gets up. Taking glimepiride in the morning with breakfast and at dinnertime.  Taking her evening insulin at dinnertime.  Avoiding sugary foods.  Hypertension: Taking her medicines regularly.  Continues to have knee pain bilaterally.  Following with orthopedics off and on.  Using walker regularly as she has been since an injury 16 years ago.  Fibromyalgia: Taking gabapentin and Flexeril regularly.  Helping with symptoms some.  Depression: Following with psychiatry.  Symptoms are stable.  Relevant past medical, surgical, family and social history reviewed. Allergies and medications reviewed and updated. Social History   Tobacco Use  Smoking Status Former Smoker  Smokeless Tobacco Never Used   ROS: Per HPI   Objective:    BP 136/86   Pulse 94   Temp (!) 97.4 F (36.3 C) (Oral)   Ht 5\' 1"  (1.549 m)   Wt 230 lb (104.3 kg)   BMI 43.46 kg/m   Wt Readings from Last 3 Encounters:  01/18/18 230 lb (104.3 kg)  11/19/17 229 lb 12.8 oz (104.2 kg)  09/18/17 219 lb 12.8 oz (99.7 kg)    Gen: NAD, alert, cooperative with exam, NCAT EYES: EOMI, no conjunctival injection, or no icterus CV: NRRR, normal S1/S2, no murmur, distal pulses 2+ b/l Resp: CTABL, no wheezes, normal WOB Abd: +BS, soft, NTND. no guarding or organomegaly Ext: No edema, warm Neuro: Alert and oriented, walks with walker. MSK: normal muscle bulk  Assessment & Plan:  Yajahira was seen today for  follow-up.  Diagnoses and all orders for this visit:  Type 2 diabetes mellitus with complication, unspecified whether long term insulin use (HCC) A1c elevated, 10.7 Increase insulin to 30 units.  Discussed risk of hypoglycemia while also taking sulfonylurea.  Patient does not want to take sulfonic urea only in the morning.  She will continue to check regularly for hypoglycemia.  Any symptoms let me know.  Follow-up in 4 weeks, bring blood sugar log. -     Bayer DCA Hb A1c Waived -     Microalbumin / creatinine urine ratio -     glimepiride (AMARYL) 4 MG tablet; Take 1 tablet (4 mg total) by mouth 2 (two) times daily. -     Microalbumin / creatinine urine ratio  Type 2 diabetes mellitus with other neurologic complication, without long-term current use of insulin (HCC) -     Dulaglutide (TRULICITY) 1.5 TD/3.2KG SOPN; INJECT 1 DOSE SUBQ ONCE WEEKLY -     Insulin Glargine (LANTUS SOLOSTAR) 100 UNIT/ML Solostar Pen; Inject 30 Units into the skin daily at 10 pm.  Decreased vision of right eye -     Ambulatory referral to Ophthalmology  Fibromyalgia -     gabapentin (NEURONTIN) 400 MG capsule; Take 1 capsule (400 mg total) by mouth 4 (four) times daily.  Essential hypertension -     lisinopril (PRINIVIL,ZESTRIL) 10 MG tablet; Take 1 tablet (10 mg total) by mouth daily.  Hyperlipidemia associated with type 2 diabetes mellitus ([place]) -  atorvastatin (LIPITOR) 80 MG tablet; TAKE 1 TABLET BY MOUTH ONCE DAILY AT  6  PM   Follow up plan: Return in about 1 month (around 02/15/2018). Assunta Found, MD [place]

## 2018-01-18 NOTE — Patient Instructions (Signed)
Schedule Diabetic Eye Exam    Fall Prevention in the Home Falls can cause injuries. They can happen to people of all ages. There are many things you can do to make your home safe and to help prevent falls. What can I do on the outside of my home?  Regularly fix the edges of walkways and driveways and fix any cracks.  Remove anything that might make you trip as you walk through a door, such as a raised step or threshold.  Trim any bushes or trees on the path to your home.  Use bright outdoor lighting.  Clear any walking paths of anything that might make someone trip, such as rocks or tools.  Regularly check to see if handrails are loose or broken. Make sure that both sides of any steps have handrails.  Any raised decks and porches should have guardrails on the edges.  Have any leaves, snow, or ice cleared regularly.  Use sand or salt on walking paths during winter.  Clean up any spills in your garage right away. This includes oil or grease spills. What can I do in the bathroom?  Use night lights.  Install grab bars by the toilet and in the tub and shower. Do not use towel bars as grab bars.  Use non-skid mats or decals in the tub or shower.  If you need to sit down in the shower, use a plastic, non-slip stool.  Keep the floor dry. Clean up any water that spills on the floor as soon as it happens.  Remove soap buildup in the tub or shower regularly.  Attach bath mats securely with double-sided non-slip rug tape.  Do not have throw rugs and other things on the floor that can make you trip. What can I do in the bedroom?  Use night lights.  Make sure that you have a light by your bed that is easy to reach.  Do not use any sheets or blankets that are too big for your bed. They should not hang down onto the floor.  Have a firm chair that has side arms. You can use this for support while you get dressed.  Do not have throw rugs and other things on the floor that can make  you trip. What can I do in the kitchen?  Clean up any spills right away.  Avoid walking on wet floors.  Keep items that you use a lot in easy-to-reach places.  If you need to reach something above you, use a strong step stool that has a grab bar.  Keep electrical cords out of the way.  Do not use floor polish or wax that makes floors slippery. If you must use wax, use non-skid floor wax.  Do not have throw rugs and other things on the floor that can make you trip. What can I do with my stairs?  Do not leave any items on the stairs.  Make sure that there are handrails on both sides of the stairs and use them. Fix handrails that are broken or loose. Make sure that handrails are as long as the stairways.  Check any carpeting to make sure that it is firmly attached to the stairs. Fix any carpet that is loose or worn.  Avoid having throw rugs at the top or bottom of the stairs. If you do have throw rugs, attach them to the floor with carpet tape.  Make sure that you have a light switch at the top of the stairs and the  bottom of the stairs. If you do not have them, ask someone to add them for you. What else can I do to help prevent falls?  Wear shoes that: ? Do not have high heels. ? Have rubber bottoms. ? Are comfortable and fit you well. ? Are closed at the toe. Do not wear sandals.  If you use a stepladder: ? Make sure that it is fully opened. Do not climb a closed stepladder. ? Make sure that both sides of the stepladder are locked into place. ? Ask someone to hold it for you, if possible.  Clearly mark and make sure that you can see: ? Any grab bars or handrails. ? First and last steps. ? Where the edge of each step is.  Use tools that help you move around (mobility aids) if they are needed. These include: ? Canes. ? Walkers. ? Scooters. ? Crutches.  Turn on the lights when you go into a dark area. Replace any light bulbs as soon as they burn out.  Set up your  furniture so you have a clear path. Avoid moving your furniture around.  If any of your floors are uneven, fix them.  If there are any pets around you, be aware of where they are.  Review your medicines with your doctor. Some medicines can make you feel dizzy. This can increase your chance of falling. Ask your doctor what other things that you can do to help prevent falls. This information is not intended to replace advice given to you by your health care provider. Make sure you discuss any questions you have with your health care provider. Document Released: 08/02/2009 Document Revised: 03/13/2016 Document Reviewed: 11/10/2014 Elsevier Interactive Patient Education  [employer].

## 2018-01-19 LAB — MICROALBUMIN / CREATININE URINE RATIO
Creatinine, Urine: 49.1 mg/dL
Microalb/Creat Ratio: <6.1 mg/g creat (ref 0.0–30.0)
Microalbumin, Urine: <3 ug/mL

## 2018-02-15 ENCOUNTER — Ambulatory Visit: Payer: 59 | Admitting: Pediatrics

## 2018-06-11 ENCOUNTER — Encounter: Payer: Self-pay | Admitting: Family Medicine

## 2018-06-11 ENCOUNTER — Ambulatory Visit (INDEPENDENT_AMBULATORY_CARE_PROVIDER_SITE_OTHER): Payer: 59 | Admitting: Family Medicine

## 2018-06-11 VITALS — BP 128/79 | HR 110 | Temp 97.4°F | Ht 61.0 in | Wt 219.0 lb

## 2018-06-11 DIAGNOSIS — N3000 Acute cystitis without hematuria: Secondary | ICD-10-CM | POA: Diagnosis not present

## 2018-06-11 DIAGNOSIS — R300 Dysuria: Secondary | ICD-10-CM | POA: Diagnosis not present

## 2018-06-11 LAB — URINALYSIS
Bilirubin, UA: NEGATIVE
Ketones, UA: NEGATIVE
NITRITE UA: NEGATIVE
Specific Gravity, UA: 1.02 (ref 1.005–1.030)
UUROB: 0.2 mg/dL (ref 0.2–1.0)
pH, UA: 7 (ref 5.0–7.5)

## 2018-06-11 MED ORDER — SULFAMETHOXAZOLE-TRIMETHOPRIM 800-160 MG PO TABS: 1.0000 | ORAL_TABLET | Freq: Two times a day (BID) | ORAL | 0 refills | Status: DC

## 2018-06-11 NOTE — Progress Notes (Signed)
BP 128/79 (BP Location: Right Arm)   Pulse (!) 110   Temp (!) 97.4 F (36.3 C) (Oral)   Ht 5\' 1"  (1.549 m)   Wt 219 lb (99.3 kg)   BMI 41.38 kg/m    Subjective:    Patient ID: Casey Sanders, female    DOB: 02-22-1961, 57 y.o.   MRN: [id number]  HPI: Casey Sanders is a 57 y.o. female presenting on 06/11/2018 for Urinary Tract Infection   HPI Dysuria and frequency and urgency Patient comes in complaining of dysuria urgency and frequency that is been going on for the past 2 days.  She says she has had this before when she is had a urinary tract infection and is been pretty bad at times before and she had to be hospitalized once for sepsis because of this.  This time just started up 2 days ago and she complains mostly of the dysuria and urgency and frequency but she does feel like it has been worsening over the past couple days.  She is also started developed some flank pain along with it.  Relevant past medical, surgical, family and social history reviewed and updated as indicated. Interim medical history since our last visit reviewed. Allergies and medications reviewed and updated.  Review of Systems  Constitutional: Negative for chills and fever.  Eyes: Negative for visual disturbance.  Respiratory: Negative for chest tightness and shortness of breath.   Cardiovascular: Negative for chest pain and leg swelling.  Gastrointestinal: Positive for abdominal pain.  Genitourinary: Positive for dysuria, flank pain, frequency and urgency. Negative for difficulty urinating, hematuria, vaginal bleeding, vaginal discharge and vaginal pain.  Musculoskeletal: Negative for back pain and gait problem.  Skin: Negative for rash.  Neurological: Negative for light-headedness and headaches.  Psychiatric/Behavioral: Negative for agitation and behavioral problems.  All other systems reviewed and are negative.   Per HPI unless specifically indicated above   Allergies as of 06/11/2018      Reactions     Codeine Hives, Nausea And Vomiting, Other (See Comments)   Jardiance [empagliflozin]    Caused blisters   Metformin And Related    nausea   Niaspan [niacin Er] Other (See Comments)   Increase blood glucose      Medication List        Accurate as of 06/11/18  5:30 PM. Always use your most recent med list.          albuterol 108 (90 Base) MCG/ACT inhaler Commonly known as:  PROVENTIL HFA;VENTOLIN HFA Inhale 1 puff into the lungs every 6 (six) hours as needed for wheezing or shortness of breath.   amitriptyline 25 MG tablet Commonly known as:  ELAVIL Take one or two tablets at bedtime.   aspirin 81 MG EC tablet Take 81 mg by mouth daily.   atorvastatin 80 MG tablet Commonly known as:  LIPITOR TAKE 1 TABLET BY MOUTH ONCE DAILY AT  6  PM   clonazePAM 1 MG tablet Commonly known as:  KLONOPIN Take 1 mg by mouth at bedtime.   cyclobenzaprine 10 MG tablet Commonly known as:  FLEXERIL Take 10 mg by mouth 3 (three) times daily as needed for muscle spasms.   diclofenac sodium 1 % Gel Commonly known as:  VOLTAREN Apply 2 g topically 4 (four) times daily.   Dulaglutide 1.5 MG/0.5ML Sopn INJECT 1 DOSE SUBQ ONCE WEEKLY   FINGERSTIX LANCETS Misc Use to check BG up to bid.  Dx:  250.00   fish oil-omega-3 fatty acids  1000 MG capsule Take 2 g by mouth daily.   gabapentin 400 MG capsule Commonly known as:  NEURONTIN Take 1 capsule (400 mg total) by mouth 4 (four) times daily.   glimepiride 4 MG tablet Commonly known as:  AMARYL Take 1 tablet (4 mg total) by mouth 2 (two) times daily.   glucose blood test strip Use to check BG up to bid.  Dx:  250.00   Insulin Glargine 100 UNIT/ML Solostar Pen Commonly known as:  LANTUS Inject 30 Units into the skin daily at 10 pm.   lidocaine 5 % Commonly known as:  LIDODERM Place 3 patches onto the skin daily. Apply up to 3 patches to area of pain for 12 hour, then Remove & Discard patch.  May reapply patch(es) after being patch  free for 12 hours.   lisinopril 10 MG tablet Commonly known as:  PRINIVIL,ZESTRIL Take 1 tablet (10 mg total) by mouth daily.   sertraline 100 MG tablet Commonly known as:  ZOLOFT Take 100 mg by mouth 2 (two) times daily.   sulfamethoxazole-trimethoprim 800-160 MG tablet Commonly known as:  BACTRIM DS,SEPTRA DS Take 1 tablet by mouth 2 (two) times daily.   traMADol 50 MG tablet Commonly known as:  ULTRAM Take 50 mg by mouth every 6 (six) hours as needed.   Vitamin D (Ergocalciferol) 50000 units Caps capsule Commonly known as:  DRISDOL Take 1 capsule (50,000 Units total) by mouth every 7 (seven) days.          Objective:    BP 128/79 (BP Location: Right Arm)   Pulse (!) 110   Temp (!) 97.4 F (36.3 C) (Oral)   Ht 5\' 1"  (1.549 m)   Wt 219 lb (99.3 kg)   BMI 41.38 kg/m   Wt Readings from Last 3 Encounters:  06/11/18 219 lb (99.3 kg)  01/18/18 230 lb (104.3 kg)  11/19/17 229 lb 12.8 oz (104.2 kg)    Physical Exam  Constitutional: She is oriented to person, place, and time. She appears well-developed and well-nourished. No distress.  Eyes: Conjunctivae are normal.  Cardiovascular: Normal rate, regular rhythm, normal heart sounds and intact distal pulses.  No murmur heard. Pulmonary/Chest: Effort normal and breath sounds normal. No respiratory distress. She has no wheezes.  Abdominal: Soft. Bowel sounds are normal. She exhibits no distension and no mass. There is tenderness in the suprapubic area. There is no rigidity, no rebound, no guarding and no CVA tenderness.  Neurological: She is alert and oriented to person, place, and time. Coordination normal.  Skin: Skin is warm and dry. No rash noted. She is not diaphoretic.  Psychiatric: She has a normal mood and affect. Her behavior is normal.  Nursing note and vitals reviewed.   Urinalysis, dip only: 1+ leukocytes, 2+ glucose, 3+ blood, 2+ protein    Assessment & Plan:   Problem List Items Addressed This Visit     None    Visit Diagnoses    Acute cystitis without hematuria    -  Primary   Relevant Medications   sulfamethoxazole-trimethoprim (BACTRIM DS,SEPTRA DS) 800-160 MG tablet   Other Relevant Orders   Urinalysis   Urine Culture    Gave samples for Trulicity because she does not have insurance currently  Follow up plan: Return if symptoms worsen or fail to improve.  Counseling provided for all of the vaccine components Orders Placed This Encounter  Procedures  . Urine Culture  . Urinalysis    Caryl Pina, MD [facility] 06/11/2018, 5:30 PM

## 2018-06-13 LAB — URINE CULTURE

## 2018-07-27 ENCOUNTER — Telehealth: Payer: Self-pay | Admitting: Family Medicine

## 2018-07-27 NOTE — Telephone Encounter (Signed)
Samples ready for pick up-patient aware.

## 2018-08-27 ENCOUNTER — Other Ambulatory Visit: Payer: Self-pay | Admitting: Pediatrics

## 2018-08-27 DIAGNOSIS — E118 Type 2 diabetes mellitus with unspecified complications: Secondary | ICD-10-CM

## 2018-09-09 ENCOUNTER — Ambulatory Visit: Payer: Medicaid Other | Admitting: Family

## 2018-09-09 ENCOUNTER — Ambulatory Visit: Payer: 59

## 2018-09-09 ENCOUNTER — Encounter: Payer: Self-pay | Admitting: Family

## 2018-09-09 VITALS — BP 119/71 | HR 102 | Temp 97.9°F | Ht 61.0 in | Wt 216.0 lb

## 2018-09-09 DIAGNOSIS — N3001 Acute cystitis with hematuria: Secondary | ICD-10-CM | POA: Diagnosis not present

## 2018-09-09 DIAGNOSIS — R300 Dysuria: Secondary | ICD-10-CM | POA: Diagnosis not present

## 2018-09-09 LAB — URINALYSIS, COMPLETE
Bilirubin, UA: NEGATIVE
Ketones, UA: NEGATIVE
NITRITE UA: NEGATIVE
PH UA: 7 (ref 5.0–7.5)
Protein, UA: NEGATIVE
Specific Gravity, UA: 1.015 (ref 1.005–1.030)
Urobilinogen, Ur: 1 mg/dL (ref 0.2–1.0)

## 2018-09-09 LAB — MICROSCOPIC EXAMINATION
Bacteria, UA: NONE SEEN
WBC, UA: >30 /hpf — AB (ref 0–5)

## 2018-09-09 MED ORDER — CEPHALEXIN 500 MG PO CAPS: 500.0000 mg | ORAL_CAPSULE | Freq: Two times a day (BID) | ORAL | 0 refills | Status: DC

## 2018-09-09 NOTE — Patient Instructions (Signed)

## 2018-09-09 NOTE — Progress Notes (Signed)
   Subjective:    Patient ID: Casey Sanders, female    DOB: 21-Jun-1961, 57 y.o.   MRN: [id number]  Chief Complaint  Patient presents with  . Dysuria    x 2 days    Urinary Tract Infection   This is a new problem. The problem occurs every urination. The problem has been gradually worsening. The quality of the pain is described as burning. The pain is at a severity of 8/10. The pain is moderate. Associated symptoms include frequency, hematuria, hesitancy and urgency. Pertinent negatives include no nausea or vomiting. She has tried increased fluids for the symptoms. The treatment provided mild relief.      Review of Systems  Gastrointestinal: Negative for nausea and vomiting.  Genitourinary: Positive for frequency, hematuria, hesitancy and urgency.  All other systems reviewed and are negative.      Objective:   Physical Exam  Constitutional: She is oriented to person, place, and time. She appears well-developed and well-nourished. No distress.  HENT:  Head: Normocephalic and atraumatic.  Right Ear: External ear normal.  Mouth/Throat: Oropharynx is clear and moist.  Eyes: Pupils are equal, round, and reactive to light.  Neck: Normal range of motion. Neck supple. No thyromegaly present.  Cardiovascular: Normal rate, regular rhythm, normal heart sounds and intact distal pulses.  No murmur heard. Pulmonary/Chest: Effort normal and breath sounds normal. No respiratory distress. She has no wheezes.  Abdominal: Soft. Bowel sounds are normal. She exhibits no distension. There is no tenderness (mild lower abd tenderness).  Musculoskeletal:  Mild right flank pain, generalized weakness, using rolling walker  Neurological: She is alert and oriented to person, place, and time. She has normal reflexes. No cranial nerve deficit.  Skin: Skin is warm and dry.  Psychiatric: She has a normal mood and affect. Her behavior is normal. Judgment and thought content normal.  Vitals reviewed.    BP  119/71   Pulse (!) 102   Temp 97.9 F (36.6 C) (Oral)   Ht 5\' 1"  (1.549 m)   Wt 216 lb (98 kg)   BMI 40.81 kg/m       Assessment & Plan:  Casey Sanders comes in today with chief complaint of Dysuria (x 2 days)   Diagnosis and orders addressed:  1. Dysuria - Urinalysis, Complete  2. Acute cystitis with hematuria Force fluids AZO over the counter X2 days RTO prn Culture pending - cephALEXin (KEFLEX) 500 MG capsule; Take 1 capsule (500 mg total) by mouth 2 (two) times daily.  Dispense: 14 capsule; Refill: 0 - Urine Culture   Evelina Dun, FNP

## 2018-09-10 LAB — URINE CULTURE

## 2018-09-30 ENCOUNTER — Other Ambulatory Visit: Payer: Self-pay | Admitting: Pediatrics

## 2018-09-30 DIAGNOSIS — E118 Type 2 diabetes mellitus with unspecified complications: Secondary | ICD-10-CM

## 2018-10-05 ENCOUNTER — Other Ambulatory Visit: Payer: Self-pay | Admitting: Pediatrics

## 2018-10-05 DIAGNOSIS — E118 Type 2 diabetes mellitus with unspecified complications: Secondary | ICD-10-CM

## 2018-10-28 ENCOUNTER — Ambulatory Visit: Payer: Medicaid Other | Admitting: Pediatrics

## 2018-11-11 ENCOUNTER — Ambulatory Visit: Payer: Medicaid Other | Admitting: Family Medicine

## 2018-11-11 ENCOUNTER — Encounter: Payer: Self-pay | Admitting: Family Medicine

## 2018-11-11 ENCOUNTER — Ambulatory Visit: Payer: Medicaid Other | Admitting: Pediatrics

## 2018-11-11 VITALS — BP 133/78 | HR 103 | Temp 98.3°F | Ht 61.0 in | Wt 212.4 lb

## 2018-11-11 DIAGNOSIS — R300 Dysuria: Secondary | ICD-10-CM

## 2018-11-11 DIAGNOSIS — N3001 Acute cystitis with hematuria: Secondary | ICD-10-CM | POA: Diagnosis not present

## 2018-11-11 DIAGNOSIS — H66009 Acute suppurative otitis media without spontaneous rupture of ear drum, unspecified ear: Secondary | ICD-10-CM | POA: Diagnosis not present

## 2018-11-11 DIAGNOSIS — F4321 Adjustment disorder with depressed mood: Secondary | ICD-10-CM | POA: Diagnosis not present

## 2018-11-11 LAB — URINALYSIS
Bilirubin, UA: NEGATIVE
KETONES UA: NEGATIVE
Nitrite, UA: NEGATIVE
SPEC GRAV UA: 1.015 (ref 1.005–1.030)
UUROB: 0.2 mg/dL (ref 0.2–1.0)
pH, UA: 6.5 (ref 5.0–7.5)

## 2018-11-11 MED ORDER — LEVOFLOXACIN 500 MG PO TABS: 500.0000 mg | ORAL_TABLET | Freq: Every day | ORAL | 0 refills | Status: DC

## 2018-11-11 NOTE — Progress Notes (Signed)
Chief Complaint  Patient presents with  . Ear Pain    pt here today c/o right ear pain  . Dysuria    pt here today also c/o dysuria x 3 days    HPI  Patient presents today for Symptoms include congestion,bilateral ear pain, nasal congestion, non productive cough,here is no fever, chills, or sweats. Feeling off balance when first standing up. Onset of symptoms was about 4 days ago, gradually worsening since that time.  burning with urination and frequency for3 days. Denies fever . No flank pain. No nausea, vomiting.  Pt. Tearfully related her husbands death. Recalls the events of that day and the 40th anniversary thatprecedeed it by a few days. They were the same age. Together since middle school. Seeing psychiatry for grief reaction. MEds adjusted there.  PMH: Smoking status noted TKP:TWSFKC of Systems  Constitutional: Negative for chills, diaphoresis, fever and weight loss.  HENT: Positive for congestion and ear pain. Negative for ear discharge, hearing loss and sore throat.   Eyes: Negative.   Respiratory: Negative for cough, sputum production and shortness of breath.   Cardiovascular: Negative for chest pain and palpitations.  Gastrointestinal: Negative for nausea and vomiting.  Genitourinary: Positive for dysuria, frequency and urgency. Negative for flank pain.  Skin: Negative for rash.  Neurological: Positive for dizziness.  Psychiatric/Behavioral: Negative.     Objective: BP 133/78   Pulse (!) 103   Temp 98.3 F (36.8 C) (Oral)   Ht 5\' 1"  (1.549 m)   Wt 212 lb 6 oz (96.3 kg)   BMI 40.13 kg/m  Gen: NAD, alert, cooperative with exam HEENT: NCAT, EOMI, PERRL. Fluid behind right TM CV: RRR, good S1/S2, no murmur Resp: CTABL, no wheezes, non-labored Abd: SNTND, BS present, no guarding or organomegaly Ext: No edema, warm Neuro: Alert and oriented, No gross deficits  Assessment and plan:  1. Dysuria   2. Acute suppurative otitis media without spontaneous rupture of ear  drum, recurrence not specified, unspecified laterality   3. Acute cystitis with hematuria   4. Grief reaction     No orders of the defined types were placed in this encounter.   Orders Placed This Encounter  Procedures  . Urine Culture  . Urinalysis    Follow up as needed.  Claretta Fraise, MD

## 2018-11-13 LAB — URINE CULTURE

## 2018-12-06 DIAGNOSIS — H524 Presbyopia: Secondary | ICD-10-CM | POA: Diagnosis not present

## 2018-12-06 DIAGNOSIS — H52223 Regular astigmatism, bilateral: Secondary | ICD-10-CM | POA: Diagnosis not present

## 2018-12-06 DIAGNOSIS — H5203 Hypermetropia, bilateral: Secondary | ICD-10-CM | POA: Diagnosis not present

## 2018-12-06 DIAGNOSIS — H04123 Dry eye syndrome of bilateral lacrimal glands: Secondary | ICD-10-CM | POA: Diagnosis not present

## 2018-12-06 LAB — HM DIABETES EYE EXAM

## 2018-12-08 ENCOUNTER — Other Ambulatory Visit: Payer: Self-pay | Admitting: Pediatrics

## 2018-12-08 DIAGNOSIS — E118 Type 2 diabetes mellitus with unspecified complications: Secondary | ICD-10-CM

## 2018-12-08 NOTE — Telephone Encounter (Signed)
Apt scheduled.  

## 2018-12-08 NOTE — Telephone Encounter (Signed)
[place]ottsch[place]. NTBS 30 days given 10/06/18

## 2018-12-14 ENCOUNTER — Encounter: Payer: Self-pay | Admitting: Family Medicine

## 2018-12-14 ENCOUNTER — Ambulatory Visit (INDEPENDENT_AMBULATORY_CARE_PROVIDER_SITE_OTHER): Payer: Medicaid Other

## 2018-12-14 ENCOUNTER — Ambulatory Visit: Payer: Medicaid Other | Admitting: Family Medicine

## 2018-12-14 VITALS — BP 127/79 | HR 98 | Temp 98.1°F | Ht 61.0 in | Wt 211.0 lb

## 2018-12-14 DIAGNOSIS — I10 Essential (primary) hypertension: Secondary | ICD-10-CM | POA: Insufficient documentation

## 2018-12-14 DIAGNOSIS — E1149 Type 2 diabetes mellitus with other diabetic neurological complication: Secondary | ICD-10-CM | POA: Diagnosis not present

## 2018-12-14 DIAGNOSIS — E1142 Type 2 diabetes mellitus with diabetic polyneuropathy: Secondary | ICD-10-CM

## 2018-12-14 DIAGNOSIS — E785 Hyperlipidemia, unspecified: Secondary | ICD-10-CM | POA: Diagnosis not present

## 2018-12-14 DIAGNOSIS — M25561 Pain in right knee: Secondary | ICD-10-CM

## 2018-12-14 DIAGNOSIS — E1169 Type 2 diabetes mellitus with other specified complication: Secondary | ICD-10-CM

## 2018-12-14 DIAGNOSIS — I152 Hypertension secondary to endocrine disorders: Secondary | ICD-10-CM | POA: Insufficient documentation

## 2018-12-14 DIAGNOSIS — E1159 Type 2 diabetes mellitus with other circulatory complications: Secondary | ICD-10-CM | POA: Diagnosis not present

## 2018-12-14 DIAGNOSIS — S8991XA Unspecified injury of right lower leg, initial encounter: Secondary | ICD-10-CM | POA: Diagnosis not present

## 2018-12-14 LAB — BAYER DCA HB A1C WAIVED

## 2018-12-14 IMAGING — DX DG KNEE 1-2V*R*
2 series · 2 of 2 positions shown · non-contrast
Comparison: None.

CLINICAL DATA: Fell last week, continued RIGHT knee pain.

EXAM:
RIGHT KNEE - 1-2 VIEW

[knee ap]
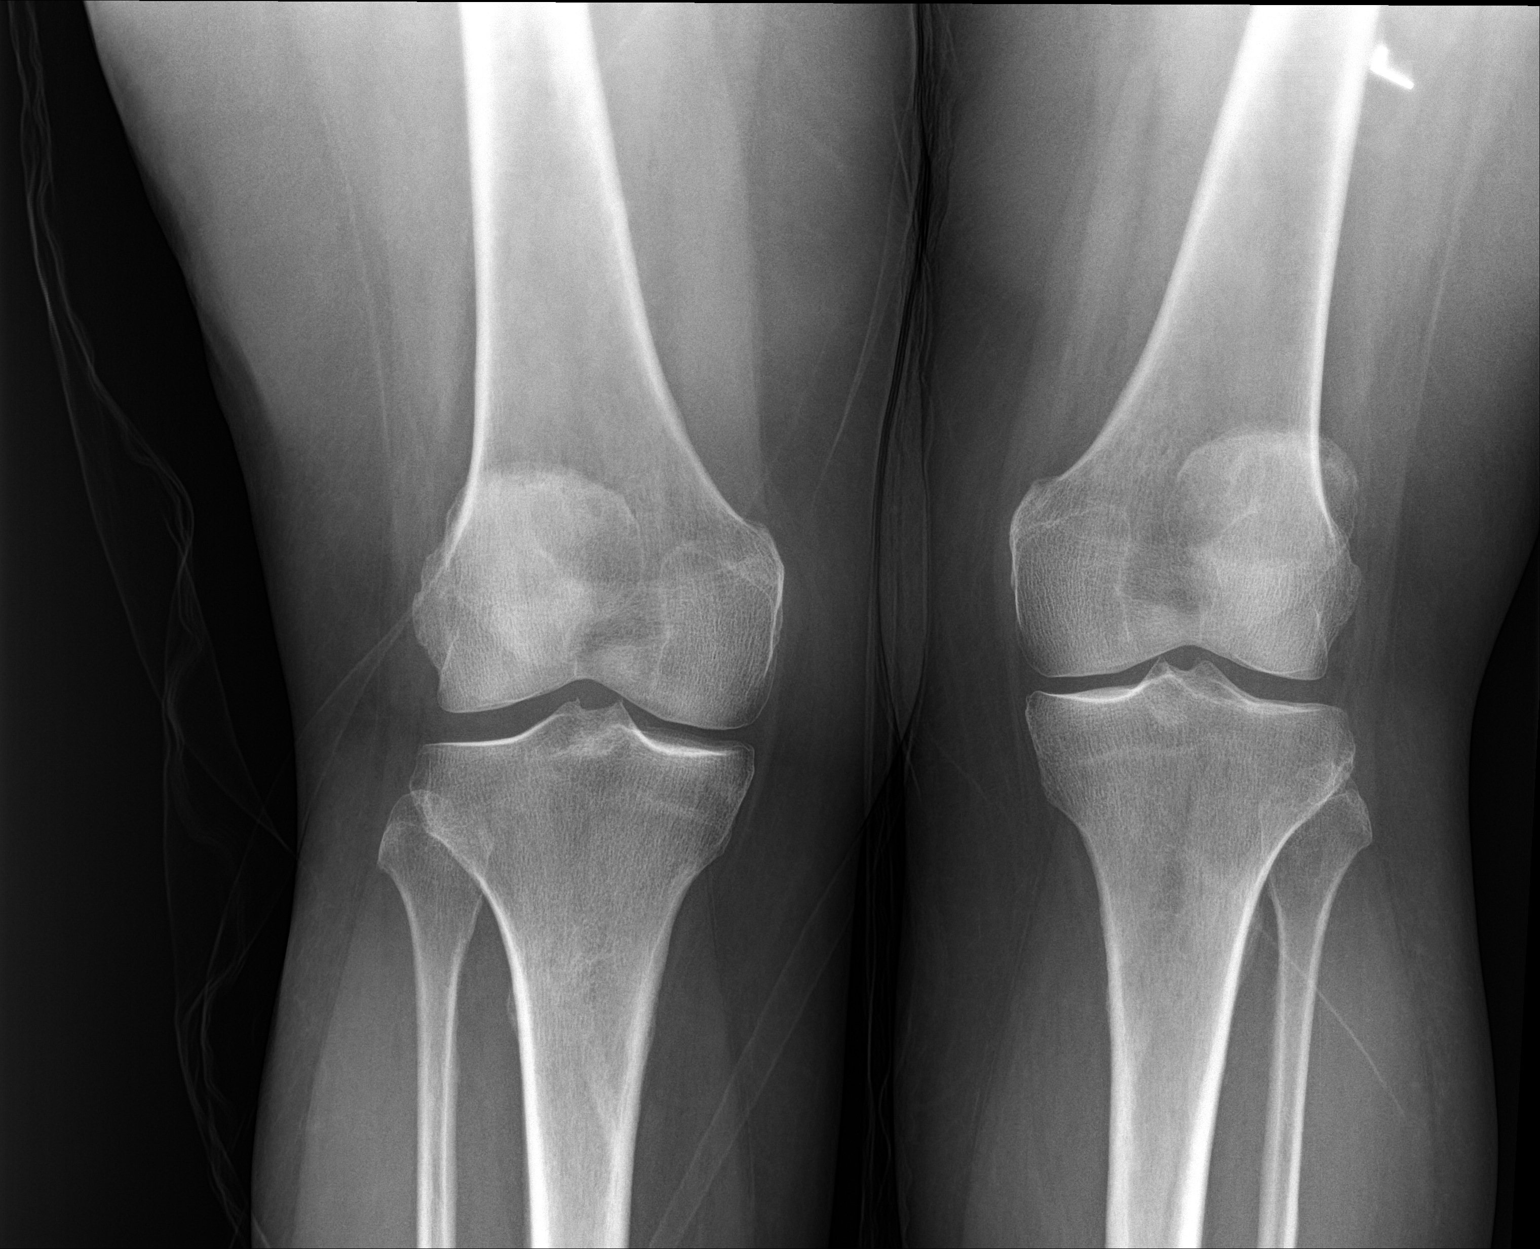

[knee lat]
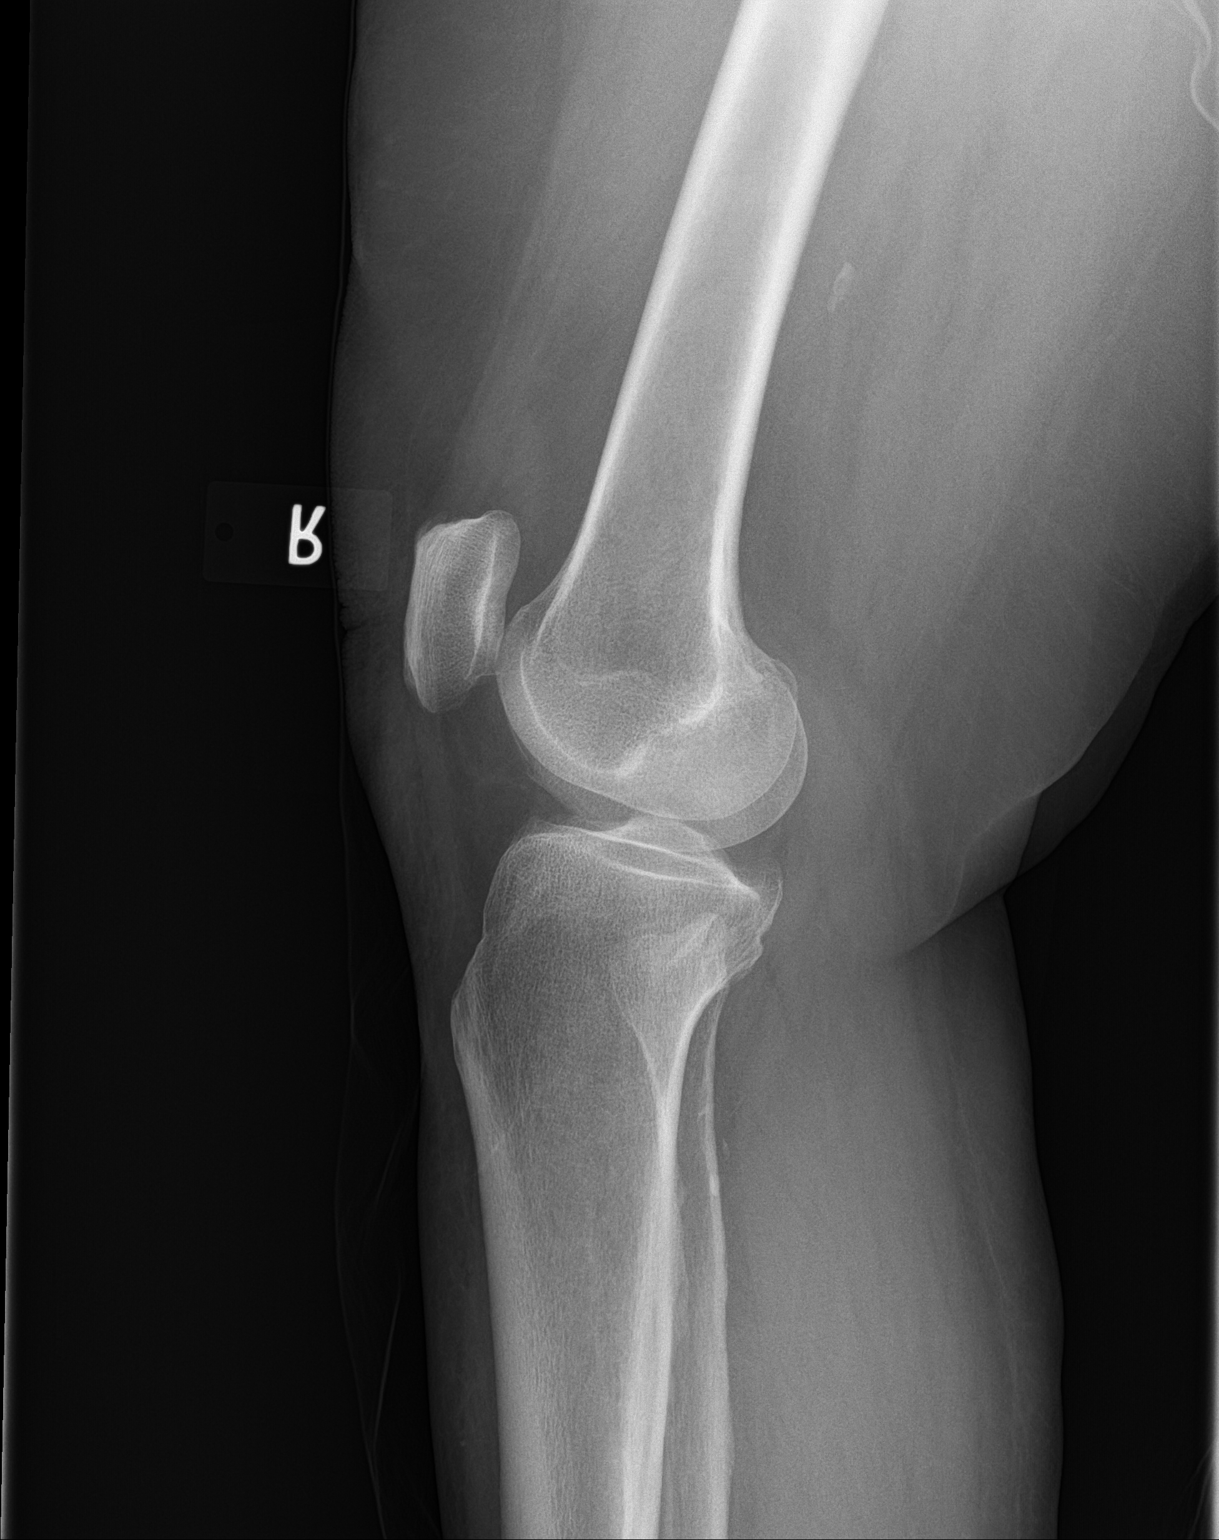

[2 of 2 positions shown; findings below may reference images not displayed]

FINDINGS: Osseous alignment is normal. Bone mineralization is normal. No
fracture line or displaced fracture fragment seen. No degenerative
change. No appreciable joint effusion and adjacent soft tissues are
unremarkable.
IMPRESSION: Negative.

## 2018-12-14 NOTE — Progress Notes (Signed)
Subjective: CC: DM2 PCP: [name], DO ASN:[id number] Casey Sanders is a 58 y.o. female presenting to clinic today for:  1. Type 2 Diabetes w/ HTN, HLD, polyneuropathy:  Patient reports: Fasting blood sugars typically greater than 200.  High at home: 390s; Low at home: 200s, Prescribed medication(s): Trulicity (not been on H4L due to cost), Amaryl 4 mg, atorvastatin 80 mg and Lantus 35 units.  She also takes lisinopril 10 mg daily for blood pressure.  She notes that she was lost to follow-up because her husband passed away in 05/20/2023 of last year.  She has been struggling financially and emotionally with this.  She is currently seen by a specialist for mood.  Last eye exam: UTD Last foot exam: UTD Last A1c:  Lab Results  Component Value Date   HGBA1C 10.7 (H) 01/18/2018   Nephropathy screen indicated?: on ARB Last flu, zoster and/or pneumovax:  Immunization History  Administered Date(s) Administered  . Influenza Whole 10/20/2006  . Influenza,inj,Quad PF,6+ Mos 08/22/2013, 09/07/2014, 07/15/2016, 08/20/2017  . Pneumococcal Conjugate-13 09/07/2014  . Pneumococcal Polysaccharide-23 07/15/2016  . Td 10/20/2002    ROS: Denies dizziness, LOC, unintended weight loss/gain.  She reports chronic numbness or tingling in extremities.  He has been experiencing polyuria, polydipsia.  No CP/ shortness of breath.  2. Knee pain Patient reports acute onset of right knee pain after a fall onto her knee.  She has that she was chasing her grandson around and fell onto the right knee.  At baseline, she uses a cane for ambulation.  She has polyneuropathy as above.  She notes bruising and point tenderness to the medial aspect of the right anterior knee.  She is able to ambulate on the knee but notes that it hurts quite a bit.  ROS: Per HPI  Allergies  Allergen Reactions  . Codeine Hives, Nausea And Vomiting and Other (See Comments)  . Jardiance [Empagliflozin]     Caused blisters  . Metformin And  Related     nausea  . Niaspan [Niacin Er] Other (See Comments)    Increase blood glucose   Past Medical History:  Diagnosis Date  . Allergic rhinitis   . Anxiety and depression   . Asthma   . Chest pain    a. Myoview 9/05: no ischemia, no scar, EF 70%  . Diverticulosis   . DM2 (diabetes mellitus, type 2) ([place])   . Fibromyalgia   . Frequent UTI   . GERD (gastroesophageal reflux disease)    Hiatal Hernia  . HLD (hyperlipidemia)   . HTN (hypertension)   . Left rotator cuff tear   . Lumbar disc disease   . Nephrolithiasis   . Obesity   . Palpitations    monitor 9/05: NSR, sinus tachy, PVCs    Current Outpatient Medications:  .  albuterol (VENTOLIN HFA) 108 (90 Base) MCG/ACT inhaler, Inhale 1 puff into the lungs every 6 (six) hours as needed for wheezing or shortness of breath., Disp: 1 each, Rfl: 1 .  amitriptyline (ELAVIL) 25 MG tablet, Take one or two tablets at bedtime. (Patient taking differently: Take 25-50 mg by mouth at bedtime. ), Disp: 60 tablet, Rfl: 0 .  aspirin 81 MG EC tablet, Take 81 mg by mouth daily.  , Disp: , Rfl:  .  atorvastatin (LIPITOR) 80 MG tablet, TAKE 1 TABLET BY MOUTH ONCE DAILY AT  6  PM, Disp: 90 tablet, Rfl: 3 .  clonazePAM (KLONOPIN) 2 MG tablet, Take 2 mg by mouth 2 (  two) times daily., Disp: , Rfl:  .  cyclobenzaprine (FLEXERIL) 10 MG tablet, Take 10 mg by mouth 3 (three) times daily as needed for muscle spasms. , Disp: , Rfl: 1 .  diclofenac sodium (VOLTAREN) 1 % GEL, Apply 2 g topically 4 (four) times daily., Disp: 100 g, Rfl: 1 .  Dulaglutide (TRULICITY) 1.5 HE/1.7EY SOPN, INJECT 1 DOSE SUBQ ONCE WEEKLY, Disp: 12 pen, Rfl: 1 .  FINGERSTIX LANCETS MISC, Use to check BG up to bid.  Dx:  250.00, Disp: 100 each, Rfl: 1 .  fish oil-omega-3 fatty acids 1000 MG capsule, Take 2 g by mouth daily.  , Disp: , Rfl:  .  gabapentin (NEURONTIN) 400 MG capsule, Take 1 capsule (400 mg total) by mouth 4 (four) times daily., Disp: 360 capsule, Rfl: 1 .  glimepiride  (AMARYL) 4 MG tablet, TAKE 1 TABLET BY MOUTH TWICE DAILY MUST  BE  SEEN  FOR  REFILLS, Disp: 60 tablet, Rfl: 1 .  glucose blood (TRUETRACK TEST) test strip, Use to check BG up to bid.  Dx:  250.00, Disp: 100 each, Rfl: 1 .  Insulin Glargine (LANTUS SOLOSTAR) 100 UNIT/ML Solostar Pen, Inject 30 Units into the skin daily at 10 pm., Disp: 5 pen, Rfl: PRN .  levofloxacin (LEVAQUIN) 500 MG tablet, Take 1 tablet (500 mg total) by mouth daily. For 10 days, Disp: 10 tablet, Rfl: 0 .  lidocaine (LIDODERM) 5 %, Place 3 patches onto the skin daily. Apply up to 3 patches to area of pain for 12 hour, then Remove & Discard patch.  May reapply patch(es) after being patch free for 12 hours., Disp: 270 patch, Rfl: 0 .  lisinopril (PRINIVIL,ZESTRIL) 10 MG tablet, Take 1 tablet (10 mg total) by mouth daily., Disp: 90 tablet, Rfl: 3 .  sertraline (ZOLOFT) 100 MG tablet, Take 100 mg by mouth 2 (two) times daily. , Disp: , Rfl:  .  traMADol (ULTRAM) 50 MG tablet, Take 50 mg by mouth every 6 (six) hours as needed. , Disp: , Rfl: 1 .  Vitamin D, Ergocalciferol, (DRISDOL) 50000 units CAPS capsule, Take 1 capsule (50,000 Units total) by mouth every 7 (seven) days., Disp: 8 capsule, Rfl: 0 Social History   Socioeconomic History  . Marital status: Married    Spouse name: Not on file  . Number of children: 2  . Years of education: Not on file  . Highest education level: Not on file  Occupational History  . Occupation: disabled  Social Needs  . Financial resource strain: Not on file  . Food insecurity:    Worry: Not on file    Inability: Not on file  . Transportation needs:    Medical: Not on file    Non-medical: Not on file  Tobacco Use  . Smoking status: Former Research scientist (life sciences)  . Smokeless tobacco: Never Used  Substance and Sexual Activity  . Alcohol use: No  . Drug use: No  . Sexual activity: Not on file  Lifestyle  . Physical activity:    Days per week: Not on file    Minutes per session: Not on file  . Stress:  Not on file  Relationships  . Social connections:    Talks on phone: Not on file    Gets together: Not on file    Attends religious service: Not on file    Active member of club or organization: Not on file    Attends meetings of clubs or organizations: Not on file  Relationship status: Not on file  . Intimate partner violence:    Fear of current or ex partner: Not on file    Emotionally abused: Not on file    Physically abused: Not on file    Forced sexual activity: Not on file  Other Topics Concern  . Not on file  Social History Narrative  . Not on file   Family History  Problem Relation Age of Onset  . Coronary artery disease Mother   . Coronary artery disease Father     Objective: Office vital signs reviewed. BP 127/79   Pulse 98   Temp 98.1 F (36.7 C) (Oral)   Ht _0  (1.549 m)   Wt 211 lb (95.7 kg)   BMI 39.87 kg/m   Physical Examination:  General: Awake, alert, No acute distress HEENT: Normal, sclera white.  MMM Cardio: regular rate and rhythm, S1S2 heard, no murmurs appreciated Pulm: clear to auscultation bilaterally, no wheezes, rhonchi or rales; normal work of breathing on room air Extremities: warm, well perfused, No edema, cyanosis or clubbing; +2 pulses bilaterally MSK: antalgic gait and station; uses cane for ambulation  Right knee: She has quite a bit of bruising along the anterior aspect of the right knee and right lateral thigh.  No appreciable hematoma.  She has point tenderness to the medial aspect of the patella.  No appreciable ligamentous laxity. Skin: Ecchymosis as above  Dg Knee 1-2 Views Right  Result Date: 12/14/2018 CLINICAL DATA:  [place] last week, continued RIGHT knee pain. EXAM: RIGHT KNEE - 1-2 VIEW COMPARISON:  None. FINDINGS: Osseous alignment is normal. Bone mineralization is normal. No fracture line or displaced fracture fragment seen. No degenerative change. No appreciable joint effusion and adjacent soft tissues are unremarkable.  IMPRESSION: Negative. Electronically Signed   By: Franki Cabot M.D.   On: 12/14/2018 09:53    Assessment/ Plan: 58 y.o. female   1. Type 2 diabetes mellitus with other neurologic complication, without long-term current use of insulin (HCC) Grossly uncontrolled with A1c of greater than 14 today.  Unfortunately, Trulicity is no longer covered under her insurance since she is transitioned over to [facility].  I have advised her to increase her Lantus by 1 unit every 2 days for fasting blood sugars greater than 140.  This was reiterated on her AVS today.  We will plan to reconvene in about 4 to 6 weeks just to see how fasting blood sugars are trending with repeat A1c in about 3 months. - Bayer DCA Hb A1c Waived - CMP14+EGFR  2. Hypertension associated with diabetes ([place]) Controlled.  Continue current regimen - CMP14+EGFR  3. Hyperlipidemia associated with type 2 diabetes mellitus (HCC) Continue statin - Lipid Panel - TSH  4. Diabetic polyneuropathy associated with type 2 diabetes mellitus ([place])  5. Acute pain of right knee Given point tenderness at proportion to exam, x-ray was obtained.  This was negative for fracture. - DG Knee 1-2 Views Right; Future   Orders Placed This Encounter  Procedures  . DG Knee 1-2 Views Right    Standing Status:   Future    Number of Occurrences:   1    Standing Expiration Date:   02/13/2020    Order Specific Question:   Reason for Exam (SYMPTOM  OR DIAGNOSIS REQUIRED)    Answer:   fall    Order Specific Question:   Is the patient pregnant?    Answer:   No    Order Specific Question:   Preferred  imaging location?    Answer:   Internal  . Bayer DCA Hb A1c Waived  . CMP14+EGFR  . Lipid Panel  . TSH   No orders of the defined types were placed in this encounter.    [name], DO [place] [phone]

## 2018-12-14 NOTE — Patient Instructions (Addendum)
Please schedule your mammogram and colonoscopy  Trulicity is not covered by Medicaid  We will work on using Lantus to control your sugars instead.  I would like you to increase your Lantus by 1 unit every 2 days for fasting morning blood sugars higher than 140.  I would like to see you back again in 4-6 weeks for recheck of your sugar.  I will call you with your xray results.

## 2018-12-15 ENCOUNTER — Other Ambulatory Visit: Payer: Self-pay | Admitting: Family Medicine

## 2018-12-15 LAB — CMP14+EGFR
ALBUMIN: 4.3 g/dL (ref 3.8–4.9)
ALT: 37 IU/L — ABNORMAL HIGH (ref 0–32)
AST: 32 IU/L (ref 0–40)
Albumin/Globulin Ratio: 1.7 (ref 1.2–2.2)
Alkaline Phosphatase: 97 IU/L (ref 39–117)
BUN/Creatinine Ratio: 22 (ref 9–23)
BUN: 18 mg/dL (ref 6–24)
Bilirubin Total: 0.4 mg/dL (ref 0.0–1.2)
CO2: 24 mmol/L (ref 20–29)
Calcium: 9.9 mg/dL (ref 8.7–10.2)
Chloride: 93 mmol/L — ABNORMAL LOW (ref 96–106)
Creatinine, Ser: 0.83 mg/dL (ref 0.57–1.00)
GFR calc Af Amer: 89 mL/min/{1.73_m2} (ref >59)
GFR calc non Af Amer: 77 mL/min/{1.73_m2} (ref >59)
GLUCOSE: 437 mg/dL — AB (ref 65–99)
Globulin, Total: 2.5 g/dL (ref 1.5–4.5)
Potassium: 4.4 mmol/L (ref 3.5–5.2)
Sodium: 133 mmol/L — ABNORMAL LOW (ref 134–144)
Total Protein: 6.8 g/dL (ref 6.0–8.5)

## 2018-12-15 LAB — LIPID PANEL
Chol/HDL Ratio: 2.9 ratio (ref 0.0–4.4)
Cholesterol, Total: 143 mg/dL (ref 100–199)
HDL: 50 mg/dL (ref >39)
LDL Calculated: 67 mg/dL (ref 0–99)
Triglycerides: 132 mg/dL (ref 0–149)
VLDL Cholesterol Cal: 26 mg/dL (ref 5–40)

## 2018-12-15 LAB — TSH: TSH: 6.76 u[IU]/mL — ABNORMAL HIGH (ref 0.450–4.500)

## 2018-12-15 MED ORDER — LEVOTHYROXINE SODIUM 50 MCG PO TABS: 50.0000 ug | ORAL_TABLET | Freq: Every day | ORAL | 1 refills | Status: DC

## 2019-01-11 ENCOUNTER — Ambulatory Visit: Payer: Medicaid Other | Admitting: Family Medicine

## 2019-01-11 ENCOUNTER — Other Ambulatory Visit: Payer: Self-pay

## 2019-01-11 ENCOUNTER — Encounter (INDEPENDENT_AMBULATORY_CARE_PROVIDER_SITE_OTHER): Payer: Self-pay

## 2019-01-11 VITALS — BP 116/67 | HR 75 | Temp 97.5°F | Ht 61.0 in | Wt 215.0 lb

## 2019-01-11 DIAGNOSIS — E039 Hypothyroidism, unspecified: Secondary | ICD-10-CM | POA: Diagnosis not present

## 2019-01-11 DIAGNOSIS — R945 Abnormal results of liver function studies: Secondary | ICD-10-CM

## 2019-01-11 DIAGNOSIS — R7989 Other specified abnormal findings of blood chemistry: Secondary | ICD-10-CM

## 2019-01-11 DIAGNOSIS — B359 Dermatophytosis, unspecified: Secondary | ICD-10-CM

## 2019-01-11 DIAGNOSIS — E1149 Type 2 diabetes mellitus with other diabetic neurological complication: Secondary | ICD-10-CM | POA: Diagnosis not present

## 2019-01-11 DIAGNOSIS — J45909 Unspecified asthma, uncomplicated: Secondary | ICD-10-CM

## 2019-01-11 DIAGNOSIS — M25522 Pain in left elbow: Secondary | ICD-10-CM

## 2019-01-11 MED ORDER — SEMAGLUTIDE(0.25 OR 0.5MG/DOS) 2 MG/1.5ML ~~LOC~~ SOPN: 0.5000 mg | PEN_INJECTOR | SUBCUTANEOUS | 3 refills | Status: DC

## 2019-01-11 MED ORDER — CLOTRIMAZOLE 1 % EX CREA: 1.0000 "application " | TOPICAL_CREAM | Freq: Two times a day (BID) | CUTANEOUS | 1 refills | Status: DC

## 2019-01-11 MED ORDER — ALBUTEROL SULFATE HFA 108 (90 BASE) MCG/ACT IN AERS: 1.0000 | INHALATION_SPRAY | Freq: Four times a day (QID) | RESPIRATORY_TRACT | 1 refills | Status: DC | PRN

## 2019-01-11 MED ORDER — SEMAGLUTIDE(0.25 OR 0.5MG/DOS) 2 MG/1.5ML ~~LOC~~ SOPN: 0.2500 mg | PEN_INJECTOR | SUBCUTANEOUS | 0 refills | Status: DC

## 2019-01-11 NOTE — Progress Notes (Signed)
Subjective: CC: DM2 PCP: Casey Norlander, DO ZOX:[id number] Swallows is a 58 y.o. female presenting to clinic today for:  1. Type 2 Diabetes/ groin rash Patient reports: Fasting blood sugars typically greater than 250-367.  High at home: 367.  NO hypoglycemic episodes. Prescribed medication(s): Amaryl 4 mg, atorvastatin 80 mg and Lantus 42 units.  She also takes lisinopril 10 mg daily for blood pressure  Last eye exam: UTD Last foot exam: UTD Last A1c:  Lab Results  Component Value Date   HGBA1C >14.0 (H) 12/14/2018   Nephropathy screen indicated?: on ACE-I Last flu, zoster and/or pneumovax:  Immunization History  Administered Date(s) Administered  . Influenza Whole 10/20/2006  . Influenza,inj,Quad PF,6+ Mos 08/22/2013, 09/07/2014, 07/15/2016, 08/20/2017  . Pneumococcal Conjugate-13 09/07/2014  . Pneumococcal Polysaccharide-23 07/15/2016  . Td 10/20/2002    ROS: Denies dizziness, LOC, unintended weight loss/gain.  She reports chronic numbness or tingling in extremities.  She continues to have polyuria, polydipsia and had an episode of incontinence in the night recently. She reports an external itchy raw area in her groin. No therapies tried yet.  No CP/ shortness of breath.  2. Left elbow pain Patient reports ongoing left sided elbow pain.  She reports pain with certain movements like extension of elbow and pronation.  No palpable masses.  Hx of rotator cuff repair and bone spur removal on left.  No sensation changes.  She does report sensation of weakness in the left arm. Pain refractory to conservative care at home.  ROS: Per HPI  Allergies  Allergen Reactions  . Codeine Hives, Nausea And Vomiting and Other (See Comments)  . Jardiance [Empagliflozin]     Caused blisters  . Metformin And Related     nausea  . Niaspan [Niacin Er] Other (See Comments)    Increase blood glucose   Past Medical History:  Diagnosis Date  . Allergic rhinitis   . Anxiety and depression   .  Asthma   . Benign essential HTN 01/04/2011  . Chest pain    a. Myoview 9/05: no ischemia, no scar, EF 70%  . Chest pain, unspecified 02/12/2011  . Cystitis 02/08/2015  . Diverticulosis   . DM2 (diabetes mellitus, type 2) ([place])   . Extremity pain 01/04/2013  . Fibromyalgia   . Frequent UTI   . GERD (gastroesophageal reflux disease)    Hiatal Hernia  . HLD (hyperlipidemia)   . HTN (hypertension)   . Left rotator cuff tear   . Low back pain 01/04/2013  . Lumbar disc disease   . Nephrolithiasis   . Obesity   . Palpitations    monitor 9/05: NSR, sinus tachy, PVCs  . Pre-operative cardiovascular examination 02/12/2011  . Rectal bleeding 08/22/2013  . Sepsis secondary to UTI ([place]) 08/09/2017    Current Outpatient Medications:  .  albuterol (VENTOLIN HFA) 108 (90 Base) MCG/ACT inhaler, Inhale 1 puff into the lungs every 6 (six) hours as needed for wheezing or shortness of breath., Disp: 1 each, Rfl: 1 .  amitriptyline (ELAVIL) 25 MG tablet, Take one or two tablets at bedtime. (Patient taking differently: Take 25-50 mg by mouth at bedtime. ), Disp: 60 tablet, Rfl: 0 .  aspirin 81 MG EC tablet, Take 81 mg by mouth daily.  , Disp: , Rfl:  .  atorvastatin (LIPITOR) 80 MG tablet, TAKE 1 TABLET BY MOUTH ONCE DAILY AT  6  PM, Disp: 90 tablet, Rfl: 3 .  clonazePAM (KLONOPIN) 2 MG tablet, Take 2 mg by mouth  2 (two) times daily., Disp: , Rfl:  .  cyclobenzaprine (FLEXERIL) 10 MG tablet, Take 10 mg by mouth 3 (three) times daily as needed for muscle spasms. , Disp: , Rfl: 1 .  diclofenac sodium (VOLTAREN) 1 % GEL, Apply 2 g topically 4 (four) times daily., Disp: 100 g, Rfl: 1 .  Dulaglutide (TRULICITY) 1.5 KV/4.2VZ SOPN, INJECT 1 DOSE SUBQ ONCE WEEKLY (Patient not taking: Reported on 12/14/2018), Disp: 12 pen, Rfl: 1 .  FINGERSTIX LANCETS MISC, Use to check BG up to bid.  Dx:  250.00, Disp: 100 each, Rfl: 1 .  fish oil-omega-3 fatty acids 1000 MG capsule, Take 2 g by mouth daily.  , Disp: , Rfl:  .   gabapentin (NEURONTIN) 400 MG capsule, Take 1 capsule (400 mg total) by mouth 4 (four) times daily., Disp: 360 capsule, Rfl: 1 .  glimepiride (AMARYL) 4 MG tablet, TAKE 1 TABLET BY MOUTH TWICE DAILY MUST  BE  SEEN  FOR  REFILLS, Disp: 60 tablet, Rfl: 1 .  glucose blood (TRUETRACK TEST) test strip, Use to check BG up to bid.  Dx:  250.00, Disp: 100 each, Rfl: 1 .  Insulin Glargine (LANTUS SOLOSTAR) 100 UNIT/ML Solostar Pen, Inject 30 Units into the skin daily at 10 pm., Disp: 5 pen, Rfl: PRN .  levothyroxine (SYNTHROID, LEVOTHROID) 50 MCG tablet, Take 1 tablet (50 mcg total) by mouth daily., Disp: 30 tablet, Rfl: 1 .  lidocaine (LIDODERM) 5 %, Place 3 patches onto the skin daily. Apply up to 3 patches to area of pain for 12 hour, then Remove & Discard patch.  May reapply patch(es) after being patch free for 12 hours., Disp: 270 patch, Rfl: 0 .  lisinopril (PRINIVIL,ZESTRIL) 10 MG tablet, Take 1 tablet (10 mg total) by mouth daily., Disp: 90 tablet, Rfl: 3 .  sertraline (ZOLOFT) 100 MG tablet, Take 100 mg by mouth 2 (two) times daily. , Disp: , Rfl:  .  traMADol (ULTRAM) 50 MG tablet, Take 50 mg by mouth every 6 (six) hours as needed. , Disp: , Rfl: 1 .  Vitamin D, Ergocalciferol, (DRISDOL) 50000 units CAPS capsule, Take 1 capsule (50,000 Units total) by mouth every 7 (seven) days., Disp: 8 capsule, Rfl: 0 Social History   Socioeconomic History  . Marital status: Married    Spouse name: Not on file  . Number of children: 2  . Years of education: Not on file  . Highest education level: Not on file  Occupational History  . Occupation: disabled  Social Needs  . Financial resource strain: Not on file  . Food insecurity:    Worry: Not on file    Inability: Not on file  . Transportation needs:    Medical: Not on file    Non-medical: Not on file  Tobacco Use  . Smoking status: Former Research scientist (life sciences)  . Smokeless tobacco: Never Used  Substance and Sexual Activity  . Alcohol use: No  . Drug use: No  .  Sexual activity: Not on file  Lifestyle  . Physical activity:    Days per week: Not on file    Minutes per session: Not on file  . Stress: Not on file  Relationships  . Social connections:    Talks on phone: Not on file    Gets together: Not on file    Attends religious service: Not on file    Active member of club or organization: Not on file    Attends meetings of clubs or organizations: Not  on file    Relationship status: Not on file  . Intimate partner violence:    Fear of current or ex partner: Not on file    Emotionally abused: Not on file    Physically abused: Not on file    Forced sexual activity: Not on file  Other Topics Concern  . Not on file  Social History Narrative  . Not on file   Family History  Problem Relation Age of Onset  . Coronary artery disease Mother   . Coronary artery disease Father     Objective: Office vital signs reviewed. BP 116/67   Pulse 75   Temp (!) 97.5 F (36.4 C) (Oral)   Ht _0  (1.549 m)   Wt 215 lb (97.5 kg)   BMI 40.62 kg/m   Physical Examination:  General: Awake, alert, No acute distress HEENT: Normal, sclera white.  MMM Cardio: regular rate and rhythm, S1S2 heard, no murmurs appreciated Pulm: clear to auscultation bilaterally, no wheezes, rhonchi or rales; normal work of breathing on room air Extremities: warm, well perfused, No edema, cyanosis or clubbing; +2 pulses bilaterally MSK: antalgic gait and station; uses walker for ambulation; left elbow without palpable bony deformity.  She has no joint effusion or enlargement of bursa.   Assessment/ Plan: 58 y.o. female   1. Type 2 diabetes mellitus with other neurologic complication, without long-term current use of insulin (HCC) BGs continue to be elevated.  Trulicity was no longer covered on her insurance.  I have given her sample of Ozempic to start at 0.25 mg injected subcutaneously weekly.  We will titrate up to 0.5 mg in 4 weeks.  The prescription for 0.5 mg has been  sent to her pharmacy.  She will contact me if this is not affordable and we will have to focus on again just titrating the Lantus up.  I did discuss that use of this in conjunction with Lantus may precipitate hypoglycemic episodes so she is to monitor her blood sugars very closely and we will reduce the Lantus if needed. - CMP14+EGFR  2. Hypothyroidism, unspecified type Check thyroid panel with thyroid antibodies to evaluate for Hashimoto - Thyroid Panel With TSH - Thyroid Peroxidase Antibody - Thyroglobulin antibody  3. Elevated liver function tests Uncertain etiology.  Mild elevation noted.  Possibly related to elevated blood sugars. - CMP14+EGFR  4. Left elbow pain Likely soft tissue in nature.  Patient wishes to see Dr. Alma Friendly for her reevaluation because they perform surgery on the left shoulder.  I have placed this referral.  She is to continue icing, rest and massage.  5. Uncomplicated asthma, unspecified asthma severity, unspecified whether persistent Refill albuterol - albuterol (VENTOLIN HFA) 108 (90 Base) MCG/ACT inhaler; Inhale 1-2 puffs into the lungs every 6 (six) hours as needed for wheezing or shortness of breath.  Dispense: 1 each; Refill: 1  6. Tinea Likely tinea.  Recommended allowing groin to air out.  Reduce moisture as able.  Apply topical clotrimazole BID for 2-4 weeks.  Improvement in blood sugars will be essential. - clotrimazole (LOTRIMIN) 1 % cream; Apply 1 application topically 2 (two) times daily. (to external groin) x2-4 weeks  Dispense: 30 g; Refill: 1    Orders Placed This Encounter  Procedures  . Thyroid Panel With TSH  . Thyroid Peroxidase Antibody  . Thyroglobulin antibody  . CMP14+EGFR  . Ambulatory referral to Orthopedic Surgery    Referral Priority:   Routine    Referral Type:   Surgical  Referral Reason:   Specialty Services Required    Requested Specialty:   Orthopedic Surgery    Number of Visits Requested:   1   Meds ordered this  encounter  Medications  . Semaglutide,0.25 or 0.5MG/DOS, (OZEMPIC, 0.25 OR 0.5 MG/DOSE,) 2 MG/1.5ML SOPN    Sig: Inject 0.25 mg into the skin once a week for 28 days.    Dispense:  1 pen    Refill:  0  . Semaglutide,0.25 or 0.5MG/DOS, (OZEMPIC, 0.25 OR 0.5 MG/DOSE,) 2 MG/1.5ML SOPN    Sig: Inject 0.5 mg into the skin once a week.    Dispense:  1 pen    Refill:  3  . albuterol (VENTOLIN HFA) 108 (90 Base) MCG/ACT inhaler    Sig: Inhale 1-2 puffs into the lungs every 6 (six) hours as needed for wheezing or shortness of breath.    Dispense:  1 each    Refill:  1  . clotrimazole (LOTRIMIN) 1 % cream    Sig: Apply 1 application topically 2 (two) times daily. (to external groin) x2-4 weeks    Dispense:  30 g    Refill:  [place], DO [place] [phone]

## 2019-01-11 NOTE — Patient Instructions (Signed)
You had labs performed today.  You will be contacted with the results of the labs once they are available, usually in the next 3 business days for routine lab work.  If you had a pap smear or biopsy performed, expect to be contacted in about 7-10 days.  I have given you a sample of Ozempic today.  Inject 0.25mg  ONE time per week for 4 weeks.  I sent in the prescription to the pharmacy.  In 4 weeks, you will transition to 0.5mg  ONE time per week.  We will set up a phone call for blood sugar recheck in 2 weeks.

## 2019-01-12 ENCOUNTER — Other Ambulatory Visit: Payer: Self-pay | Admitting: Family Medicine

## 2019-01-12 LAB — CMP14+EGFR
ALT: 36 IU/L — ABNORMAL HIGH (ref 0–32)
AST: 33 IU/L (ref 0–40)
Albumin/Globulin Ratio: 2 (ref 1.2–2.2)
Albumin: 4.6 g/dL (ref 3.8–4.9)
Alkaline Phosphatase: 74 IU/L (ref 39–117)
BUN/Creatinine Ratio: 20 (ref 9–23)
BUN: 15 mg/dL (ref 6–24)
Bilirubin Total: 0.4 mg/dL (ref 0.0–1.2)
CO2: 25 mmol/L (ref 20–29)
Calcium: 9.4 mg/dL (ref 8.7–10.2)
Chloride: 97 mmol/L (ref 96–106)
Creatinine, Ser: 0.75 mg/dL (ref 0.57–1.00)
GFR calc Af Amer: 101 mL/min/{1.73_m2} (ref >59)
GFR calc non Af Amer: 88 mL/min/{1.73_m2} (ref >59)
Globulin, Total: 2.3 g/dL (ref 1.5–4.5)
Glucose: 376 mg/dL — ABNORMAL HIGH (ref 65–99)
POTASSIUM: 4.5 mmol/L (ref 3.5–5.2)
Sodium: 139 mmol/L (ref 134–144)
Total Protein: 6.9 g/dL (ref 6.0–8.5)

## 2019-01-12 LAB — THYROID PANEL WITH TSH
Free Thyroxine Index: 1.6 (ref 1.2–4.9)
T3 Uptake Ratio: 21 % — ABNORMAL LOW (ref 24–39)
T4, Total: 7.4 ug/dL (ref 4.5–12.0)
TSH: 4.56 u[IU]/mL — ABNORMAL HIGH (ref 0.450–4.500)

## 2019-01-12 LAB — THYROGLOBULIN ANTIBODY: Thyroglobulin Antibody: <1 IU/mL (ref 0.0–0.9)

## 2019-01-12 LAB — THYROID PEROXIDASE ANTIBODY: THYROID PEROXIDASE ANTIBODY: 158 [IU]/mL — AB (ref 0–34)

## 2019-01-12 MED ORDER — LEVOTHYROXINE SODIUM 75 MCG PO TABS: 75.0000 ug | ORAL_TABLET | Freq: Every day | ORAL | 0 refills | Status: DC

## 2019-01-13 ENCOUNTER — Telehealth: Payer: Self-pay

## 2019-01-13 NOTE — Telephone Encounter (Signed)
Medicaid non-preferred Ozempic.  Preferred are Bydureon and Victoza.

## 2019-01-14 ENCOUNTER — Other Ambulatory Visit: Payer: Self-pay | Admitting: Family Medicine

## 2019-01-14 DIAGNOSIS — E1149 Type 2 diabetes mellitus with other diabetic neurological complication: Secondary | ICD-10-CM

## 2019-01-14 MED ORDER — INSULIN PEN NEEDLE 32G X 6 MM MISC: 1 refills | Status: DC

## 2019-01-14 MED ORDER — LIRAGLUTIDE 18 MG/3ML ~~LOC~~ SOPN: PEN_INJECTOR | SUBCUTANEOUS | 0 refills | Status: DC

## 2019-01-14 NOTE — Telephone Encounter (Signed)
PATIENT AWARE AND VERBALIZED UNDERSTANDING.

## 2019-01-14 NOTE — Telephone Encounter (Signed)
Please inform patient that Ozempic is non preferred. Ok to complete the sample I provided her.  I would then like her to transition to [place] daily.  She will start this ONE AFTER her last dose of Ozempic.  I will send this to the pharmacy.  Please make sure that she understands the instructions.

## 2019-01-18 ENCOUNTER — Other Ambulatory Visit: Payer: Self-pay | Admitting: Family Medicine

## 2019-01-18 NOTE — Telephone Encounter (Signed)
Called pt and reviewed office notes and meds and labs (med changes)   Aware of all

## 2019-02-02 ENCOUNTER — Other Ambulatory Visit: Payer: Self-pay | Admitting: Pediatrics

## 2019-02-02 DIAGNOSIS — M797 Fibromyalgia: Secondary | ICD-10-CM

## 2019-02-07 ENCOUNTER — Telehealth: Payer: Self-pay

## 2019-02-07 NOTE — Telephone Encounter (Signed)
[place] services;  patient reports that she already has a therapist.

## 2019-02-09 ENCOUNTER — Ambulatory Visit (INDEPENDENT_AMBULATORY_CARE_PROVIDER_SITE_OTHER): Payer: Medicaid Other | Admitting: Family Medicine

## 2019-02-09 ENCOUNTER — Other Ambulatory Visit: Payer: Self-pay

## 2019-02-09 ENCOUNTER — Telehealth: Payer: Self-pay | Admitting: Family Medicine

## 2019-02-09 ENCOUNTER — Other Ambulatory Visit: Payer: Self-pay | Admitting: Pediatrics

## 2019-02-09 DIAGNOSIS — W19XXXA Unspecified fall, initial encounter: Secondary | ICD-10-CM

## 2019-02-09 DIAGNOSIS — E1149 Type 2 diabetes mellitus with other diabetic neurological complication: Secondary | ICD-10-CM

## 2019-02-09 DIAGNOSIS — I10 Essential (primary) hypertension: Secondary | ICD-10-CM

## 2019-02-09 MED ORDER — INSULIN PEN NEEDLE 32G X 6 MM MISC: 1 refills | Status: DC

## 2019-02-09 MED ORDER — INSULIN GLARGINE 100 UNIT/ML SOLOSTAR PEN: 30.0000 [IU] | PEN_INJECTOR | Freq: Every day | SUBCUTANEOUS | 99 refills | Status: DC

## 2019-02-09 MED ORDER — INSULIN GLARGINE 100 UNIT/ML SOLOSTAR PEN: 48.0000 [IU] | PEN_INJECTOR | Freq: Every day | SUBCUTANEOUS | 99 refills | Status: DC

## 2019-02-09 NOTE — Telephone Encounter (Signed)
Left patient a voicemail asking her to call the office to schedule an appointment for telephone follow up with Dr. Lajuana Ripple per her office note on 01/11/2019.  1 month of lisinopril sent in.

## 2019-02-09 NOTE — Progress Notes (Signed)
Telephone visit  Subjective: CC: DM2 PCP: [name], DO NGE:[id number] Dunkleberger is a 58 y.o. female calls for telephone consult today. Patient provides verbal consent for consult held via phone.  Location of patient: home Location of provider: [facility] Others present for call: none  1. Type 2 Diabetes:  Patient reports: High at home: 280s; Low at home: 230s, Taking medication(s): Ozempic 0.25mg , Lantus 48u.  She has been avoiding sweets.  She is snacking on veggies at home.  She has not started the Victoza yet.  She is finishing up the Ozempic.  She denies any nausea, vomiting or abdominal pain with use of Ozempic. Last A1c:  Lab Results  Component Value Date   HGBA1C >14.0 (H) 12/14/2018   Nephropathy screen indicated?: ACE-I Last flu, zoster and/or pneumovax:  Immunization History  Administered Date(s) Administered  . Influenza Whole 10/20/2006  . Influenza,inj,Quad PF,6+ Mos 08/22/2013, 09/07/2014, 07/15/2016, 08/20/2017  . Pneumococcal Conjugate-13 09/07/2014  . Pneumococcal Polysaccharide-23 07/15/2016  . Td 10/20/2002    ROS: Denies CP, SOB, Edema.  She reports a fall 2 weeks ago and has subsequent bruising along her lower extremity/ thigh.  She reports that the bruise wraps around the back of her thigh and was black but it is looking purple now.  She is ambulatory and is walking with her walker.  She did not hit her head.  She does report some pain and swelling of that region.  She has not tried taking any medications or performing any therapies in efforts to relieve it.  Sitting around seems to make symptoms worse but walking around relieves it.  No skin breakdown.   Allergies  Allergen Reactions  . Codeine Hives, Nausea And Vomiting and Other (See Comments)  . Jardiance [Empagliflozin]     Caused blisters  . Metformin And Related     nausea  . Niaspan [Niacin Er] Other (See Comments)    Increase blood glucose   Past Medical History:  Diagnosis Date  . Allergic  rhinitis   . Anxiety and depression   . Asthma   . Benign essential HTN 01/04/2011  . Chest pain    a. Myoview 9/05: no ischemia, no scar, EF 70%  . Chest pain, unspecified 02/12/2011  . Cystitis 02/08/2015  . Diverticulosis   . DM2 (diabetes mellitus, type 2) ([place])   . Extremity pain 01/04/2013  . Fibromyalgia   . Frequent UTI   . GERD (gastroesophageal reflux disease)    Hiatal Hernia  . HLD (hyperlipidemia)   . HTN (hypertension)   . Left rotator cuff tear   . Low back pain 01/04/2013  . Lumbar disc disease   . Nephrolithiasis   . Obesity   . Palpitations    monitor 9/05: NSR, sinus tachy, PVCs  . Pre-operative cardiovascular examination 02/12/2011  . Rectal bleeding 08/22/2013  . Sepsis secondary to UTI ([place]) 08/09/2017    Current Outpatient Medications:  .  albuterol (VENTOLIN HFA) 108 (90 Base) MCG/ACT inhaler, Inhale 1-2 puffs into the lungs every 6 (six) hours as needed for wheezing or shortness of breath., Disp: 1 each, Rfl: 1 .  amitriptyline (ELAVIL) 25 MG tablet, Take one or two tablets at bedtime. (Patient taking differently: Take 25-50 mg by mouth at bedtime. ), Disp: 60 tablet, Rfl: 0 .  aspirin 81 MG EC tablet, Take 81 mg by mouth daily.  , Disp: , Rfl:  .  atorvastatin (LIPITOR) 80 MG tablet, TAKE 1 TABLET BY MOUTH ONCE DAILY AT  6  PM, Disp: 90 tablet, Rfl: 3 .  clonazePAM (KLONOPIN) 2 MG tablet, Take 2 mg by mouth 2 (two) times daily., Disp: , Rfl:  .  clotrimazole (LOTRIMIN) 1 % cream, Apply 1 application topically 2 (two) times daily. (to external groin) x2-4 weeks, Disp: 30 g, Rfl: 1 .  cyclobenzaprine (FLEXERIL) 10 MG tablet, Take 10 mg by mouth 3 (three) times daily as needed for muscle spasms. , Disp: , Rfl: 1 .  diclofenac sodium (VOLTAREN) 1 % GEL, Apply 2 g topically 4 (four) times daily., Disp: 100 g, Rfl: 1 .  FINGERSTIX LANCETS MISC, Use to check BG up to bid.  Dx:  250.00, Disp: 100 each, Rfl: 1 .  fish oil-omega-3 fatty acids 1000 MG capsule, Take 2 g  by mouth daily.  , Disp: , Rfl:  .  gabapentin (NEURONTIN) 400 MG capsule, Take 1 capsule by mouth 4 times daily, Disp: 120 capsule, Rfl: 0 .  glucose blood (TRUETRACK TEST) test strip, Use to check BG up to bid.  Dx:  250.00, Disp: 100 each, Rfl: 1 .  Insulin Glargine (LANTUS SOLOSTAR) 100 UNIT/ML Solostar Pen, Inject 48 Units into the skin daily at 10 pm., Disp: 5 pen, Rfl: PRN .  Insulin Pen Needle (NOVOFINE) 32G X 6 MM MISC, UAD to inject Victoza and Lantus daily., Disp: 200 each, Rfl: 1 .  levothyroxine (SYNTHROID, LEVOTHROID) 75 MCG tablet, Take 1 tablet (75 mcg total) by mouth daily before breakfast., Disp: 90 tablet, Rfl: 0 .  lidocaine (LIDODERM) 5 %, Place 3 patches onto the skin daily. Apply up to 3 patches to area of pain for 12 hour, then Remove & Discard patch.  May reapply patch(es) after being patch free for 12 hours., Disp: 270 patch, Rfl: 0 .  liraglutide (VICTOZA) 18 MG/3ML SOPN, Inject 0.1 mLs (0.6 mg total) into the skin daily for 14 days, THEN 0.2 mLs (1.2 mg total) daily for 28 days., Disp: 7 mL, Rfl: 0 .  lisinopril (ZESTRIL) 10 MG tablet, Take 1 tablet by mouth once daily, Disp: 30 tablet, Rfl: 0 .  sertraline (ZOLOFT) 100 MG tablet, Take 100 mg by mouth 2 (two) times daily. , Disp: , Rfl:  .  traMADol (ULTRAM) 50 MG tablet, Take 50 mg by mouth every 6 (six) hours as needed. , Disp: , Rfl: 1 .  Vitamin D, Ergocalciferol, (DRISDOL) 50000 units CAPS capsule, Take 1 capsule (50,000 Units total) by mouth every 7 (seven) days., Disp: 8 capsule, Rfl: 0  Assessment/ Plan: 58 y.o. female   1. Type 2 diabetes mellitus with other neurologic complication, without long-term current use of insulin (HCC) It sounds like she is getting better control of her sugar.  She has had no blood sugars in the 300s which is reassuring.  I have renewed her Lantus reflecting her new dose of insulin.  She understands that she will transition from the Ozempic to the [place], as Ozempic was not covered by  her insurance.  We discussed how to use the Victoza.  We will plan to see each other face-to-face in about 3 months for repeat A1c and other lab work.  I congratulated her on her dietary changes and encouraged her to continue to be physically active as able. - Insulin Glargine (LANTUS SOLOSTAR) 100 UNIT/ML Solostar Pen; Inject 48 Units into the skin daily at 10 pm.  Dispense: 5 pen; Refill: PRN - Insulin Pen Needle (NOVOFINE) 32G X 6 MM MISC; UAD to inject Victoza and Lantus daily.  Dispense: 200 each; Refill: 1  2. Fall, initial encounter Mechanical.  What she describes sounds like a hematoma.  We discussed there is a possibility of fracture in that area and that if she feels that symptoms are worsening or not improving appropriately that she should have a low threshold to come in for x-rays and further evaluation.  I have advised her to apply ice, use Tylenol and use topical analgesic of choice.  She voiced good understanding and will follow-up PRN   Start time: 10:40am End time: 11:00am  Total time spent on patient care (including telephone call/ virtual visit): 25 minutes  [place], [place] [phone]

## 2019-02-27 ENCOUNTER — Other Ambulatory Visit: Payer: Self-pay | Admitting: Family Medicine

## 2019-02-27 DIAGNOSIS — M797 Fibromyalgia: Secondary | ICD-10-CM

## 2019-03-17 ENCOUNTER — Other Ambulatory Visit: Payer: Self-pay | Admitting: Family Medicine

## 2019-03-17 DIAGNOSIS — I10 Essential (primary) hypertension: Secondary | ICD-10-CM

## 2019-04-12 ENCOUNTER — Telehealth: Payer: Self-pay | Admitting: Family Medicine

## 2019-04-12 ENCOUNTER — Other Ambulatory Visit: Payer: Self-pay

## 2019-04-12 NOTE — Telephone Encounter (Signed)
Pt changed her appt 

## 2019-04-13 ENCOUNTER — Ambulatory Visit: Payer: Self-pay | Admitting: Family Medicine

## 2019-05-03 ENCOUNTER — Ambulatory Visit: Payer: Self-pay | Admitting: Family Medicine

## 2019-05-27 ENCOUNTER — Other Ambulatory Visit: Payer: Self-pay | Admitting: *Deleted

## 2019-05-27 DIAGNOSIS — E785 Hyperlipidemia, unspecified: Secondary | ICD-10-CM

## 2019-05-27 DIAGNOSIS — E1169 Type 2 diabetes mellitus with other specified complication: Secondary | ICD-10-CM

## 2019-05-27 MED ORDER — ATORVASTATIN CALCIUM 80 MG PO TABS: ORAL_TABLET | ORAL | 0 refills | Status: DC

## 2019-05-30 ENCOUNTER — Other Ambulatory Visit: Payer: Self-pay

## 2019-05-31 ENCOUNTER — Ambulatory Visit: Payer: Self-pay | Admitting: Family Medicine

## 2019-06-17 ENCOUNTER — Other Ambulatory Visit: Payer: Self-pay | Admitting: Family Medicine

## 2019-06-17 DIAGNOSIS — I10 Essential (primary) hypertension: Secondary | ICD-10-CM

## 2019-07-04 ENCOUNTER — Other Ambulatory Visit: Payer: Self-pay

## 2019-07-05 ENCOUNTER — Encounter: Payer: Self-pay | Admitting: Family Medicine

## 2019-07-05 ENCOUNTER — Ambulatory Visit: Payer: Medicaid Other | Admitting: Family Medicine

## 2019-07-05 VITALS — BP 121/76 | HR 101 | Temp 96.0°F | Resp 20 | Ht 61.0 in | Wt 211.6 lb

## 2019-07-05 DIAGNOSIS — E1169 Type 2 diabetes mellitus with other specified complication: Secondary | ICD-10-CM | POA: Diagnosis not present

## 2019-07-05 DIAGNOSIS — E1165 Type 2 diabetes mellitus with hyperglycemia: Secondary | ICD-10-CM | POA: Diagnosis not present

## 2019-07-05 DIAGNOSIS — E1159 Type 2 diabetes mellitus with other circulatory complications: Secondary | ICD-10-CM | POA: Diagnosis not present

## 2019-07-05 DIAGNOSIS — I10 Essential (primary) hypertension: Secondary | ICD-10-CM

## 2019-07-05 DIAGNOSIS — I152 Hypertension secondary to endocrine disorders: Secondary | ICD-10-CM

## 2019-07-05 DIAGNOSIS — E1149 Type 2 diabetes mellitus with other diabetic neurological complication: Secondary | ICD-10-CM | POA: Diagnosis not present

## 2019-07-05 DIAGNOSIS — E039 Hypothyroidism, unspecified: Secondary | ICD-10-CM

## 2019-07-05 DIAGNOSIS — Z23 Encounter for immunization: Secondary | ICD-10-CM

## 2019-07-05 DIAGNOSIS — E559 Vitamin D deficiency, unspecified: Secondary | ICD-10-CM | POA: Diagnosis not present

## 2019-07-05 DIAGNOSIS — E785 Hyperlipidemia, unspecified: Secondary | ICD-10-CM

## 2019-07-05 LAB — BAYER DCA HB A1C WAIVED: HB A1C (BAYER DCA - WAIVED): >14 % — ABNORMAL HIGH (ref <7.0)

## 2019-07-05 NOTE — Patient Instructions (Signed)
Increase your Lantus to 60 units at bedtime.    You will continue to increase lantus by 1 unit every 2 days IF your fasting morning blood sugar is above 150.  Monitor your blood sugar and we will review log in 1 month.  I am also referring you to endocrinology for assistance.

## 2019-07-05 NOTE — Progress Notes (Signed)
Subjective: CC: DM2, HLD PCP: [name], DO OEU:MPNTIRW Casey Sanders is a 58 y.o. female presenting to clinic today for:  1. Type 2 Diabetes w/ HLD Patient reports: Fasting blood sugars typically greater than 200  High at home: 500s.  Prescribed medication(s): Victoza, Atorvastatin 80 mg and Lantus 50 units qhs.  She also takes lisinopril 10 mg daily.  Last eye exam: UTD Last foot exam: needs Last A1c:  Lab Results  Component Value Date   HGBA1C >14.0 (H) 12/14/2018   Nephropathy screen indicated?: on ACE-I Last flu, zoster and/or pneumovax:  Immunization History  Administered Date(s) Administered  . Influenza Whole 10/20/2006  . Influenza,inj,Quad PF,6+ Mos 08/22/2013, 09/07/2014, 07/15/2016, 08/20/2017  . Pneumococcal Conjugate-13 09/07/2014  . Pneumococcal Polysaccharide-23 07/15/2016  . Td 10/20/2002    ROS: Reports some low energy.  Denies any worsening sensation changes, chest pain, shortness of breath or falls.  She has difficulty with vision, particularly in the right side where she has worsening macular degeneration.  She is due for follow-up with her eye doctor  2. Hypothyroidism Patient noted to have elevated TSH 4.560 in March.  Her thyroid dose was increased to 75 mcg daily.  She notes that she is compliant with the Synthroid.  She denies any change in bowel habits, heart palpitations or swelling  ROS: Per HPI  Allergies  Allergen Reactions  . Codeine Hives, Nausea And Vomiting and Other (See Comments)  . Jardiance [Empagliflozin]     Caused blisters  . Metformin And Related     nausea  . Niaspan [Niacin Er] Other (See Comments)    Increase blood glucose   Past Medical History:  Diagnosis Date  . Allergic rhinitis   . Anxiety and depression   . Asthma   . Benign essential HTN 01/04/2011  . Chest pain    a. Myoview 9/05: no ischemia, no scar, EF 70%  . Chest pain, unspecified 02/12/2011  . Cystitis 02/08/2015  . Diverticulosis   . DM2 (diabetes  mellitus, type 2) ([place])   . Extremity pain 01/04/2013  . Fibromyalgia   . Frequent UTI   . GERD (gastroesophageal reflux disease)    Hiatal Hernia  . HLD (hyperlipidemia)   . HTN (hypertension)   . Left rotator cuff tear   . Low back pain 01/04/2013  . Lumbar disc disease   . Nephrolithiasis   . Obesity   . Palpitations    monitor 9/05: NSR, sinus tachy, PVCs  . Pre-operative cardiovascular examination 02/12/2011  . Rectal bleeding 08/22/2013  . Sepsis secondary to UTI ([place]) 08/09/2017    Current Outpatient Medications:  .  albuterol (VENTOLIN HFA) 108 (90 Base) MCG/ACT inhaler, Inhale 1-2 puffs into the lungs every 6 (six) hours as needed for wheezing or shortness of breath., Disp: 1 each, Rfl: 1 .  amitriptyline (ELAVIL) 25 MG tablet, Take one or two tablets at bedtime. (Patient taking differently: Take 25-50 mg by mouth at bedtime. ), Disp: 60 tablet, Rfl: 0 .  aspirin 81 MG EC tablet, Take 81 mg by mouth daily.  , Disp: , Rfl:  .  atorvastatin (LIPITOR) 80 MG tablet, TAKE 1 TABLET BY MOUTH ONCE DAILY AT  6  PM, Disp: 90 tablet, Rfl: 0 .  clonazePAM (KLONOPIN) 2 MG tablet, Take 2 mg by mouth 2 (two) times daily., Disp: , Rfl:  .  clotrimazole (LOTRIMIN) 1 % cream, Apply 1 application topically 2 (two) times daily. (to external groin) x2-4 weeks, Disp: 30 g, Rfl: 1 .  cyclobenzaprine (FLEXERIL) 10 MG tablet, Take 10 mg by mouth 3 (three) times daily as needed for muscle spasms. , Disp: , Rfl: 1 .  diclofenac sodium (VOLTAREN) 1 % GEL, Apply 2 g topically 4 (four) times daily., Disp: 100 g, Rfl: 1 .  FINGERSTIX LANCETS MISC, Use to check BG up to bid.  Dx:  250.00, Disp: 100 each, Rfl: 1 .  fish oil-omega-3 fatty acids 1000 MG capsule, Take 2 g by mouth daily.  , Disp: , Rfl:  .  gabapentin (NEURONTIN) 400 MG capsule, Take 1 capsule by mouth 4 times daily, Disp: 360 capsule, Rfl: 0 .  glucose blood (TRUETRACK TEST) test strip, Use to check BG up to bid.  Dx:  250.00, Disp: 100 each, Rfl:  1 .  Insulin Glargine (LANTUS SOLOSTAR) 100 UNIT/ML Solostar Pen, Inject 48 Units into the skin daily at 10 pm., Disp: 5 pen, Rfl: PRN .  Insulin Pen Needle (NOVOFINE) 32G X 6 MM MISC, UAD to inject Victoza and Lantus daily., Disp: 200 each, Rfl: 1 .  levothyroxine (SYNTHROID, LEVOTHROID) 75 MCG tablet, Take 1 tablet (75 mcg total) by mouth daily before breakfast., Disp: 90 tablet, Rfl: 0 .  lidocaine (LIDODERM) 5 %, Place 3 patches onto the skin daily. Apply up to 3 patches to area of pain for 12 hour, then Remove & Discard patch.  May reapply patch(es) after being patch free for 12 hours., Disp: 270 patch, Rfl: 0 .  liraglutide (VICTOZA) 18 MG/3ML SOPN, Inject 0.1 mLs (0.6 mg total) into the skin daily for 14 days, THEN 0.2 mLs (1.2 mg total) daily for 28 days., Disp: 7 mL, Rfl: 0 .  lisinopril (ZESTRIL) 10 MG tablet, Take 1 tablet by mouth once daily, Disp: 90 tablet, Rfl: 0 .  sertraline (ZOLOFT) 100 MG tablet, Take 100 mg by mouth 2 (two) times daily. , Disp: , Rfl:  .  traMADol (ULTRAM) 50 MG tablet, Take 50 mg by mouth every 6 (six) hours as needed. , Disp: , Rfl: 1 .  Vitamin D, Ergocalciferol, (DRISDOL) 50000 units CAPS capsule, Take 1 capsule (50,000 Units total) by mouth every 7 (seven) days., Disp: 8 capsule, Rfl: 0 Social History   Socioeconomic History  . Marital status: Married    Spouse name: Not on file  . Number of children: 2  . Years of education: Not on file  . Highest education level: Not on file  Occupational History  . Occupation: disabled  Social Needs  . Financial resource strain: Not on file  . Food insecurity    Worry: Not on file    Inability: Not on file  . Transportation needs    Medical: Not on file    Non-medical: Not on file  Tobacco Use  . Smoking status: Former Research scientist (life sciences)  . Smokeless tobacco: Never Used  Substance and Sexual Activity  . Alcohol use: No  . Drug use: No  . Sexual activity: Not on file  Lifestyle  . Physical activity    Days per  week: Not on file    Minutes per session: Not on file  . Stress: Not on file  Relationships  . Social Herbalist on phone: Not on file    Gets together: Not on file    Attends religious service: Not on file    Active member of club or organization: Not on file    Attends meetings of clubs or organizations: Not on file    Relationship status: Not  on file  . Intimate partner violence    Fear of current or ex partner: Not on file    Emotionally abused: Not on file    Physically abused: Not on file    Forced sexual activity: Not on file  Other Topics Concern  . Not on file  Social History Narrative  . Not on file   Family History  Problem Relation Age of Onset  . Coronary artery disease Mother   . Coronary artery disease Father     Objective: Office vital signs reviewed. BP 121/76   Pulse (!) 101   Temp (!) 96 F (35.6 C) (Temporal)   Resp 20   Ht '5\' 1"'  (1.549 m)   Wt 211 lb 9.6 oz (96 kg)   SpO2 96%   BMI 39.98 kg/m   Physical Examination:  General: Awake, alert, No acute distress HEENT: Normal, sclera white.  MMM Cardio: regular rate and rhythm, S1S2 heard, no murmurs appreciated Pulm: clear to auscultation bilaterally, no wheezes, rhonchi or rales; normal work of breathing on room air Extremities: warm, well perfused, No edema, cyanosis or clubbing; +2 pulses bilaterally MSK: antalgic gait and station; uses walker for ambulation  Neuro: see DM foot. Mentation delayed but appropriate. Diabetic Foot Exam - Simple   Simple Foot Form Diabetic Foot exam was performed with the following findings: Yes 07/05/2019 12:53 PM  Visual Inspection See comments: Yes Sensation Testing See comments: Yes Pulse Check See comments: Yes Comments Absent vibratory sensation in great toes bilaterally.  These are present in the ankles bilaterally.  She has +1 pedal pulses bilaterally.  Her monofilament testing is greatly decreased, particularly on the plantar aspect of the  foot.  She has onychomycotic changes to the toenails and skin.    Assessment/ Plan: 58 y.o. female   1. Uncontrolled type 2 diabetes mellitus with hyperglycemia (HCC) A1c remains above 14.  At this point, I have discussed with her that we should probably have an endocrinologist involved as we do not seem to be making much progress with adjustment of Lantus and addition of Victoza.  I have increased her Lantus further to 60 units nightly and advised her to increase by 1 unit every 2 days for fasting blood sugars greater than 150.  She will follow-up with me in 4 weeks to review her blood sugar log and titration of the insulin further.  I have also asked her to check postprandial blood sugars and record, may need mealtime dosing - Bayer DCA Hb A1c Waived - Ambulatory referral to Endocrinology  2. Hyperlipidemia associated with type 2 diabetes mellitus ([place]) - CMP14+EGFR  3. Hypertension associated with diabetes ([place]) - CMP14+EGFR  4. Hypothyroidism, unspecified type Asymptomatic - Thyroid Panel With TSH  5. Vitamin D deficiency - VITAMIN D 25 Hydroxy (Vit-D Deficiency, Fractures)    Orders Placed This Encounter  Procedures  . VITAMIN D 25 Hydroxy (Vit-D Deficiency, Fractures)  . Bayer DCA Hb A1c Waived  . CMP14+EGFR  . Thyroid Panel With TSH   No orders of the defined types were placed in this encounter.    [name], DO [place] [phone]

## 2019-07-06 ENCOUNTER — Other Ambulatory Visit: Payer: Self-pay | Admitting: Family Medicine

## 2019-07-06 LAB — CMP14+EGFR
ALT: 29 IU/L (ref 0–32)
AST: 27 IU/L (ref 0–40)
Albumin/Globulin Ratio: 1.8 (ref 1.2–2.2)
Albumin: 4.4 g/dL (ref 3.8–4.9)
Alkaline Phosphatase: 59 IU/L (ref 39–117)
BUN/Creatinine Ratio: 21 (ref 12–28)
BUN: 17 mg/dL (ref 8–27)
Bilirubin Total: 0.6 mg/dL (ref 0.0–1.2)
CO2: 25 mmol/L (ref 20–29)
Calcium: 9.5 mg/dL (ref 8.7–10.3)
Chloride: 96 mmol/L (ref 96–106)
Creatinine, Ser: 0.8 mg/dL (ref 0.57–1.00)
GFR calc Af Amer: 93 mL/min/{1.73_m2} (ref >59)
GFR calc non Af Amer: 80 mL/min/{1.73_m2} (ref >59)
Globulin, Total: 2.4 g/dL (ref 1.5–4.5)
Glucose: 352 mg/dL — ABNORMAL HIGH (ref 65–99)
Potassium: 4.5 mmol/L (ref 3.5–5.2)
Sodium: 135 mmol/L (ref 134–144)
Total Protein: 6.8 g/dL (ref 6.0–8.5)

## 2019-07-06 LAB — THYROID PANEL WITH TSH
Free Thyroxine Index: 1.7 (ref 1.2–4.9)
T3 Uptake Ratio: 25 % (ref 24–39)
T4, Total: 6.7 ug/dL (ref 4.5–12.0)
TSH: 4.15 u[IU]/mL (ref 0.450–4.500)

## 2019-07-06 LAB — VITAMIN D 25 HYDROXY (VIT D DEFICIENCY, FRACTURES): Vit D, 25-Hydroxy: 61.6 ng/mL (ref 30.0–100.0)

## 2019-07-06 MED ORDER — LEVOTHYROXINE SODIUM 75 MCG PO TABS: 75.0000 ug | ORAL_TABLET | Freq: Every day | ORAL | 0 refills | Status: DC

## 2019-07-14 ENCOUNTER — Other Ambulatory Visit: Payer: Self-pay | Admitting: Family Medicine

## 2019-07-14 DIAGNOSIS — M797 Fibromyalgia: Secondary | ICD-10-CM

## 2019-08-30 ENCOUNTER — Other Ambulatory Visit: Payer: Self-pay | Admitting: Family Medicine

## 2019-08-30 DIAGNOSIS — E1169 Type 2 diabetes mellitus with other specified complication: Secondary | ICD-10-CM

## 2019-08-30 DIAGNOSIS — E785 Hyperlipidemia, unspecified: Secondary | ICD-10-CM

## 2019-09-12 ENCOUNTER — Other Ambulatory Visit: Payer: Self-pay | Admitting: Family Medicine

## 2019-09-12 DIAGNOSIS — I10 Essential (primary) hypertension: Secondary | ICD-10-CM

## 2019-09-13 ENCOUNTER — Other Ambulatory Visit: Payer: Self-pay | Admitting: Family Medicine

## 2019-09-13 DIAGNOSIS — I10 Essential (primary) hypertension: Secondary | ICD-10-CM

## 2019-10-04 ENCOUNTER — Other Ambulatory Visit: Payer: Self-pay

## 2019-10-05 ENCOUNTER — Ambulatory Visit: Payer: Medicaid Other | Admitting: Family Medicine

## 2019-10-26 LAB — HM DIABETES EYE EXAM

## 2019-11-16 ENCOUNTER — Other Ambulatory Visit: Payer: Self-pay | Admitting: Family Medicine

## 2019-11-16 DIAGNOSIS — M797 Fibromyalgia: Secondary | ICD-10-CM

## 2019-12-13 ENCOUNTER — Other Ambulatory Visit: Payer: Self-pay | Admitting: Family Medicine

## 2019-12-13 DIAGNOSIS — I10 Essential (primary) hypertension: Secondary | ICD-10-CM

## 2019-12-16 ENCOUNTER — Ambulatory Visit (INDEPENDENT_AMBULATORY_CARE_PROVIDER_SITE_OTHER): Payer: Medicaid Other

## 2019-12-16 ENCOUNTER — Other Ambulatory Visit: Payer: Self-pay

## 2019-12-16 ENCOUNTER — Ambulatory Visit: Payer: Medicaid Other | Admitting: Family Medicine

## 2019-12-16 ENCOUNTER — Encounter: Payer: Self-pay | Admitting: Family Medicine

## 2019-12-16 VITALS — BP 112/67 | HR 101 | Temp 98.0°F | Resp 20 | Ht 61.0 in | Wt 198.0 lb

## 2019-12-16 DIAGNOSIS — W19XXXA Unspecified fall, initial encounter: Secondary | ICD-10-CM

## 2019-12-16 DIAGNOSIS — M25562 Pain in left knee: Secondary | ICD-10-CM

## 2019-12-16 DIAGNOSIS — M25462 Effusion, left knee: Secondary | ICD-10-CM | POA: Diagnosis not present

## 2019-12-16 IMAGING — DX DG KNEE 1-2V*L*
2 series · 2 of 2 positions shown · non-contrast
Comparison: [DATE]

CLINICAL DATA: Fell, left knee pain

EXAM:
LEFT KNEE - 1-2 VIEW

[knee ap]
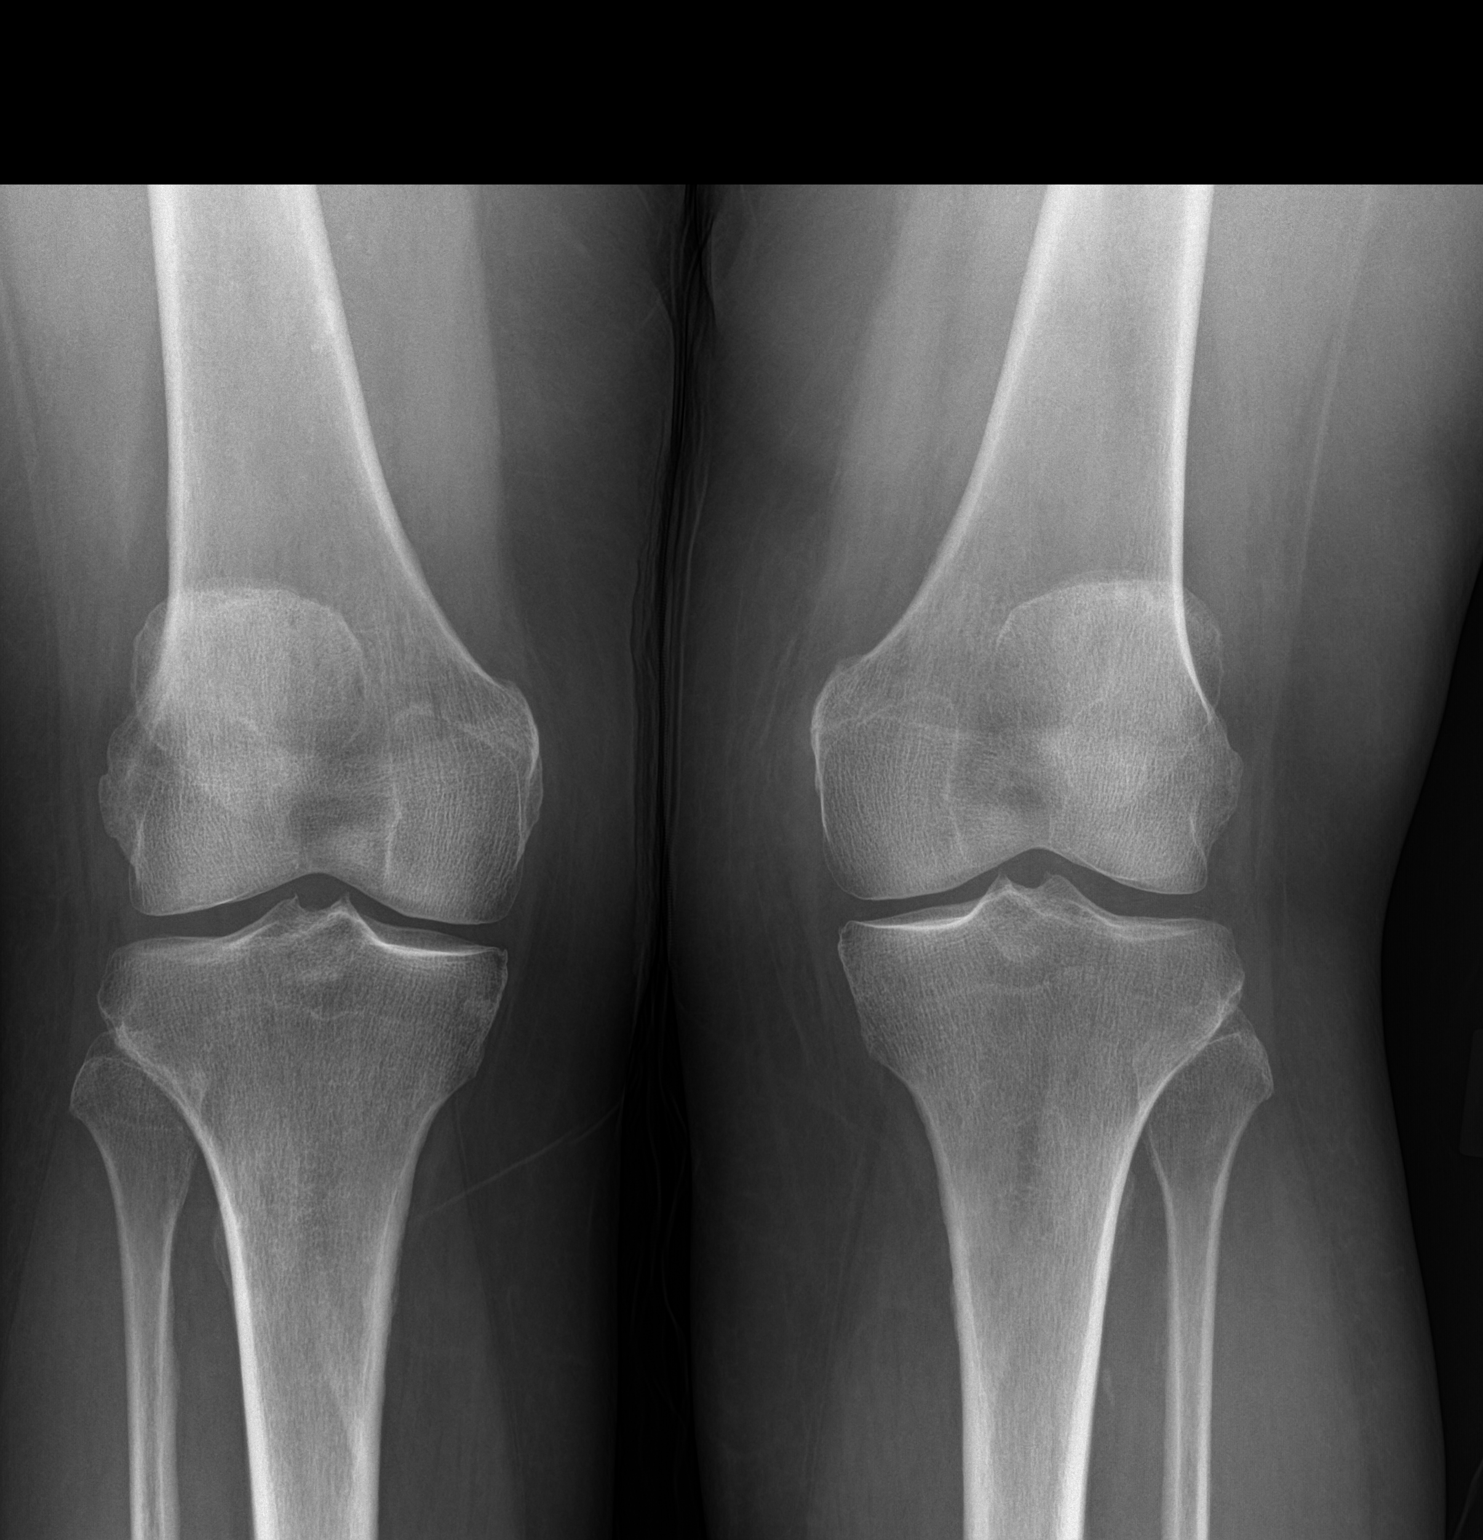

[knee lat]
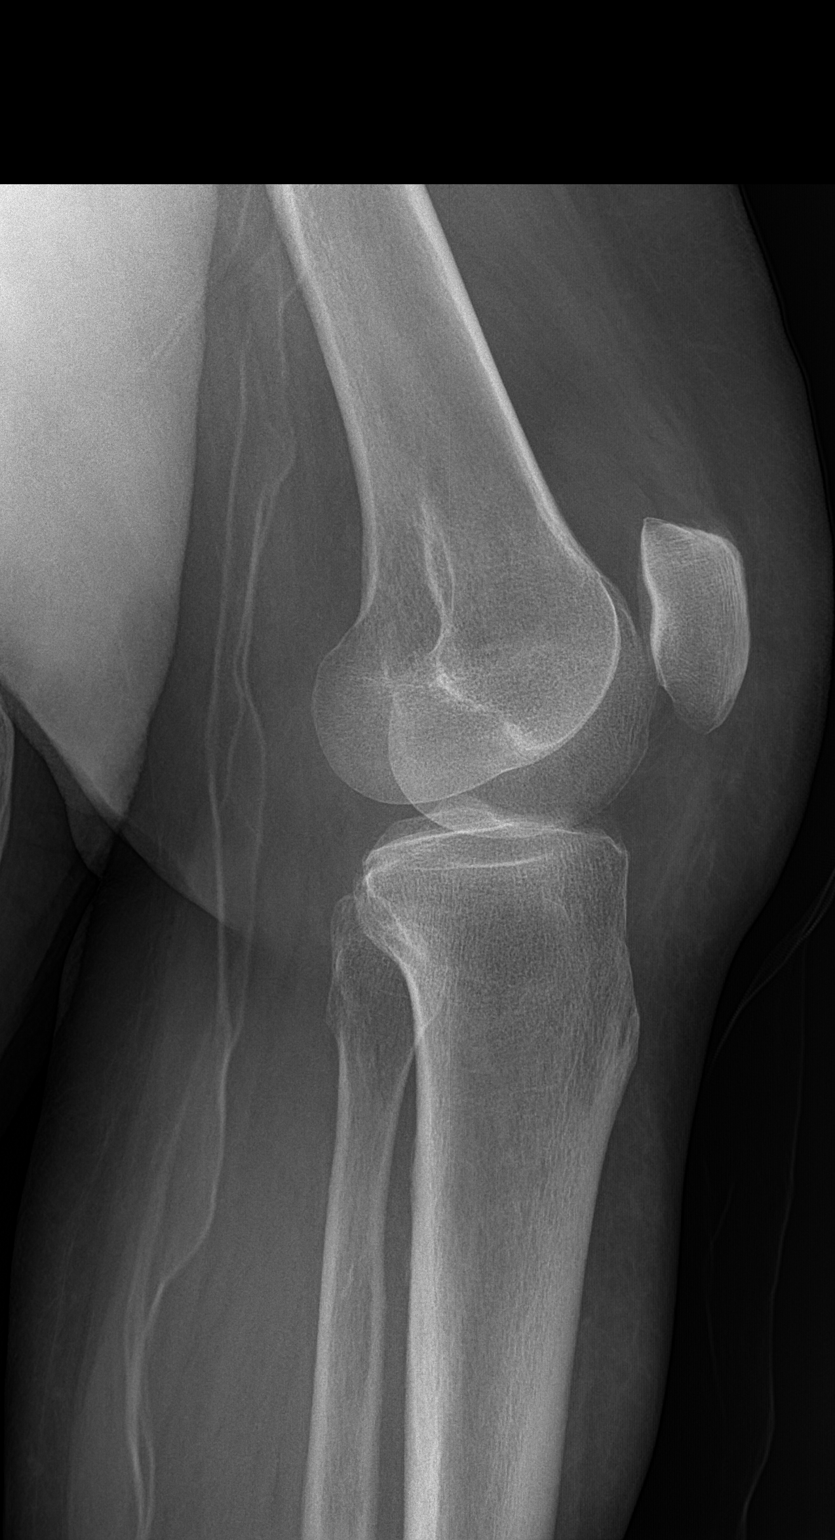

[2 of 2 positions shown; findings below may reference images not displayed]

FINDINGS: Frontal views of both knees as well as a lateral view of the left
knee are obtained. Limited imaging the right knee demonstrates no
gross abnormalities.

On the left joint spaces are well preserved. There is a moderate
left knee effusion. Moderate prepatellar soft tissue edema. No
fracture, subluxation, or dislocation.
IMPRESSION: 1. Moderate left knee effusion and prepatellar soft tissue edema.
2. No acute bony abnormalities.

## 2019-12-16 MED ORDER — PREDNISONE 10 MG (21) PO TBPK: ORAL_TABLET | ORAL | 0 refills | Status: DC

## 2019-12-16 NOTE — Progress Notes (Signed)
Name: Casey Sanders   MRN: [id number]    DOB: 07-26-1961   Date:12/16/2019       Progress Note  Subjective  Chief Complaint  Chief Complaint  Patient presents with  . Fall  . left knee pain    HPI Patient presents today for left knee pain and after falling on Wednesday. She uses a walker to assist with ambulation. She reports that she thinks her walker wheels got caught up while she was walking to her bathroom causing her to fall against the wall. She denies hitting her knee but felt like she twisted it as she fell. She reports left knee pain since the fall with swelling. She has been using voltaren gel with some relief.   Patient Active Problem List   Diagnosis Date Noted  . Hypertensive disorder 12/14/2018  . Asthma 03/09/2017  . Vitamin D deficiency 03/09/2017  . Generalized anxiety disorder 05/23/2016  . Gastroesophageal reflux disease 05/23/2016  . Depressive disorder 05/23/2016  . Fibromyalgia 05/23/2016  . Family history of cardiac disorder 05/23/2016  . Diabetic neuropathy ([place]) 12/19/2013  . Neuropathy due to type 2 diabetes mellitus ([place]) 03/17/2013  . Degeneration of lumbar intervertebral disc 01/04/2013  . Chronic pain syndrome 01/04/2013  . 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 Syncope 02/12/2011  . Diabetes mellitus ([place]) 01/04/2011  . Chronic pain 01/04/2011  . 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 Hyperlipidemia associated with type 2 diabetes mellitus ([place]) 01/04/2011  . Restless legs 01/04/2011  . 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[place]) 01/04/2011  . Palpitations 01/04/2011    Social History   Tobacco Use  . Smoking status: Former Research scientist (life sciences)  . Smokeless tobacco: Never Used  Substance Use Topics  . Alcohol use: No     Current Outpatient Medications:  .  albuterol (VENTOLIN HFA) 108 (90 Base) MCG/ACT inhaler, Inhale 1-2 puffs into the lungs every 6 (six) hours as needed for wheezing or  shortness of breath., Disp: 1 each, Rfl: 1 .  amitriptyline (ELAVIL) 25 MG tablet, Take one or two tablets at bedtime. (Patient taking differently: Take 25-50 mg by mouth at bedtime. ), Disp: 60 tablet, Rfl: 0 .  aspirin 81 MG EC tablet, Take 81 mg by mouth daily.  , Disp: , Rfl:  .  atorvastatin (LIPITOR) 80 MG tablet, TAKE 1 TABLET BY MOUTH ONCE DAILY AT 6 PM, Disp: 90 tablet, Rfl: 1 .  clonazePAM (KLONOPIN) 2 MG tablet, Take 2 mg by mouth 2 (two) times daily., Disp: , Rfl:  .  clotrimazole (LOTRIMIN) 1 % cream, Apply 1 application topically 2 (two) times daily. (to external groin) x2-4 weeks, Disp: 30 g, Rfl: 1 .  cyclobenzaprine (FLEXERIL) 10 MG tablet, Take 10 mg by mouth 3 (three) times daily as needed for muscle spasms. , Disp: , Rfl: 1 .  diclofenac sodium (VOLTAREN) 1 % GEL, Apply 2 g topically 4 (four) times daily., Disp: 100 g, Rfl: 1 .  FINGERSTIX LANCETS MISC, Use to check BG up to bid.  Dx:  250.00, Disp: 100 each, Rfl: 1 .  fish oil-omega-3 fatty acids 1000 MG capsule, Take 2 g by mouth daily.  , Disp: , Rfl:  .  gabapentin (NEURONTIN) 400 MG capsule, Take 1 capsule (400 mg total) by mouth 4 (four) times daily. (Needs to be seen before next refill), Disp: 120 capsule, Rfl: 0 .  glucose blood (TRUETRACK TEST) test strip, Use to check BG up to bid.  Dx:  250.00, Disp: 100 each, Rfl: 1 .  Insulin Glargine (LANTUS SOLOSTAR) 100 UNIT/ML Solostar Pen, Inject 48 Units into the skin daily at 10 pm., Disp: 5 pen, Rfl: PRN .  Insulin Pen Needle (NOVOFINE) 32G X 6 MM MISC, UAD to inject Victoza and Lantus daily., Disp: 200 each, Rfl: 1 .  liraglutide (VICTOZA) 18 MG/3ML SOPN, Inject 0.1 mLs (0.6 mg total) into the skin daily for 14 days, THEN 0.2 mLs (1.2 mg total) daily for 28 days., Disp: 7 mL, Rfl: 0 .  lisinopril (ZESTRIL) 10 MG tablet, Take 1 tablet by mouth once daily, Disp: 90 tablet, Rfl: 0 .  sertraline (ZOLOFT) 100 MG tablet, Take 100 mg by mouth 2 (two) times daily. , Disp: , Rfl:  .   traMADol (ULTRAM) 50 MG tablet, Take 50 mg by mouth every 6 (six) hours as needed. , Disp: , Rfl: 1 .  Vitamin D, Ergocalciferol, (DRISDOL) 50000 units CAPS capsule, Take 1 capsule (50,000 Units total) by mouth every 7 (seven) days., Disp: 8 capsule, Rfl: 0  Allergies  Allergen Reactions  . Codeine Hives, Nausea And Vomiting and Other (See Comments)  . Jardiance [Empagliflozin]     Caused blisters  . Metformin And Related     nausea  . Niaspan [Niacin Er] Other (See Comments)    Increase blood glucose    I personally reviewed active problem list, medication list, allergies, family history, social history, health maintenance, notes from last encounter, lab results, imaging with the patient/caregiver today.  Review of Systems  Constitutional: Negative.  Negative for chills, diaphoresis, fever, malaise/fatigue and weight loss.  HENT: Negative.   Eyes: Negative.   Respiratory: Negative.  Negative for shortness of breath.   Cardiovascular: Negative.  Negative for chest pain and palpitations.  Gastrointestinal: Negative.   Genitourinary: Negative.   Musculoskeletal: Positive for falls and joint pain. Negative for back pain, myalgias and neck pain.  Skin: Negative.   Neurological: Negative.  Negative for dizziness, focal weakness, loss of consciousness and weakness.  Endo/Heme/Allergies: Negative.   Psychiatric/Behavioral: Negative.   All other systems reviewed and are negative.   Objective  Vitals:   12/16/19 1500  BP: 112/67  Pulse: (!) 101  Resp: 20  Temp: 98 F (36.7 C)  SpO2: 99%  Weight: 198 lb (89.8 kg)  Height: 5\' 1"  (1.549 m)   Body mass index is 37.41 kg/m.  Nursing Note and Vital Signs reviewed.  Physical Exam Vitals and nursing note reviewed.  Constitutional:      Appearance: Normal appearance. She is obese.  HENT:     Head: Normocephalic and atraumatic.     Nose: Nose normal.     Mouth/Throat:     Mouth: Mucous membranes are moist.  Eyes:     Pupils:  Pupils are equal, round, and reactive to light.  Cardiovascular:     Rate and Rhythm: Normal rate and regular rhythm.     Pulses: Normal pulses.     Heart sounds: Normal heart sounds.  Pulmonary:     Effort: Pulmonary effort is normal.     Breath sounds: Normal breath sounds.  Abdominal:     General: Abdomen is flat.     Palpations: Abdomen is soft.  Musculoskeletal:        General: Swelling, tenderness and signs of injury present.     Cervical back: Normal range of motion and neck supple.     Left hip: Normal.     Left knee: Swelling present.  No deformity, effusion, erythema, ecchymosis, lacerations, bony tenderness or crepitus. Decreased range of motion. Tenderness present over the lateral joint line. No LCL laxity, MCL laxity, ACL laxity or PCL laxity.Normal alignment, normal meniscus and normal patellar mobility. Normal pulse.     Instability Tests: Negative medial McMurray test and negative lateral McMurray test.     Left lower leg: Swelling present. No edema.     Left ankle: Normal.     Comments: Edema to left knee extending down into anterior upper shin  Skin:    General: Skin is warm and dry.     Capillary Refill: Capillary refill takes less than 2 seconds.  Neurological:     General: No focal deficit present.     Mental Status: She is alert and oriented to person, place, and time. Mental status is at baseline.     Gait: Gait abnormal (antalgic, uses walker).  Psychiatric:        Mood and Affect: Mood normal.        Behavior: Behavior normal.        Thought Content: Thought content normal.        Judgment: Judgment normal.     X-Ray: left knee: No acute findings. Preliminary x-ray reading by Monia Pouch, FNP-C, [facility].  Assessment & Plan  1. Acute pain of left knee 2. Fall, initial encounter - DG Knee 1-2 Views Left; Future Knee brace applied in office and patient educated on proper use.  Discussed continuing use of voltaren gel, RICE to help with swelling and  pain.   -Red flags and when to present for emergency care or RTC including fever >101.23F, chest pain, shortness of breath, new/worsening/un-resolving symptoms, reviewed with patient at time of visit. Follow up and care instructions discussed and provided in AVS.   Return in about 6 weeks (around 01/27/2020), or if symptoms worsen or fail to improve, for knee pain.  The above assessment and management plan was discussed with the patient. The patient verbalized understanding of and has agreed to the management plan. Patient is aware to call the clinic if they develop any new symptoms or if symptoms fail to improve or worsen. Patient is aware when to return to the clinic for a follow-up visit. Patient educated on when it is appropriate to go to the emergency department.    Scherrie Gerlach, BSN, RN, AGNP-Student   I personally was present during the history, physical exam, and medical decision-making activities of this service and have verified that the service and findings are accurately documented in the nurse practitioner student's note.  Monia Pouch, FNP-C [facility] [address] [place], [place] [address] [phone]

## 2019-12-16 NOTE — Patient Instructions (Signed)

## 2020-01-17 ENCOUNTER — Other Ambulatory Visit: Payer: Self-pay | Admitting: Family Medicine

## 2020-01-17 DIAGNOSIS — M797 Fibromyalgia: Secondary | ICD-10-CM

## 2020-01-17 NOTE — Telephone Encounter (Signed)
[place]ott[place]. NTBS 30 days given 11/16/19

## 2020-01-18 ENCOUNTER — Other Ambulatory Visit: Payer: Self-pay | Admitting: Family Medicine

## 2020-01-18 DIAGNOSIS — M797 Fibromyalgia: Secondary | ICD-10-CM

## 2020-01-18 NOTE — Telephone Encounter (Signed)
[place]ott[place]. NTBS 30 days given 11/16/19

## 2020-01-18 NOTE — Telephone Encounter (Signed)
Left message - call office to set up and appointment for medication refill.

## 2020-01-27 ENCOUNTER — Encounter: Payer: Self-pay | Admitting: Pharmacist

## 2020-01-27 ENCOUNTER — Other Ambulatory Visit: Payer: Self-pay

## 2020-01-27 ENCOUNTER — Encounter: Payer: Self-pay | Admitting: Family Medicine

## 2020-01-27 ENCOUNTER — Ambulatory Visit (INDEPENDENT_AMBULATORY_CARE_PROVIDER_SITE_OTHER): Payer: Medicaid Other | Admitting: Family Medicine

## 2020-01-27 ENCOUNTER — Telehealth: Payer: Self-pay | Admitting: Family Medicine

## 2020-01-27 VITALS — BP 103/71 | HR 103 | Temp 97.3°F | Ht 61.0 in | Wt 192.8 lb

## 2020-01-27 DIAGNOSIS — E039 Hypothyroidism, unspecified: Secondary | ICD-10-CM | POA: Diagnosis not present

## 2020-01-27 DIAGNOSIS — I10 Essential (primary) hypertension: Secondary | ICD-10-CM

## 2020-01-27 DIAGNOSIS — E1159 Type 2 diabetes mellitus with other circulatory complications: Secondary | ICD-10-CM

## 2020-01-27 DIAGNOSIS — E1165 Type 2 diabetes mellitus with hyperglycemia: Secondary | ICD-10-CM | POA: Diagnosis not present

## 2020-01-27 DIAGNOSIS — M797 Fibromyalgia: Secondary | ICD-10-CM

## 2020-01-27 LAB — BAYER DCA HB A1C WAIVED: HB A1C (BAYER DCA - WAIVED): >14 % — ABNORMAL HIGH (ref <7.0)

## 2020-01-27 MED ORDER — TRULICITY 1.5 MG/0.5ML ~~LOC~~ SOAJ: 1.5000 mg | SUBCUTANEOUS | 2 refills | Status: DC

## 2020-01-27 MED ORDER — GABAPENTIN 400 MG PO CAPS: 400.0000 mg | ORAL_CAPSULE | Freq: Four times a day (QID) | ORAL | 2 refills | Status: DC

## 2020-01-27 NOTE — Progress Notes (Signed)
BP 103/71   Pulse (!) 103   Temp (!) 97.3 F (36.3 C) (Temporal)   Ht '5\' 1"'  (1.549 m)   Wt 87.5 kg   BMI 36.43 kg/m    Subjective:   Patient ID: Casey Sanders, female    DOB: 03/09/1961, 59 y.o.   MRN: [id number]  HPI: Casey Sanders is a 59 y.o. female presenting on 01/27/2020 for Follow-up (6 month) 1. Type 2 DM with HLD Patient reports: Fasting blood sugars high at home due to recent stress of her husband passing away last summer and a recent dose of Prednisone for knee pain after a recent fall. Prescribed meds: Lantus Solostar 50 units every night; Victoza injection QD; Atorvastatin 80 mg QD; Lisinopril 10 mg QD. Sometimes she will inject extra during the day time.  This depends if her sugar is high.  Sometimes she will have a low (lowest 43).  Denies having had change in diet, or that this occurs on a day she injects the extra insulin. Last eye exam: UTD (within the past year) Last foot exam: UTD (performed 07/05/2019) Last A1C: >14.0 (on 01/27/2020)  ROS:  HEENT: + double vision in right eye (due to worsening macular degeneration). Neuro: + occasional tingling in legs and feet at night, + decreased sensation on the tops of both feet, + decreased balance. GU: + frequent UTIs.  2. Hypothyroidism: Asymptomatic. Will check Thyroid Function and TSH today.  3. Hypertension associated with DM: Patient reports that her BP of 103/71 today in clinic is representative of her typical BP measurements at home. She is compliant with checking her BP at home. Currently taking Lisinopril 10 mg QD for HTN. She reports some dizziness.  ROS: Per HPI  Relevant past medical, surgical, family and social history reviewed and updated as indicated. Interim medical history since our last visit reviewed. Allergies and medications reviewed and updated.  Review of Systems  Per HPI unless specifically indicated above   Allergies as of 01/27/2020      Reactions   Codeine Hives, Nausea And Vomiting,  Other (See Comments)   Jardiance [empagliflozin]    Caused blisters   Metformin And Related    nausea   Niaspan [niacin Er] Other (See Comments)   Increase blood glucose      Medication List       Accurate as of January 27, 2020 11:50 AM. If you have any questions, ask your nurse or doctor.        STOP taking these medications   liraglutide 18 MG/3ML Sopn Commonly known as: Victoza Stopped by: Ronnie Doss, DO     TAKE these medications   albuterol 108 (90 Base) MCG/ACT inhaler Commonly known as: Ventolin HFA Inhale 1-2 puffs into the lungs every 6 (six) hours as needed for wheezing or shortness of breath.   amitriptyline 25 MG tablet Commonly known as: ELAVIL Take one or two tablets at bedtime. What changed:   how much to take  how to take this  when to take this  additional instructions   aspirin 81 MG EC tablet Take 81 mg by mouth daily.   atorvastatin 80 MG tablet Commonly known as: LIPITOR TAKE 1 TABLET BY MOUTH ONCE DAILY AT 6 PM   clonazePAM 2 MG tablet Commonly known as: KLONOPIN Take 2 mg by mouth 2 (two) times daily.   clotrimazole 1 % cream Commonly known as: LOTRIMIN Apply 1 application topically 2 (two) times daily. (to external groin) x2-4 weeks   cyclobenzaprine 10  MG tablet Commonly known as: FLEXERIL Take 10 mg by mouth 3 (three) times daily as needed for muscle spasms.   diclofenac sodium 1 % Gel Commonly known as: VOLTAREN Apply 2 g topically 4 (four) times daily.   Fingerstix Lancets Misc Use to check BG up to bid.  Dx:  250.00   fish oil-omega-3 fatty acids 1000 MG capsule Take 2 g by mouth daily.   gabapentin 400 MG capsule Commonly known as: NEURONTIN Take 1 capsule (400 mg total) by mouth 4 (four) times daily. (Needs to be seen before next refill)   glucose blood test strip Commonly known as: TrueTrack Test Use to check BG up to bid.  Dx:  250.00   insulin glargine 100 UNIT/ML Solostar Pen Commonly known as: Lantus  SoloStar Inject 48 Units into the skin daily at 10 pm.   Insulin Pen Needle 32G X 6 MM Misc Commonly known as: NovoFine UAD to inject Victoza and Lantus daily.   lisinopril 10 MG tablet Commonly known as: ZESTRIL Take 1 tablet by mouth once daily   predniSONE 10 MG (21) Tbpk tablet Commonly known as: STERAPRED UNI-PAK 21 TAB As directed x 6 days   sertraline 100 MG tablet Commonly known as: ZOLOFT Take 100 mg by mouth 2 (two) times daily.   traMADol 50 MG tablet Commonly known as: ULTRAM Take 50 mg by mouth every 6 (six) hours as needed.   Vitamin D (Ergocalciferol) 1.25 MG (50000 UNIT) Caps capsule Commonly known as: DRISDOL Take 1 capsule (50,000 Units total) by mouth every 7 (seven) days.        Objective:   BP 103/71   Pulse (!) 103   Temp (!) 97.3 F (36.3 C) (Temporal)   Ht '5\' 1"'  (1.549 m)   Wt 87.5 kg   BMI 36.43 kg/m   Wt Readings from Last 3 Encounters:  01/27/20 87.5 kg  12/16/19 89.8 kg  07/05/19 96 kg    Physical Exam   General: Patient appears WDWN, stated age, and in no acute distress. HEENT: PERRLA. TMs visible bilaterally. Cardio: RRR, S1S2 heard. Pulm: CTAB, normal work of breathing on room air. Extremities: +2 radial and dorsalis pedis pulses. MSK: Using walker for ambulation.  Results for orders placed or performed in visit on 01/27/20  Bayer DCA Hb A1c Waived  Result Value Ref Range   HB A1C (BAYER DCA - WAIVED) >14.0 (H) <7.0 %    Assessment & Plan:   Problem List Items Addressed This Visit    None    Visit Diagnoses    Uncontrolled type 2 diabetes mellitus with hyperglycemia ([place])    -  Primary   Relevant Orders   CBC with Differential/Platelet   CMP14+EGFR   Bayer DCA Hb A1c Waived (Completed)   Hypertension associated with diabetes ([place])       Relevant Orders   CBC with Differential/Platelet   CMP14+EGFR   Hypothyroidism, unspecified type       Relevant Orders   CBC with Differential/Platelet   CMP14+EGFR    Thyroid Panel With TSH      1. Uncontrolled type 2 diabetes mellitus with hyperglycemia ([place]): A1C is still above 14. Continue current medication management of T2DM for now. Will have patient meet with on-site pharmacist to discuss medication management and glucose control in detail.  2. Hypertension associated with diabetes: Well controlled. Continue current HTN med regimen. Will check CMP14 + EGFR today.  3. Hypothyroidism, unspecified type: Asymptomatic. Will check Thyroid Panel with TSH  today.   Follow up plan:  RTC in 3 month for a T2DM follow up appointment and HgA1C recheck. Follow up with on-site pharmacist PRN.  Orders Placed This Encounter  Procedures  . CBC with Differential/Platelet  . CMP14+EGFR  . Thyroid Panel With TSH  . Bayer [facility] Hb A1c Waived    Gaynelle Arabian, PA-S2 [facility] 01/27/2020, 11:50 AM

## 2020-01-27 NOTE — Telephone Encounter (Signed)
Appointment made patient aware

## 2020-01-28 LAB — CMP14+EGFR
ALT: 31 IU/L (ref 0–32)
AST: 30 IU/L (ref 0–40)
Albumin/Globulin Ratio: 1.8 (ref 1.2–2.2)
Albumin: 4.6 g/dL (ref 3.8–4.9)
Alkaline Phosphatase: 94 IU/L (ref 39–117)
BUN/Creatinine Ratio: 15 (ref 12–28)
BUN: 13 mg/dL (ref 8–27)
Bilirubin Total: 0.7 mg/dL (ref 0.0–1.2)
CO2: 24 mmol/L (ref 20–29)
Calcium: 9.8 mg/dL (ref 8.7–10.3)
Chloride: 93 mmol/L — ABNORMAL LOW (ref 96–106)
Creatinine, Ser: 0.85 mg/dL (ref 0.57–1.00)
GFR calc Af Amer: 86 mL/min/{1.73_m2} (ref >59)
GFR calc non Af Amer: 75 mL/min/{1.73_m2} (ref >59)
Globulin, Total: 2.5 g/dL (ref 1.5–4.5)
Glucose: 469 mg/dL (ref 65–99)
Potassium: 4.5 mmol/L (ref 3.5–5.2)
Sodium: 133 mmol/L — ABNORMAL LOW (ref 134–144)
Total Protein: 7.1 g/dL (ref 6.0–8.5)

## 2020-01-28 LAB — CBC WITH DIFFERENTIAL/PLATELET
Basophils Absolute: 0 10*3/uL (ref 0.0–0.2)
Basos: 1 %
EOS (ABSOLUTE): 0.1 10*3/uL (ref 0.0–0.4)
Eos: 1 %
Hematocrit: 44.6 % (ref 34.0–46.6)
Hemoglobin: 14.9 g/dL (ref 11.1–15.9)
Immature Grans (Abs): 0 10*3/uL (ref 0.0–0.1)
Immature Granulocytes: 0 %
Lymphocytes Absolute: 2 10*3/uL (ref 0.7–3.1)
Lymphs: 29 %
MCH: 28.1 pg (ref 26.6–33.0)
MCHC: 33.4 g/dL (ref 31.5–35.7)
MCV: 84 fL (ref 79–97)
Monocytes Absolute: 0.3 10*3/uL (ref 0.1–0.9)
Monocytes: 4 %
Neutrophils Absolute: 4.7 10*3/uL (ref 1.4–7.0)
Neutrophils: 65 %
Platelets: 226 10*3/uL (ref 150–450)
RBC: 5.31 x10E6/uL — ABNORMAL HIGH (ref 3.77–5.28)
RDW: 12.7 % (ref 11.7–15.4)
WBC: 7.1 10*3/uL (ref 3.4–10.8)

## 2020-01-28 LAB — THYROID PANEL WITH TSH
Free Thyroxine Index: 1.5 (ref 1.2–4.9)
T3 Uptake Ratio: 23 % — ABNORMAL LOW (ref 24–39)
T4, Total: 6.7 ug/dL (ref 4.5–12.0)
TSH: 5.76 u[IU]/mL — ABNORMAL HIGH (ref 0.450–4.500)

## 2020-01-31 ENCOUNTER — Other Ambulatory Visit: Payer: Self-pay | Admitting: Family Medicine

## 2020-01-31 DIAGNOSIS — E039 Hypothyroidism, unspecified: Secondary | ICD-10-CM

## 2020-01-31 NOTE — Progress Notes (Signed)
  Pharmacy Clinic Visit  01/30/2020 Name: Casey Sanders MRN: [id number] DOB: 02-19-1961  Referred by: [name], DO Reason for referral : Diabetes and Medication Management  27 yoF referred to pharmacy clinic for diabetes management . Diabetes: D4KA; complicated by chronic medical conditions including HLD, HTN most recent A1c >14% on 4.9.21 . Most recent eGFR: 75 . Current antihyperglycemic regimen: Lantus 48 units . Denies hypoglycemic symptoms; Reports hyperglycemic symptoms, including polyuria, polydipsia, nocturia . Current meal patterns: o Breakfast: toast, coffee, eggs o Lunch: sandwiches, bananas o Supper: usually meat, veggie, potato o Snacks: bananas, fruit, crackers o Drinks: encouraged patient to avoid sugary drinks, etc--increase water intake . Current exercise: n/a, uses walker, h/o falls . Current blood glucose readings: FBG 200-400 . Cardiovascular risk reduction: o Current hypertensive regimen: lisinopril '10mg'$   o Current hyperlipidemia regimen: atorvastatin '80mg'$ -LDL 67 on 12/14/18 (needs repeat at next OV) o Current antiplatelet regimen: ASA '81mg'$    Lab Results  Component Value Date   HGBA1C >14.0 (H) 01/27/2020   Lipid Panel     Component Value Date/Time   CHOL 143 12/14/2018 0915   TRIG 132 12/14/2018 0915   TRIG 121 11/07/2014 1130   HDL 50 12/14/2018 0915   HDL 40 11/07/2014 1130   CHOLHDL 2.9 12/14/2018 0915   CHOLHDL 3.2 07/08/2013 0435   VLDL 31 07/08/2013 0435   LDLCALC 67 12/14/2018 0915     Diabetes longstanding T2DM currently uncontrolled. Patient is able to verbalize appropriate hypoglycemia management plan. Patient is not adherent with medication. Control is suboptimal due to dietary/lifestlye/financial constraints.  -Continued basal insulin Lantus (insulin glargine) 50 units nightly (patient has not been taking this dose as prescribed)  -Added GLP-1 Trulicity 1.'5mg'$  (generic name: dulaglutide) to DM regimen.  Patient has been on this  medication in the past and stable.  She has not been on medication due to finances  Sample-trulicity 1.'5mg'$  weekly given [id number] EXP 2/22  -Extensively discussed pathophysiology of diabetes, recommended lifestyle interventions, dietary effects on blood sugar control  -recommended to half banana intake  -Counseled on s/sx of and management of hypoglycemia  -Next A1C anticipated 46-month.    Written patient instructions provided.  Total time in face to face counseling 20 minutes.   Follow up Pharmacist Clinic Visit in 1 MONTH.     JRegina Eck PharmD, BCPS Clinical Pharmacist, [place] Phone [phone]

## 2020-02-14 ENCOUNTER — Telehealth: Payer: Self-pay | Admitting: Family Medicine

## 2020-02-14 DIAGNOSIS — E1165 Type 2 diabetes mellitus with hyperglycemia: Secondary | ICD-10-CM

## 2020-02-15 ENCOUNTER — Telehealth: Payer: Self-pay | Admitting: Family Medicine

## 2020-02-15 MED ORDER — TRULICITY 1.5 MG/0.5ML ~~LOC~~ SOAJ: 1.5000 mg | SUBCUTANEOUS | 2 refills | Status: DC

## 2020-02-15 NOTE — Telephone Encounter (Signed)
   Patient's BGs have decreased from 300s to 150-170s over the course of 2 weeks after Trulicity was added to therapy  Patient is still taking Lantus 30 units qhs--recommended increase to 32 units qhs  Encouraged dietary changes-decrease banana intake  Patient is excited for progress  Trulicity RX called into [facility] --pending insurance amout, encouraged patient to call back if copay unreasonable.  Copay should be $3 with dual medicare/caid coverage. Will apply for PAP when she loses medicaid in June 2021  Regina Eck, PharmD, BCPS Clinical Pharmacist, [place]  [place] Phone [phone]

## 2020-02-17 ENCOUNTER — Telehealth: Payer: Self-pay | Admitting: Pharmacist

## 2020-02-17 NOTE — Telephone Encounter (Signed)
PA via [place] tracks/Medicaid Patient has tried/failed metformin Remains on insulin  BGs have decreased with addition of GLP1 therapy  Confirmation [id number] W Prior Approval [id number]  Status:APPROVED on 02/17/20  Regina Eck, PharmD, BCPS Clinical Pharmacist, [place]  II Phone [phone]

## 2020-02-17 NOTE — Telephone Encounter (Signed)
No sample given due to insurance approval Patient notified and instructed to go to [facility] in [place] to pick up

## 2020-02-17 NOTE — Telephone Encounter (Signed)
Patient states her medication will be [id number] (Trulicity-for 1 month) Sample #1 box 2 pens, 2 week supply [id number] EXP 2/22 Will explore insurance information, patient states she has Medicaid until 08/2020 (new card is not in chart)

## 2020-02-22 ENCOUNTER — Ambulatory Visit: Payer: Medicaid Other | Admitting: Pharmacist

## 2020-02-27 ENCOUNTER — Ambulatory Visit: Payer: Medicaid Other | Admitting: Pharmacist

## 2020-02-28 ENCOUNTER — Other Ambulatory Visit: Payer: Self-pay

## 2020-02-28 ENCOUNTER — Ambulatory Visit: Payer: Medicaid Other | Admitting: Pharmacist

## 2020-02-28 VITALS — BP 117/76 | HR 97

## 2020-02-28 DIAGNOSIS — E1165 Type 2 diabetes mellitus with hyperglycemia: Secondary | ICD-10-CM

## 2020-02-28 NOTE — Progress Notes (Signed)
   Pharmacy Clinic Visit  5/11//2021 Name: Casey Sanders MRN: [id number]       DOB: 1961-03-29  Referred by: [name], DO Reason for referral : Diabetes and Medication Management  50 yoF referred to pharmacy clinic for diabetes management  Diabetes: V4CY; complicated by chronic medical conditions including HLD, HTN most recent A1c >14% on 4.9.21  Most recent eGFR: 75  Current antihyperglycemic regimen: Lantus 30 units (has not increased dose since we last spoke)  Denies hypoglycemic symptoms; Reports hyperglycemic symptoms, including polyuria, polydipsia, nocturia  Current meal patterns: ? Breakfast: toast, coffee, eggs ? Lunch: sandwiches, bananas ? Supper: usually meat, veggie, potato ? Snacks: bananas, fruit, crackers ? Drinks: encouraged patient to avoid sugary drinks, etc--increase water intake  Current exercise: n/a, uses walker, h/o falls  Current blood glucose readings: FBG 200-400  Cardiovascular risk reduction: ? Current hypertensive regimen: lisinopril '10mg'$   ? Current hyperlipidemia regimen: atorvastatin '80mg'$ -LDL 67 on 12/14/18 (needs repeat at next OV ? Current antiplatelet regimen: ASA '81mg'$    Recent Labs       Lab Results  Component Value Date   HGBA1C >14.0 (H) 01/27/2020     Lipid Panel  Labs (Brief)          Component Value Date/Time   CHOL 143 12/14/2018 0915   TRIG 132 12/14/2018 0915   TRIG 121 11/07/2014 1130   HDL 50 12/14/2018 0915   HDL 40 11/07/2014 1130   CHOLHDL 2.9 12/14/2018 0915   CHOLHDL 3.2 07/08/2013 0435   VLDL 31 07/08/2013 0435   LDLCALC 67 12/14/2018 0915       FBG 246 this AM   lantus 30 units qHS  Denies BG<70 (103 feels very low to her)  FBG range per patient --> 200-250   Diabetes longstanding T2DM currently uncontrolled. Patient is able to verbalize appropriate hypoglycemia management plan. Patient is not adherent with medication. Control is suboptimal due to dietary/lifestlye/financial  constraints.  -Increased basal insulin Lantus (insulin glargine) 35 units nightly (patient has not been taking this dose as prescribed)  -Cotninue GLP-1 Trulicity 1.'5mg'$  (generic name: dulaglutide) to DM regimen.  Patient has been on this medication in the past and stable.  She has not been on medication due to finances. Now has medicaid until 08/2020 so medication are very affordable            -Picked up trulicity from [facility] (PA completed by PharmD prior)  -Consider Trulicity '3mg'$  sq weekly if additional glycemic control needed--would decrease basal insulin simultaneously  -Extensively discussed pathophysiology of diabetes, recommended lifestyle interventions, dietary effects on blood sugar control             -recommended to half banana intake  -Counseled on s/sx of and management of hypoglycemia  -Next A1C anticipated 44-month.    Written patient instructions provided.  Total time in face to face counseling 32 minutes.   Follow up Pharmacist Clinic Visit in 2 WEEKS. Instructed patient to call with new BG readings in order to make future adjustmeants.     JRegina Eck PharmD, BCPS Clinical Pharmacist, [place] Phone [phone]

## 2020-02-29 ENCOUNTER — Encounter: Payer: Self-pay | Admitting: Pharmacist

## 2020-03-04 ENCOUNTER — Other Ambulatory Visit: Payer: Self-pay | Admitting: Family Medicine

## 2020-03-04 DIAGNOSIS — I10 Essential (primary) hypertension: Secondary | ICD-10-CM

## 2020-03-13 ENCOUNTER — Other Ambulatory Visit: Payer: Self-pay | Admitting: Family Medicine

## 2020-03-13 DIAGNOSIS — E1169 Type 2 diabetes mellitus with other specified complication: Secondary | ICD-10-CM

## 2020-03-13 DIAGNOSIS — E785 Hyperlipidemia, unspecified: Secondary | ICD-10-CM

## 2020-04-13 ENCOUNTER — Ambulatory Visit: Payer: Medicaid Other | Admitting: Physician Assistant

## 2020-04-13 ENCOUNTER — Other Ambulatory Visit: Payer: Self-pay

## 2020-04-13 ENCOUNTER — Encounter: Payer: Self-pay | Admitting: Physician Assistant

## 2020-04-13 VITALS — BP 129/81 | HR 106 | Temp 97.0°F | Ht 61.0 in | Wt 196.0 lb

## 2020-04-13 DIAGNOSIS — R399 Unspecified symptoms and signs involving the genitourinary system: Secondary | ICD-10-CM | POA: Diagnosis not present

## 2020-04-13 LAB — URINALYSIS
Bilirubin, UA: NEGATIVE
Glucose, UA: NEGATIVE
Ketones, UA: NEGATIVE
Nitrite, UA: NEGATIVE
Protein,UA: NEGATIVE
RBC, UA: NEGATIVE
Specific Gravity, UA: 1.015 (ref 1.005–1.030)
Urobilinogen, Ur: 0.2 mg/dL (ref 0.2–1.0)
pH, UA: 6 (ref 5.0–7.5)

## 2020-04-13 MED ORDER — NITROFURANTOIN MONOHYD MACRO 100 MG PO CAPS: 100.0000 mg | ORAL_CAPSULE | Freq: Two times a day (BID) | ORAL | 0 refills | Status: DC

## 2020-04-13 NOTE — Patient Instructions (Signed)
Urinary Tract Infection, Adult A urinary tract infection (UTI) is an infection of any part of the urinary tract. The urinary tract includes:  The kidneys.  The ureters.  The bladder.  The urethra. These organs make, store, and get rid of pee (urine) in the body. What are the causes? This is caused by germs (bacteria) in your genital area. These germs grow and cause swelling (inflammation) of your urinary tract. What increases the risk? You are more likely to develop this condition if:  You have a small, thin tube (catheter) to drain pee.  You cannot control when you pee or poop (incontinence).  You are female, and: ? You use these methods to prevent pregnancy:  A medicine that kills sperm (spermicide).  A device that blocks sperm (diaphragm). ? You have low levels of a female hormone (estrogen). ? You are pregnant.  You have genes that add to your risk.  You are sexually active.  You take antibiotic medicines.  You have trouble peeing because of: ? A prostate that is bigger than normal, if you are female. ? A blockage in the part of your body that drains pee from the bladder (urethra). ? A kidney stone. ? A nerve condition that affects your bladder (neurogenic bladder). ? Not getting enough to drink. ? Not peeing often enough.  You have other conditions, such as: ? Diabetes. ? A weak disease-fighting system (immune system). ? Sickle cell disease. ? Gout. ? Injury of the spine. What are the signs or symptoms? Symptoms of this condition include:  Needing to pee right away (urgently).  Peeing often.  Peeing small amounts often.  Pain or burning when peeing.  Blood in the pee.  Pee that smells bad or not like normal.  Trouble peeing.  Pee that is cloudy.  Fluid coming from the vagina, if you are female.  Pain in the belly or lower back. Other symptoms include:  Throwing up (vomiting).  No urge to eat.  Feeling mixed up (confused).  Being tired  and grouchy (irritable).  A fever.  Watery poop (diarrhea). How is this treated? This condition may be treated with:  Antibiotic medicine.  Other medicines.  Drinking enough water. Follow these instructions at home:  Medicines  Take over-the-counter and prescription medicines only as told by your doctor.  If you were prescribed an antibiotic medicine, take it as told by your doctor. Do not stop taking it even if you start to feel better. General instructions  Make sure you: ? Pee until your bladder is empty. ? Do not hold pee for a long time. ? Empty your bladder after sex. ? Wipe from front to back after pooping if you are a female. Use each tissue one time when you wipe.  Drink enough fluid to keep your pee pale yellow.  Keep all follow-up visits as told by your doctor. This is important. Contact a doctor if:  You do not get better after 1-2 days.  Your symptoms go away and then come back. Get help right away if:  You have very bad back pain.  You have very bad pain in your lower belly.  You have a fever.  You are sick to your stomach (nauseous).  You are throwing up. Summary  A urinary tract infection (UTI) is an infection of any part of the urinary tract.  This condition is caused by germs in your genital area.  There are many risk factors for a UTI. These include having a small, thin   tube to drain pee and not being able to control when you pee or poop.  Treatment includes antibiotic medicines for germs.  Drink enough fluid to keep your pee pale yellow. This information is not intended to replace advice given to you by your health care provider. Make sure you discuss any questions you have with your health care provider. Document Revised: 09/23/2018 Document Reviewed: 04/15/2018 Elsevier Patient Education  2020 Elsevier Inc.  

## 2020-04-13 NOTE — Progress Notes (Signed)
  Subjective:     Patient ID: Casey Sanders, female   DOB: 02/23/1961, 59 y.o.   MRN: [id number]  HPI Pt with several day hx of dysuria and polyuria Last UTI ~ 2 months ago Has also noticed sl increase in her BS readings Review of Systems  Constitutional: Negative.   Gastrointestinal: Positive for abdominal pain. Negative for nausea and vomiting.  Genitourinary: Positive for decreased urine volume, dysuria and frequency. Negative for difficulty urinating, enuresis, flank pain, hematuria, pelvic pain and urgency.       Objective:   Physical Exam Vitals and nursing note reviewed.  Constitutional:      General: She is not in acute distress.    Appearance: Normal appearance. She is not ill-appearing or toxic-appearing.  Abdominal:     General: There is no distension.     Palpations: Abdomen is soft.     Tenderness: There is abdominal tenderness. There is no right CVA tenderness, left CVA tenderness, guarding or rebound.  Neurological:     Mental Status: She is alert.    UA dip neg nitrite but 1 + leuko Unable to have in office micro today    Assessment:     1. UTI symptoms        Plan:     Hydrate well Nl course reviewed Due to raise in BS and sx will go ahead and treat with Macrobid bid x 5 days Continue to monitor BS F/U prn

## 2020-04-19 LAB — URINE CULTURE

## 2020-04-20 ENCOUNTER — Other Ambulatory Visit: Payer: Self-pay | Admitting: Family Medicine

## 2020-04-20 MED ORDER — CEPHALEXIN 500 MG PO CAPS: 500.0000 mg | ORAL_CAPSULE | Freq: Two times a day (BID) | ORAL | 0 refills | Status: AC

## 2020-05-10 ENCOUNTER — Other Ambulatory Visit: Payer: Self-pay | Admitting: Family Medicine

## 2020-05-10 DIAGNOSIS — E1165 Type 2 diabetes mellitus with hyperglycemia: Secondary | ICD-10-CM

## 2020-06-11 ENCOUNTER — Other Ambulatory Visit: Payer: Self-pay | Admitting: Family Medicine

## 2020-06-11 DIAGNOSIS — M797 Fibromyalgia: Secondary | ICD-10-CM

## 2020-06-11 DIAGNOSIS — E1165 Type 2 diabetes mellitus with hyperglycemia: Secondary | ICD-10-CM

## 2020-06-30 ENCOUNTER — Other Ambulatory Visit: Payer: Self-pay | Admitting: Family Medicine

## 2020-06-30 DIAGNOSIS — M797 Fibromyalgia: Secondary | ICD-10-CM

## 2020-07-02 NOTE — Telephone Encounter (Signed)
[place]. NTBS 30 days given 06/12/20

## 2020-07-10 ENCOUNTER — Other Ambulatory Visit: Payer: Self-pay | Admitting: Family Medicine

## 2020-07-10 DIAGNOSIS — E1149 Type 2 diabetes mellitus with other diabetic neurological complication: Secondary | ICD-10-CM

## 2020-07-10 DIAGNOSIS — E1165 Type 2 diabetes mellitus with hyperglycemia: Secondary | ICD-10-CM

## 2020-07-17 ENCOUNTER — Other Ambulatory Visit: Payer: Self-pay

## 2020-07-17 ENCOUNTER — Ambulatory Visit: Payer: Medicaid Other | Admitting: Pharmacist

## 2020-07-17 DIAGNOSIS — N183 Chronic kidney disease, stage 3 unspecified: Secondary | ICD-10-CM | POA: Diagnosis not present

## 2020-07-17 DIAGNOSIS — E1122 Type 2 diabetes mellitus with diabetic chronic kidney disease: Secondary | ICD-10-CM | POA: Diagnosis not present

## 2020-07-17 LAB — BAYER DCA HB A1C WAIVED: HB A1C (BAYER DCA - WAIVED): 9.7 % — ABNORMAL HIGH (ref <7.0)

## 2020-07-17 MED ORDER — TRULICITY 3 MG/0.5ML ~~LOC~~ SOAJ
3.0000 mg | SUBCUTANEOUS | 3 refills | Status: DC
Start: 2020-07-17 — End: 2020-11-08

## 2020-07-17 MED ORDER — ONETOUCH VERIO FLEX SYSTEM W/DEVICE KIT: PACK | 0 refills | Status: DC

## 2020-07-17 MED ORDER — ONETOUCH VERIO VI STRP: ORAL_STRIP | 12 refills | Status: DC

## 2020-07-17 NOTE — Progress Notes (Signed)
    07/17/2020 Name: Casey Sanders MRN: [id number] DOB: 1960-11-26   65 yoF referred to pharmacy clinic for diabetes management  THYHOOIL:N7VJ; complicated by chronic medical conditions includingHLD,HTNmost recent A1c >14%on 4.9.21  Most recent eGFR:75  Current antihyperglycemic regimen:Lantus 30 units, trulicty 1.5mg   Denies hypoglycemic symptoms;Reports hyperglycemic symptoms, including polyuria, polydipsia, nocturia  Current meal patterns: ? Breakfast:toast, coffee, eggs ? Lunch:sandwiches, bananas ? Supper:usually meat, veggie, potato ? Snacks:bananas, fruit, crackers ? Drinks:encouraged patient to avoid sugary drinks, etc--increased water intake  Discussed meal planning options and Plate method for healthy eating . Avoid sugary drinks and desserts . Incorporate balanced protein, non starchy veggies, 1 serving of carbohydrate with each meal . Increase water intake . Increase physical activity as able   Current exercise:n/a, uses walker, h/o falls  Current blood glucose readings:FBG 150-180  Cardiovascular risk reduction: ? Current hypertensive regimen:lisinopril 10mg  ? Current hyperlipidemia regimen:atorvastatin 80mg -LDL 67 on 12/14/18 (needs repeat at next OV) Current antiplatelet regimen:ASA 81mg    O:  Lab Results  Component Value Date   HGBA1C >14.0 (H) 01/27/2020   Lipid Panel     Component Value Date/Time   CHOL 143 12/14/2018 0915   TRIG 132 12/14/2018 0915   TRIG 121 11/07/2014 1130   HDL 50 12/14/2018 0915   HDL 40 11/07/2014 1130   CHOLHDL 2.9 12/14/2018 0915   CHOLHDL 3.2 07/08/2013 0435   VLDL 31 07/08/2013 0435   LDLCALC 67 12/14/2018 0915     Home fasting blood sugars: ran out of testing strips  2 hour post-meal/random blood sugars: n/a.    A/P:  Diabetes longstandingT2DM, A1c has improved for >14% to 9.7% today (goal<7%).   Patient is able to verbalize appropriate hypoglycemia management plan. Patientis notadherent  with medication. Control is suboptimal due to dietary/lifestlye/financial constraints.  -Continue basal insulin Lantus(insulin glargine)35 units nightly   -INCREASE GLP-1 Trulicity to 3mg (generic name: dulaglutide).  Now has medicaid until 08/2020 so medication are very affordable  -Consider SGLT2 if patient remains uncontrolled on new regimen; reports blisters with Jardiance  -New One Touch Verio meter called in to [facility].  Patient's meter was >5 yrs old  -Extensively discussed pathophysiology of diabetes, recommended lifestyle interventions, dietary effects on blood sugar control  -Counseled on s/sx of and management of hypoglycemia  -Next A1C anticipated 3 months.  Written patient instructions provided.  Total time in face to face counseling 30 minutes.   Follow up PCP Clinic Visit tom for UTI symptoms; needs to make 3 month f/u for A1c.     Regina Eck, PharmD, BCPS Clinical Pharmacist, [place]  II Phone [phone]

## 2020-07-18 ENCOUNTER — Other Ambulatory Visit: Payer: Self-pay | Admitting: Family Medicine

## 2020-07-18 ENCOUNTER — Encounter: Payer: Self-pay | Admitting: Family Medicine

## 2020-07-18 ENCOUNTER — Ambulatory Visit: Payer: Medicaid Other | Admitting: Family Medicine

## 2020-07-18 ENCOUNTER — Telehealth: Payer: Self-pay | Admitting: Pharmacist

## 2020-07-18 VITALS — BP 114/71 | HR 104 | Temp 96.9°F | Ht 61.0 in | Wt 192.6 lb

## 2020-07-18 DIAGNOSIS — R0781 Pleurodynia: Secondary | ICD-10-CM | POA: Diagnosis not present

## 2020-07-18 DIAGNOSIS — R300 Dysuria: Secondary | ICD-10-CM

## 2020-07-18 DIAGNOSIS — N3001 Acute cystitis with hematuria: Secondary | ICD-10-CM

## 2020-07-18 LAB — URINALYSIS, COMPLETE
Bilirubin, UA: NEGATIVE
Nitrite, UA: POSITIVE — AB
Specific Gravity, UA: 1.02 (ref 1.005–1.030)
Urobilinogen, Ur: 1 mg/dL (ref 0.2–1.0)
pH, UA: 5 (ref 5.0–7.5)

## 2020-07-18 LAB — MICROSCOPIC EXAMINATION
RBC, Urine: NONE SEEN /hpf (ref 0–2)
WBC, UA: >30 /hpf — AB (ref 0–5)

## 2020-07-18 MED ORDER — ACCU-CHEK GUIDE ME W/DEVICE KIT: PACK | 0 refills | Status: DC

## 2020-07-18 MED ORDER — ACCU-CHEK GUIDE VI STRP: ORAL_STRIP | 12 refills | Status: DC

## 2020-07-18 MED ORDER — CEPHALEXIN 500 MG PO CAPS
500.0000 mg | ORAL_CAPSULE | Freq: Two times a day (BID) | ORAL | 0 refills | Status: AC
Start: 2020-07-18 — End: 2020-07-23

## 2020-07-18 MED ORDER — ACCU-CHEK SOFTCLIX LANCETS MISC: 12 refills | Status: DC

## 2020-07-18 MED ORDER — ACCU-CHEK SOFTCLIX LANCETS MISC: 12 refills | Status: AC

## 2020-07-18 NOTE — Addendum Note (Signed)
Addended by: Lottie Dawson D on: 07/18/2020 01:54 PM   Modules accepted: Orders

## 2020-07-18 NOTE — Telephone Encounter (Signed)
Accu-chek guide device/kit, lancets and strips called in to [facility] Covered under insurance

## 2020-07-18 NOTE — Patient Instructions (Signed)
Urinary Tract Infection, Adult A urinary tract infection (UTI) is an infection of any part of the urinary tract. The urinary tract includes:  The kidneys.  The ureters.  The bladder.  The urethra. These organs make, store, and get rid of pee (urine) in the body. What are the causes? This is caused by germs (bacteria) in your genital area. These germs grow and cause swelling (inflammation) of your urinary tract. What increases the risk? You are more likely to develop this condition if:  You have a small, thin tube (catheter) to drain pee.  You cannot control when you pee or poop (incontinence).  You are female, and: ? You use these methods to prevent pregnancy:  A medicine that kills sperm (spermicide).  A device that blocks sperm (diaphragm). ? You have low levels of a female hormone (estrogen). ? You are pregnant.  You have genes that add to your risk.  You are sexually active.  You take antibiotic medicines.  You have trouble peeing because of: ? A prostate that is bigger than normal, if you are female. ? A blockage in the part of your body that drains pee from the bladder (urethra). ? A kidney stone. ? A nerve condition that affects your bladder (neurogenic bladder). ? Not getting enough to drink. ? Not peeing often enough.  You have other conditions, such as: ? Diabetes. ? A weak disease-fighting system (immune system). ? Sickle cell disease. ? Gout. ? Injury of the spine. What are the signs or symptoms? Symptoms of this condition include:  Needing to pee right away (urgently).  Peeing often.  Peeing small amounts often.  Pain or burning when peeing.  Blood in the pee.  Pee that smells bad or not like normal.  Trouble peeing.  Pee that is cloudy.  Fluid coming from the vagina, if you are female.  Pain in the belly or lower back. Other symptoms include:  Throwing up (vomiting).  No urge to eat.  Feeling mixed up (confused).  Being tired  and grouchy (irritable).  A fever.  Watery poop (diarrhea). How is this treated? This condition may be treated with:  Antibiotic medicine.  Other medicines.  Drinking enough water. Follow these instructions at home:  Medicines  Take over-the-counter and prescription medicines only as told by your doctor.  If you were prescribed an antibiotic medicine, take it as told by your doctor. Do not stop taking it even if you start to feel better. General instructions  Make sure you: ? Pee until your bladder is empty. ? Do not hold pee for a long time. ? Empty your bladder after sex. ? Wipe from front to back after pooping if you are a female. Use each tissue one time when you wipe.  Drink enough fluid to keep your pee pale yellow.  Keep all follow-up visits as told by your doctor. This is important. Contact a doctor if:  You do not get better after 1-2 days.  Your symptoms go away and then come back. Get help right away if:  You have very bad back pain.  You have very bad pain in your lower belly.  You have a fever.  You are sick to your stomach (nauseous).  You are throwing up. Summary  A urinary tract infection (UTI) is an infection of any part of the urinary tract.  This condition is caused by germs in your genital area.  There are many risk factors for a UTI. These include having a small, thin   tube to drain pee and not being able to control when you pee or poop.  Treatment includes antibiotic medicines for germs.  Drink enough fluid to keep your pee pale yellow. This information is not intended to replace advice given to you by your health care provider. Make sure you discuss any questions you have with your health care provider. Document Revised: 09/23/2018 Document Reviewed: 04/15/2018 Elsevier Patient Education  2020 Elsevier Inc.  

## 2020-07-18 NOTE — Progress Notes (Signed)
Assessment & Plan:  1. Acute cystitis with hematuria - Education provided on UTIs. Encouraged adequate hydration.  - cephALEXin (KEFLEX) 500 MG capsule; Take 1 capsule (500 mg total) by mouth 2 (two) times daily for 5 days.  Dispense: 10 capsule; Refill: 0  2. Dysuria - Urinalysis, Complete - Urine Culture  3. Rib pain on right side - Encouraged NSAIDs for pain.   Follow up plan: Return if symptoms worsen or fail to improve.  Hendricks Limes, MSN, APRN, FNP-C [facility]  Subjective:   Patient ID: Casey Sanders, female    DOB: 02/23/1961, 59 y.o.   MRN: [id number]  HPI: Casey Sanders is a 59 y.o. female presenting on 07/18/2020 for Hematuria (x 4 days) and Dysuria (x 4 days)  Urinary Tract Infection: Patient complains of dysuria, hematuria and suprapubic pressure. She has had symptoms for 4 days. Patient also complains of back pain. Patient denies fever. Patient does have a history of recurrent UTI.  Patient does have a history of pyelonephritis.   Patient reports bruising to the right side of her ribs that causes pain with activity. She does not recall an injury. She has not taken anything for the pain.   ROS: Negative unless specifically indicated above in HPI.   Relevant past medical history reviewed and updated as indicated.   Allergies and medications reviewed and updated.   Current Outpatient Medications:    albuterol (VENTOLIN HFA) 108 (90 Base) MCG/ACT inhaler, Inhale 1-2 puffs into the lungs every 6 (six) hours as needed for wheezing or shortness of breath., Disp: 1 each, Rfl: 1   amitriptyline (ELAVIL) 25 MG tablet, Take one or two tablets at bedtime. (Patient taking differently: Take 25-50 mg by mouth at bedtime. ), Disp: 60 tablet, Rfl: 0   aspirin 81 MG EC tablet, Take 81 mg by mouth daily.  , Disp: , Rfl:    atorvastatin (LIPITOR) 80 MG tablet, TAKE 1 TABLET BY MOUTH ONCE DAILY AT 6 PM, Disp: 90 tablet, Rfl: 1   Blood Glucose  Monitoring Suppl (ONETOUCH VERIO FLEX SYSTEM) w/Device KIT, Use to test blood sugar 3 times daily. DX: E11.9, Disp: 1 kit, Rfl: 0   clonazePAM (KLONOPIN) 2 MG tablet, Take 2 mg by mouth 2 (two) times daily., Disp: , Rfl:    clotrimazole (LOTRIMIN) 1 % cream, Apply 1 application topically 2 (two) times daily. (to external groin) x2-4 weeks, Disp: 30 g, Rfl: 1   cyclobenzaprine (FLEXERIL) 10 MG tablet, Take 10 mg by mouth 3 (three) times daily as needed for muscle spasms. , Disp: , Rfl: 1   diclofenac sodium (VOLTAREN) 1 % GEL, Apply 2 g topically 4 (four) times daily., Disp: 100 g, Rfl: 1   Dulaglutide (TRULICITY) 3 NL/9.7QB SOPN, Inject 3 mg as directed once a week., Disp: 0.5 mL, Rfl: 3   FINGERSTIX LANCETS MISC, Use to check BG up to bid.  Dx:  250.00, Disp: 100 each, Rfl: 1   fish oil-omega-3 fatty acids 1000 MG capsule, Take 2 g by mouth daily.  , Disp: , Rfl:    gabapentin (NEURONTIN) 400 MG capsule, Take 1 capsule (400 mg total) by mouth 4 (four) times daily. (Needs to be seen before next refill), Disp: 120 capsule, Rfl: 0   glucose blood (ONETOUCH VERIO) test strip, Use to test blood sugar 3 times daily. DX: E11.9, Disp: 100 each, Rfl: 12   glucose blood (TRUETRACK TEST) test strip, Use to check BG up to bid.  Dx:  250.00, Disp: 100 each, Rfl: 1   Insulin Pen Needle (NOVOFINE) 32G X 6 MM MISC, UAD to inject Victoza and Lantus daily. (Patient taking differently: UAD Lantus daily.), Disp: 200 each, Rfl: 1   LANTUS SOLOSTAR 100 UNIT/ML Solostar Pen, INJECT 48 UNITS SUBCUTANEOUSLY ONCE DAILY AT 10 PM, Disp: 15 mL, Rfl: 0   lisinopril (ZESTRIL) 10 MG tablet, Take 1 tablet by mouth once daily, Disp: 90 tablet, Rfl: 1   nitrofurantoin, macrocrystal-monohydrate, (MACROBID) 100 MG capsule, Take 1 capsule (100 mg total) by mouth 2 (two) times daily. 1 po BId, Disp: 10 capsule, Rfl: 0   sertraline (ZOLOFT) 100 MG tablet, Take 100 mg by mouth 2 (two) times daily. , Disp: , Rfl:    traMADol  (ULTRAM) 50 MG tablet, Take 50 mg by mouth every 6 (six) hours as needed. , Disp: , Rfl: 1  Allergies  Allergen Reactions   Codeine Hives, Nausea And Vomiting and Other (See Comments)   Jardiance [Empagliflozin]     Caused blisters   Metformin And Related     nausea   Niaspan [Niacin Er] Other (See Comments)    Increase blood glucose    Objective:   BP 114/71    Pulse (!) 104    Temp (!) 96.9 F (36.1 C) (Temporal)    Ht _0  (1.549 m)    Wt 192 lb 9.6 oz (87.4 kg)    SpO2 96%    BMI 36.39 kg/m    Physical Exam Vitals reviewed.  Constitutional:      General: She is not in acute distress.    Appearance: Normal appearance. She is not ill-appearing, toxic-appearing or diaphoretic.  HENT:     Head: Normocephalic and atraumatic.  Eyes:     General: No scleral icterus.       Right eye: No discharge.        Left eye: No discharge.     Conjunctiva/sclera: Conjunctivae normal.  Cardiovascular:     Rate and Rhythm: Normal rate.  Pulmonary:     Effort: Pulmonary effort is normal. No respiratory distress.  Abdominal:     Tenderness: There is no right CVA tenderness or left CVA tenderness.  Musculoskeletal:        General: Normal range of motion.     Cervical back: Normal range of motion.  Skin:    General: Skin is warm and dry.     Capillary Refill: Capillary refill takes less than 2 seconds.     Findings: Bruising (right side of ribs) present.  Neurological:     General: No focal deficit present.     Mental Status: She is alert and oriented to person, place, and time. Mental status is at baseline.  Psychiatric:        Mood and Affect: Mood normal.        Behavior: Behavior normal.        Thought Content: Thought content normal.        Judgment: Judgment normal.

## 2020-07-21 LAB — URINE CULTURE

## 2020-08-20 ENCOUNTER — Other Ambulatory Visit: Payer: Self-pay | Admitting: Family Medicine

## 2020-08-20 DIAGNOSIS — M797 Fibromyalgia: Secondary | ICD-10-CM

## 2020-09-16 ENCOUNTER — Other Ambulatory Visit: Payer: Self-pay | Admitting: Family Medicine

## 2020-09-16 DIAGNOSIS — E1165 Type 2 diabetes mellitus with hyperglycemia: Secondary | ICD-10-CM

## 2020-09-24 ENCOUNTER — Ambulatory Visit (INDEPENDENT_AMBULATORY_CARE_PROVIDER_SITE_OTHER): Payer: Medicaid Other | Admitting: Family Medicine

## 2020-09-24 DIAGNOSIS — R3989 Other symptoms and signs involving the genitourinary system: Secondary | ICD-10-CM | POA: Diagnosis not present

## 2020-09-24 DIAGNOSIS — E785 Hyperlipidemia, unspecified: Secondary | ICD-10-CM

## 2020-09-24 DIAGNOSIS — E1169 Type 2 diabetes mellitus with other specified complication: Secondary | ICD-10-CM

## 2020-09-24 MED ORDER — ATORVASTATIN CALCIUM 80 MG PO TABS: 80.0000 mg | ORAL_TABLET | Freq: Every day | ORAL | 1 refills | Status: DC

## 2020-09-24 MED ORDER — CEPHALEXIN 500 MG PO CAPS: 500.0000 mg | ORAL_CAPSULE | Freq: Two times a day (BID) | ORAL | 0 refills | Status: AC

## 2020-09-24 NOTE — Progress Notes (Signed)
Telephone visit  Subjective: CC: UTI? PCP: Gottschalk, Ashly M, DO HPI:Casey Sanders is a 59 y.o. female calls for telephone consult today. Patient provides verbal consent for consult held via phone.  Due to COVID-19 pandemic this visit was conducted virtually. This visit type was conducted due to national recommendations for restrictions regarding the COVID-19 Pandemic (e.g. social distancing, sheltering in place) in an effort to limit this patient's exposure and mitigate transmission in our community. All issues noted in this document were discussed and addressed.  A physical exam was not performed with this format.   Location of patient: home Location of provider: [facility] Others present for call: none  1. Urinary symptoms Patient reports a 2 week h/o dysuria, urinary frequency, urgency.  UOP normal.  No hematuria, fevers, chills, abdominal pain, nausea, vomiting, back pain, vaginal discharge.  Patient has used AZO for symptoms.  Her sugar has been 149-150's but this am it was in the 200s.   ROS: Per HPI  Allergies  Allergen Reactions  . Codeine Hives, Nausea And Vomiting and Other (See Comments)  . Jardiance [Empagliflozin]     Caused blisters  . Metformin And Related     nausea  . Niaspan [Niacin Er] Other (See Comments)    Increase blood glucose   Past Medical History:  Diagnosis Date  . Allergic rhinitis   . Anxiety and depression   . Asthma   . Benign essential HTN 01/04/2011  . Chest pain    a. Myoview 9/05: no ischemia, no scar, EF 70%  . Chest pain, unspecified 02/12/2011  . Cystitis 02/08/2015  . Diverticulosis   . DM2 (diabetes mellitus, type 2) (HCC)   . Extremity pain 01/04/2013  . Fibromyalgia   . Frequent UTI   . GERD (gastroesophageal reflux disease)    Hiatal Hernia  . HLD (hyperlipidemia)   . HTN (hypertension)   . Left rotator cuff tear   . Low back pain 01/04/2013  . Lumbar disc disease   . Nephrolithiasis   . Obesity   . Palpitations    monitor  9/05: NSR, sinus tachy, PVCs  . Pre-operative cardiovascular examination 02/12/2011  . Rectal bleeding 08/22/2013  . Sepsis secondary to UTI (HCC) 08/09/2017    Current Outpatient Medications:  .  Accu-Chek Softclix Lancets lancets, Please use to test blood sugar 3 times daily as directed. DX: E11.9, Disp: 100 each, Rfl: 12 .  albuterol (VENTOLIN HFA) 108 (90 Base) MCG/ACT inhaler, Inhale 1-2 puffs into the lungs every 6 (six) hours as needed for wheezing or shortness of breath., Disp: 1 each, Rfl: 1 .  amitriptyline (ELAVIL) 25 MG tablet, Take one or two tablets at bedtime. (Patient taking differently: Take 25-50 mg by mouth at bedtime. ), Disp: 60 tablet, Rfl: 0 .  aspirin 81 MG EC tablet, Take 81 mg by mouth daily.  , Disp: , Rfl:  .  atorvastatin (LIPITOR) 80 MG tablet, TAKE 1 TABLET BY MOUTH ONCE DAILY AT 6 PM, Disp: 90 tablet, Rfl: 1 .  Blood Glucose Monitoring Suppl (ACCU-CHEK GUIDE ME) w/Device KIT, Please use to test blood sugar 3 times daily as directed. DX: E11.9, Disp: 1 kit, Rfl: 0 .  clonazePAM (KLONOPIN) 2 MG tablet, Take 2 mg by mouth 2 (two) times daily., Disp: , Rfl:  .  clotrimazole (LOTRIMIN) 1 % cream, Apply 1 application topically 2 (two) times daily. (to external groin) x2-4 weeks, Disp: 30 g, Rfl: 1 .  cyclobenzaprine (FLEXERIL) 10 MG tablet, Take 10 mg by   mouth 3 (three) times daily as needed for muscle spasms. , Disp: , Rfl: 1 .  diclofenac sodium (VOLTAREN) 1 % GEL, Apply 2 g topically 4 (four) times daily., Disp: 100 g, Rfl: 1 .  Dulaglutide (TRULICITY) 3 KC/1.2XN SOPN, Inject 3 mg as directed once a week., Disp: 0.5 mL, Rfl: 3 .  fish oil-omega-3 fatty acids 1000 MG capsule, Take 2 g by mouth daily.  , Disp: , Rfl:  .  gabapentin (NEURONTIN) 400 MG capsule, TAKE 1 CAPSULE BY MOUTH 4 TIMES DAILY. NEEDS TO BE SEEN BEFORE NEXT REFILL, Disp: 120 capsule, Rfl: 0 .  glucose blood (ACCU-CHEK GUIDE) test strip, Please use to test blood sugar 3 times daily as directed. DX:  E11.9, Disp: 100 each, Rfl: 12 .  Insulin Pen Needle (NOVOFINE) 32G X 6 MM MISC, UAD to inject Victoza and Lantus daily. (Patient taking differently: UAD Lantus daily.), Disp: 200 each, Rfl: 1 .  LANTUS SOLOSTAR 100 UNIT/ML Solostar Pen, INJECT 48 UNITS SUBCUTANEOUSLY ONCE DAILY AT 10 PM, Disp: 15 mL, Rfl: 0 .  lisinopril (ZESTRIL) 10 MG tablet, Take 1 tablet by mouth once daily, Disp: 90 tablet, Rfl: 1 .  nitrofurantoin, macrocrystal-monohydrate, (MACROBID) 100 MG capsule, Take 1 capsule (100 mg total) by mouth 2 (two) times daily. 1 po BId, Disp: 10 capsule, Rfl: 0 .  sertraline (ZOLOFT) 100 MG tablet, Take 100 mg by mouth 2 (two) times daily. , Disp: , Rfl:  .  traMADol (ULTRAM) 50 MG tablet, Take 50 mg by mouth every 6 (six) hours as needed. , Disp: , Rfl: 1  Assessment/ Plan: 59 y.o. female   Suspected UTI - Plan: cephALEXin (KEFLEX) 500 MG capsule  Hyperlipidemia associated with type 2 diabetes mellitus ([place]) - Plan: atorvastatin (LIPITOR) 80 MG tablet  Empiric treatment for presumed UTI.  We discussed that if symptoms were not improving over the next 48 hours I would recommend that she be evaluated in the office that we can obtain a urine sample.  She voiced good understanding of the plan and will follow up as needed  Start time: 1:29pm End time: 1:38pm  Total time spent on patient care (including telephone call/ virtual visit): 9 minutes  Vance, Edie [phone]

## 2020-09-30 ENCOUNTER — Other Ambulatory Visit: Payer: Self-pay | Admitting: Family Medicine

## 2020-09-30 DIAGNOSIS — E785 Hyperlipidemia, unspecified: Secondary | ICD-10-CM

## 2020-09-30 DIAGNOSIS — E1169 Type 2 diabetes mellitus with other specified complication: Secondary | ICD-10-CM

## 2020-10-15 ENCOUNTER — Telehealth: Payer: Self-pay

## 2020-10-15 DIAGNOSIS — J45909 Unspecified asthma, uncomplicated: Secondary | ICD-10-CM

## 2020-10-15 MED ORDER — ALBUTEROL SULFATE HFA 108 (90 BASE) MCG/ACT IN AERS: 1.0000 | INHALATION_SPRAY | Freq: Four times a day (QID) | RESPIRATORY_TRACT | 0 refills | Status: DC | PRN

## 2020-10-15 NOTE — Telephone Encounter (Signed)
  Prescription Request  10/15/2020  What is the name of the medication or equipment? Albuterol 108 (90 Base) MCG/ACT inhaler. Patient has misplaced hers and needs one called in. Has appt 2-3 with Gottshcalk  Have you contacted your pharmacy to request a refill? (if applicable) YES  Which pharmacy would you like this sent to? [facility] in [place]   Patient notified that their request is being sent to the clinical staff for review and that they should receive a response within 2 business days.

## 2020-10-15 NOTE — Telephone Encounter (Signed)
Pt aware refill sent to pharmacy 

## 2020-11-05 ENCOUNTER — Other Ambulatory Visit: Payer: Self-pay | Admitting: Family Medicine

## 2020-11-05 DIAGNOSIS — E1169 Type 2 diabetes mellitus with other specified complication: Secondary | ICD-10-CM

## 2020-11-06 ENCOUNTER — Ambulatory Visit (INDEPENDENT_AMBULATORY_CARE_PROVIDER_SITE_OTHER): Payer: Medicare Other | Admitting: Pharmacist

## 2020-11-06 ENCOUNTER — Other Ambulatory Visit: Payer: Self-pay | Admitting: Family Medicine

## 2020-11-06 DIAGNOSIS — E119 Type 2 diabetes mellitus without complications: Secondary | ICD-10-CM | POA: Diagnosis not present

## 2020-11-06 DIAGNOSIS — I10 Essential (primary) hypertension: Secondary | ICD-10-CM

## 2020-11-06 DIAGNOSIS — M797 Fibromyalgia: Secondary | ICD-10-CM

## 2020-11-06 DIAGNOSIS — E1169 Type 2 diabetes mellitus with other specified complication: Secondary | ICD-10-CM

## 2020-11-06 DIAGNOSIS — E785 Hyperlipidemia, unspecified: Secondary | ICD-10-CM

## 2020-11-06 NOTE — Progress Notes (Signed)
    11/06/2020 Name: Saaya Procell MRN: [id number] DOB: 06-18-1961   S:  19 yoF Presents for diabetes evaluation, education, and management Patient was referred and last seen by Primary Care Provider on 07/18/20.  Insurance coverage/medication affordability: [employer] medicare  Patient visit virtually in the context of Covid-19 pandemic.   I connected with [name] on 11/06/20 at 10:15am by vide/telephone and verified that I am speaking with the correct person using two identifiers.   I discussed the limitations, risks, security and privacy concerns of performing an evaluation and management service by video and the availability of in person appointments. I also discussed with the patient that there may be a patient responsible charge related to this service. The patient expressed understanding and agreed to proceed.   Patient location:  home My Location:  home Persons on the video call:  2   Patient reports adherence with medications. . Current diabetes medications include: trulicity, lantus o Intolerance to metformin, jardiance . Current hypertension medications include: lisinopril Goal 130/80 . Current hyperlipidemia medications include: atorvastatin  Patient denies hypoglycemic events.   Patient reported dietary habits: Eats 2-3 meals/day  Patient-reported exercise habits: n/a  O:  Lab Results  Component Value Date   HGBA1C 9.7 (H) 07/17/2020   Lipid Panel     Component Value Date/Time   CHOL 143 12/14/2018 0915   TRIG 132 12/14/2018 0915   TRIG 121 11/07/2014 1130   HDL 50 12/14/2018 0915   HDL 40 11/07/2014 1130   CHOLHDL 2.9 12/14/2018 0915   CHOLHDL 3.2 07/08/2013 0435   VLDL 31 07/08/2013 0435   LDLCALC 67 12/14/2018 0915     Home fasting blood sugars: <150  2 hour post-meal/random blood sugars: <180.  The 10-year ASCVD risk score Mikey Bussing DC Brooke Bonito., et al., 2013) is: 6.2%   Values used to calculate the score:     Age: 87 years     Sex: Female     Is  Non-Hispanic African American: No     Diabetic: Yes     Tobacco smoker: No     Systolic Blood Pressure: 585 mmHg     Is BP treated: Yes     HDL Cholesterol: 50 mg/dL     Total Cholesterol: 143 mg/dL    A/P:  Diabetes T2DM, however glycemic control appears to be improving per patient report.  She will have repeat A1c next month.  Anticipate A1C to be closer to goal of 7%.  Patient is adherent with medication.  -Continue lantus 30 units daily  -Continue Trulicity 3 mg sq weekly  -Extensively discussed pathophysiology of diabetes, recommended lifestyle interventions, dietary effects on blood sugar control  -Counseled on s/sx of and management of hypoglycemia  -Next A1C anticipated 2 weeks.  Written patient instructions provided.  Total time in counseling 18 minutes.   Follow up PCP Clinic Visit ON 11/22/20  Regina Eck, PharmD, BCPS Clinical Pharmacist, [place]  II Phone [phone]

## 2020-11-07 ENCOUNTER — Other Ambulatory Visit: Payer: Self-pay | Admitting: Family Medicine

## 2020-11-10 ENCOUNTER — Other Ambulatory Visit: Payer: Self-pay | Admitting: Family Medicine

## 2020-11-10 DIAGNOSIS — E1169 Type 2 diabetes mellitus with other specified complication: Secondary | ICD-10-CM

## 2020-11-10 DIAGNOSIS — I10 Essential (primary) hypertension: Secondary | ICD-10-CM

## 2020-11-21 ENCOUNTER — Other Ambulatory Visit: Payer: Self-pay | Admitting: *Deleted

## 2020-11-21 MED ORDER — TRULICITY 3 MG/0.5ML ~~LOC~~ SOAJ: 3.0000 mg | SUBCUTANEOUS | 0 refills | Status: DC

## 2020-11-22 ENCOUNTER — Ambulatory Visit: Payer: Medicaid Other | Admitting: Family Medicine

## 2020-11-28 ENCOUNTER — Ambulatory Visit (INDEPENDENT_AMBULATORY_CARE_PROVIDER_SITE_OTHER): Payer: Medicare Other | Admitting: Family Medicine

## 2020-11-28 ENCOUNTER — Other Ambulatory Visit: Payer: Self-pay

## 2020-11-28 VITALS — BP 134/85 | HR 110 | Temp 96.9°F | Ht 61.0 in | Wt 186.0 lb

## 2020-11-28 DIAGNOSIS — E1159 Type 2 diabetes mellitus with other circulatory complications: Secondary | ICD-10-CM | POA: Diagnosis not present

## 2020-11-28 DIAGNOSIS — E1165 Type 2 diabetes mellitus with hyperglycemia: Secondary | ICD-10-CM | POA: Diagnosis not present

## 2020-11-28 DIAGNOSIS — L600 Ingrowing nail: Secondary | ICD-10-CM

## 2020-11-28 DIAGNOSIS — I152 Hypertension secondary to endocrine disorders: Secondary | ICD-10-CM | POA: Diagnosis not present

## 2020-11-28 DIAGNOSIS — E1169 Type 2 diabetes mellitus with other specified complication: Secondary | ICD-10-CM | POA: Diagnosis not present

## 2020-11-28 DIAGNOSIS — E039 Hypothyroidism, unspecified: Secondary | ICD-10-CM

## 2020-11-28 DIAGNOSIS — E785 Hyperlipidemia, unspecified: Secondary | ICD-10-CM

## 2020-11-28 LAB — BAYER DCA HB A1C WAIVED: HB A1C (BAYER DCA - WAIVED): 7.4 % — ABNORMAL HIGH (ref <7.0)

## 2020-11-28 NOTE — Progress Notes (Signed)
Subjective: CC: DM PCP: [name], DO HAF:BXUXYBF Widmer is a 60 y.o. female presenting to clinic today for:  1. Type 2 Diabetes with hypertension, hyperlipidemia:  Patient reports compliance with Trulicity 3 mg each week, Lantus 30 units nightly.  She is compliant with her Lipitor, lisinopril.  She has been losing weight and is grateful for this.  To summarize she had had an increase in her Trulicity dose about 1 month ago.  She has been doing well on this and does not report any severe abdominal pain, nausea, vomiting.  Last eye exam: Needs Last foot exam: Needs Last A1c:  Lab Results  Component Value Date   HGBA1C 9.7 (H) 07/17/2020   Nephropathy screen indicated?:  On ACE inhibitor Last flu, zoster and/or pneumovax:  Immunization History  Administered Date(s) Administered  . Influenza Whole 10/20/2006  . Influenza,inj,Quad PF,6+ Mos 08/22/2013, 09/07/2014, 07/15/2016, 08/20/2017, 07/05/2019  . Pneumococcal Conjugate-13 09/07/2014  . Pneumococcal Polysaccharide-23 07/15/2016  . Td 10/20/2002    2. Hypothyroidism Not treated with thyroid medication.  Her TSH has waxed and waned between normal and abnormal over the last several years.  She is here for interval checkup.  She is been having weight loss as above but this is likely secondary to Trulicity.  No reports of change in bowel habit, energy.   ROS: Per HPI  Allergies  Allergen Reactions  . Codeine Hives, Nausea And Vomiting and Other (See Comments)  . Jardiance [Empagliflozin]     Caused blisters  . Metformin And Related     nausea  . Niaspan [Niacin Er] Other (See Comments)    Increase blood glucose   Past Medical History:  Diagnosis Date  . Allergic rhinitis   . Anxiety and depression   . Asthma   . Benign essential HTN 01/04/2011  . Chest pain    a. Myoview 9/05: no ischemia, no scar, EF 70%  . Chest pain, unspecified 02/12/2011  . Cystitis 02/08/2015  . Diverticulosis   . DM2 (diabetes  mellitus, type 2) ([place])   . Extremity pain 01/04/2013  . Fibromyalgia   . Frequent UTI   . GERD (gastroesophageal reflux disease)    Hiatal Hernia  . HLD (hyperlipidemia)   . HTN (hypertension)   . Left rotator cuff tear   . Low back pain 01/04/2013  . Lumbar disc disease   . Nephrolithiasis   . Obesity   . Palpitations    monitor 9/05: NSR, sinus tachy, PVCs  . Pre-operative cardiovascular examination 02/12/2011  . Rectal bleeding 08/22/2013  . Sepsis secondary to UTI ([place]) 08/09/2017    Current Outpatient Medications:  .  Accu-Chek Softclix Lancets lancets, Please use to test blood sugar 3 times daily as directed. DX: E11.9, Disp: 100 each, Rfl: 12 .  albuterol (VENTOLIN HFA) 108 (90 Base) MCG/ACT inhaler, Inhale 1-2 puffs into the lungs every 6 (six) hours as needed for wheezing or shortness of breath., Disp: 8 g, Rfl: 0 .  amitriptyline (ELAVIL) 25 MG tablet, Take one or two tablets at bedtime. (Patient taking differently: Take 25-50 mg by mouth at bedtime. ), Disp: 60 tablet, Rfl: 0 .  aspirin 81 MG EC tablet, Take 81 mg by mouth daily.  , Disp: , Rfl:  .  atorvastatin (LIPITOR) 80 MG tablet, TAKE 1 TABLET BY MOUTH ONCE DAILY AT 6 PM, Disp: 90 tablet, Rfl: 0 .  Blood Glucose Monitoring Suppl (ACCU-CHEK GUIDE ME) w/Device KIT, Please use to test blood sugar 3 times daily  as directed. DX: E11.9, Disp: 1 kit, Rfl: 0 .  clonazePAM (KLONOPIN) 2 MG tablet, Take 2 mg by mouth 2 (two) times daily., Disp: , Rfl:  .  clotrimazole (LOTRIMIN) 1 % cream, Apply 1 application topically 2 (two) times daily. (to external groin) x2-4 weeks, Disp: 30 g, Rfl: 1 .  cyclobenzaprine (FLEXERIL) 10 MG tablet, Take 10 mg by mouth 3 (three) times daily as needed for muscle spasms. , Disp: , Rfl: 1 .  diclofenac sodium (VOLTAREN) 1 % GEL, Apply 2 g topically 4 (four) times daily., Disp: 100 g, Rfl: 1 .  Dulaglutide (TRULICITY) 3 YQ/6.5HQ SOPN, Inject 3 mg into the skin once a week., Disp: 6.5 mL, Rfl: 0 .  fish  oil-omega-3 fatty acids 1000 MG capsule, Take 2 g by mouth daily.  , Disp: , Rfl:  .  gabapentin (NEURONTIN) 400 MG capsule, Take 1 capsule by mouth 4 times daily, Disp: 120 capsule, Rfl: 0 .  glucose blood (ACCU-CHEK GUIDE) test strip, Please use to test blood sugar 3 times daily as directed. DX: E11.9, Disp: 100 each, Rfl: 12 .  Insulin Pen Needle (NOVOFINE) 32G X 6 MM MISC, UAD to inject Victoza and Lantus daily. (Patient taking differently: UAD Lantus daily.), Disp: 200 each, Rfl: 1 .  LANTUS SOLOSTAR 100 UNIT/ML Solostar Pen, INJECT 48 UNITS SUBCUTANEOUSLY ONCE DAILY AT 10 PM (Patient taking differently: 30 Units.), Disp: 15 mL, Rfl: 0 .  lisinopril (ZESTRIL) 10 MG tablet, Take 1 tablet by mouth once daily, Disp: 90 tablet, Rfl: 0 .  sertraline (ZOLOFT) 100 MG tablet, Take 100 mg by mouth 2 (two) times daily. , Disp: , Rfl:  .  traMADol (ULTRAM) 50 MG tablet, Take 50 mg by mouth every 6 (six) hours as needed. , Disp: , Rfl: 1 Social History   Socioeconomic History  . Marital status: Married    Spouse name: Not on file  . Number of children: 2  . Years of education: Not on file  . Highest education level: Not on file  Occupational History  . Occupation: disabled  Tobacco Use  . Smoking status: Former Research scientist (life sciences)  . Smokeless tobacco: Never Used  Vaping Use  . Vaping Use: Never used  Substance and Sexual Activity  . Alcohol use: No  . Drug use: No  . Sexual activity: Not on file  Other Topics Concern  . Not on file  Social History Narrative  . Not on file   Social Determinants of Health   Financial Resource Strain: Not on file  Food Insecurity: Not on file  Transportation Needs: Not on file  Physical Activity: Not on file  Stress: Not on file  Social Connections: Not on file  Intimate Partner Violence: Not on file   Family History  Problem Relation Age of Onset  . Coronary artery disease Mother   . Coronary artery disease Father     Objective: Office vital signs  reviewed. BP 134/85   Pulse (!) 110   Temp (!) 96.9 F (36.1 C) (Temporal)   Ht '5\' 1"'  (1.549 m)   Wt 186 lb (84.4 kg)   SpO2 93%   BMI 35.14 kg/m   Physical Examination:  General: Awake, alert, well nourished, No acute distress HEENT: Normal; no exophthalmos.  No goiter Cardio: regular rate and rhythm, S1S2 heard, no murmurs appreciated Pulm: clear to auscultation bilaterally, no wheezes, rhonchi or rales; normal work of breathing on room air Extremities: warm, well perfused, No edema, cyanosis or clubbing; +2  pulses bilaterally MSK: slow gait and normal station; utilizes a walker for ambulation Skin: dry; intact; no rashes or lesions; normal temperature Neuro: See DM foot exam Diabetic Foot Exam - Simple   Simple Foot Form Diabetic Foot exam was performed with the following findings: Yes 11/28/2020  3:00 PM  Visual Inspection See comments: Yes Sensation Testing Intact to touch and monofilament testing bilaterally: Yes Pulse Check Posterior Tibialis and Dorsalis pulse intact bilaterally: Yes Comments Monofilament sensation intact.  She does have dry skin of the feet bilaterally but no overt ulceration or preulcerative calluses.  She does have what appear to be healing ingrown toenails on bilateral feet with left foot medially and right foot laterally.      Assessment/ Plan: 60 y.o. female   Uncontrolled type 2 diabetes mellitus with hyperglycemia ([place]) - Plan: CMP14+EGFR, Bayer DCA Hb A1c Waived  Hyperlipidemia associated with type 2 diabetes mellitus ([place]) - Plan: CMP14+EGFR  Hypothyroidism, unspecified type - Plan: Thyroid Panel With TSH  Hypertension associated with diabetes ([place]) - Plan: CMP14+EGFR  Ingrown toenail of both feet  Diabetes technically remains uncontrolled but her sugar has come down substantially since her last visit.  She has an A1c of 7.4 today.  She is only been on the increased dose of Trulicity for 1 month so think it is pertinent to allow her the  full 3 months before making any further adjustments.  We did discuss potential increased to 4.5 mg of Trulicity each week.  I would hesitate to increase her insulin given morning blood sugars observed in the 80s.  We reviewed carb restriction  Continue statin  Check thyroid panel  Blood pressures well controlled.  Continue current regimen  Offered referral to podiatry but she declined this today.  Her daughter works in Radiographer, therapeutic and will continue working on these for her.  She will contact me should she change her mind  Orders Placed This Encounter  Procedures  . CMP14+EGFR  . Thyroid Panel With TSH  . Bayer DCA Hb A1c Waived   No orders of the defined types were placed in this encounter.    [name], DO [place] [phone]

## 2020-11-29 LAB — CMP14+EGFR
ALT: 14 IU/L (ref 0–32)
AST: 19 IU/L (ref 0–40)
Albumin/Globulin Ratio: 1.7 (ref 1.2–2.2)
Albumin: 4.7 g/dL (ref 3.8–4.8)
Alkaline Phosphatase: 56 IU/L (ref 44–121)
BUN/Creatinine Ratio: 13 (ref 12–28)
BUN: 11 mg/dL (ref 8–27)
Bilirubin Total: 0.5 mg/dL (ref 0.0–1.2)
CO2: 25 mmol/L (ref 20–29)
Calcium: 10.4 mg/dL — ABNORMAL HIGH (ref 8.7–10.3)
Chloride: 101 mmol/L (ref 96–106)
Creatinine, Ser: 0.83 mg/dL (ref 0.57–1.00)
GFR calc Af Amer: 88 mL/min/{1.73_m2} (ref >59)
GFR calc non Af Amer: 76 mL/min/{1.73_m2} (ref >59)
Globulin, Total: 2.7 g/dL (ref 1.5–4.5)
Glucose: 148 mg/dL — ABNORMAL HIGH (ref 65–99)
Potassium: 4.4 mmol/L (ref 3.5–5.2)
Sodium: 141 mmol/L (ref 134–144)
Total Protein: 7.4 g/dL (ref 6.0–8.5)

## 2020-11-29 LAB — THYROID PANEL WITH TSH
Free Thyroxine Index: 1.7 (ref 1.2–4.9)
T3 Uptake Ratio: 22 % — ABNORMAL LOW (ref 24–39)
T4, Total: 7.5 ug/dL (ref 4.5–12.0)
TSH: 5.38 u[IU]/mL — ABNORMAL HIGH (ref 0.450–4.500)

## 2020-12-03 ENCOUNTER — Other Ambulatory Visit: Payer: Self-pay | Admitting: *Deleted

## 2020-12-03 MED ORDER — LEVOTHYROXINE SODIUM 25 MCG PO TABS: 25.0000 ug | ORAL_TABLET | Freq: Every day | ORAL | 0 refills | Status: DC

## 2021-01-04 ENCOUNTER — Ambulatory Visit: Payer: Medicare Other | Admitting: Family Medicine

## 2021-01-14 ENCOUNTER — Ambulatory Visit (INDEPENDENT_AMBULATORY_CARE_PROVIDER_SITE_OTHER): Payer: Medicare Other | Admitting: Family Medicine

## 2021-01-14 ENCOUNTER — Encounter: Payer: Self-pay | Admitting: Family Medicine

## 2021-01-14 DIAGNOSIS — R002 Palpitations: Secondary | ICD-10-CM

## 2021-01-14 DIAGNOSIS — R112 Nausea with vomiting, unspecified: Secondary | ICD-10-CM

## 2021-01-14 MED ORDER — ONDANSETRON 8 MG PO TBDP: 8.0000 mg | ORAL_TABLET | Freq: Four times a day (QID) | ORAL | 1 refills | Status: DC | PRN

## 2021-01-14 NOTE — Progress Notes (Signed)
Subjective:    Patient ID: Casey Sanders, female    DOB: Feb 25, 1961, 60 y.o.   MRN: [id number]   HPI: Casey Sanders is a 60 y.o. female presenting for nausea for 1 week. Vomiting started the next day. If I move I get dizzy & sweating. No fever. Stomach won't settle down. BP and heart rate went up a couple of days ago. HEart beating hard. Rate is 82-108 on her apple watch. Can't move her arms. Can't eat anything. Drinking pedialyte. Denies diarrhea. No BM for a week. Abd pain intermittently & stomach growls and starts hurting & gets tight. Has no transportation to get to the doctor's. Declines 911 service since she can't take a bath.    Depression screen [facility] 2/9 11/28/2020 04/13/2020 01/27/2020 12/16/2019 01/11/2019  Decreased Interest 0 0 0 0 2  Down, Depressed, Hopeless 0 0 0 0 3  PHQ - 2 Score 0 0 0 0 5  Altered sleeping 0 - - - 1  Tired, decreased energy 0 - - - 3  Change in appetite 0 - - - 2  Feeling bad or failure about yourself  0 - - - 2  Trouble concentrating 0 - - - 2  Moving slowly or fidgety/restless 0 - - - 2  Suicidal thoughts 0 - - - 0  PHQ-9 Score 0 - - - 17  Difficult doing work/chores - - - - Somewhat difficult  Some recent data might be hidden     Relevant past medical, surgical, family and social history reviewed and updated as indicated.  Interim medical history since our last visit reviewed. Allergies and medications reviewed and updated.  ROS:  Review of Systems  Constitutional: Negative.   HENT: Negative.   Eyes: Negative for visual disturbance.  Respiratory: Negative for shortness of breath.   Cardiovascular: Positive for palpitations. Negative for chest pain.  Gastrointestinal: Positive for nausea and vomiting. Negative for abdominal pain and diarrhea.  Musculoskeletal: Negative for arthralgias.     Social History   Tobacco Use  Smoking Status Former Smoker  Smokeless Tobacco Never Used       Objective:     Wt Readings from Last 3 Encounters:   11/28/20 186 lb (84.4 kg)  07/18/20 192 lb 9.6 oz (87.4 kg)  04/13/20 196 lb (88.9 kg)     Exam deferred. Pt. Harboring due to COVID 19. Phone visit performed.   Assessment & Plan:   1. Palpitations   2. Non-intractable vomiting with nausea, unspecified vomiting type     Meds ordered this encounter  Medications  . ondansetron (ZOFRAN-ODT) 8 MG disintegrating tablet    Sig: Take 1 tablet (8 mg total) by mouth every 6 (six) hours as needed for nausea or vomiting.    Dispense:  20 tablet    Refill:  1    No orders of the defined types were placed in this encounter.     Diagnoses and all orders for this visit:  Palpitations  Non-intractable vomiting with nausea, unspecified vomiting type  Other orders -     ondansetron (ZOFRAN-ODT) 8 MG disintegrating tablet; Take 1 tablet (8 mg total) by mouth every 6 (six) hours as needed for nausea or vomiting.    Virtual Visit via telephone Note  I discussed the limitations, risks, security and privacy concerns of performing an evaluation and management service by telephone and the availability of in person appointments. The patient was identified with two identifiers. Pt.expressed understanding and agreed to proceed. Pt. Is  at home. Dr. Livia Snellen is in his office.  Follow Up Instructions:   I discussed the assessment and treatment plan with the patient. The patient was provided an opportunity to ask questions and all were answered. The patient agreed with the plan and demonstrated an understanding of the instructions.   The patient was advised to call back or seek an in-person evaluation if the symptoms worsen or if the condition fails to improve as anticipated.   Total minutes including chart review and phone contact time: 23   Follow up plan: No follow-ups on file.  Claretta Fraise, MD [place]

## 2021-01-15 ENCOUNTER — Encounter: Payer: Self-pay | Admitting: Family Medicine

## 2021-01-15 ENCOUNTER — Ambulatory Visit (INDEPENDENT_AMBULATORY_CARE_PROVIDER_SITE_OTHER): Payer: Medicare Other | Admitting: Family Medicine

## 2021-01-15 ENCOUNTER — Other Ambulatory Visit: Payer: Self-pay

## 2021-01-15 VITALS — BP 142/80 | HR 99 | Temp 97.5°F | Ht 61.0 in | Wt 169.8 lb

## 2021-01-15 DIAGNOSIS — K2900 Acute gastritis without bleeding: Secondary | ICD-10-CM

## 2021-01-15 DIAGNOSIS — K5903 Drug induced constipation: Secondary | ICD-10-CM | POA: Diagnosis not present

## 2021-01-15 DIAGNOSIS — E039 Hypothyroidism, unspecified: Secondary | ICD-10-CM | POA: Diagnosis not present

## 2021-01-15 DIAGNOSIS — R1013 Epigastric pain: Secondary | ICD-10-CM

## 2021-01-15 MED ORDER — PANTOPRAZOLE SODIUM 40 MG PO TBEC: 40.0000 mg | DELAYED_RELEASE_TABLET | Freq: Every day | ORAL | 0 refills | Status: DC

## 2021-01-15 NOTE — Progress Notes (Signed)
Subjective: CC: Nausea and vomiting PCP: [name], DO QMG:[id number] Casey Sanders is a 60 y.o. female presenting to clinic today for:  1.  Nausea and vomiting Patient reports that she is had about a week history of nausea with vomiting.  She feels like she has an upset stomach but denies overt abdominal pain.  No diarrhea but has constipation since starting Trulicity.  Not currently taking anything for constipation.  She is hydrating with Pedialyte.  No known sick exposures.  She contacted our office for a televisit yesterday and was prescribed Zofran.  She notes that the Zofran has helped with the nausea but she still has decreased appetite.  No hematochezia or melena.  She feels weak.   ROS: Per HPI  Allergies  Allergen Reactions  . Codeine Hives, Nausea And Vomiting and Other (See Comments)  . Jardiance [Empagliflozin]     Caused blisters  . Metformin And Related     nausea  . Niaspan [Niacin Er] Other (See Comments)    Increase blood glucose   Past Medical History:  Diagnosis Date  . Allergic rhinitis   . Anxiety and depression   . Asthma   . Benign essential HTN 01/04/2011  . Chest pain    a. Myoview 9/05: no ischemia, no scar, EF 70%  . Chest pain, unspecified 02/12/2011  . Cystitis 02/08/2015  . Diverticulosis   . DM2 (diabetes mellitus, type 2) ([place])   . Extremity pain 01/04/2013  . Fibromyalgia   . Frequent UTI   . GERD (gastroesophageal reflux disease)    Hiatal Hernia  . HLD (hyperlipidemia)   . HTN (hypertension)   . Left rotator cuff tear   . Low back pain 01/04/2013  . Lumbar disc disease   . Nephrolithiasis   . Obesity   . Palpitations    monitor 9/05: NSR, sinus tachy, PVCs  . Pre-operative cardiovascular examination 02/12/2011  . Rectal bleeding 08/22/2013  . Sepsis secondary to UTI ([place]) 08/09/2017    Current Outpatient Medications:  .  Accu-Chek Softclix Lancets lancets, Please use to test blood sugar 3 times daily as directed. DX: E11.9, Disp:  100 each, Rfl: 12 .  albuterol (VENTOLIN HFA) 108 (90 Base) MCG/ACT inhaler, Inhale 1-2 puffs into the lungs every 6 (six) hours as needed for wheezing or shortness of breath., Disp: 8 g, Rfl: 0 .  amitriptyline (ELAVIL) 25 MG tablet, Take one or two tablets at bedtime. (Patient taking differently: Take 25-50 mg by mouth at bedtime.), Disp: 60 tablet, Rfl: 0 .  aspirin 81 MG EC tablet, Take 81 mg by mouth daily., Disp: , Rfl:  .  atorvastatin (LIPITOR) 80 MG tablet, TAKE 1 TABLET BY MOUTH ONCE DAILY AT 6 PM, Disp: 90 tablet, Rfl: 0 .  Blood Glucose Monitoring Suppl (ACCU-CHEK GUIDE ME) w/Device KIT, Please use to test blood sugar 3 times daily as directed. DX: E11.9, Disp: 1 kit, Rfl: 0 .  clonazePAM (KLONOPIN) 2 MG tablet, Take 2 mg by mouth 2 (two) times daily., Disp: , Rfl:  .  cyclobenzaprine (FLEXERIL) 10 MG tablet, Take 10 mg by mouth 3 (three) times daily as needed for muscle spasms. , Disp: , Rfl: 1 .  diclofenac sodium (VOLTAREN) 1 % GEL, Apply 2 g topically 4 (four) times daily., Disp: 100 g, Rfl: 1 .  Dulaglutide (TRULICITY) 3 KD/3.2IZ SOPN, Inject 3 mg into the skin once a week., Disp: 6.5 mL, Rfl: 0 .  fish oil-omega-3 fatty acids 1000 MG capsule, Take 2  g by mouth daily., Disp: , Rfl:  .  gabapentin (NEURONTIN) 400 MG capsule, Take 1 capsule by mouth 4 times daily, Disp: 120 capsule, Rfl: 0 .  glucose blood (ACCU-CHEK GUIDE) test strip, Please use to test blood sugar 3 times daily as directed. DX: E11.9, Disp: 100 each, Rfl: 12 .  Insulin Pen Needle (NOVOFINE) 32G X 6 MM MISC, UAD to inject Victoza and Lantus daily. (Patient taking differently: UAD Lantus daily.), Disp: 200 each, Rfl: 1 .  LANTUS SOLOSTAR 100 UNIT/ML Solostar Pen, INJECT 48 UNITS SUBCUTANEOUSLY ONCE DAILY AT 10 PM (Patient taking differently: 30 Units.), Disp: 15 mL, Rfl: 0 .  levothyroxine (SYNTHROID) 25 MCG tablet, Take 1 tablet (25 mcg total) by mouth daily before breakfast., Disp: 90 tablet, Rfl: 0 .  lisinopril  (ZESTRIL) 10 MG tablet, Take 1 tablet by mouth once daily, Disp: 90 tablet, Rfl: 0 .  ondansetron (ZOFRAN-ODT) 8 MG disintegrating tablet, Take 1 tablet (8 mg total) by mouth every 6 (six) hours as needed for nausea or vomiting., Disp: 20 tablet, Rfl: 1 .  sertraline (ZOLOFT) 100 MG tablet, Take 100 mg by mouth 2 (two) times daily. , Disp: , Rfl:  Social History   Socioeconomic History  . Marital status: Married    Spouse name: Not on file  . Number of children: 2  . Years of education: Not on file  . Highest education level: Not on file  Occupational History  . Occupation: disabled  Tobacco Use  . Smoking status: Former Research scientist (life sciences)  . Smokeless tobacco: Never Used  Vaping Use  . Vaping Use: Never used  Substance and Sexual Activity  . Alcohol use: No  . Drug use: No  . Sexual activity: Not on file  Other Topics Concern  . Not on file  Social History Narrative  . Not on file   Social Determinants of Health   Financial Resource Strain: Not on file  Food Insecurity: Not on file  Transportation Needs: Not on file  Physical Activity: Not on file  Stress: Not on file  Social Connections: Not on file  Intimate Partner Violence: Not on file   Family History  Problem Relation Age of Onset  . Coronary artery disease Mother   . Coronary artery disease Father     Objective: Office vital signs reviewed. BP (!) 142/80   Pulse 99   Temp (!) 97.5 F (36.4 C) (Temporal)   Ht $R'5\' 1"'Iy$  (1.549 m)   Wt 169 lb 12.8 oz (77 kg)   SpO2 99%   BMI 32.08 kg/m   Physical Examination:  General: Awake, alert, appears ill, No acute distress HEENT: Normal; sclera white; no exophthalmos GI: Mild epigastric tenderness to palpation present.  No palpable masses or abnormalities.  Abdomen is soft.  No guarding or rebound  Assessment/ Plan: 60 y.o. female   Other acute gastritis without hemorrhage - Plan: pantoprazole (PROTONIX) 40 MG tablet, CBC, CANCELED: Basic Metabolic Panel  Epigastric pain -  Plan: pantoprazole (PROTONIX) 40 MG tablet, CBC, Lipase, CMP14+EGFR, CANCELED: Basic Metabolic Panel  Drug-induced constipation  Hypothyroidism, unspecified type - Plan: TSH, T4, Free  I suspect that this is a gastritis induced from the Trulicity but I have considered perhaps pancreatitis that she does have epigastric tenderness.  Check lipase, CMP, CBC. I am starting her on a PPI daily.  I will reassess her in 1 week, sooner if concerns arise  Adequate hydration, increase p.o. intake will hopefully improve the constipation.  Otherwise, we could  consider adding a daily medication.  Recheck free T4, TSH given recent initiation of Synthroid 25 mcg.  No orders of the defined types were placed in this encounter.  No orders of the defined types were placed in this encounter.    [name], DO [place] [phone]

## 2021-01-15 NOTE — Patient Instructions (Addendum)
Start Pantoprazole for your stomach.  Take 1 tablet daily. We will see each other in 7-10 days for recheck Seek immediate medical care if your pain gets worse, you cannot keep fluids down or you become extremely weak.  Ok to use nausea medication if needed. HYDRATE.  Try and eat bland foods/ soups  You had labs performed today.    You will be contacted with the results of the labs once they are available, usually in the next 3 business days for routine lab work.  If you have an active my chart account, they will be released to your MyChart.  If you prefer to have these labs released to you via telephone, please let us know.   Food Choices for Gastroesophageal Reflux Disease, Adult When you have gastroesophageal reflux disease (GERD), the foods you eat and your eating habits are very important. Choosing the right foods can help ease your discomfort. Think about working with a food expert (dietitian) to help you make good choices. What are tips for following this plan? Reading food labels  Look for foods that are low in saturated fat. Foods that may help with your symptoms include: ? Foods that have less than 5% of daily value (DV) of fat. ? Foods that have 0 grams of trans fat. Cooking  Do not fry your food.  Cook your food by baking, steaming, grilling, or broiling. These are all methods that do not need a lot of fat for cooking.  To add flavor, try to use herbs that are low in spice and acidity. Meal planning  Choose healthy foods that are low in fat, such as: ? Fruits and vegetables. ? Whole grains. ? Low-fat dairy products. ? Lean meats, fish, and poultry.  Eat small meals often instead of eating 3 large meals each day. Eat your meals slowly in a place where you are relaxed. Avoid bending over or lying down until 2-3 hours after eating.  Limit high-fat foods such as fatty meats or fried foods.  Limit your intake of fatty foods, such as oils, butter, and shortening.  Avoid  the following as told by your doctor: ? Foods that cause symptoms. These may be different for different people. Keep a food diary to keep track of foods that cause symptoms. ? Alcohol. ? Drinking a lot of liquid with meals. ? Eating meals during the 2-3 hours before bed.   Lifestyle  Stay at a healthy weight. Ask your doctor what weight is healthy for you. If you need to lose weight, work with your doctor to do so safely.  Exercise for at least 30 minutes on 5 or more days each week, or as told by your doctor.  Wear loose-fitting clothes.  Do not smoke or use any products that contain nicotine or tobacco. If you need help quitting, ask your doctor.  Sleep with the head of your bed higher than your feet. Use a wedge under the mattress or blocks under the bed frame to raise the head of the bed.  Chew sugar-free gum after meals. What foods should eat? Eat a healthy, well-balanced diet of fruits, vegetables, whole grains, low-fat dairy products, lean meats, fish, and poultry. Each person is different. Foods that may cause symptoms in one person may not cause any symptoms in another person. Work with your doctor to find foods that are safe for you. The items listed above may not be a complete list of what you can eat and drink. Contact a food expert for  more options.   What foods should I avoid? Limiting some of these foods may help in managing the symptoms of GERD. Everyone is different. Talk with a food expert or your doctor to help you find the exact foods to avoid, if any. Fruits Any fruits prepared with added fat. Any fruits that cause symptoms. For some people, this may include citrus fruits, such as oranges, grapefruit, pineapple, and lemons. Vegetables Deep-fried vegetables. [place] fries. Any vegetables prepared with added fat. Any vegetables that cause symptoms. For some people, this may include tomatoes and tomato products, chili peppers, onions and garlic, and  horseradish. Grains Pastries or quick breads with added fat. Meats and other proteins High-fat meats, such as fatty beef or pork, hot dogs, ribs, ham, sausage, salami, and bacon. Fried meat or protein, including fried fish and fried chicken. Nuts and nut butters, in large amounts. Dairy Whole milk and chocolate milk. Sour cream. Cream. Ice cream. Cream cheese. Milkshakes. Fats and oils Butter. Margarine. Shortening. Ghee. Beverages Coffee and tea, with or without caffeine. Carbonated beverages. Sodas. Energy drinks. Fruit juice made with acidic fruits, such as orange or grapefruit. Tomato juice. Alcoholic drinks. Sweets and desserts Chocolate and cocoa. Donuts. Seasonings and condiments Pepper. Peppermint and spearmint. Added salt. Any condiments, herbs, or seasonings that cause symptoms. For some people, this may include curry, hot sauce, or vinegar-based salad dressings. The items listed above may not be a complete list of what you should not eat and drink. Contact a food expert for more options. Questions to ask your doctor Diet and lifestyle changes are often the first steps that are taken to manage symptoms of GERD. If diet and lifestyle changes do not help, talk with your doctor about taking medicines. Where to find more information  International Foundation for Gastrointestinal Disorders: aboutgerd.org Summary  When you have GERD, food and lifestyle choices are very important in easing your symptoms.  Eat small meals often instead of 3 large meals a day. Eat your meals slowly and in a place where you are relaxed.  Avoid bending over or lying down until 2-3 hours after eating.  Limit high-fat foods such as fatty meats or fried foods. This information is not intended to replace advice given to you by your health care provider. Make sure you discuss any questions you have with your health care provider. Document Revised: 04/16/2020 Document Reviewed: 04/16/2020 Elsevier Patient  Education  [place].

## 2021-01-16 LAB — CBC
Hematocrit: 44.8 % (ref 34.0–46.6)
Hemoglobin: 15.6 g/dL (ref 11.1–15.9)
MCH: 27.6 pg (ref 26.6–33.0)
MCHC: 34.8 g/dL (ref 31.5–35.7)
MCV: 79 fL (ref 79–97)
Platelets: 249 10*3/uL (ref 150–450)
RBC: 5.66 x10E6/uL — ABNORMAL HIGH (ref 3.77–5.28)
RDW: 13.8 % (ref 11.7–15.4)
WBC: 8.8 10*3/uL (ref 3.4–10.8)

## 2021-01-16 LAB — CMP14+EGFR
ALT: 10 IU/L (ref 0–32)
AST: 17 IU/L (ref 0–40)
Albumin/Globulin Ratio: 2 (ref 1.2–2.2)
Albumin: 5.1 g/dL — ABNORMAL HIGH (ref 3.8–4.8)
Alkaline Phosphatase: 57 IU/L (ref 44–121)
BUN/Creatinine Ratio: 14 (ref 12–28)
BUN: 12 mg/dL (ref 8–27)
Bilirubin Total: 0.7 mg/dL (ref 0.0–1.2)
CO2: 19 mmol/L — ABNORMAL LOW (ref 20–29)
Calcium: 10.5 mg/dL — ABNORMAL HIGH (ref 8.7–10.3)
Chloride: 99 mmol/L (ref 96–106)
Creatinine, Ser: 0.83 mg/dL (ref 0.57–1.00)
Globulin, Total: 2.6 g/dL (ref 1.5–4.5)
Glucose: 161 mg/dL — ABNORMAL HIGH (ref 65–99)
Potassium: 3.5 mmol/L (ref 3.5–5.2)
Sodium: 143 mmol/L (ref 134–144)
Total Protein: 7.7 g/dL (ref 6.0–8.5)
eGFR: 80 mL/min/{1.73_m2} (ref >59)

## 2021-01-16 LAB — T4, FREE: Free T4: 1.39 ng/dL (ref 0.82–1.77)

## 2021-01-16 LAB — TSH: TSH: 4.46 u[IU]/mL (ref 0.450–4.500)

## 2021-01-16 LAB — LIPASE: Lipase: 48 U/L (ref 14–72)

## 2021-01-21 ENCOUNTER — Other Ambulatory Visit: Payer: Self-pay | Admitting: Family Medicine

## 2021-01-21 DIAGNOSIS — E785 Hyperlipidemia, unspecified: Secondary | ICD-10-CM

## 2021-01-21 DIAGNOSIS — E1169 Type 2 diabetes mellitus with other specified complication: Secondary | ICD-10-CM

## 2021-01-21 DIAGNOSIS — I10 Essential (primary) hypertension: Secondary | ICD-10-CM

## 2021-01-21 NOTE — Progress Notes (Signed)
Subjective: CC: epigastric pain recheck PCP: [name], DO ZSM:OLMBEML Casey Sanders is a 60 y.o. female presenting to clinic today for:  1.  Epigastric pain, nausea Patient reports improvement in her nausea.  She occasionally feels queasy but overall has been tolerating full or small meals per day and has had no vomiting since our last visit.  She is not required the Zofran at all.  She has adjusted when she takes her pantoprazole and is typically taking it around 11 AM.  She admits that in the evening time she does start having some GI symptoms which include gnawing and gurgling of her stomach.  This does interfere with her sleep.  She has not had any vomiting as a result however.  She has not yet reached out to her gastroenterologist but believes that she was seen in [facility] for her last colonoscopy several years ago.  She does not report any GI bleeding.  Her weakness is still present but getting better.   ROS: Per HPI  Allergies  Allergen Reactions  . Codeine Hives, Nausea And Vomiting and Other (See Comments)  . Jardiance [Empagliflozin]     Caused blisters  . Metformin And Related     nausea  . Niaspan [Niacin Er] Other (See Comments)    Increase blood glucose   Past Medical History:  Diagnosis Date  . Allergic rhinitis   . Anxiety and depression   . Asthma   . Benign essential HTN 01/04/2011  . Chest pain    a. Myoview 9/05: no ischemia, no scar, EF 70%  . Chest pain, unspecified 02/12/2011  . Cystitis 02/08/2015  . Diverticulosis   . DM2 (diabetes mellitus, type 2) ([place])   . Extremity pain 01/04/2013  . Fibromyalgia   . Frequent UTI   . GERD (gastroesophageal reflux disease)    Hiatal Hernia  . HLD (hyperlipidemia)   . HTN (hypertension)   . Left rotator cuff tear   . Low back pain 01/04/2013  . Lumbar disc disease   . Nephrolithiasis   . Obesity   . Palpitations    monitor 9/05: NSR, sinus tachy, PVCs  . Pre-operative cardiovascular examination 02/12/2011   . Rectal bleeding 08/22/2013  . Sepsis secondary to UTI ([place]) 08/09/2017    Current Outpatient Medications:  .  Accu-Chek Softclix Lancets lancets, Please use to test blood sugar 3 times daily as directed. DX: E11.9, Disp: 100 each, Rfl: 12 .  albuterol (VENTOLIN HFA) 108 (90 Base) MCG/ACT inhaler, Inhale 1-2 puffs into the lungs every 6 (six) hours as needed for wheezing or shortness of breath., Disp: 8 g, Rfl: 0 .  amitriptyline (ELAVIL) 25 MG tablet, Take one or two tablets at bedtime. (Patient taking differently: Take 25-50 mg by mouth at bedtime.), Disp: 60 tablet, Rfl: 0 .  aspirin 81 MG EC tablet, Take 81 mg by mouth daily., Disp: , Rfl:  .  atorvastatin (LIPITOR) 80 MG tablet, TAKE 1 TABLET BY MOUTH ONCE DAILY AT  6  PM, Disp: 90 tablet, Rfl: 0 .  Blood Glucose Monitoring Suppl (ACCU-CHEK GUIDE ME) w/Device KIT, Please use to test blood sugar 3 times daily as directed. DX: E11.9, Disp: 1 kit, Rfl: 0 .  clonazePAM (KLONOPIN) 2 MG tablet, Take 2 mg by mouth 2 (two) times daily., Disp: , Rfl:  .  cyclobenzaprine (FLEXERIL) 10 MG tablet, Take 10 mg by mouth 3 (three) times daily as needed for muscle spasms. , Disp: , Rfl: 1 .  diclofenac sodium (VOLTAREN)  1 % GEL, Apply 2 g topically 4 (four) times daily., Disp: 100 g, Rfl: 1 .  fish oil-omega-3 fatty acids 1000 MG capsule, Take 2 g by mouth daily., Disp: , Rfl:  .  gabapentin (NEURONTIN) 400 MG capsule, Take 1 capsule by mouth 4 times daily, Disp: 120 capsule, Rfl: 0 .  glucose blood (ACCU-CHEK GUIDE) test strip, Please use to test blood sugar 3 times daily as directed. DX: E11.9, Disp: 100 each, Rfl: 12 .  Insulin Pen Needle (NOVOFINE) 32G X 6 MM MISC, UAD to inject Victoza and Lantus daily. (Patient taking differently: UAD Lantus daily.), Disp: 200 each, Rfl: 1 .  LANTUS SOLOSTAR 100 UNIT/ML Solostar Pen, INJECT 48 UNITS SUBCUTANEOUSLY ONCE DAILY AT 10 PM (Patient taking differently: 30 Units.), Disp: 15 mL, Rfl: 0 .  levothyroxine  (SYNTHROID) 25 MCG tablet, Take 1 tablet (25 mcg total) by mouth daily before breakfast., Disp: 90 tablet, Rfl: 0 .  lisinopril (ZESTRIL) 10 MG tablet, Take 1 tablet by mouth once daily, Disp: 90 tablet, Rfl: 0 .  ondansetron (ZOFRAN-ODT) 8 MG disintegrating tablet, Take 1 tablet (8 mg total) by mouth every 6 (six) hours as needed for nausea or vomiting., Disp: 20 tablet, Rfl: 1 .  pantoprazole (PROTONIX) 40 MG tablet, Take 1 tablet (40 mg total) by mouth daily., Disp: 30 tablet, Rfl: 0 .  sertraline (ZOLOFT) 100 MG tablet, Take 100 mg by mouth 2 (two) times daily. , Disp: , Rfl:  .  TRULICITY 3 GN/5.6OZ SOPN, INJECT 1 DOSE SUBCUTANEOUSLY ONCE A WEEK, Disp: 12 mL, Rfl: 0 Social History   Socioeconomic History  . Marital status: Married    Spouse name: Not on file  . Number of children: 2  . Years of education: Not on file  . Highest education level: Not on file  Occupational History  . Occupation: disabled  Tobacco Use  . Smoking status: Former Research scientist (life sciences)  . Smokeless tobacco: Never Used  Vaping Use  . Vaping Use: Never used  Substance and Sexual Activity  . Alcohol use: No  . Drug use: No  . Sexual activity: Not on file  Other Topics Concern  . Not on file  Social History Narrative  . Not on file   Social Determinants of Health   Financial Resource Strain: Not on file  Food Insecurity: Not on file  Transportation Needs: Not on file  Physical Activity: Not on file  Stress: Not on file  Social Connections: Not on file  Intimate Partner Violence: Not on file   Family History  Problem Relation Age of Onset  . Coronary artery disease Mother   . Coronary artery disease Father     Objective: Office vital signs reviewed. BP 134/81   Pulse 86   Temp (!) 97.5 F (36.4 C)   Resp 20   Ht _0  (1.549 m)   Wt 168 lb (76.2 kg)   SpO2 98%   BMI 31.74 kg/m   Physical Examination:  General: Awake, alert, chronically ill-appearing, No acute distress HEENT: Normal; sclera  white.  Moist mucous membranes MSK: Ambulating with use of walker  Assessment/ Plan: 60 y.o. female   Epigastric pain - Plan: pantoprazole (PROTONIX) 40 MG tablet, Ambulatory referral to Gastroenterology  Other acute gastritis without hemorrhage - Plan: pantoprazole (PROTONIX) 40 MG tablet, Ambulatory referral to Gastroenterology  Assistance needed with transportation - Plan: AMB Referral to [facility]  It sounds like her symptoms are getting better with Protonix but she is not  quite getting enough coverage.  Again, I do have concerns about potential ulcer versus gastritis in this patient and therefore I am going to advance her to Protonix 40 mg twice daily until she is assessed by her gastroenterologist.  We discussed that she will likely require EGD.  Alternative diagnoses discussed with the patient includes gastroparesis which could be a combination of her diabetes and even the medication that she is being treated with, Trulicity.  We discussed that there is a potential for need to discontinue the Trulicity and return to Lantus alone to control her blood sugars.  She has had excellent weight loss with the Trulicity but I do worry that it may be impacting her GI issues.  We reviewed her labs today which showed improvement in her thyroid levels, no evidence of pancreatitis.  We discussed that she has a slight elevation in her calcium level and I have recommended that for now she hold off on any calcium supplementation.  If calcium levels are persistently elevated, we may consider adding PTH and calcium to her next laboratory draw.  She is on no other medications that I can tell with induced hypercalcemia, including diuretic.  Additionally, she expressed that she has difficulty with transportation to her appointments as she is disabled and depends upon her children who work during the daytime hours.  I have placed a referral to CCM in hopes that they might be able to assist arranging  [place] transportation services.  We discussed potential for [facility] if they are unable to secure appropriate transportation. Not sure if she is an appropriate patient for their services, however.  Orders Placed This Encounter  Procedures  . Ambulatory referral to Gastroenterology    Referral Priority:   Routine    Referral Type:   Consultation    Referral Reason:   Specialty Services Required    Number of Visits Requested:   1  . AMB Referral to [facility] Coordinaton    Referral Priority:   Routine    Referral Type:   Consultation    Referral Reason:   Care Coordination    Number of Visits Requested:   1   Meds ordered this encounter  Medications  . pantoprazole (PROTONIX) 40 MG tablet    Sig: Take 1 tablet (40 mg total) by mouth 2 (two) times daily before a meal.    Dispense:  60 tablet    Refill:  2     Mason Burleigh Windell Moulding, DO [place] [phone]

## 2021-01-22 ENCOUNTER — Ambulatory Visit (INDEPENDENT_AMBULATORY_CARE_PROVIDER_SITE_OTHER): Payer: Medicare Other | Admitting: Family Medicine

## 2021-01-22 ENCOUNTER — Encounter: Payer: Self-pay | Admitting: Family Medicine

## 2021-01-22 ENCOUNTER — Other Ambulatory Visit: Payer: Self-pay

## 2021-01-22 VITALS — BP 134/81 | HR 86 | Temp 97.5°F | Resp 20 | Ht 61.0 in | Wt 168.0 lb

## 2021-01-22 DIAGNOSIS — Z748 Other problems related to care provider dependency: Secondary | ICD-10-CM

## 2021-01-22 DIAGNOSIS — R1013 Epigastric pain: Secondary | ICD-10-CM

## 2021-01-22 DIAGNOSIS — K2900 Acute gastritis without bleeding: Secondary | ICD-10-CM

## 2021-01-22 MED ORDER — PANTOPRAZOLE SODIUM 40 MG PO TBEC
40.0000 mg | DELAYED_RELEASE_TABLET | Freq: Two times a day (BID) | ORAL | 2 refills | Status: DC
Start: 2021-01-22 — End: 2021-06-27

## 2021-01-23 ENCOUNTER — Telehealth: Payer: Self-pay | Admitting: *Deleted

## 2021-01-23 NOTE — Chronic Care Management (AMB) (Signed)
  Chronic Care Management   Note  01/23/2021 Name: Casey Sanders MRN: [id number] DOB: 1961-03-17  Casey Sanders is a 60 y.o. year old female who is a primary care patient of [name], DO. I reached out to [employer] by phone today in response to a referral sent by Ms. Johnney Killian PCP, Dr. Lajuana Ripple.     [name] was given information about Chronic Care Management services today including:  1. CCM service includes personalized support from designated clinical staff supervised by her physician, including individualized plan of care and coordination with other care providers 2. 24/7 contact phone numbers for assistance for urgent and routine care needs. 3. Service will only be billed when office clinical staff spend 20 minutes or more in a month to coordinate care. 4. Only one practitioner may furnish and bill the service in a calendar month. 5. The patient may stop CCM services at any time (effective at the end of the month) by phone call to the office staff. 6. The patient will be responsible for cost sharing (co-pay) of up to 20% of the service fee (after annual deductible is met).  Patient agreed to services and verbal consent obtained.   Follow up plan: Telephone appointment with care management team member scheduled for:02/07/2021  [place] [facility]

## 2021-01-24 ENCOUNTER — Telehealth: Payer: Self-pay | Admitting: Family Medicine

## 2021-01-24 NOTE — Telephone Encounter (Signed)
Yes I placed a CCM referral.  Cyril Mourning, can you check on this?

## 2021-01-24 NOTE — Telephone Encounter (Signed)
   Telephone encounter was:  Unsuccessful.  01/24/2021 Name: Deondrea Aguado MRN: [id number] DOB: 1961/02/21  Unsuccessful outbound call made today to assist with:  Transportation Needs   Outreach Attempt:  1st Attempt  A HIPAA compliant voice message was left requesting a return call.  Instructed patient to call back at [phone].  [place], Care Management Phone: [phone] Email: [email or web address]

## 2021-01-24 NOTE — Telephone Encounter (Signed)
   Telephone encounter was:  Successful.  01/24/2021 Name: [name] MRN: [id number] DOB: [birth year]  Casey Sanders is a 60 y.o. year old female who is a primary care patient of [name], DO . The community resource team was consulted for assistance with Transportation Needs   Care guide performed the following interventions: Patient provided with information about care guide support team and interviewed to confirm resource needs Discussed resources to assist with transportation. We discussed that she can call her insurance for a ride to her doctors offices that she doesn't have a ride too. [phone]. I also set up transportation with [facility] this time because she isn't feeling well and really needs to get to this doctors office.  Placed referral to [facility] ([place]  via email. .  Follow Up Plan:  Client will use transportation with her insurance company moving forward.  and No further follow up planned at this time. The patient has been provided with needed resources.  [place], Care Management Phone: [phone] Email: [email or web address]

## 2021-01-25 ENCOUNTER — Telehealth: Payer: Self-pay | Admitting: Family Medicine

## 2021-01-25 NOTE — Telephone Encounter (Signed)
   Casey Sanders DOB: 30-Jan-1961 MRN: [id number]   RIDER WAIVER AND RELEASE OF LIABILITY  For purposes of improving physical access to our facilities, [facility] is pleased to partner with third parties to provide [facility] patients or other authorized individuals the option of convenient, on-demand ground transportation services (the [place]") through use of the technology service that enables users to request on-demand ground transportation from independent third-party providers.  By opting to use and accept these [employer], I, the undersigned, hereby agree on behalf of myself, and on behalf of any minor child using the [employer] for whom I am the parent or legal guardian, as follows:  1. Government social research officer provided to me are provided by independent third-party transportation providers who are not [employer] or employees and who are unaffiliated with [employer]. 2. [facility] is neither a transportation carrier nor a common or public carrier. 3. [facility] has no control over the quality or safety of the transportation that occurs as a result of the [employer]. 4. [facility] cannot guarantee that any third-party transportation provider will complete any arranged transportation service. 5. [facility] makes no representation, warranty, or guarantee regarding the reliability, timeliness, quality, safety, suitability, or availability of any of the Transport Services or that they will be error free. 6. I fully understand that traveling by vehicle involves risks and dangers of serious bodily injury, including permanent disability, paralysis, and death. I agree, on behalf of myself and on behalf of any minor child using the Transport Services for whom I am the parent or legal guardian, that the entire risk arising out of my use of the [employer] remains solely with me, to the maximum extent permitted under applicable law. 7. The [employer] are provided "as is" and "as available." [facility] disclaims all representations and warranties, express, implied or statutory, not expressly set out in these terms, including the implied warranties of merchantability and fitness for a particular purpose. 8. I hereby waive and release [facility], its agents, employees, officers, directors, representatives, insurers, attorneys, assigns, successors, subsidiaries, and affiliates from any and all past, present, or future claims, demands, liabilities, actions, causes of action, or suits of any kind directly or indirectly arising from acceptance and use of the [employer]. 9. I further waive and release [facility] and its affiliates from all present and future liability and responsibility for any injury or death to persons or damages to property caused by or related to the use of the [employer]. 10. I have read this Waiver and Release of Liability, and I understand the terms used in it and their legal significance. This Waiver is freely and voluntarily given with the understanding that my right (as well as the right of any minor child for whom I am the parent or legal guardian using the [employer]) to legal recourse against [place] in connection with the [employer] is knowingly surrendered in return for use of these services.   I attest that I read the consent document to [name], gave Ms. Denes the opportunity to ask questions and answered the questions asked (if any). I affirm that [place] Streat then provided consent for she's participation in this program.     Declined Waiver will have her daughter call us back

## 2021-01-28 ENCOUNTER — Telehealth: Payer: Self-pay | Admitting: Family Medicine

## 2021-01-28 NOTE — Telephone Encounter (Signed)
   Telephone encounter was:  Successful.  01/28/2021 Name: [name] MRN: [id number] DOB: November 20, 1960  Casey Sanders is a 60 y.o. year old female who is a primary care patient of [name], DO . The community resource team was consulted for assistance with Transportation Needs   Care guide performed the following interventions: Transportation emailed me on Friday stating that the pt didn't understand what they were doing and that she would have to fill out a form to ride with them and canceled the service. I spoke to pt this morning and she stated that with the talk of what they do she felt "scared to death" to get into a car with our transporatation. She said that her son in law will take her to the appointment. I explained that she can use her insurance moving forward if she needs transportation. .Please do not offer [facility] transportation to this patient, she does not want to sign the waivers and not comfortable getting into a strangers car.   Follow Up Plan:  No further follow up planned at this time. The patient has been provided with needed resources.  [place], Care Management Phone: [phone] Email: [email or web address]

## 2021-01-29 NOTE — Telephone Encounter (Signed)
Patient was outreached by [facility] on 01/24/21 and provided information on transportation services.

## 2021-02-07 ENCOUNTER — Ambulatory Visit (INDEPENDENT_AMBULATORY_CARE_PROVIDER_SITE_OTHER): Payer: Medicare Other | Admitting: *Deleted

## 2021-02-07 DIAGNOSIS — K2900 Acute gastritis without bleeding: Secondary | ICD-10-CM

## 2021-02-07 DIAGNOSIS — I152 Hypertension secondary to endocrine disorders: Secondary | ICD-10-CM

## 2021-02-07 DIAGNOSIS — E1159 Type 2 diabetes mellitus with other circulatory complications: Secondary | ICD-10-CM

## 2021-02-07 DIAGNOSIS — E1165 Type 2 diabetes mellitus with hyperglycemia: Secondary | ICD-10-CM

## 2021-02-07 DIAGNOSIS — R112 Nausea with vomiting, unspecified: Secondary | ICD-10-CM

## 2021-02-08 ENCOUNTER — Other Ambulatory Visit: Payer: Self-pay | Admitting: Gastroenterology

## 2021-02-08 ENCOUNTER — Other Ambulatory Visit (HOSPITAL_COMMUNITY): Payer: Self-pay | Admitting: Gastroenterology

## 2021-02-08 DIAGNOSIS — R112 Nausea with vomiting, unspecified: Secondary | ICD-10-CM

## 2021-02-08 DIAGNOSIS — R634 Abnormal weight loss: Secondary | ICD-10-CM

## 2021-02-08 DIAGNOSIS — R6881 Early satiety: Secondary | ICD-10-CM | POA: Diagnosis not present

## 2021-02-08 DIAGNOSIS — R1310 Dysphagia, unspecified: Secondary | ICD-10-CM | POA: Diagnosis not present

## 2021-02-08 DIAGNOSIS — R1013 Epigastric pain: Secondary | ICD-10-CM | POA: Diagnosis not present

## 2021-02-08 DIAGNOSIS — Z1211 Encounter for screening for malignant neoplasm of colon: Secondary | ICD-10-CM | POA: Diagnosis not present

## 2021-02-11 ENCOUNTER — Ambulatory Visit: Payer: Medicare Other | Admitting: *Deleted

## 2021-02-11 DIAGNOSIS — I152 Hypertension secondary to endocrine disorders: Secondary | ICD-10-CM | POA: Diagnosis not present

## 2021-02-11 DIAGNOSIS — E1159 Type 2 diabetes mellitus with other circulatory complications: Secondary | ICD-10-CM | POA: Diagnosis not present

## 2021-02-11 DIAGNOSIS — E1165 Type 2 diabetes mellitus with hyperglycemia: Secondary | ICD-10-CM

## 2021-02-15 ENCOUNTER — Encounter (HOSPITAL_COMMUNITY): Payer: Self-pay

## 2021-02-15 ENCOUNTER — Other Ambulatory Visit: Payer: Self-pay

## 2021-02-15 ENCOUNTER — Ambulatory Visit (HOSPITAL_COMMUNITY)
Admission: RE | Admit: 2021-02-15 | Discharge: 2021-02-15 | Disposition: A | Discharge status: Home / Self Care | Payer: Medicare Other | Source: Ambulatory Visit | Attending: Gastroenterology | Admitting: Gastroenterology

## 2021-02-15 DIAGNOSIS — R6881 Early satiety: Secondary | ICD-10-CM

## 2021-02-15 DIAGNOSIS — R634 Abnormal weight loss: Secondary | ICD-10-CM

## 2021-02-15 DIAGNOSIS — R112 Nausea with vomiting, unspecified: Secondary | ICD-10-CM

## 2021-02-15 DIAGNOSIS — R1110 Vomiting, unspecified: Secondary | ICD-10-CM | POA: Diagnosis not present

## 2021-02-15 HISTORY — DX: Malignant (primary) neoplasm, unspecified: C80.1

## 2021-02-15 IMAGING — CT CT ABD-PELV W/ CM
2 of 5 series · 16 of 46 positions shown, 18 images · IV contrast (OMNIPAQUE)
Comparison: [DATE]

CLINICAL DATA: Epigastric pain with nausea vomiting and weight
loss.

EXAM:
CT ABDOMEN AND PELVIS WITH CONTRAST
TECHNIQUE: Multidetector CT imaging of the abdomen and pelvis was performed
using the standard protocol following bolus administration of
intravenous contrast.
CONTRAST:  100mL OMNIPAQUE IOHEXOL 300 MG/ML  SOLN

[Series 2: axial st · axial · 0.74mm/px · z∈[-460,-70]mm · 13 of 90 slices shown, 15 images]
[im 6/90  soft-tissue]
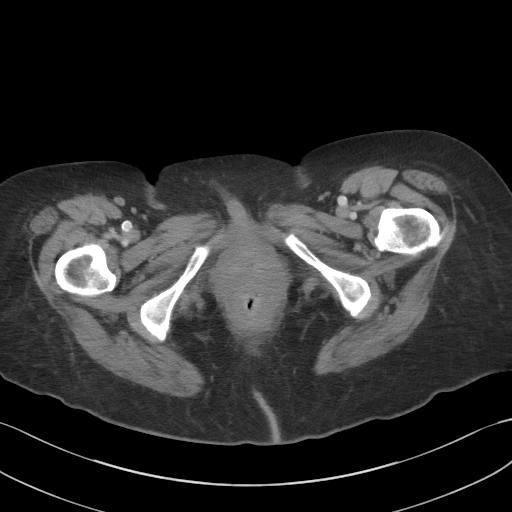
[im 6/90  bone]
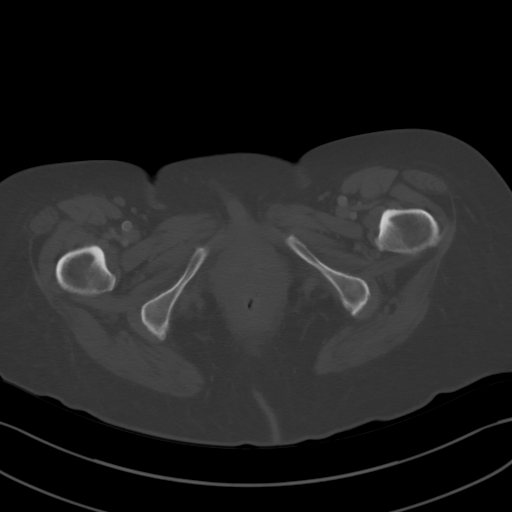
[im 12/90  soft-tissue]
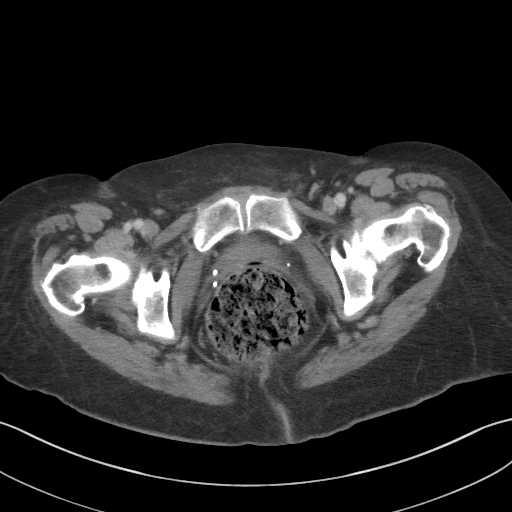
[im 18/90  soft-tissue]
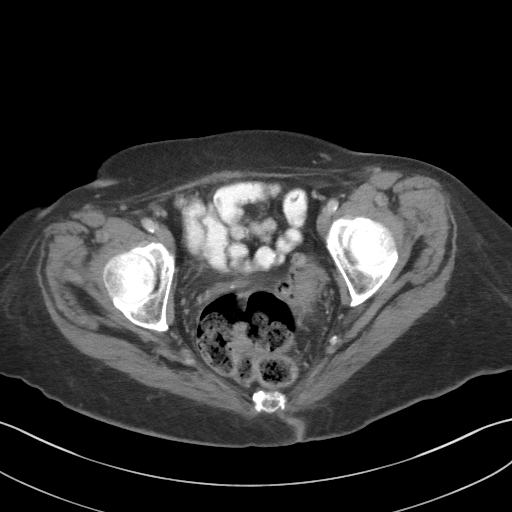
[im 24/90  soft-tissue]
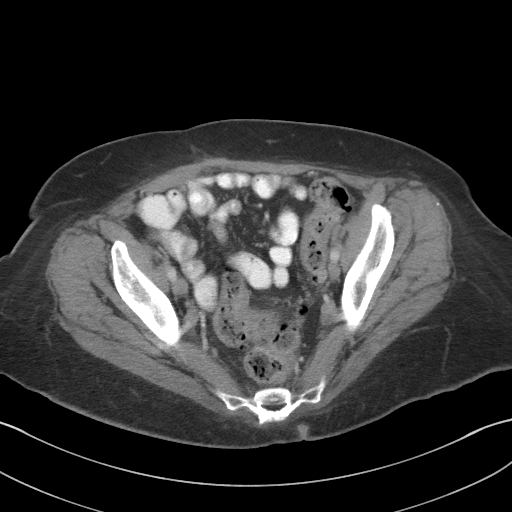
[im 30/90  soft-tissue]
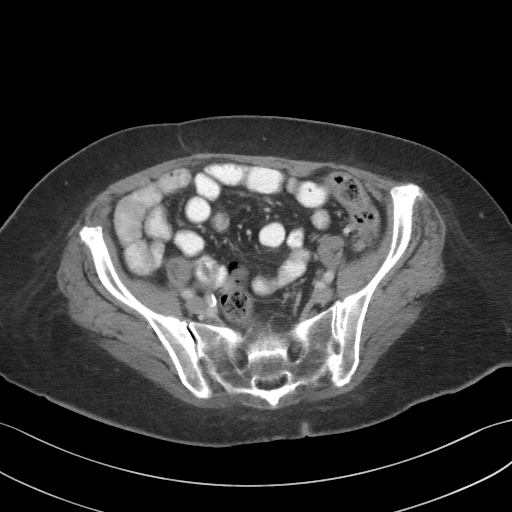
[im 36/90  soft-tissue]
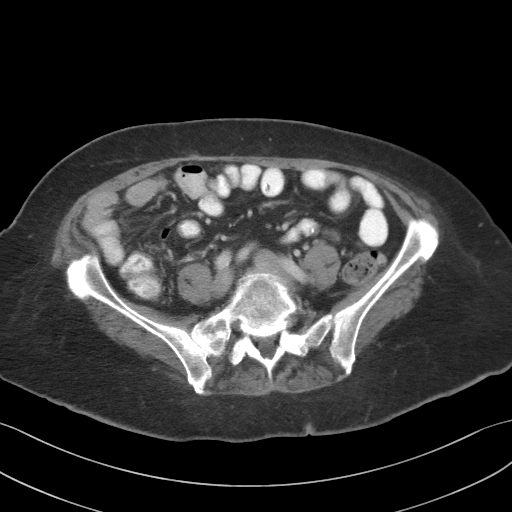
[im 48/90  soft-tissue]
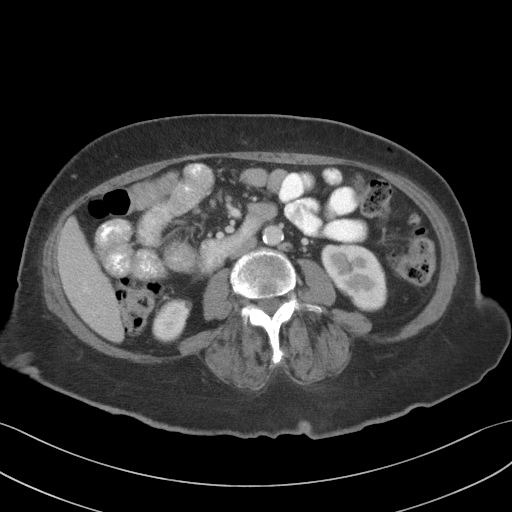
[im 54/90  soft-tissue]
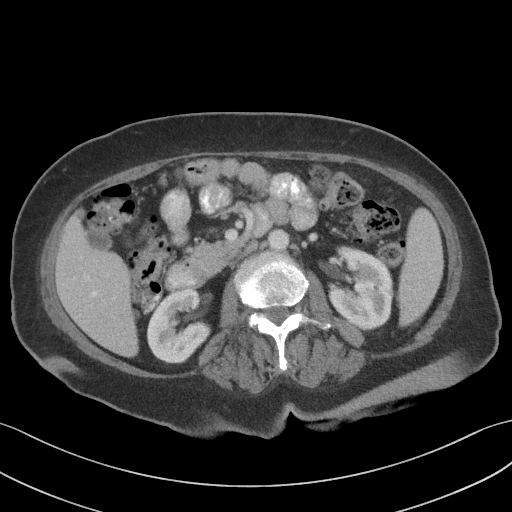
[im 60/90  soft-tissue]
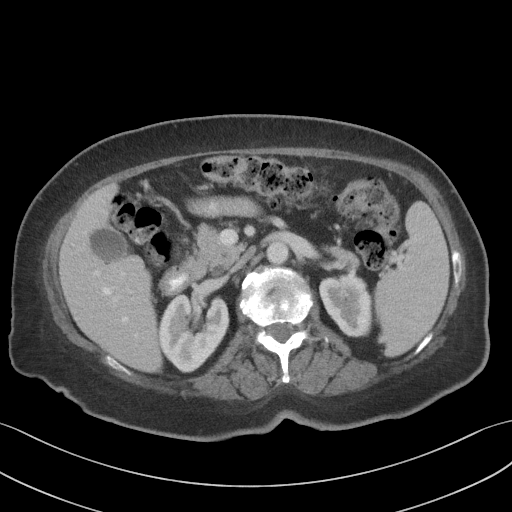
[im 60/90  bone]
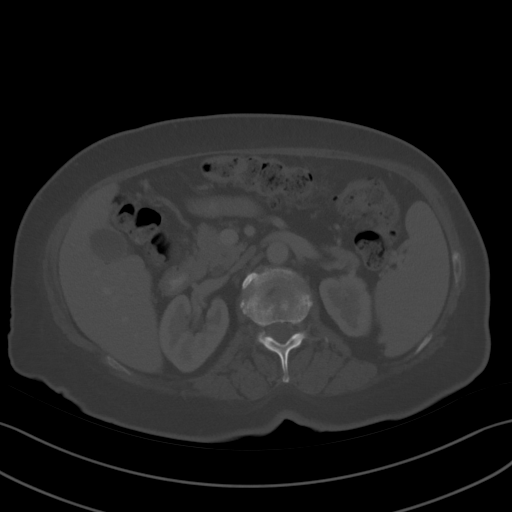
[im 66/90  soft-tissue]
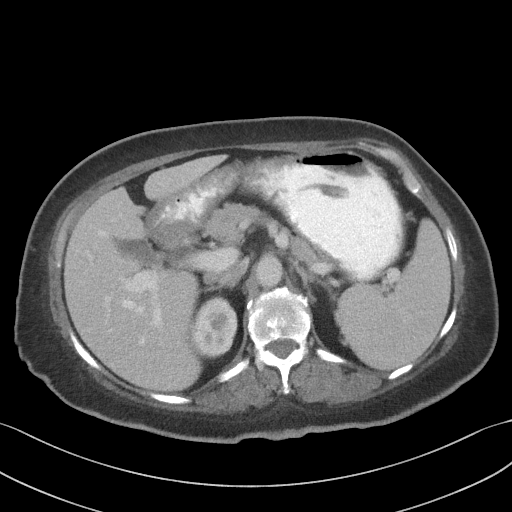
[im 72/90  soft-tissue]
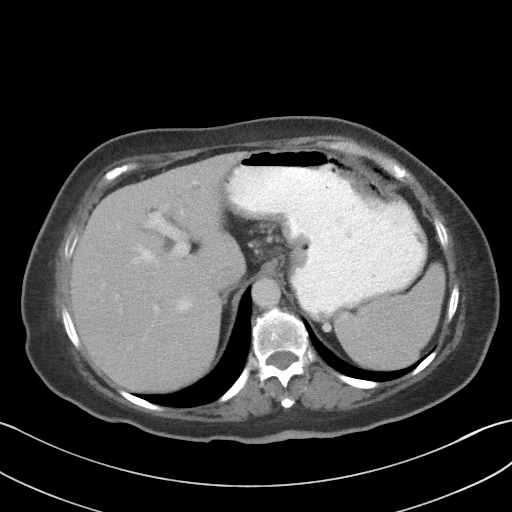
[im 78/90  soft-tissue]
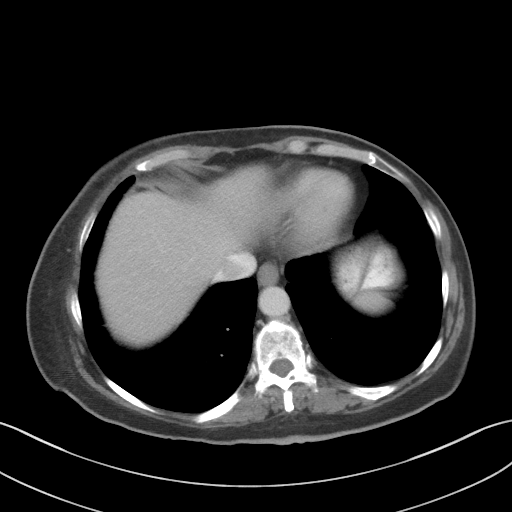
[im 84/90  soft-tissue]
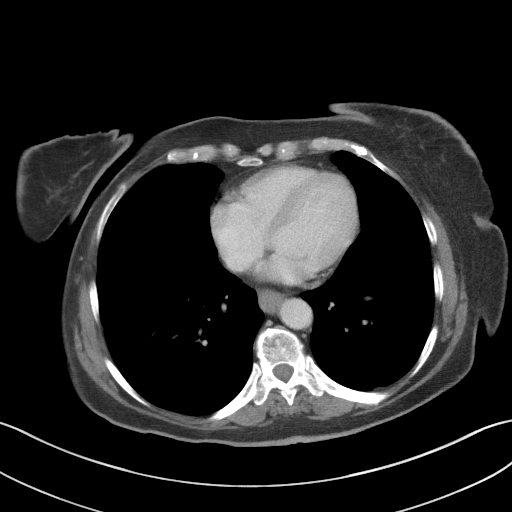

[Series 4: coronal st · coronal · 0.77mm/px · 3 of 80 slices shown]
[im 27/80  soft-tissue]
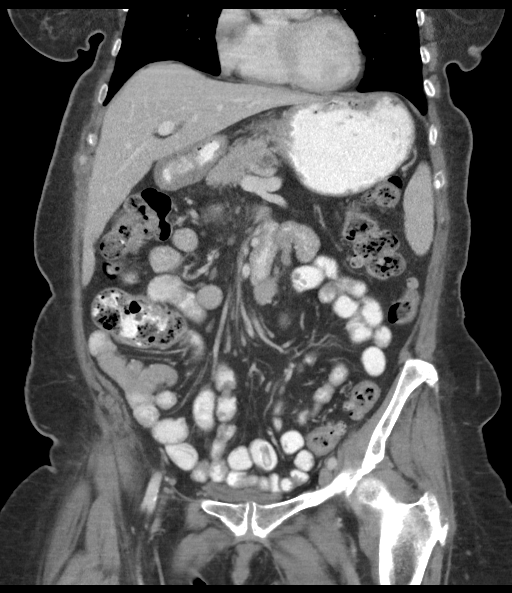
[im 36/80  soft-tissue]
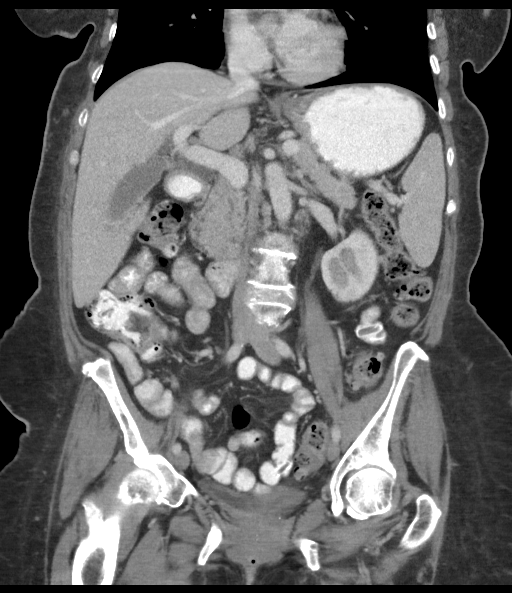
[im 44/80  soft-tissue]
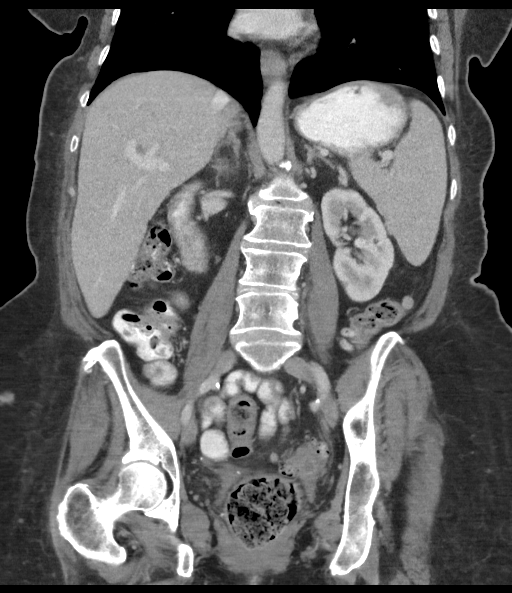

[16 of 46 positions shown; findings below may reference images not displayed]

FINDINGS: Lower chest: Unremarkable.

Hepatobiliary: No suspicious focal abnormality within the liver
parenchyma. There is no evidence for gallstones, gallbladder wall
thickening, or pericholecystic fluid. Common bile duct dilated up to
10 mm.

Pancreas: 11 mm hypoattenuating lesion identified in the body of
pancreas ([DATE]). No main duct dilatation.

Spleen: No splenomegaly. No focal mass lesion.

Adrenals/Urinary Tract: No adrenal nodule or mass. Kidneys
unremarkable. No evidence for hydroureter. The urinary bladder
appears normal for the degree of distention.

Stomach/Bowel: Stomach is distended. Duodenum is normally positioned
as is the ligament of Treitz. No small bowel wall thickening. No
small bowel dilatation. The terminal ileum is normal. The appendix
is normal. No gross colonic mass. No colonic wall thickening.
Diverticular changes are noted in the left colon without evidence of
diverticulitis.

Vascular/Lymphatic: There is abdominal aortic atherosclerosis
without aneurysm. No abdominal lymphadenopathy. No pelvic sidewall
lymphadenopathy.

Reproductive: Uterus surgically absent.  There is no adnexal mass.

Other: No intraperitoneal free fluid.

Musculoskeletal: No worrisome lytic or sclerotic osseous
abnormality.
IMPRESSION: 1. No acute findings in the abdomen or pelvis. Specifically, no
findings to explain the patient's history of epigastric pain and
weight loss.
2. 11 mm hypoattenuating lesion in the body of pancreas without main
duct dilatation. MRI of the abdomen without and with contrast
recommended to further evaluate.
3. Common bile duct dilatation. Correlation with liver function test
recommended. MRI/MRCP may prove helpful to further evaluate.
4. Left colonic diverticulosis without diverticulitis.
5. Aortic Atherosclerosis ([5U]-[5U]).

These results will be called to the ordering clinician or
representative by the Radiologist Assistant, and communication
documented in the PACS or [REDACTED].

## 2021-02-15 MED ORDER — IOHEXOL 300 MG/ML  SOLN
100.0000 mL | Freq: Once | INTRAMUSCULAR | Status: AC | PRN
  Administered 2021-02-15: 100 mL via INTRAVENOUS

## 2021-02-19 ENCOUNTER — Encounter: Payer: Self-pay | Admitting: Family Medicine

## 2021-02-19 ENCOUNTER — Other Ambulatory Visit: Payer: Self-pay | Admitting: Gastroenterology

## 2021-02-19 ENCOUNTER — Other Ambulatory Visit: Payer: Self-pay

## 2021-02-19 ENCOUNTER — Ambulatory Visit (INDEPENDENT_AMBULATORY_CARE_PROVIDER_SITE_OTHER): Payer: Medicare Other | Admitting: Family Medicine

## 2021-02-19 VITALS — BP 125/86 | HR 100 | Temp 97.2°F | Ht 61.0 in | Wt 160.0 lb

## 2021-02-19 DIAGNOSIS — F329 Major depressive disorder, single episode, unspecified: Secondary | ICD-10-CM

## 2021-02-19 DIAGNOSIS — R9389 Abnormal findings on diagnostic imaging of other specified body structures: Secondary | ICD-10-CM

## 2021-02-19 DIAGNOSIS — E1159 Type 2 diabetes mellitus with other circulatory complications: Secondary | ICD-10-CM | POA: Diagnosis not present

## 2021-02-19 DIAGNOSIS — K869 Disease of pancreas, unspecified: Secondary | ICD-10-CM

## 2021-02-19 DIAGNOSIS — I152 Hypertension secondary to endocrine disorders: Secondary | ICD-10-CM

## 2021-02-19 DIAGNOSIS — E785 Hyperlipidemia, unspecified: Secondary | ICD-10-CM

## 2021-02-19 DIAGNOSIS — Z1211 Encounter for screening for malignant neoplasm of colon: Secondary | ICD-10-CM | POA: Diagnosis not present

## 2021-02-19 DIAGNOSIS — E1169 Type 2 diabetes mellitus with other specified complication: Secondary | ICD-10-CM | POA: Diagnosis not present

## 2021-02-19 DIAGNOSIS — E1165 Type 2 diabetes mellitus with hyperglycemia: Secondary | ICD-10-CM | POA: Diagnosis not present

## 2021-02-19 LAB — BAYER DCA HB A1C WAIVED: HB A1C (BAYER DCA - WAIVED): 6.9 % (ref <7.0)

## 2021-02-19 NOTE — Progress Notes (Signed)
Subjective: CC: DM PCP: [name], DO [id number] Casey Sanders is a 60 y.o. female presenting to clinic today for:  1. Type 2 Diabetes with hypertension, hyperlipidemia:  Patient is compliant with her medications.  She did finally get to see her gastroenterologist and a CAT scan was obtained.  There was a questionable lesion on her pancreas and she is scheduled for MRI soon to further evaluate this.  She continues to have some epigastric pain with nausea and is scheduled to have a colonoscopy and endoscopy in June.  No reports of hematochezia or melena.  Thus far no changes in her medications.  She is also scheduled to have a gastric emptying test done.  She continues to have some issues with p.o. intake but eats small, bland meals as able and is hydrating with Pedialyte.  The PPI still has helped quite a bit with the burning sensation that she had.  She is continued on this medicine  Last eye exam: needs Last foot exam: UTD Last A1c:  Lab Results  Component Value Date   HGBA1C 7.4 (H) 11/28/2020   Nephropathy screen indicated?: UTD Last flu, zoster and/or pneumovax:  Immunization History  Administered Date(s) Administered  . Influenza Whole 10/20/2006  . Influenza,inj,Quad PF,6+ Mos 08/22/2013, 09/07/2014, 07/15/2016, 08/20/2017, 07/05/2019  . Moderna Sars-Covid-2 Vaccination 03/28/2020, 04/26/2020  . Pneumococcal Conjugate-13 09/07/2014  . Pneumococcal Polysaccharide-23 07/15/2016  . Td 10/20/2002   2. Hypercalcemia Uncertain etiology.  No apparent medications to induce.  She has questionable pancreatic lesion as above.  She does report feeling wobbly in the legs sometimes.  No other hypercalcemic symptoms reported today  ROS: Per HPI  Allergies  Allergen Reactions  . Codeine Hives, Nausea And Vomiting and Other (See Comments)  . Jardiance [Empagliflozin]     Caused blisters  . Metformin And Related     nausea  . Niaspan [Niacin Er] Other (See Comments)    Increase  blood glucose   Past Medical History:  Diagnosis Date  . Allergic rhinitis   . Anxiety and depression   . Asthma   . Benign essential HTN 01/04/2011  . Chest pain    a. Myoview 9/05: no ischemia, no scar, EF 70%  . Chest pain, unspecified 02/12/2011  . Cystitis 02/08/2015  . Diverticulosis   . DM2 (diabetes mellitus, type 2) ([place])   . Extremity pain 01/04/2013  . Fibromyalgia   . Frequent UTI   . GERD (gastroesophageal reflux disease)    Hiatal Hernia  . HLD (hyperlipidemia)   . HTN (hypertension)   . Left rotator cuff tear   . Low back pain 01/04/2013  . Lumbar disc disease   . Nephrolithiasis   . Obesity   . Palpitations    monitor 9/05: NSR, sinus tachy, PVCs  . Pre-operative cardiovascular examination 02/12/2011  . Rectal bleeding 08/22/2013  . Sepsis secondary to UTI ([place]) 08/09/2017  . uterine ca dx'd 1988   per pt    Current Outpatient Medications:  .  Accu-Chek Softclix Lancets lancets, Please use to test blood sugar 3 times daily as directed. DX: E11.9, Disp: 100 each, Rfl: 12 .  albuterol (VENTOLIN HFA) 108 (90 Base) MCG/ACT inhaler, Inhale 1-2 puffs into the lungs every 6 (six) hours as needed for wheezing or shortness of breath., Disp: 8 g, Rfl: 0 .  amitriptyline (ELAVIL) 25 MG tablet, Take one or two tablets at bedtime. (Patient taking differently: Take 25-50 mg by mouth at bedtime.), Disp: 60 tablet, Rfl: 0 .  aspirin 81 MG EC tablet, Take 81 mg by mouth daily., Disp: , Rfl:  .  atorvastatin (LIPITOR) 80 MG tablet, TAKE 1 TABLET BY MOUTH ONCE DAILY AT  6  PM, Disp: 90 tablet, Rfl: 0 .  Blood Glucose Monitoring Suppl (ACCU-CHEK GUIDE ME) w/Device KIT, Please use to test blood sugar 3 times daily as directed. DX: E11.9, Disp: 1 kit, Rfl: 0 .  clonazePAM (KLONOPIN) 2 MG tablet, Take 2 mg by mouth 2 (two) times daily., Disp: , Rfl:  .  cyclobenzaprine (FLEXERIL) 10 MG tablet, Take 10 mg by mouth 3 (three) times daily as needed for muscle spasms. , Disp: , Rfl: 1 .   diclofenac sodium (VOLTAREN) 1 % GEL, Apply 2 g topically 4 (four) times daily., Disp: 100 g, Rfl: 1 .  fish oil-omega-3 fatty acids 1000 MG capsule, Take 2 g by mouth daily., Disp: , Rfl:  .  gabapentin (NEURONTIN) 400 MG capsule, Take 1 capsule by mouth 4 times daily, Disp: 120 capsule, Rfl: 0 .  glucose blood (ACCU-CHEK GUIDE) test strip, Please use to test blood sugar 3 times daily as directed. DX: E11.9, Disp: 100 each, Rfl: 12 .  Insulin Pen Needle (NOVOFINE) 32G X 6 MM MISC, UAD to inject Victoza and Lantus daily. (Patient taking differently: UAD Lantus daily.), Disp: 200 each, Rfl: 1 .  LANTUS SOLOSTAR 100 UNIT/ML Solostar Pen, INJECT 48 UNITS SUBCUTANEOUSLY ONCE DAILY AT 10 PM (Patient taking differently: 30 Units.), Disp: 15 mL, Rfl: 0 .  levothyroxine (SYNTHROID) 25 MCG tablet, Take 1 tablet (25 mcg total) by mouth daily before breakfast., Disp: 90 tablet, Rfl: 0 .  lisinopril (ZESTRIL) 10 MG tablet, Take 1 tablet by mouth once daily, Disp: 90 tablet, Rfl: 0 .  pantoprazole (PROTONIX) 40 MG tablet, Take 1 tablet (40 mg total) by mouth 2 (two) times daily before a meal., Disp: 60 tablet, Rfl: 2 .  sertraline (ZOLOFT) 100 MG tablet, Take 100 mg by mouth 2 (two) times daily. , Disp: , Rfl:  .  TRULICITY 3 OY/7.7AJ SOPN, INJECT 1 DOSE SUBCUTANEOUSLY ONCE A WEEK, Disp: 12 mL, Rfl: 0 Social History   Socioeconomic History  . Marital status: Married    Spouse name: Not on file  . Number of children: 2  . Years of education: Not on file  . Highest education level: Not on file  Occupational History  . Occupation: disabled  Tobacco Use  . Smoking status: Former Research scientist (life sciences)  . Smokeless tobacco: Never Used  Vaping Use  . Vaping Use: Never used  Substance and Sexual Activity  . Alcohol use: No  . Drug use: No  . Sexual activity: Not on file  Other Topics Concern  . Not on file  Social History Narrative  . Not on file   Social Determinants of Health   Financial Resource Strain: Low Risk    . Difficulty of Paying Living Expenses: Not very hard  Food Insecurity: No Food Insecurity  . Worried About Charity fundraiser in the Last Year: Never true  . Ran Out of Food in the Last Year: Never true  Transportation Needs: No Transportation Needs  . Lack of Transportation (Medical): No  . Lack of Transportation (Non-Medical): No  Physical Activity: Not on file  Stress: Not on file  Social Connections: Moderately Integrated  . Frequency of Communication with Friends and Family: More than three times a week  . Frequency of Social Gatherings with Friends and Family: More than three times a  week  . Attends Religious Services: More than 4 times per year  . Active Member of Clubs or Organizations: Yes  . Attends Archivist Meetings: More than 4 times per year  . Marital Status: Widowed  Intimate Partner Violence: Not on file   Family History  Problem Relation Age of Onset  . Coronary artery disease Mother   . Coronary artery disease Father     Objective: Office vital signs reviewed. BP 125/86   Pulse 100   Temp (!) 97.2 F (36.2 C)   Ht '5\' 1"'  (1.549 m)   Wt 160 lb (72.6 kg)   SpO2 98%   BMI 30.23 kg/m   Physical Examination:  General: Awake, alert, chronically ill-appearing female, No acute distress HEENT: Normal; sclera white. Cardio: regular rate and rhythm, S1S2 heard, no murmurs appreciated Pulm: clear to auscultation bilaterally, no wheezes, rhonchi or rales; normal work of breathing on room air GI: Epigastric tenderness to palpation present.  No distention or guarding. MSK: Ambulating with use of walker Skin: dry; intact; no rashes or lesions Neuro: Oriented.  Follows commands.   Psych: She seems to be in fairly good spirits today and is talkative.  She does become somewhat depressed looking when she talks about her deceased spouse  Depression screen [facility] 2/9 02/19/2021 01/22/2021 01/15/2021  Decreased Interest 2 0 0  Down, Depressed, Hopeless 1 0 0  PHQ  - 2 Score 3 0 0  Altered sleeping 2 - 0  Tired, decreased energy 3 - 0  Change in appetite 3 - 0  Feeling bad or failure about yourself  2 - 0  Trouble concentrating 3 - 0  Moving slowly or fidgety/restless 2 - 0  Suicidal thoughts 0 - 0  PHQ-9 Score 18 - 0  Difficult doing work/chores Somewhat difficult - -  Some recent data might be hidden   Assessment/ Plan: 60 y.o. female   Controlled type 2 diabetes mellitus with other specified complication, without long-term current use of insulin (HCC) - Plan: Bayer DCA Hb A1c Waived  Hyperlipidemia associated with type 2 diabetes mellitus ([place])  Hypertension associated with diabetes ([place])  Hypercalcemia - Plan: PTH, Intact and Calcium  Colon cancer screening - Plan: CANCELED: Cologuard  Reactive depression  Sugars now controlled with A1c down to 6.9.  Unfortunately think this is likely due to decreased p.o. intake.  I am quite worried about this potential pancreatic lesion.  I have not gotten any information to suggest that she needs to come off of any medications just yet but I think this is further being evaluated by MRI prior to making any decisions.  She has EGD and colonoscopy scheduled.  I would like her to continue on the PPI for now  She will continue all other medications as directed.  I am checking PTH given her hypercalcemia that was noted on her last couple of lab draws.  We discussed that there is a possibility that this could be induced by other issues like a malignancy.  She is to have colon cancer screening as above.  I do not think Cologuard is an appropriate test for this patient.  Her PHQ screen today certainly suggest an undiagnosed depression despite having had normal PHQ's in the past.  I am uncertain if this is because of upcoming holidays which remind her of her spouse versus the uncertainty of her current illness.  I am going to have virtual behavioral health reach out to her and will also reach out to  CCM to see  if they might be able to give her a call.  Glad to initiate additional antidepressants if she desires but currently being treated with Zoloft  No orders of the defined types were placed in this encounter.  No orders of the defined types were placed in this encounter.    [name], DO [place] [phone]

## 2021-02-20 ENCOUNTER — Telehealth: Payer: Self-pay | Admitting: *Deleted

## 2021-02-20 LAB — PTH, INTACT AND CALCIUM
Calcium: 10.5 mg/dL — ABNORMAL HIGH (ref 8.7–10.3)
PTH: 32 pg/mL (ref 15–65)

## 2021-02-20 NOTE — Chronic Care Management (AMB) (Signed)
  Care Management   Note  02/20/2021 Name: Casey Sanders MRN: [id number] DOB: 1961-10-07  Casey Sanders is a 60 y.o. year old female who is a primary care patient of [name], DO and is actively engaged with the care management team. I reached out to [name] by phone today to assist with scheduling an initial visit with the Licensed Clinical Education officer, museum.  Follow up plan: Telephone appointment with care management team member scheduled for:02/26/2021  [place] [facility]

## 2021-02-20 NOTE — Progress Notes (Signed)
Patient is scheduled I did not need to consent her I just consented her in April.  She is aware you will be calling   Casey Sanders

## 2021-02-21 NOTE — Chronic Care Management (AMB) (Deleted)
Chronic Care Management   CCM RN Visit Note  02/07/2021 Name: Casey Sanders MRN: [id number] DOB: Feb 12, 1961  Subjective: Casey Sanders is a 60 y.o. year old female who is a primary care patient of [name], DO. The care management team was consulted for assistance with disease management and care coordination needs.    Engaged with patient by telephone for initial visit in response to provider referral for case management and/or care coordination services.   Consent to Services:  The patient was given the following information about Chronic Care Management services today, agreed to services, and gave verbal consent: 1. CCM service includes personalized support from designated clinical staff supervised by the primary care provider, including individualized plan of care and coordination with other care providers 2. 24/7 contact phone numbers for assistance for urgent and routine care needs. 3. Service will only be billed when office clinical staff spend 20 minutes or more in a month to coordinate care. 4. Only one practitioner may furnish and bill the service in a calendar month. 5.The patient may stop CCM services at any time (effective at the end of the month) by phone call to the office staff. 6. The patient will be responsible for cost sharing (co-pay) of up to 20% of the service fee (after annual deductible is met). Patient agreed to services and consent obtained.  Patient agreed to services and verbal consent obtained.   Assessment: Review of patient past medical history, allergies, medications, health status, including review of consultants reports, laboratory and other test data, was performed as part of comprehensive evaluation and provision of chronic care management services.   SDOH (Social Determinants of Health) assessments and interventions performed:    CCM Care Plan  Allergies  Allergen Reactions  . Codeine Hives, Nausea And Vomiting and Other (See Comments)  .  Jardiance [Empagliflozin]     Caused blisters  . Metformin And Related     nausea  . Niaspan [Niacin Er] Other (See Comments)    Increase blood glucose    Outpatient Encounter Medications as of 02/07/2021  Medication Sig  . Accu-Chek Softclix Lancets lancets Please use to test blood sugar 3 times daily as directed. DX: E11.9  . albuterol (VENTOLIN HFA) 108 (90 Base) MCG/ACT inhaler Inhale 1-2 puffs into the lungs every 6 (six) hours as needed for wheezing or shortness of breath.  Marland Kitchen aspirin 81 MG EC tablet Take 81 mg by mouth daily.  Marland Kitchen atorvastatin (LIPITOR) 80 MG tablet TAKE 1 TABLET BY MOUTH ONCE DAILY AT  6  PM  . Blood Glucose Monitoring Suppl (ACCU-CHEK GUIDE ME) w/Device KIT Please use to test blood sugar 3 times daily as directed. DX: E11.9  . clonazePAM (KLONOPIN) 2 MG tablet Take 2 mg by mouth 2 (two) times daily.  . cyclobenzaprine (FLEXERIL) 10 MG tablet Take 10 mg by mouth 3 (three) times daily as needed for muscle spasms.   . diclofenac sodium (VOLTAREN) 1 % GEL Apply 2 g topically 4 (four) times daily.  . fish oil-omega-3 fatty acids 1000 MG capsule Take 2 g by mouth daily.  Marland Kitchen gabapentin (NEURONTIN) 400 MG capsule Take 1 capsule by mouth 4 times daily  . glucose blood (ACCU-CHEK GUIDE) test strip Please use to test blood sugar 3 times daily as directed. DX: E11.9  . Insulin Pen Needle (NOVOFINE) 32G X 6 MM MISC UAD to inject Victoza and Lantus daily. (Patient taking differently: UAD Lantus daily.)  . LANTUS SOLOSTAR 100 UNIT/ML Solostar Pen INJECT 48  UNITS SUBCUTANEOUSLY ONCE DAILY AT 10 PM (Patient taking differently: 30 Units.)  . lisinopril (ZESTRIL) 10 MG tablet Take 1 tablet by mouth once daily  . pantoprazole (PROTONIX) 40 MG tablet Take 1 tablet (40 mg total) by mouth 2 (two) times daily before a meal. (Patient not taking: Reported on 02/19/2021)  . sertraline (ZOLOFT) 100 MG tablet Take 100 mg by mouth 2 (two) times daily.   . TRULICITY 3 KM/6.2MM SOPN INJECT 1 DOSE  SUBCUTANEOUSLY ONCE A WEEK  . [DISCONTINUED] amitriptyline (ELAVIL) 25 MG tablet Take one or two tablets at bedtime. (Patient taking differently: Take 25-50 mg by mouth at bedtime.)  . [DISCONTINUED] levothyroxine (SYNTHROID) 25 MCG tablet Take 1 tablet (25 mcg total) by mouth daily before breakfast.   No facility-administered encounter medications on file as of 02/07/2021.    Patient Active Problem List   Diagnosis Date Noted  . Hypertensive disorder 12/14/2018  . Asthma 03/09/2017  . Vitamin D deficiency 03/09/2017  . Generalized anxiety disorder 05/23/2016  . Gastroesophageal reflux disease 05/23/2016  . Depressive disorder 05/23/2016  . Fibromyalgia 05/23/2016  . Family history of cardiac disorder 05/23/2016  . Diabetic neuropathy ([place]) 12/19/2013  . Neuropathy due to type 2 diabetes mellitus ([place]) 03/17/2013  . Degeneration of lumbar intervertebral disc 01/04/2013  . Chronic pain syndrome 01/04/2013  . 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 Syncope 02/12/2011  . Diabetes mellitus ([place]) 01/04/2011  . Chronic pain 01/04/2011  . 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 Hyperlipidemia associated with type 2 diabetes mellitus ([place]) 01/04/2011  . Restless legs 01/04/2011  . 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[place]) 01/04/2011  . Palpitations 01/04/2011    Conditions to be addressed/monitored:HTN, HLD and DMII  Care Plan : RNCM: Diabetes Type 2 (Adult)    Problem: Glycemic Management (Diabetes, Type 2)   Priority: Medium    Long-Range Goal: Glycemic Management Optimized   Start Date: 02/07/2021  This Visit's Progress: On track  Priority: Medium  Note:   Objective: Lab Results  Component Value Date   HGBA1C 7.4 (H) 11/28/2020   HGBA1C 9.7 (H) 07/17/2020   Lab Results  Component Value Date   LDLCALC 67 12/14/2018   CREATININE 0.83 01/15/2021   Current Barriers:  . Chronic Disease Management support and  education needs related to diabetes in a patient with HTN, HLD, and recent GI concerns . Transportation barriers . Unable to independently drive . Recent illness and hospitalization   Nurse Case Manager Clinical Goal(s):  . patient will work with PCP to address needs related to medical management of diabetes . patient will meet with RN Care Manager to address self-management of diabetes . the patient will demonstrate ongoing self health care management ability as evidenced by checking and recording blood sugar before meals and at bedtime and by calling PCP with any readings outside of recommended range*  Interventions:  . 1:1 collaboration with Claretta Fraise, MD regarding development and update of comprehensive plan of care as evidenced by provider attestation and co-signature . Inter-disciplinary care team collaboration (see longitudinal plan of care) . Chart reviewed including relevant office notes, lab results, and hospitalization notes . Evaluation of current treatment plan related to diabetes and patient's adherence to plan as established by provider. . Reviewed and discussed medications and recent changes . Reinforced importance of medication compliance . Assessed for adequate family/social support . Discussed prior referral for transportation services o Patient was provided with appropriate resource information .  Discussed recent hospitalization for nausea and vomiting x 1 month . Discussed diet and appetite o Drinking pedialyte to replace electrolytes o Eating small meals . Reviewed upcoming appointments and encouraged o Has transportation arranged through family members . Provided with RNCM contact number and encouraged to reach out as needed  Self Care Activities:  . Self administers medications as prescribed . Calls pharmacy for medication refills . Performs ADL's independently . Calls provider office for new concerns or questions  Patient Goals Over the next 30 days,  patient will: . Check and record blood sugar before meals and at bedtime . Call PCP with any readings outside of recommended range . Take medications as prescribed . Consider Premier Protein Clear (sugar free) as a nutritional supplement since unable to eat normally . Keep appointment with gastroenterologist . Seek appropriate medical care for any new or worsening symptoms . Call RN Care Manager as needed [phone]    Care Plan : RNCM: General Plan of Care (Adult)    Problem: Health Promotion or Disease Self-Management (General Plan of Care)   Priority: Medium    Goal: Protect My Health   Start Date: 02/07/2021  This Visit's Progress: On track  Priority: Medium  Note:   Current Barriers:  Marland Kitchen Knowledge Deficits related to cause of recurrent nausea, vomiting, and weight loss in a patient with HTN, DM, HLD, and hx of cervical cancer . Unable to independently drive  Nurse Case Manager Clinical Goal(s):  . patient will work with PCP and gastroenterologist to address needs related to recurrent nausea, vomiting, and weight loss . patient will meet with RN Care Manager to address self-management of acute and chronic medical conditions and general wellbeing   Interventions:  . 1:1 collaboration with Claretta Fraise, MD regarding development and update of comprehensive plan of care as evidenced by provider attestation and co-signature . Inter-disciplinary care team collaboration (see longitudinal plan of care) . Evaluation of current treatment plan related to recurrent nausea and vomiting and patient's adherence to plan as established by provider. . Chart reviewed including relevant office notes, lab results, and hospitalization notes . Reviewed and discussed medications and changes o Patient is compliant and does not have any questions . Reviewed upcoming appointment with GI for 02/08/21 o Discussed transportation assistance. Given information by Care Guides but patient is reluctant to ride  with a strange. She has arranged for a family member to take her. . Therapeutic listening utilized . Encouraged patient to reach out to LCSW with any psychosocial concerns  . Discussed family/social support . Assessed patient reported mobility and ability to perform ADLs . Reviewed upcoming appointments . Encouraged to reach out to PCP or GI with any new or worsening symptoms or seek other appropriate medical care as needed . Provided with RNCM contact number and encouraged to reach out as needed  Self Care Activities:  . Self administers medications as prescribed . Attends all scheduled provider appointments . Calls pharmacy for medication refills . Performs ADL's independently  Patient Goals Over the next 30 days, patient will: Take medications as prescribed . Call PCP or GI with any new or worsening symptoms . Keep appointment with GI for 02/08/21 . Continue to eat small, frequent meals and to drink pedialyte to replace electrolytes  . Consider Premier Protein Clear (sugar free) as a nutritional supplement if unable to eat enough . Talk with friends/family daily . Get outside for at least a few minutes everyday    Follow Up Plan:  . Telephone  follow up appointment with care management team member scheduled for: 02/11/21 with RNCM . The patient has been provided with contact information for the care management team and has been advised to call with any health related questions or concerns.  . Next PCP appointment scheduled for: 02/19/21 with Dr Lajuana Ripple  . Appointment with [facility] on 02/08/21  Chong Sicilian, BSN, RN-BC [place] [facility] [place] [facility] Direct Dial: [phone]

## 2021-02-21 NOTE — Patient Instructions (Signed)
Visit Information   PATIENT GOALS:  Goals Addressed            This Visit's Progress   . Monitor and Manage My Blood Sugar-Diabetes Type 2   On track    Timeframe:  Long-Range Goal Priority:  Medium Start Date:    02/07/21                         Expected End Date:   10/19/21                    Follow Up Date 02/11/21   . Check and record blood sugar before meals and at bedtime . Call PCP with any readings outside of recommended range . Take medications as prescribed . Consider Premier Protein Clear (sugar free) as a nutritional supplement since unable to eat normally . Keep appointment with gastroenterologist . Seek appropriate medical care for any new or worsening symptoms . Call RN Care Manager as needed [phone]    Why is this important?    Checking your blood sugar at home helps to keep it from getting very high or very low.   Writing the results in a diary or log helps the doctor know how to care for you.   Your blood sugar log should have the time, date and the results.   Also, write down the amount of insulin or other medicine that you take.   Other information, like what you ate, exercise done and how you were feeling, will also be helpful.     Notes:     . Protect My Health   On track    Timeframe:  Short-Term Goal Priority:  High Start Date:   02/07/21                          Expected End Date:   04/03/21               Follow Up Date 02/11/21   . Call PCP or GI with any new or worsening symptoms . Keep appointment with GI for 02/08/21 . Continue to eat small, frequent meals and to drink pedialyte to replace electrolytes  . Consider Premier Protein Clear (sugar free) as a nutritional supplement if unable to eat enough . Talk with friends/family daily . Get outside for at least a few minutes everyday   Why is this important?    Screening tests can find diseases early when they are easier to treat.   Your doctor or nurse will talk with you about which  tests are important for you.   Getting shots for common diseases like the flu and shingles will help prevent them.     Notes:        Consent to CCM Services: Casey Sanders was given information about Chronic Care Management services today including:  1. CCM service includes personalized support from designated clinical staff supervised by her physician, including individualized plan of care and coordination with other care providers 2. 24/7 contact phone numbers for assistance for urgent and routine care needs. 3. Service will only be billed when office clinical staff spend 20 minutes or more in a month to coordinate care. 4. Only one practitioner may furnish and bill the service in a calendar month. 5. The patient may stop CCM services at any time (effective at the end of the month) by phone call to the office staff. 6. The patient  will be responsible for cost sharing (co-pay) of up to 20% of the service fee (after annual deductible is met).  Patient agreed to services and verbal consent obtained.   The patient verbalized understanding of instructions, educational materials, and care plan provided today and declined offer to receive copy of patient instructions, educational materials, and care plan.    Follow Up Plan:  . Telephone follow up appointment with care management team member scheduled for: 02/11/21 with RNCM . The patient has been provided with contact information for the care management team and has been advised to call with any health related questions or concerns.  . Next PCP appointment scheduled for: 02/19/21 with Dr Lajuana Ripple  . Appointment with [facility] on 02/08/21  Chong Sicilian, BSN, RN-BC Embedded Chronic Care Manager [facility] Care Management Direct Dial: [phone]   CLINICAL CARE PLAN: Patient Care Plan: RNCM: Diabetes Type 2 (Adult)    Problem Identified: Glycemic Management (Diabetes, Type 2)   Priority: Medium    Long-Range  Goal: Glycemic Management Optimized   Start Date: 02/07/2021  This Visit's Progress: On track  Priority: Medium  Note:   Objective: Lab Results  Component Value Date   HGBA1C 7.4 (H) 11/28/2020   HGBA1C 9.7 (H) 07/17/2020   Lab Results  Component Value Date   LDLCALC 67 12/14/2018   CREATININE 0.83 01/15/2021   Current Barriers:  . Chronic Disease Management support and education needs related to diabetes in a patient with HTN, HLD, and recent GI concerns . Transportation barriers . Unable to independently drive . Recent illness and hospitalization   Nurse Case Manager Clinical Goal(s):  . patient will work with PCP to address needs related to medical management of diabetes . patient will meet with RN Care Manager to address self-management of diabetes . the patient will demonstrate ongoing self health care management ability as evidenced by checking and recording blood sugar before meals and at bedtime and by calling PCP with any readings outside of recommended range*  Interventions:  . 1:1 collaboration with Claretta Fraise, MD regarding development and update of comprehensive plan of care as evidenced by provider attestation and co-signature . Inter-disciplinary care team collaboration (see longitudinal plan of care) . Chart reviewed including relevant office notes, lab results, and hospitalization notes . Evaluation of current treatment plan related to diabetes and patient's adherence to plan as established by provider. . Reviewed and discussed medications and recent changes . Reinforced importance of medication compliance . Assessed for adequate family/social support . Discussed prior referral for transportation services o Patient was provided with appropriate resource information . Discussed recent hospitalization for nausea and vomiting x 1 month . Discussed diet and appetite o Drinking pedialyte to replace electrolytes o Eating small meals . Reviewed upcoming  appointments and encouraged o Has transportation arranged through family members . Provided with RNCM contact number and encouraged to reach out as needed  Self Care Activities:  . Self administers medications as prescribed . Calls pharmacy for medication refills . Performs ADL's independently . Calls provider office for new concerns or questions  Patient Goals Over the next 30 days, patient will: . Check and record blood sugar before meals and at bedtime . Call PCP with any readings outside of recommended range . Take medications as prescribed . Consider Premier Protein Clear (sugar free) as a nutritional supplement since unable to eat normally . Keep appointment with gastroenterologist . Seek appropriate medical care for any new or worsening symptoms .  Call RN Care Manager as needed [phone]  Follow Up Plan:  . Telephone follow up appointment with care management team member scheduled for: 02/11/21 with RNCM . The patient has been provided with contact information for the care management team and has been advised to call with any health related questions or concerns.  . Next PCP appointment scheduled for: 02/19/21 with Dr Lajuana Ripple  . Appointment with [facility] on 02/08/21    Patient Care Plan: RNCM: General Plan of Care (Adult)    Problem Identified: Health Promotion or Disease Self-Management (General Plan of Care)   Priority: Medium    Goal: Protect My Health   Start Date: 02/07/2021  This Visit's Progress: On track  Priority: Medium  Note:   Current Barriers:  Marland Kitchen Knowledge Deficits related to cause of recurrent nausea, vomiting, and weight loss in a patient with HTN, DM, HLD, and hx of cervical cancer . Unable to independently drive  Nurse Case Manager Clinical Goal(s):  . patient will work with PCP and gastroenterologist to address needs related to recurrent nausea, vomiting, and weight loss . patient will meet with RN Care Manager to address self-management of acute  and chronic medical conditions and general wellbeing   Interventions:  . 1:1 collaboration with Claretta Fraise, MD regarding development and update of comprehensive plan of care as evidenced by provider attestation and co-signature . Inter-disciplinary care team collaboration (see longitudinal plan of care) . Evaluation of current treatment plan related to recurrent nausea and vomiting and patient's adherence to plan as established by provider. . Chart reviewed including relevant office notes, lab results, and hospitalization notes . Reviewed and discussed medications and changes o Patient is compliant and does not have any questions . Reviewed upcoming appointment with GI for 02/08/21 o Discussed transportation assistance. Given information by [facility] but patient is reluctant to ride with a strange. She has arranged for a family member to take her. . Therapeutic listening utilized . Encouraged patient to reach out to LCSW with any psychosocial concerns  . Discussed family/social support . Assessed patient reported mobility and ability to perform ADLs . Reviewed upcoming appointments . Encouraged to reach out to PCP or GI with any new or worsening symptoms or seek other appropriate medical care as needed . Provided with RNCM contact number and encouraged to reach out as needed  Self Care Activities:  . Self administers medications as prescribed . Attends all scheduled provider appointments . Calls pharmacy for medication refills . Performs ADL's independently  Patient Goals Over the next 30 days, patient will: Take medications as prescribed . Call PCP or GI with any new or worsening symptoms . Keep appointment with GI for 02/08/21 . Continue to eat small, frequent meals and to drink pedialyte to replace electrolytes  . Consider Premier Protein Clear (sugar free) as a nutritional supplement if unable to eat enough . Talk with friends/family daily . Get outside for at least a few  minutes everyday  Follow Up Plan:  . Telephone follow up appointment with care management team member scheduled for: 02/11/21 with RNCM . The patient has been provided with contact information for the care management team and has been advised to call with any health related questions or concerns.  . Next PCP appointment scheduled for: 02/19/21 with Dr Lajuana Ripple  . Appointment with [facility] on 02/08/21

## 2021-02-25 ENCOUNTER — Other Ambulatory Visit: Payer: Self-pay | Admitting: Family Medicine

## 2021-02-25 DIAGNOSIS — I10 Essential (primary) hypertension: Secondary | ICD-10-CM

## 2021-02-25 DIAGNOSIS — E1169 Type 2 diabetes mellitus with other specified complication: Secondary | ICD-10-CM

## 2021-02-26 ENCOUNTER — Ambulatory Visit (INDEPENDENT_AMBULATORY_CARE_PROVIDER_SITE_OTHER): Payer: Medicare Other | Admitting: Licensed Clinical Social Worker

## 2021-02-26 DIAGNOSIS — E1159 Type 2 diabetes mellitus with other circulatory complications: Secondary | ICD-10-CM

## 2021-02-26 DIAGNOSIS — C539 Malignant neoplasm of cervix uteri, unspecified: Secondary | ICD-10-CM

## 2021-02-26 DIAGNOSIS — I152 Hypertension secondary to endocrine disorders: Secondary | ICD-10-CM

## 2021-02-26 DIAGNOSIS — E1169 Type 2 diabetes mellitus with other specified complication: Secondary | ICD-10-CM

## 2021-02-26 NOTE — Patient Instructions (Signed)
Visit Information  PATIENT GOALS: Goals Addressed            This Visit's Progress   . Protect My Health;Manage Anxiety issues; Manage Depression issues       Timeframe:  Short-Term Goal Priority:  High Progress: Not on Track Start Date:   02/26/21                          Expected End Date:  05/29/21               Follow Up Date 04/08/21  Patient Self Care Activities:  . Self administers medications as prescribed  Patient Coping Strengths:  . Family . Friends  Patient Self Care Deficits:  . Anxiety issues . Depression issues  Patient Goals:  - spend time or talk with others at least 2 to 3 times per week - practice relaxation or meditation daily - keep a calendar with appointment dates  Follow Up Plan: LCSW to call client on 04/08/21       Norva Riffle.Emalia Witkop MSW, LCSW Licensed Clinical Social Worker [facility] Care Management [phone]

## 2021-02-26 NOTE — Chronic Care Management (AMB) (Signed)
Chronic Care Management    Clinical Social Work Note  02/26/2021 Name: Casey Sanders MRN: [id number] DOB: Jan 16, 1961  Casey Sanders is a 60 y.o. year old female who is a primary care patient of [name], DO. The CCM team was consulted to assist the patient with chronic disease management and/or care coordination needs related to: [employer] .   Engaged with patient by telephone for initial visit in response to provider referral for social work chronic care management and care coordination services.   Consent to Services:  The patient was given the following information about Chronic Care Management services today, agreed to services, and gave verbal consent: 1. CCM service includes personalized support from designated clinical staff supervised by the primary care provider, including individualized plan of care and coordination with other care providers 2. 24/7 contact phone numbers for assistance for urgent and routine care needs. 3. Service will only be billed when office clinical staff spend 20 minutes or more in a month to coordinate care. 4. Only one practitioner may furnish and bill the service in a calendar month. 5.The patient may stop CCM services at any time (effective at the end of the month) by phone call to the office staff. 6. The patient will be responsible for cost sharing (co-pay) of up to 20% of the service fee (after annual deductible is met). Patient agreed to services and consent obtained.  Patient agreed to services and consent obtained.   Assessment: Review of patient past medical history, allergies, medications, and health status, including review of relevant consultants reports was performed today as part of a comprehensive evaluation and provision of chronic care management and care coordination services.     SDOH (Social Determinants of Health) assessments and interventions performed:  SDOH Interventions   Flowsheet Row Most Recent Value  SDOH  Interventions   Depression Interventions/Treatment  Currently on Treatment       Advanced Directives Status: See Vynca application for related entries.  CCM Care Plan  Allergies  Allergen Reactions  . Codeine Hives, Nausea And Vomiting and Other (See Comments)  . Jardiance [Empagliflozin]     Caused blisters  . Metformin And Related     nausea  . Niaspan [Niacin Er] Other (See Comments)    Increase blood glucose    Outpatient Encounter Medications as of 02/26/2021  Medication Sig  . Accu-Chek Softclix Lancets lancets Please use to test blood sugar 3 times daily as directed. DX: E11.9  . albuterol (VENTOLIN HFA) 108 (90 Base) MCG/ACT inhaler Inhale 1-2 puffs into the lungs every 6 (six) hours as needed for wheezing or shortness of breath.  Marland Kitchen aspirin 81 MG EC tablet Take 81 mg by mouth daily.  Marland Kitchen atorvastatin (LIPITOR) 80 MG tablet TAKE 1 TABLET BY MOUTH ONCE DAILY AT 6PM  . Blood Glucose Monitoring Suppl (ACCU-CHEK GUIDE ME) w/Device KIT Please use to test blood sugar 3 times daily as directed. DX: E11.9  . clonazePAM (KLONOPIN) 2 MG tablet Take 2 mg by mouth 2 (two) times daily.  . cyclobenzaprine (FLEXERIL) 10 MG tablet Take 10 mg by mouth 3 (three) times daily as needed for muscle spasms.   . diclofenac sodium (VOLTAREN) 1 % GEL Apply 2 g topically 4 (four) times daily.  . fish oil-omega-3 fatty acids 1000 MG capsule Take 2 g by mouth daily.  Marland Kitchen gabapentin (NEURONTIN) 400 MG capsule Take 1 capsule by mouth 4 times daily  . glucose blood (ACCU-CHEK GUIDE) test strip Please use to test  blood sugar 3 times daily as directed. DX: E11.9  . Insulin Pen Needle (NOVOFINE) 32G X 6 MM MISC UAD to inject Victoza and Lantus daily. (Patient taking differently: UAD Lantus daily.)  . LANTUS SOLOSTAR 100 UNIT/ML Solostar Pen INJECT 48 UNITS SUBCUTANEOUSLY ONCE DAILY AT 10 PM (Patient taking differently: 30 Units.)  . lisinopril (ZESTRIL) 10 MG tablet Take 1 tablet by mouth once daily  .  pantoprazole (PROTONIX) 40 MG tablet Take 1 tablet (40 mg total) by mouth 2 (two) times daily before a meal. (Patient not taking: Reported on 02/19/2021)  . sertraline (ZOLOFT) 100 MG tablet Take 100 mg by mouth 2 (two) times daily.   . TRULICITY 3 MP/5.3IR SOPN INJECT 1 DOSE SUBCUTANEOUSLY ONCE A WEEK   No facility-administered encounter medications on file as of 02/26/2021.    Patient Active Problem List   Diagnosis Date Noted  . Hypertensive disorder 12/14/2018  . Asthma 03/09/2017  . Vitamin D deficiency 03/09/2017  . Generalized anxiety disorder 05/23/2016  . Gastroesophageal reflux disease 05/23/2016  . Depressive disorder 05/23/2016  . Fibromyalgia 05/23/2016  . Family history of cardiac disorder 05/23/2016  . Diabetic neuropathy ([place]) 12/19/2013  . Neuropathy due to type 2 diabetes mellitus ([place]) 03/17/2013  . Degeneration of lumbar intervertebral disc 01/04/2013  . Chronic pain syndrome 01/04/2013  . 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 Syncope 02/12/2011  . Diabetes mellitus ([place]) 01/04/2011  . Chronic pain 01/04/2011  . 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 Hyperlipidemia associated with type 2 diabetes mellitus ([place]) 01/04/2011  . Restless legs 01/04/2011  . 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[place]) 01/04/2011  . Palpitations 01/04/2011    Conditions to be addressed/monitored:Monitor client management of depression; Monitor client management of anxiety issues faced   Care Plan : LCSW Care Plan  Updates made by Katha Cabal, LCSW since 02/26/2021 12:00 AM    Problem: Emotional Distress     Goal: Emotional Health Supported;Manage Anxiety issues; Manage Depression issues   Start Date: 02/26/2021  Expected End Date: 05/29/2021  This Visit's Progress: Not on track  Priority: High  Note:   Current Barriers:  . Chronic Mental Health needs related to anxiety issues and depression issues . Mobility  challenges . Suicidal Ideation/Homicidal Ideation: No  Clinical Social Work Goal(s):  . patient will work with SW monthly by telephone or in person to reduce or manage symptoms related to anxiety issues and depression issues . patient will work with SW monthly to address concerns related to mobility of client and related to client completion of ADLs  Interventions: . 1:1 collaboration with [name], DO regarding development and update of comprehensive plan of care as evidenced by provider attestation and co-signature . Talked with client about support from [facility] with CCM program . Talked with client about pain issues . Talked with client about swallowing challenges of client . Talked with client about upcoming MRI next week . Talked with client about her decreased energy . Talked with client about mood of client . Talked with client about nausea issues of client (she said she has talked with Dr. Lajuana Ripple about nausea issues) . Talked with client about mobility of client . Talked with client about relaxation techniques of client (enjoys drawing, enjoys coloring) . Talked with client about family support (grandson lives with her and is helpful to client; daughters are supportive and both daughters live nearby) . Provided counseling support for client . Talked with  client about her counseling support with agency in [place], [place] . Talked with client about her use of supplement . Talked with client about death of her husband nearly 4 years ago . Talked with client about vision of client  Patient Self Care Activities:  . Self administers medications as prescribed  Patient Coping Strengths:  . Family . Friends  Patient Self Care Deficits:  . Anxiety issues . Depression issues  Patient Goals:  - spend time or talk with others at least 2 to 3 times per week - practice relaxation or meditation daily - keep a calendar with appointment dates  Follow Up Plan: LCSW to call  client on 04/08/21      Norva Riffle.Hyacinth Marcelli MSW, LCSW Licensed Clinical Social Worker [facility] Care Management [phone]

## 2021-02-27 ENCOUNTER — Ambulatory Visit (HOSPITAL_COMMUNITY)
Admission: RE | Admit: 2021-02-27 | Discharge: 2021-02-27 | Disposition: A | Discharge status: Home / Self Care | Payer: Medicare Other | Source: Ambulatory Visit | Attending: Gastroenterology | Admitting: Gastroenterology

## 2021-02-27 ENCOUNTER — Other Ambulatory Visit: Payer: Self-pay

## 2021-02-27 DIAGNOSIS — R112 Nausea with vomiting, unspecified: Secondary | ICD-10-CM | POA: Insufficient documentation

## 2021-02-27 DIAGNOSIS — R6881 Early satiety: Secondary | ICD-10-CM | POA: Insufficient documentation

## 2021-02-27 DIAGNOSIS — R634 Abnormal weight loss: Secondary | ICD-10-CM | POA: Insufficient documentation

## 2021-02-27 MED ORDER — TECHNETIUM TC 99M SULFUR COLLOID
2.0000 | Freq: Once | INTRAVENOUS | Status: AC | PRN
  Administered 2021-02-27: 2 via INTRAVENOUS

## 2021-03-01 ENCOUNTER — Ambulatory Visit: Payer: Medicare Other | Admitting: *Deleted

## 2021-03-01 DIAGNOSIS — E785 Hyperlipidemia, unspecified: Secondary | ICD-10-CM | POA: Diagnosis not present

## 2021-03-01 DIAGNOSIS — F329 Major depressive disorder, single episode, unspecified: Secondary | ICD-10-CM | POA: Diagnosis not present

## 2021-03-01 DIAGNOSIS — R112 Nausea with vomiting, unspecified: Secondary | ICD-10-CM

## 2021-03-01 DIAGNOSIS — E1159 Type 2 diabetes mellitus with other circulatory complications: Secondary | ICD-10-CM | POA: Diagnosis not present

## 2021-03-01 DIAGNOSIS — E1122 Type 2 diabetes mellitus with diabetic chronic kidney disease: Secondary | ICD-10-CM

## 2021-03-01 DIAGNOSIS — I152 Hypertension secondary to endocrine disorders: Secondary | ICD-10-CM

## 2021-03-01 DIAGNOSIS — N183 Chronic kidney disease, stage 3 unspecified: Secondary | ICD-10-CM | POA: Diagnosis not present

## 2021-03-01 DIAGNOSIS — E1169 Type 2 diabetes mellitus with other specified complication: Secondary | ICD-10-CM

## 2021-03-01 DIAGNOSIS — R1013 Epigastric pain: Secondary | ICD-10-CM

## 2021-03-01 NOTE — Chronic Care Management (AMB) (Signed)
Chronic Care Management   CCM RN Visit Note  03/01/2021 Name: Casey Sanders MRN: [id number] DOB: 09/19/1961  Subjective: Casey Sanders is a 60 y.o. year old female who is a primary care patient of [name], DO. The care management team was consulted for assistance with disease management and care coordination needs.    Engaged with patient by telephone for follow up visit in response to provider referral for case management and/or care coordination services.   Consent to Services:  The patient was given information about Chronic Care Management services, agreed to services, and gave verbal consent prior to initiation of services.  Please see initial visit note for detailed documentation.   Patient agreed to services and verbal consent obtained.   Assessment: Review of patient past medical history, allergies, medications, health status, including review of consultants reports, laboratory and other test data, was performed as part of comprehensive evaluation and provision of chronic care management services.   SDOH (Social Determinants of Health) assessments and interventions performed:    CCM Care Plan  Allergies  Allergen Reactions  . Codeine Hives, Nausea And Vomiting and Other (See Comments)  . Jardiance [Empagliflozin]     Caused blisters  . Metformin And Related     nausea  . Niaspan [Niacin Er] Other (See Comments)    Increase blood glucose    Outpatient Encounter Medications as of 03/01/2021  Medication Sig  . Accu-Chek Softclix Lancets lancets Please use to test blood sugar 3 times daily as directed. DX: E11.9  . albuterol (VENTOLIN HFA) 108 (90 Base) MCG/ACT inhaler Inhale 1-2 puffs into the lungs every 6 (six) hours as needed for wheezing or shortness of breath.  Marland Kitchen aspirin 81 MG EC tablet Take 81 mg by mouth daily.  Marland Kitchen atorvastatin (LIPITOR) 80 MG tablet TAKE 1 TABLET BY MOUTH ONCE DAILY AT 6PM  . Blood Glucose Monitoring Suppl (ACCU-CHEK GUIDE ME) w/Device  KIT Please use to test blood sugar 3 times daily as directed. DX: E11.9  . clonazePAM (KLONOPIN) 2 MG tablet Take 2 mg by mouth 2 (two) times daily.  . cyclobenzaprine (FLEXERIL) 10 MG tablet Take 10 mg by mouth 3 (three) times daily as needed for muscle spasms.   . diclofenac sodium (VOLTAREN) 1 % GEL Apply 2 g topically 4 (four) times daily.  . fish oil-omega-3 fatty acids 1000 MG capsule Take 2 g by mouth daily.  Marland Kitchen gabapentin (NEURONTIN) 400 MG capsule Take 1 capsule by mouth 4 times daily  . glucose blood (ACCU-CHEK GUIDE) test strip Please use to test blood sugar 3 times daily as directed. DX: E11.9  . Insulin Pen Needle (NOVOFINE) 32G X 6 MM MISC UAD to inject Victoza and Lantus daily. (Patient taking differently: UAD Lantus daily.)  . LANTUS SOLOSTAR 100 UNIT/ML Solostar Pen INJECT 48 UNITS SUBCUTANEOUSLY ONCE DAILY AT 10 PM (Patient taking differently: 30 Units.)  . lisinopril (ZESTRIL) 10 MG tablet Take 1 tablet by mouth once daily  . pantoprazole (PROTONIX) 40 MG tablet Take 1 tablet (40 mg total) by mouth 2 (two) times daily before a meal. (Patient not taking: Reported on 02/19/2021)  . sertraline (ZOLOFT) 100 MG tablet Take 100 mg by mouth 2 (two) times daily.   . TRULICITY 3 ES/9.2ZR SOPN INJECT 1 DOSE SUBCUTANEOUSLY ONCE A WEEK   No facility-administered encounter medications on file as of 03/01/2021.    Patient Active Problem List   Diagnosis Date Noted  . Hypertensive disorder 12/14/2018  . Asthma 03/09/2017  .  Vitamin D deficiency 03/09/2017  . Generalized anxiety disorder 05/23/2016  . Gastroesophageal reflux disease 05/23/2016  . Depressive disorder 05/23/2016  . Fibromyalgia 05/23/2016  . Family history of cardiac disorder 05/23/2016  . Diabetic neuropathy ([place]) 12/19/2013  . Neuropathy due to type 2 diabetes mellitus ([place]) 03/17/2013  . Degeneration of lumbar intervertebral disc 01/04/2013  . Chronic pain syndrome 01/04/2013  . Arthropathy of lumbar facet joint  01/04/2013  . Syncope 02/12/2011  . Diabetes mellitus ([place]) 01/04/2011  . Chronic pain 01/04/2011  . 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 Hyperlipidemia associated with type 2 diabetes mellitus ([place]) 01/04/2011  . Restless legs 01/04/2011  . 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[place]) 01/04/2011  . Palpitations 01/04/2011    Conditions to be addressed/monitored:DMII, Anxiety, Depression and N/V and weight loss due to unknown condition  Care Plan : RNCM: Diabetes Type 2 (Adult)  Updates made by Ilean China, RN since 03/01/2021 12:00 AM    Problem: Glycemic Management (Diabetes, Type 2)   Priority: Medium    Long-Range Goal: Glycemic Management Optimized   Start Date: 02/07/2021  Recent Progress: On track  Priority: Medium  Note:   Objective: Lab Results  Component Value Date   HGBA1C 6.9 02/19/2021   HGBA1C 7.4 (H) 11/28/2020   HGBA1C 9.7 (H) 07/17/2020   Lab Results  Component Value Date   LDLCALC 67 12/14/2018   CREATININE 0.83 01/15/2021   Current Barriers:  . Chronic Disease Management support and education needs related to diabetes in a patient with HTN, HLD, and recent GI concerns . Transportation barriers . Unable to independently drive . Recent illness and hospitalization  . N/V and decreased appetite . GI discomfort  Nurse Case Manager Clinical Goal(s):  . patient will work with PCP to address needs related to medical management of diabetes . patient will meet with RN Care Manager to address self-management of diabetes . the patient will demonstrate ongoing self health care management ability as evidenced by checking and recording blood sugar before meals and at bedtime and by calling PCP with any readings outside of recommended range*  Interventions:  . 1:1 collaboration with [name], DO regarding development and update of comprehensive plan of care as evidenced by provider attestation and  co-signature . Inter-disciplinary care team collaboration (see longitudinal plan of care) . Chart reviewed including relevant office notes, lab results, and hospitalization notes . Evaluation of current treatment plan related to diabetes and patient's adherence to plan as established by provider. . Reviewed and discussed medications and importance of adherence . Assessed for adequate family/social support . Discussed recent hospitalization for nausea and vomiting x 1 month . Discussed diet and appetite o Drinking pedialyte to replace electrolytes o Eating small meals o Still having some problems with reflux/vomiting after eating o Recommended Premier Protein Clear . Discussed blood sugar management while sick . Encouraged to continue checking blood sugar regularly and to call PCP with any readings outside of recommended range . Reviewed upcoming appointments and procedures o Patient has adequate transportation from family members . Provided with RNCM contact number and encouraged to reach out as needed  Self Care Activities:  . Self administers medications as prescribed . Calls pharmacy for medication refills . Performs ADL's independently . Calls provider office for new concerns or questions  Patient Goals Over the next 30 days, patient will: . Check and record blood sugar before meals and at bedtime . Call PCP with any readings  outside of recommended range . Take medications as prescribed . Consider Premier Protein Clear (sugar free) as a nutritional supplement since unable to eat normally . Keep all medical appointments . Seek appropriate medical care for any new or worsening symptoms . Call RN Care Manager as needed [phone]    Care Plan : RNCM: Health Promotion & [place] Self Management  Updates made by Ilean China, RN since 03/01/2021 12:00 AM    Problem: Declining Health r/t Unknown Medical Condition   Priority: Medium    Goal: Increase Knowledge of Health  Condintion and Protect Health   Start Date: 02/07/2021  This Visit's Progress: On track  Recent Progress: On track  Priority: Medium  Note:   Current Barriers:  Marland Kitchen Knowledge Deficits related to cause of recurrent nausea, vomiting, and weight loss in a patient with HTN, DM, HLD, and hx of cervical cancer . Unable to independently drive  Nurse Case Manager Clinical Goal(s):  . patient will work with PCP and gastroenterologist to address needs related to recurrent nausea, vomiting, and weight loss . patient will meet with RN Care Manager to address self-management of acute and chronic medical conditions and general wellbeing   Interventions:  . 1:1 collaboration with [name], DO regarding development and update of comprehensive plan of care as evidenced by provider attestation and co-signature . Inter-disciplinary care team collaboration (see longitudinal plan of care) . Evaluation of current treatment plan related to recurrent nausea and vomiting and patient's adherence to plan as established by provider. . Chart reviewed including relevant office notes, lab results, and hospitalization notes . Reviewed and discussed medications and changes o Patient is compliant and does not have any questions . Reviewed upcoming appointment with GI for 02/08/21 o Discussed transportation assistance. Given information by [employer] but patient is reluctant to ride with a strange. She has arranged for a family member to take her. . Therapeutic listening utilized in response to anxiety/depression associated with current state of health and prior cancer diagnosis as well as origins of anxiety/depressive disorder stemming from childhood trauma . Encouraged patient to reach out to LCSW with any psychosocial concerns  . Discussed family/social support . Assessed patient reported mobility and ability to perform ADLs . Reviewed upcoming appointments . Discussed increased upper GI discomfort since having  oral contrast for imaging procedure o Advised patient to call [facility] and let them know that she is experiencing more discomfort and difficulty with food/liquid backing up . Provided with RNCM contact number and encouraged to reach out as needed  Self Care Activities:  . Self administers medications as prescribed . Attends all scheduled provider appointments . Calls pharmacy for medication refills . Performs ADL's independently  Patient Goals Over the next 30 days, patient will: Take medications as prescribed . Call PCP or GI with any new or worsening symptoms . Keep appointment with GI and for MRCP . Continue to eat small, frequent meals and to drink pedialyte to replace electrolytes  . Consider Premier Protein Clear (sugar free) as a nutritional supplement if unable to eat enough . Talk with friends/family daily . Get outside for at least a few minutes everyday . Talk with LCSW regarding psychosocial issues . Call RN Care Manager as needed [phone]     Follow Up Plan:  . Telephone follow up appointment with care management team member scheduled for: 03/11/21 with RNCM . The patient has been provided with contact information for the care management team and has been advised to  call with any health related questions or concerns.  Chong Sicilian, BSN, RN-BC Embedded Chronic Care Manager [facility] Direct Dial: [phone]

## 2021-03-01 NOTE — Chronic Care Management (AMB) (Addendum)
Chronic Care Management   CCM RN Visit Note  02/07/2021 Name: Casey Sanders MRN: [id number] DOB: September 28, 1961  Subjective: Casey Sanders is a 60 y.o. year old female who is a primary care patient of [name], DO. The care management team was consulted for assistance with disease management and care coordination needs.    Engaged with patient by telephone for initial visit in response to provider referral for case management and/or care coordination services.   Consent to Services:  The patient was given the following information about Chronic Care Management services today, agreed to services, and gave verbal consent: 1. CCM service includes personalized support from designated clinical staff supervised by the primary care provider, including individualized plan of care and coordination with other care providers 2. 24/7 contact phone numbers for assistance for urgent and routine care needs. 3. Service will only be billed when office clinical staff spend 20 minutes or more in a month to coordinate care. 4. Only one practitioner may furnish and bill the service in a calendar month. 5.The patient may stop CCM services at any time (effective at the end of the month) by phone call to the office staff. 6. The patient will be responsible for cost sharing (co-pay) of up to 20% of the service fee (after annual deductible is met). Patient agreed to services and consent obtained.  Patient agreed to services and verbal consent obtained.   Assessment: Review of patient past medical history, allergies, medications, health status, including review of consultants reports, laboratory and other test data, was performed as part of comprehensive evaluation and provision of chronic care management services.   SDOH (Social Determinants of Health) assessments and interventions performed:    CCM Care Plan  Allergies  Allergen Reactions  . Codeine Hives, Nausea And Vomiting and Other (See Comments)  .  Jardiance [Empagliflozin]     Caused blisters  . Metformin And Related     nausea  . Niaspan [Niacin Er] Other (See Comments)    Increase blood glucose    Outpatient Encounter Medications as of 02/07/2021  Medication Sig  . Accu-Chek Softclix Lancets lancets Please use to test blood sugar 3 times daily as directed. DX: E11.9  . albuterol (VENTOLIN HFA) 108 (90 Base) MCG/ACT inhaler Inhale 1-2 puffs into the lungs every 6 (six) hours as needed for wheezing or shortness of breath.  Marland Kitchen aspirin 81 MG EC tablet Take 81 mg by mouth daily.  . Blood Glucose Monitoring Suppl (ACCU-CHEK GUIDE ME) w/Device KIT Please use to test blood sugar 3 times daily as directed. DX: E11.9  . clonazePAM (KLONOPIN) 2 MG tablet Take 2 mg by mouth 2 (two) times daily.  . cyclobenzaprine (FLEXERIL) 10 MG tablet Take 10 mg by mouth 3 (three) times daily as needed for muscle spasms.   . diclofenac sodium (VOLTAREN) 1 % GEL Apply 2 g topically 4 (four) times daily.  . fish oil-omega-3 fatty acids 1000 MG capsule Take 2 g by mouth daily.  Marland Kitchen gabapentin (NEURONTIN) 400 MG capsule Take 1 capsule by mouth 4 times daily  . glucose blood (ACCU-CHEK GUIDE) test strip Please use to test blood sugar 3 times daily as directed. DX: E11.9  . Insulin Pen Needle (NOVOFINE) 32G X 6 MM MISC UAD to inject Victoza and Lantus daily. (Patient taking differently: UAD Lantus daily.)  . LANTUS SOLOSTAR 100 UNIT/ML Solostar Pen INJECT 48 UNITS SUBCUTANEOUSLY ONCE DAILY AT 10 PM (Patient taking differently: 30 Units.)  . pantoprazole (PROTONIX) 40 MG tablet  Take 1 tablet (40 mg total) by mouth 2 (two) times daily before a meal. (Patient not taking: Reported on 02/19/2021)  . sertraline (ZOLOFT) 100 MG tablet Take 100 mg by mouth 2 (two) times daily.   . [DISCONTINUED] amitriptyline (ELAVIL) 25 MG tablet Take one or two tablets at bedtime. (Patient taking differently: Take 25-50 mg by mouth at bedtime.)  . [DISCONTINUED] atorvastatin (LIPITOR) 80 MG  tablet TAKE 1 TABLET BY MOUTH ONCE DAILY AT  6  PM  . [DISCONTINUED] levothyroxine (SYNTHROID) 25 MCG tablet Take 1 tablet (25 mcg total) by mouth daily before breakfast.  . [DISCONTINUED] lisinopril (ZESTRIL) 10 MG tablet Take 1 tablet by mouth once daily  . [DISCONTINUED] TRULICITY 3 KP/2.2ES SOPN INJECT 1 DOSE SUBCUTANEOUSLY ONCE A WEEK   No facility-administered encounter medications on file as of 02/07/2021.    Patient Active Problem List   Diagnosis Date Noted  . Hypertensive disorder 12/14/2018  . Asthma 03/09/2017  . Vitamin D deficiency 03/09/2017  . Generalized anxiety disorder 05/23/2016  . Gastroesophageal reflux disease 05/23/2016  . Depressive disorder 05/23/2016  . Fibromyalgia 05/23/2016  . Family history of cardiac disorder 05/23/2016  . Diabetic neuropathy ([place]) 12/19/2013  . Neuropathy due to type 2 diabetes mellitus ([place]) 03/17/2013  . Degeneration of lumbar intervertebral disc 01/04/2013  . Chronic pain syndrome 01/04/2013  . 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 Syncope 02/12/2011  . Diabetes mellitus ([place]) 01/04/2011  . Chronic pain 01/04/2011  . 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 Hyperlipidemia associated with type 2 diabetes mellitus ([place]) 01/04/2011  . Restless legs 01/04/2011  . 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[place]) 01/04/2011  . Palpitations 01/04/2011    Conditions to be addressed/monitored:HTN, HLD and DMII  Care Plan : RNCM: Diabetes Type 2 (Adult)    Problem: Glycemic Management (Diabetes, Type 2)   Priority: Medium    Long-Range Goal: Glycemic Management Optimized   Start Date: 02/07/2021  This Visit's Progress: On track  Priority: Medium  Note:   Objective: Lab Results  Component Value Date   HGBA1C 7.4 (H) 11/28/2020   HGBA1C 9.7 (H) 07/17/2020   Lab Results  Component Value Date   LDLCALC 67 12/14/2018   CREATININE 0.83 01/15/2021   Current Barriers:   . Chronic Disease Management support and education needs related to diabetes in a patient with HTN, HLD, and recent GI concerns . Transportation barriers . Unable to independently drive . Recent illness and hospitalization   Nurse Case Manager Clinical Goal(s):  . patient will work with PCP to address needs related to medical management of diabetes . patient will meet with RN Care Manager to address self-management of diabetes . the patient will demonstrate ongoing self health care management ability as evidenced by checking and recording blood sugar before meals and at bedtime and by calling PCP with any readings outside of recommended range*  Interventions:  . 1:1 collaboration with [name], DO regarding development and update of comprehensive plan of care as evidenced by provider attestation and co-signature . Inter-disciplinary care team collaboration (see longitudinal plan of care) . Chart reviewed including relevant office notes, lab results, and hospitalization notes . Evaluation of current treatment plan related to diabetes and patient's adherence to plan as established by provider. . Reviewed and discussed medications and recent changes . Reinforced importance of medication compliance . Assessed for adequate family/social support . Discussed prior referral for transportation services o Patient was provided with  appropriate resource information . Discussed recent hospitalization for nausea and vomiting x 1 month . Discussed diet and appetite o Drinking pedialyte to replace electrolytes o Eating small meals . Reviewed upcoming appointments and encouraged o Has transportation arranged through family members . Provided with RNCM contact number and encouraged to reach out as needed  Self Care Activities:  . Self administers medications as prescribed . Calls pharmacy for medication refills . Performs ADL's independently . Calls provider office for new concerns or  questions  Patient Goals Over the next 30 days, patient will: . Check and record blood sugar before meals and at bedtime . Call PCP with any readings outside of recommended range . Take medications as prescribed . Consider Premier Protein Clear (sugar free) as a nutritional supplement since unable to eat normally . Keep appointment with gastroenterologist . Seek appropriate medical care for any new or worsening symptoms . Call RN Care Manager as needed [phone]    Care Plan : RNCM: General Plan of Care (Adult)    Problem: Health Promotion or Disease Self-Management (General Plan of Care)   Priority: Medium    Goal: Protect My Health   Start Date: 02/07/2021  This Visit's Progress: On track  Priority: Medium  Note:   Current Barriers:  Marland Kitchen Knowledge Deficits related to cause of recurrent nausea, vomiting, and weight loss in a patient with HTN, DM, HLD, and hx of cervical cancer . Unable to independently drive  Nurse Case Manager Clinical Goal(s):  . patient will work with PCP and gastroenterologist to address needs related to recurrent nausea, vomiting, and weight loss . patient will meet with RN Care Manager to address self-management of acute and chronic medical conditions and general wellbeing   Interventions:  . 1:1 collaboration with [name], DO regarding development and update of comprehensive plan of care as evidenced by provider attestation and co-signature . Inter-disciplinary care team collaboration (see longitudinal plan of care) . Evaluation of current treatment plan related to recurrent nausea and vomiting and patient's adherence to plan as established by provider. . Chart reviewed including relevant office notes, lab results, and hospitalization notes . Reviewed and discussed medications and changes o Patient is compliant and does not have any questions . Reviewed upcoming appointment with GI for 02/08/21 o Discussed transportation assistance. Given  information by [employer] but patient is reluctant to ride with a strange. She has arranged for a family member to take her. . Therapeutic listening utilized . Encouraged patient to reach out to LCSW with any psychosocial concerns  . Discussed family/social support . Assessed patient reported mobility and ability to perform ADLs . Reviewed upcoming appointments . Encouraged to reach out to PCP or GI with any new or worsening symptoms or seek other appropriate medical care as needed . Provided with RNCM contact number and encouraged to reach out as needed  Self Care Activities:  . Self administers medications as prescribed . Attends all scheduled provider appointments . Calls pharmacy for medication refills . Performs ADL's independently  Patient Goals Over the next 30 days, patient will: Take medications as prescribed . Call PCP or GI with any new or worsening symptoms . Keep appointment with GI for 02/08/21 . Continue to eat small, frequent meals and to drink pedialyte to replace electrolytes  . Consider Premier Protein Clear (sugar free) as a nutritional supplement if unable to eat enough . Talk with friends/family daily . Get outside for at least a few minutes everyday    Follow  Up Plan:  . Telephone follow up appointment with care management team member scheduled for: 02/11/21 with RNCM . The patient has been provided with contact information for the care management team and has been advised to call with any health related questions or concerns.  . Next PCP appointment scheduled for: 02/19/21 with Dr Lajuana Ripple  . Appointment with [facility] on 02/08/21  Chong Sicilian, BSN, RN-BC [facility] Direct Dial: [phone]

## 2021-03-01 NOTE — Patient Instructions (Signed)
Visit Information  PATIENT GOALS: Goals Addressed            This Visit's Progress   . Increase Knowledge of Health Condition and Protect Health       Timeframe:  Short-Term Goal Priority:  High Progress: Not on Track Start Date:   02/26/21                          Expected End Date:  05/29/21          Follow-up: 03/11/21  . Call PCP or GI with any new or worsening symptoms . Keep appointment with GI and for MRCP . Continue to eat small, frequent meals and to drink pedialyte to replace electrolytes  . Consider Premier Protein Clear (sugar free) as a nutritional supplement if unable to eat enough . Talk with friends/family daily . Get outside for at least a few minutes everyday . Talk with LCSW regarding psychosocial issues . Call RN Care Manager as needed [phone]    . Monitor and Manage My Blood Sugar-Diabetes Type 2   On track    Timeframe:  Long-Range Goal Priority:  Medium Start Date:    02/07/21                         Expected End Date:   10/19/21                    Follow Up Date 03/11/21   . Call PCP with any readings outside of recommended range . Take medications as prescribed . Consider Premier Protein Clear (sugar free) as a nutritional supplement since unable to eat normally . Keep all medical appointments . Seek appropriate medical care for any new or worsening symptoms . Call RN Care Manager as needed [phone]   Why is this important?    Checking your blood sugar at home helps to keep it from getting very high or very low.   Writing the results in a diary or log helps the doctor know how to care for you.   Your blood sugar log should have the time, date and the results.   Also, write down the amount of insulin or other medicine that you take.   Other information, like what you ate, exercise done and how you were feeling, will also be helpful.     Notes:    The patient verbalized understanding of instructions, educational materials, and care  plan provided today and declined offer to receive copy of patient instructions, educational materials, and care plan.   Follow Up Plan:  . Telephone follow up appointment with care management team member scheduled for: 03/11/21 with RNCM . The patient has been provided with contact information for the care management team and has been advised to call with any health related questions or concerns.  Chong Sicilian, BSN, RN-BC Embedded Chronic Care Manager [facility] [place] [facility] Direct Dial: [phone]

## 2021-03-07 ENCOUNTER — Ambulatory Visit
Admission: RE | Admit: 2021-03-07 | Discharge: 2021-03-07 | Disposition: A | Discharge status: Home / Self Care | Payer: Medicare Other | Source: Ambulatory Visit | Attending: Gastroenterology | Admitting: Gastroenterology

## 2021-03-07 DIAGNOSIS — K5730 Diverticulosis of large intestine without perforation or abscess without bleeding: Secondary | ICD-10-CM | POA: Diagnosis not present

## 2021-03-07 DIAGNOSIS — R9389 Abnormal findings on diagnostic imaging of other specified body structures: Secondary | ICD-10-CM

## 2021-03-07 DIAGNOSIS — K862 Cyst of pancreas: Secondary | ICD-10-CM | POA: Diagnosis not present

## 2021-03-07 DIAGNOSIS — K869 Disease of pancreas, unspecified: Secondary | ICD-10-CM

## 2021-03-07 IMAGING — MR MR ABDOMEN WO/W CM MRCP
18 of 20 series · 44 of 48 positions shown · IV contrast (15ml multihance)
Comparison: Multiple exams, including [DATE]

CLINICAL DATA: Small pancreatic body hypodense lesion on CT of
[DATE]

EXAM:
MRI ABDOMEN WITHOUT AND WITH CONTRAST (INCLUDING MRCP)
TECHNIQUE: Multiplanar multisequence MR imaging of the abdomen was performed
both before and after the administration of intravenous contrast.
Heavily T2-weighted images of the biliary and pancreatic ducts were
obtained, and three-dimensional MRCP images were rendered by post
processing.
CONTRAST:  15mL MULTIHANCE GADOBENATE DIMEGLUMINE 529 MG/ML IV SOLN

[Series 3: T2 · coronal · 5.0mm · 1.64mm/px · 1 of 37 slices shown (1 of 4)]
[im 1/37]
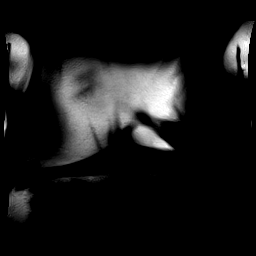

[Series 4: T1 · axial · 3.0mm · 1.64mm/px · z∈[-59,+202]mm · 5 of 176 slices shown]
[im 1/176]
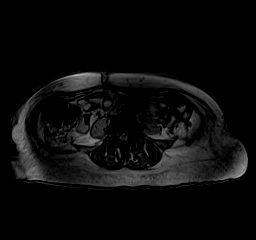
[im 44/176]
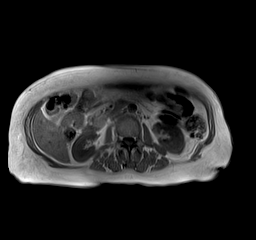
[im 88/176]
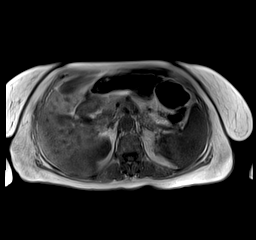
[im 132/176]
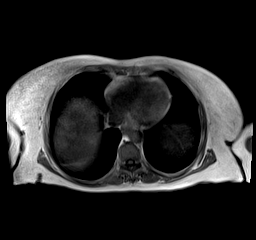
[im 176/176]
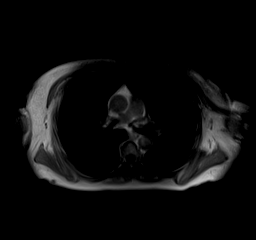

[Series 5: T2 · axial · 5.0mm · 1.64mm/px · 1 of 45 slices shown (2 of 4)]
[im 1/45]
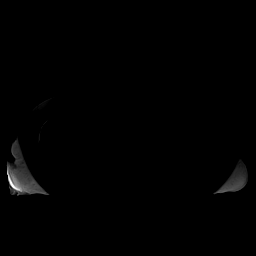

[Series 6: DWI · axial · 5.0mm · 1.49mm/px · z∈[-56,+208]mm · 4 of 135 slices shown (1 of 2)]
[im 1/135]
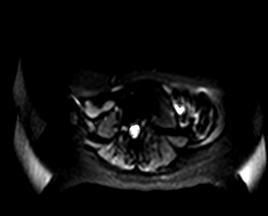
[im 45/135]
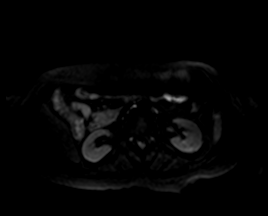
[im 90/135]
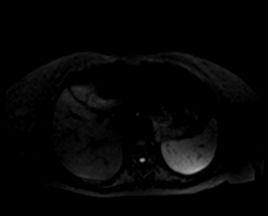
[im 135/135]
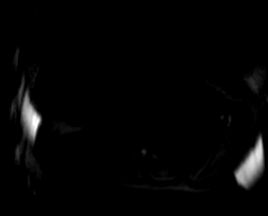

[Series 7: DWI · axial · 5.0mm · 1.49mm/px · z∈[-56,+208]mm · 2 of 45 slices shown (2 of 2)]
[im 1/45]
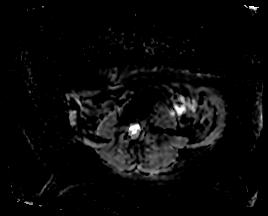
[im 45/45]
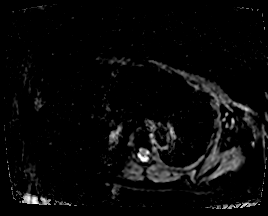

[Series 8: T2 · axial · 6.0mm · 1.25mm/px · 1 of 37 slices shown (3 of 4)]
[im 1/37]
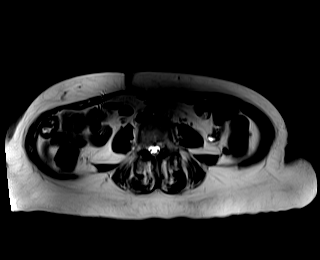

[Series 10: T2 · coronal · 3.0mm · 1.19mm/px · 1 of 19 slices shown (4 of 4)]
[im 1/19]
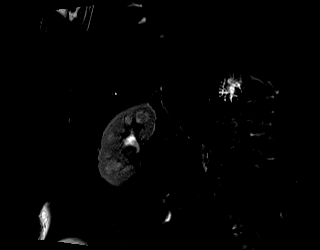

[Series 11: bSSFP · axial · 5.0mm · 1.25mm/px · 1 of 42 slices shown]
[im 1/42]
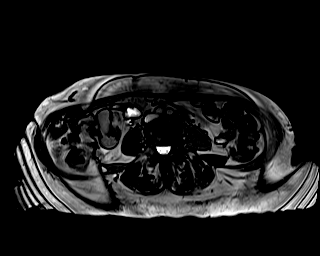

[Series 13: MRCP · coronal · 1.0mm · 0.55mm/px · 2 of 64 slices shown]
[im 1/64]
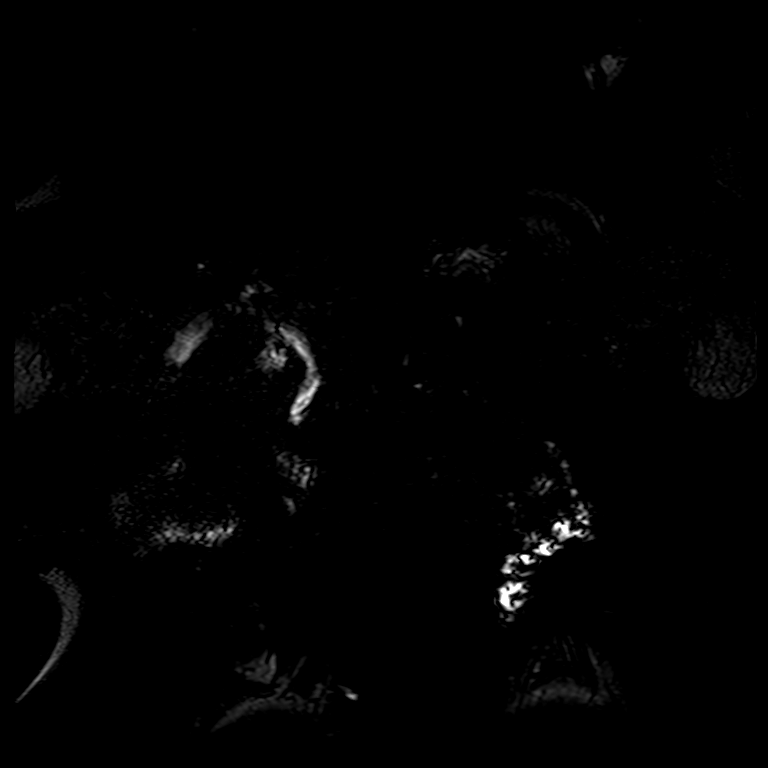
[im 64/64]
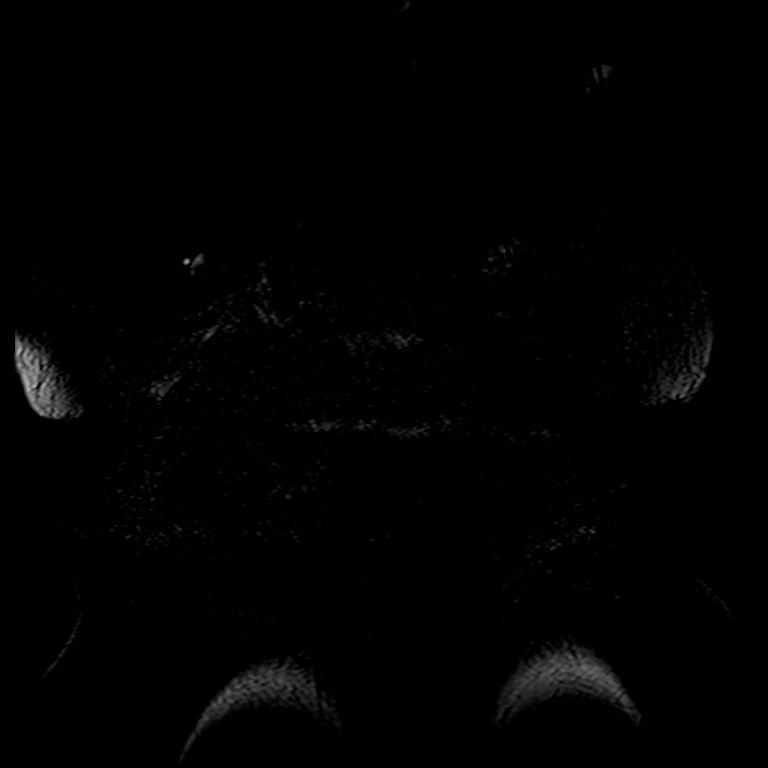

[Series 15: T1 dynamic · axial · non-contrast · 3.0mm · 1.25mm/px · z∈[-47,+190]mm · 3 of 80 slices shown]
[im 1/80]
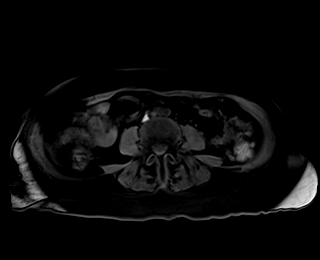
[im 40/80]
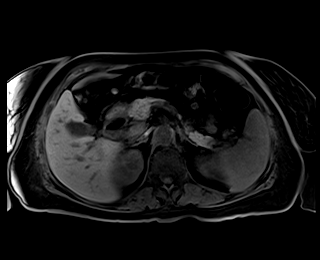
[im 80/80]
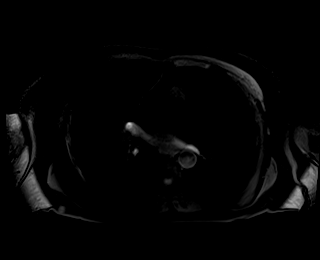

[Series 16: T1 dynamic post-contrast · axial · 3.0mm · 1.25mm/px · z∈[-47,+190]mm · 3 of 80 slices shown (1 of 5)]
[im 1/80]
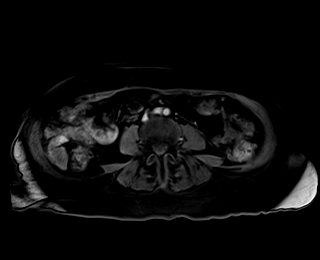
[im 40/80]
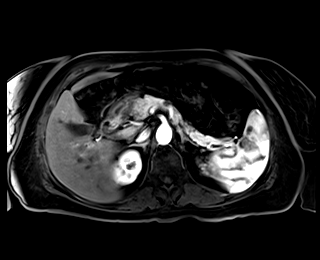
[im 80/80]
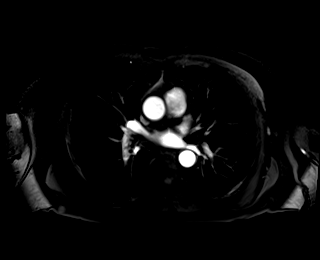

[Series 18: T1 dynamic post-contrast · axial · 3.0mm · 1.25mm/px · z∈[-47,+190]mm · 3 of 80 slices shown (2 of 5)]
[im 1/80]
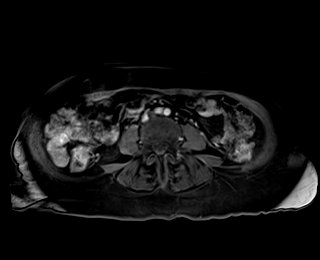
[im 40/80]
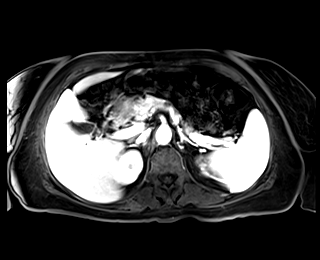
[im 80/80]
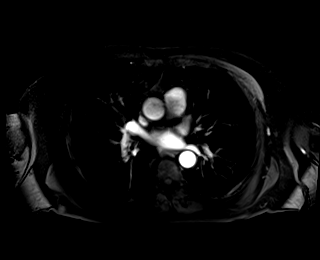

[Series 20: T1 dynamic post-contrast · axial · 3.0mm · 1.25mm/px · z∈[-47,+190]mm · 3 of 80 slices shown (3 of 5)]
[im 1/80]
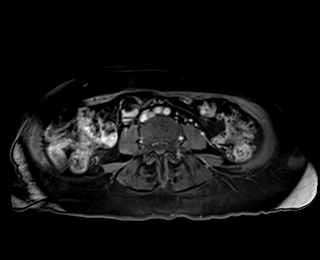
[im 40/80]
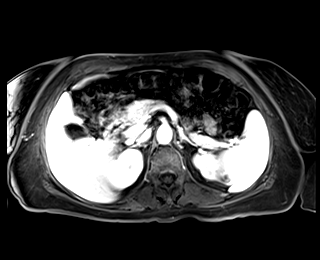
[im 80/80]
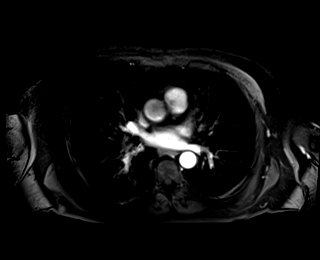

[Series 22: T1 dynamic post-contrast · coronal · 3.0mm · 1.31mm/px · 2 of 72 slices shown (4 of 5)]
[im 1/72]
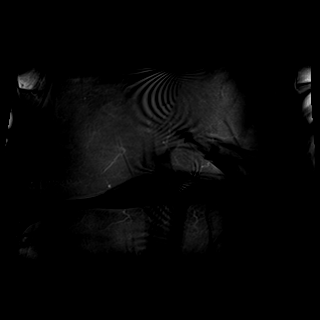
[im 72/72]
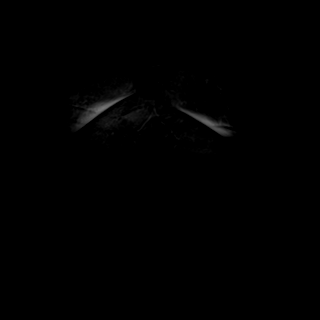

[Series 23: T1 dynamic post-contrast · axial · 3.0mm · 1.25mm/px · z∈[-47,+190]mm · 3 of 80 slices shown (5 of 5)]
[im 1/80]
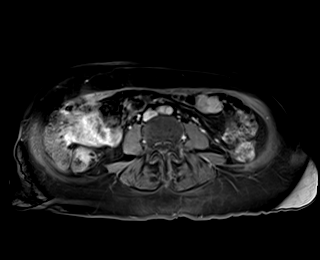
[im 40/80]
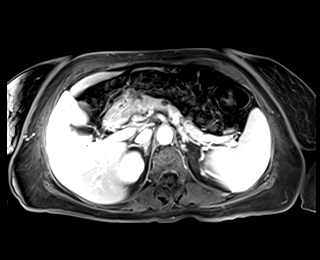
[im 80/80]
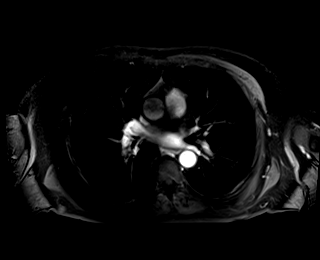

[Series 100: 25 sec sub · axial · 3.0mm · 1.25mm/px · z∈[-47,+190]mm · 3 of 80 slices shown]
[im 1/80]
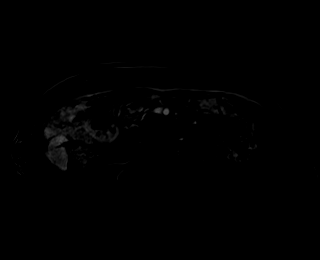
[im 40/80]
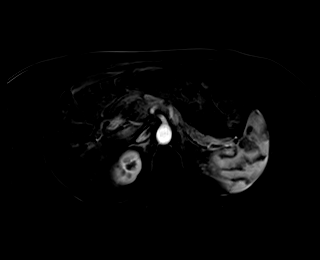
[im 80/80]
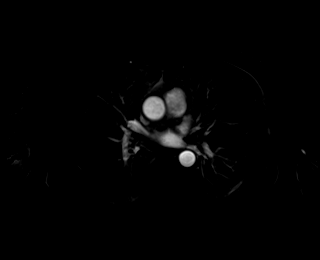

[Series 101: 45 sec sub · axial · 3.0mm · 1.25mm/px · z∈[-47,+190]mm · 3 of 80 slices shown]
[im 1/80]
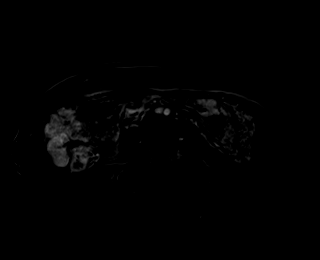
[im 40/80]
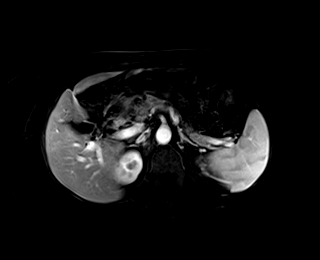
[im 80/80]
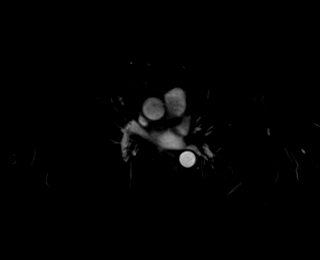

[Series 102: 90 sec sub · axial · 3.0mm · 1.25mm/px · z∈[-47,+190]mm · 3 of 80 slices shown]
[im 1/80]
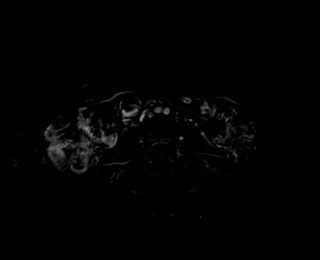
[im 40/80]
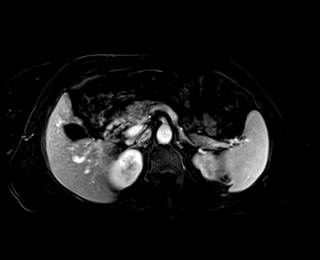
[im 80/80]
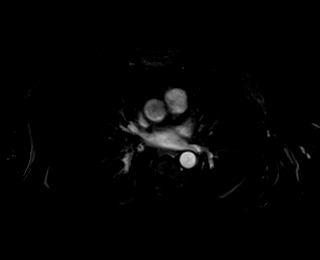

[44 of 48 positions shown; findings below may reference images not displayed]

FINDINGS: Lower chest: Unremarkable

Hepatobiliary: The common bile duct measures up to 1.0 cm in
diameter, demonstrates rest relatively gradual tapering distally
with a mildly blunted distal contour but no obvious filling defect.
Gallbladder mildly contracted but otherwise unremarkable.

Pancreas: Along the dorsal margin of the pancreatic body, a 1.0 by
0.7 by 0.7 cm cystic lesion corresponds to the finding at CT and
does not appear to directly connect to the dorsal pancreatic duct
biliary. No dilatation of the dorsal pancreatic duct. No other
significant pancreatic lesions.

Spleen: The spleen measures 12.5 by 9.6 by 5.7 cm (volume = 360
cm^3), within normal limits.

Adrenals/Urinary Tract: Tiny nonenhancing renal lesions are likely
cysts but technically nonspecific due to small size. Adrenal glands
unremarkable.

Stomach/Bowel: Descending colon diverticulosis.

Vascular/Lymphatic:  Unremarkable

Other:  No supplemental non-categorized findings.

Musculoskeletal: Lumbar spondylosis and degenerative disc disease.
IMPRESSION: 1. A 1.0 by 0.7 by 0.7 cm cystic lesion dorsally along the
pancreatic body is noted without connectivity to the dorsal
pancreatic duct or adverse features. Possibilities include a small
indolent intraductal papillary mucinous neoplasm or postinflammatory
cystic lesion. Based on patient age in size of lesion, pancreatic
protocol MRI is recommended in 1 years time. This recommendation
follows ACR consensus guidelines: Management of Incidental
Pancreatic Cysts: A White Paper of the ACR Incidental Findings
Committee. [HOSPITAL] [1A];[DATE].
2. Descending colon diverticulosis.
3. Mild extrahepatic biliary dilatation without specific cause
identified.
4. Lumbar spondylosis and degenerative disc disease.

## 2021-03-07 MED ORDER — GADOBENATE DIMEGLUMINE 529 MG/ML IV SOLN
15.0000 mL | Freq: Once | INTRAVENOUS | Status: AC | PRN
  Administered 2021-03-07: 15 mL via INTRAVENOUS

## 2021-03-08 ENCOUNTER — Encounter: Payer: Self-pay | Admitting: *Deleted

## 2021-03-08 NOTE — Chronic Care Management (AMB) (Signed)
Chronic Care Management   CCM RN Visit Note  02/11/2021 Name: Casey Sanders MRN: [id number] DOB: 1961/09/07  Subjective: Casey Sanders is a 60 y.o. year old female who is a primary care patient of [name], DO. The care management team was consulted for assistance with disease management and care coordination needs.    Engaged with patient by telephone for follow up visit in response to provider referral for case management and/or care coordination services.   Consent to Services:  The patient was given information about Chronic Care Management services, agreed to services, and gave verbal consent prior to initiation of services.  Please see initial visit note for detailed documentation.   Patient agreed to services and verbal consent obtained.   Assessment: Review of patient past medical history, allergies, medications, health status, including review of consultants reports, laboratory and other test data, was performed as part of comprehensive evaluation and provision of chronic care management services.   SDOH (Social Determinants of Health) assessments and interventions performed:    CCM Care Plan  Allergies  Allergen Reactions  . Codeine Hives, Nausea And Vomiting and Other (See Comments)  . Jardiance [Empagliflozin]     Caused blisters  . Metformin And Related     nausea  . Niaspan [Niacin Er] Other (See Comments)    Increase blood glucose    Outpatient Encounter Medications as of 02/11/2021  Medication Sig  . Accu-Chek Softclix Lancets lancets Please use to test blood sugar 3 times daily as directed. DX: E11.9  . albuterol (VENTOLIN HFA) 108 (90 Base) MCG/ACT inhaler Inhale 1-2 puffs into the lungs every 6 (six) hours as needed for wheezing or shortness of breath.  Marland Kitchen aspirin 81 MG EC tablet Take 81 mg by mouth daily.  . Blood Glucose Monitoring Suppl (ACCU-CHEK GUIDE ME) w/Device KIT Please use to test blood sugar 3 times daily as directed. DX: E11.9  .  clonazePAM (KLONOPIN) 2 MG tablet Take 2 mg by mouth 2 (two) times daily.  . cyclobenzaprine (FLEXERIL) 10 MG tablet Take 10 mg by mouth 3 (three) times daily as needed for muscle spasms.   . diclofenac sodium (VOLTAREN) 1 % GEL Apply 2 g topically 4 (four) times daily.  . fish oil-omega-3 fatty acids 1000 MG capsule Take 2 g by mouth daily.  Marland Kitchen gabapentin (NEURONTIN) 400 MG capsule Take 1 capsule by mouth 4 times daily  . glucose blood (ACCU-CHEK GUIDE) test strip Please use to test blood sugar 3 times daily as directed. DX: E11.9  . Insulin Pen Needle (NOVOFINE) 32G X 6 MM MISC UAD to inject Victoza and Lantus daily. (Patient taking differently: UAD Lantus daily.)  . LANTUS SOLOSTAR 100 UNIT/ML Solostar Pen INJECT 48 UNITS SUBCUTANEOUSLY ONCE DAILY AT 10 PM (Patient taking differently: 30 Units.)  . pantoprazole (PROTONIX) 40 MG tablet Take 1 tablet (40 mg total) by mouth 2 (two) times daily before a meal. (Patient not taking: Reported on 02/19/2021)  . sertraline (ZOLOFT) 100 MG tablet Take 100 mg by mouth 2 (two) times daily.   . [DISCONTINUED] amitriptyline (ELAVIL) 25 MG tablet Take one or two tablets at bedtime. (Patient taking differently: Take 25-50 mg by mouth at bedtime.)  . [DISCONTINUED] atorvastatin (LIPITOR) 80 MG tablet TAKE 1 TABLET BY MOUTH ONCE DAILY AT  6  PM  . [DISCONTINUED] levothyroxine (SYNTHROID) 25 MCG tablet Take 1 tablet (25 mcg total) by mouth daily before breakfast.  . [DISCONTINUED] lisinopril (ZESTRIL) 10 MG tablet Take 1 tablet by mouth  once daily  . [DISCONTINUED] TRULICITY 3 ZO/1.0RU SOPN INJECT 1 DOSE SUBCUTANEOUSLY ONCE A WEEK   No facility-administered encounter medications on file as of 02/11/2021.    Patient Active Problem List   Diagnosis Date Noted  . Hypertensive disorder 12/14/2018  . Asthma 03/09/2017  . Vitamin D deficiency 03/09/2017  . Generalized anxiety disorder 05/23/2016  . Gastroesophageal reflux disease 05/23/2016  . Depressive disorder  05/23/2016  . Fibromyalgia 05/23/2016  . Family history of cardiac disorder 05/23/2016  . Diabetic neuropathy ([place]) 12/19/2013  . Neuropathy due to type 2 diabetes mellitus ([place]) 03/17/2013  . Degeneration of lumbar intervertebral disc 01/04/2013  . Chronic pain syndrome 01/04/2013  . 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 Syncope 02/12/2011  . Diabetes mellitus ([place]) 01/04/2011  . Chronic pain 01/04/2011  . 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 Hyperlipidemia associated with type 2 diabetes mellitus ([place]) 01/04/2011  . Restless legs 01/04/2011  . 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[place]) 01/04/2011  . Palpitations 01/04/2011    Conditions to be addressed/monitored:DMII and Depression  Care Plan : RNCM: Diabetes Type 2 (Adult)  Updates made by Ilean China, RN since 03/08/2021 12:00 AM    Problem: Glycemic Management (Diabetes, Type 2)   Priority: Medium    Long-Range Goal: Glycemic Management Optimized   Start Date: 02/07/2021  This Visit's Progress: On track  Recent Progress: On track  Priority: Medium  Note:   Current Barriers:  . Chronic Disease Management support and education needs related to diabetes in a patient with HTN, HLD, and recent GI concerns . Transportation barriers . Unable to independently drive . Recent illness and hospitalization  . N/V and decreased appetite . GI discomfort  Nurse Case Manager Clinical Goal(s):  . patient will work with PCP to address needs related to medical management of diabetes . patient will meet with RN Care Manager to address self-management of diabetes . the patient will demonstrate ongoing self health care management ability as evidenced by checking and recording blood sugar before meals and at bedtime and by calling PCP with any readings outside of recommended range*  Interventions:  . 1:1 collaboration with [name], DO regarding development and  update of comprehensive plan of care as evidenced by provider attestation and co-signature . Inter-disciplinary care team collaboration (see longitudinal plan of care) . Chart reviewed including relevant office notes, lab results, and hospitalization notes . Evaluation of current treatment plan related to diabetes and patient's adherence to plan as established by provider. . Reviewed and discussed medications and importance of adherence . Assessed for adequate family/social support . Discussed diet and appetite . Discussed blood sugar management while sick . Encouraged to continue checking blood sugar regularly and to call PCP with any readings outside of recommended range . Reviewed upcoming appointments and procedures o Patient has adequate transportation from family members . Provided with RNCM contact number and encouraged to reach out as needed  Self Care Activities:  . Self administers medications as prescribed . Calls pharmacy for medication refills . Performs ADL's independently . Calls provider office for new concerns or questions  Patient Goals Over the next 30 days, patient will: . Check and record blood sugar before meals and at bedtime . Call PCP with any readings outside of recommended range . Take medications as prescribed . Keep all medical appointments . Seek appropriate medical care for any new or worsening symptoms . Call RN Care Manager as needed  [phone]     Follow Up Plan:  . Telephone follow up appointment with care management team member scheduled for:02/21/21  with RNCM . The patient has been provided with contact information for the care management team and has been advised to call with any health related questions or concerns.  Chong Sicilian, BSN, RN-BC Embedded Chronic Care Manager [facility] / [place] [facility] Direct Dial: [phone]

## 2021-03-08 NOTE — Patient Instructions (Signed)
Visit Information  PATIENT GOALS: Goals Addressed            This Visit's Progress   . Monitor and Manage My Blood Sugar-Diabetes Type 2   On track    Timeframe:  Long-Range Goal Priority:  Medium Start Date:    02/07/21                         Expected End Date:   10/19/21                    Follow Up Date 55/22   . Call PCP with any readings outside of recommended range . Take medications as prescribed . Keep all medical appointments . Seek appropriate medical care for any new or worsening symptoms . Call RN Care Manager as needed [phone]   Why is this important?    Checking your blood sugar at home helps to keep it from getting very high or very low.   Writing the results in a diary or log helps the doctor know how to care for you.   Your blood sugar log should have the time, date and the results.   Also, write down the amount of insulin or other medicine that you take.   Other information, like what you ate, exercise done and how you were feeling, will also be helpful.     Notes:        The patient verbalized understanding of instructions, educational materials, and care plan provided today and declined offer to receive copy of patient instructions, educational materials, and care plan.    Follow Up Plan:  . Telephone follow up appointment with care management team member scheduled for:02/21/21  with RNCM . The patient has been provided with contact information for the care management team and has been advised to call with any health related questions or concerns.

## 2021-03-11 ENCOUNTER — Telehealth: Payer: Medicare Other

## 2021-03-13 ENCOUNTER — Ambulatory Visit: Payer: Medicare Other | Admitting: *Deleted

## 2021-03-13 ENCOUNTER — Telehealth: Payer: Self-pay | Admitting: Family Medicine

## 2021-03-13 DIAGNOSIS — R1013 Epigastric pain: Secondary | ICD-10-CM

## 2021-03-13 DIAGNOSIS — F329 Major depressive disorder, single episode, unspecified: Secondary | ICD-10-CM

## 2021-03-13 DIAGNOSIS — E1122 Type 2 diabetes mellitus with diabetic chronic kidney disease: Secondary | ICD-10-CM

## 2021-03-13 DIAGNOSIS — N183 Chronic kidney disease, stage 3 unspecified: Secondary | ICD-10-CM

## 2021-03-13 NOTE — Telephone Encounter (Signed)
Patient advised to call [facility]'s office to receive results since this is the provider who ordered. Patient aware that Dr. Lajuana Ripple is on vacation until 03/25/21.

## 2021-03-13 NOTE — Chronic Care Management (AMB) (Signed)
Chronic Care Management   CCM RN Visit Note  03/13/2021 Name: Casey Sanders MRN: [id number] DOB: 09-Sep-1961  Subjective: Casey Sanders is a 60 y.o. year old female who is a primary care patient of [name], DO. The care management team was consulted for assistance with disease management and care coordination needs.    Engaged with patient by telephone for follow up visit in response to provider referral for case management and/or care coordination services.   Consent to Services:  The patient was given information about Chronic Care Management services, agreed to services, and gave verbal consent prior to initiation of services.  Please see initial visit note for detailed documentation.   Patient agreed to services and verbal consent obtained.   Assessment: Review of patient past medical history, allergies, medications, health status, including review of consultants reports, laboratory and other test data, was performed as part of comprehensive evaluation and provision of chronic care management services.   SDOH (Social Determinants of Health) assessments and interventions performed:    CCM Care Plan  Allergies  Allergen Reactions  . Codeine Hives, Nausea And Vomiting and Other (See Comments)  . Jardiance [Empagliflozin]     Caused blisters  . Metformin And Related     nausea  . Niaspan [Niacin Er] Other (See Comments)    Increase blood glucose    Outpatient Encounter Medications as of 03/13/2021  Medication Sig  . Accu-Chek Softclix Lancets lancets Please use to test blood sugar 3 times daily as directed. DX: E11.9  . albuterol (VENTOLIN HFA) 108 (90 Base) MCG/ACT inhaler Inhale 1-2 puffs into the lungs every 6 (six) hours as needed for wheezing or shortness of breath.  Marland Kitchen aspirin 81 MG EC tablet Take 81 mg by mouth daily.  Marland Kitchen atorvastatin (LIPITOR) 80 MG tablet TAKE 1 TABLET BY MOUTH ONCE DAILY AT 6PM  . Blood Glucose Monitoring Suppl (ACCU-CHEK GUIDE ME) w/Device  KIT Please use to test blood sugar 3 times daily as directed. DX: E11.9  . clonazePAM (KLONOPIN) 2 MG tablet Take 2 mg by mouth 2 (two) times daily.  . cyclobenzaprine (FLEXERIL) 10 MG tablet Take 10 mg by mouth 3 (three) times daily as needed for muscle spasms.   . diclofenac sodium (VOLTAREN) 1 % GEL Apply 2 g topically 4 (four) times daily.  . fish oil-omega-3 fatty acids 1000 MG capsule Take 2 g by mouth daily.  Marland Kitchen gabapentin (NEURONTIN) 400 MG capsule Take 1 capsule by mouth 4 times daily  . glucose blood (ACCU-CHEK GUIDE) test strip Please use to test blood sugar 3 times daily as directed. DX: E11.9  . Insulin Pen Needle (NOVOFINE) 32G X 6 MM MISC UAD to inject Victoza and Lantus daily. (Patient taking differently: UAD Lantus daily.)  . LANTUS SOLOSTAR 100 UNIT/ML Solostar Pen INJECT 48 UNITS SUBCUTANEOUSLY ONCE DAILY AT 10 PM (Patient taking differently: 30 Units.)  . lisinopril (ZESTRIL) 10 MG tablet Take 1 tablet by mouth once daily  . pantoprazole (PROTONIX) 40 MG tablet Take 1 tablet (40 mg total) by mouth 2 (two) times daily before a meal. (Patient not taking: Reported on 02/19/2021)  . sertraline (ZOLOFT) 100 MG tablet Take 100 mg by mouth 2 (two) times daily.   . TRULICITY 3 EY/8.1KG SOPN INJECT 1 DOSE SUBCUTANEOUSLY ONCE A WEEK   No facility-administered encounter medications on file as of 03/13/2021.    Patient Active Problem List   Diagnosis Date Noted  . Hypertensive disorder 12/14/2018  . Asthma 03/09/2017  .  Vitamin D deficiency 03/09/2017  . Generalized anxiety disorder 05/23/2016  . Gastroesophageal reflux disease 05/23/2016  . Depressive disorder 05/23/2016  . Fibromyalgia 05/23/2016  . Family history of cardiac disorder 05/23/2016  . Diabetic neuropathy ([place]) 12/19/2013  . Neuropathy due to type 2 diabetes mellitus ([place]) 03/17/2013  . Degeneration of lumbar intervertebral disc 01/04/2013  . Chronic pain syndrome 01/04/2013  . Arthropathy of lumbar facet joint  01/04/2013  . Syncope 02/12/2011  . Diabetes mellitus ([place]) 01/04/2011  . Chronic pain 01/04/2011  . 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 Hyperlipidemia associated with type 2 diabetes mellitus ([place]) 01/04/2011  . Restless legs 01/04/2011  . 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[place]) 01/04/2011  . Palpitations 01/04/2011    Conditions to be addressed/monitored:DMII, Depression and unkown cause for abd pain and N/V  Care Plan : RNCM: Health Promotion & Disesae Self Management  Updates made by Ilean China, RN since 03/13/2021 12:00 AM    Problem: Declining Health r/t Unknown Medical Condition   Priority: Medium    Goal: Increase Knowledge of Health Condintion and Protect Health   Start Date: 02/07/2021  This Visit's Progress: Not on track  Recent Progress: On track  Priority: Medium  Note:   Current Barriers:  Marland Kitchen Knowledge Deficits related to cause of recurrent nausea, vomiting, and weight loss in a patient with HTN, DM, HLD, and hx of cervical cancer . Unable to independently drive  Nurse Case Manager Clinical Goal(s):  . patient will work with PCP and gastroenterologist to address needs related to recurrent nausea, vomiting, and weight loss . patient will meet with RN Care Manager to address self-management of acute and chronic medical conditions and general wellbeing   Interventions:  . 1:1 collaboration with [name], DO regarding development and update of comprehensive plan of care as evidenced by provider attestation and co-signature . Inter-disciplinary care team collaboration (see longitudinal plan of care) . Evaluation of current treatment plan related to recurrent nausea and vomiting and patient's adherence to plan as established by provider. . Chart reviewed including relevant office notes, lab results, and hospitalization notes o Reviewed results of MRCP. Patient has not been notified of results. She has  called GI and PCP with no response.  . Advised that GI is likely the one to result the MRCP and to let me know if she doesn't hear back from them by tomorrow . Reviewed and discussed medications and changes o Patient is compliant and does not have any questions . Therapeutic listening utilized in response to anxiety/depression associated with current state of health and prior cancer diagnosis as well as origins of anxiety/depressive disorder stemming from childhood trauma . Encouraged patient to reach out to LCSW with any psychosocial concerns  . Discussed family/social support . Assessed patient reported mobility and ability to perform ADLs . Reviewed upcoming appointments . Discussed appointment for endoscopy and colonoscopy at the end of June with [facility] . Advised to seek appropriate medical attention for any new or worsening symptoms . Provided with RNCM contact number and encouraged to reach out as needed  Self Care Activities:  . Self administers medications as prescribed . Attends all scheduled provider appointments . Calls pharmacy for medication refills . Performs ADL's independently  Patient Goals Over the next 30 days, patient will: Take medications as prescribed . Call PCP or GI with any new or worsening symptoms . Talk with GI regarding results of MRCP . Continue to eat  small, frequent meals and to drink pedialyte to replace electrolytes  . Consider Premier Protein Clear (sugar free) as a nutritional supplement if unable to eat enough . Talk with friends/family daily . Get outside for at least a few minutes everyday . Talk with LCSW regarding psychosocial issues . Call RN Care Manager as needed [phone]  Follow Up Plan:  . Telephone follow up appointment with care management team member scheduled for: 03/12/21 with RNCM . The patient has been provided with contact information for the care management team and has been advised to call with any health related questions  or concerns.      Chong Sicilian, BSN, RN-BC Embedded Chronic Care Manager [facility] / [place] Management Direct Dial: [phone]

## 2021-03-13 NOTE — Patient Instructions (Signed)
Visit Information  PATIENT GOALS: Goals Addressed            This Visit's Progress   . Increase Knowledge of Health Condition and Protect Health   Not on track    Timeframe:  Short-Term Goal Priority:  High Progress: Not on Track Start Date:   02/26/21                          Expected End Date:  05/29/21          Follow-up: 03/14/21  . Call PCP or GI with any new or worsening symptoms . Talk with GI regarding results of MRCP . Continue to eat small, frequent meals and to drink pedialyte to replace electrolytes  . Consider Premier Protein Clear (sugar free) as a nutritional supplement if unable to eat enough . Talk with friends/family daily . Get outside for at least a few minutes everyday . Talk with LCSW regarding psychosocial issues . Call RN Care Manager as needed [phone]       The patient verbalized understanding of instructions, educational materials, and care plan provided today and declined offer to receive copy of patient instructions, educational materials, and care plan.   Follow Up Plan:  . Telephone follow up appointment with care management team member scheduled for: 03/12/21 with RNCM . The patient has been provided with contact information for the care management team and has been advised to call with any health related questions or concerns.  Chong Sicilian, BSN, RN-BC Embedded Chronic Care Manager [facility] / [place] Management Direct Dial: [phone]

## 2021-03-14 ENCOUNTER — Ambulatory Visit: Payer: Medicare Other | Admitting: *Deleted

## 2021-03-14 DIAGNOSIS — I152 Hypertension secondary to endocrine disorders: Secondary | ICD-10-CM | POA: Diagnosis not present

## 2021-03-14 DIAGNOSIS — N183 Chronic kidney disease, stage 3 unspecified: Secondary | ICD-10-CM

## 2021-03-14 DIAGNOSIS — E1122 Type 2 diabetes mellitus with diabetic chronic kidney disease: Secondary | ICD-10-CM | POA: Diagnosis not present

## 2021-03-14 DIAGNOSIS — F411 Generalized anxiety disorder: Secondary | ICD-10-CM

## 2021-03-14 DIAGNOSIS — E1159 Type 2 diabetes mellitus with other circulatory complications: Secondary | ICD-10-CM | POA: Diagnosis not present

## 2021-03-14 DIAGNOSIS — F329 Major depressive disorder, single episode, unspecified: Secondary | ICD-10-CM

## 2021-03-14 DIAGNOSIS — E785 Hyperlipidemia, unspecified: Secondary | ICD-10-CM | POA: Diagnosis not present

## 2021-03-14 DIAGNOSIS — E1169 Type 2 diabetes mellitus with other specified complication: Secondary | ICD-10-CM | POA: Diagnosis not present

## 2021-03-14 NOTE — Chronic Care Management (AMB) (Signed)
Chronic Care Management   CCM RN Visit Note  03/14/2021 Name: Casey Sanders MRN: [id number] DOB: 05-30-1961  Subjective: Casey Sanders is a 60 y.o. year old female who is a primary care patient of [name], DO. The care management team was consulted for assistance with disease management and care coordination needs.    Engaged with patient by telephone for follow up visit in response to provider referral for case management and/or care coordination services.   Consent to Services:  The patient was given information about Chronic Care Management services, agreed to services, and gave verbal consent prior to initiation of services.  Please see initial visit note for detailed documentation.   Patient agreed to services and verbal consent obtained.   Assessment: Review of patient past medical history, allergies, medications, health status, including review of consultants reports, laboratory and other test data, was performed as part of comprehensive evaluation and provision of chronic care management services.   SDOH (Social Determinants of Health) assessments and interventions performed:    CCM Care Plan  Allergies  Allergen Reactions  . Codeine Hives, Nausea And Vomiting and Other (See Comments)  . Jardiance [Empagliflozin]     Caused blisters  . Metformin And Related     nausea  . Niaspan [Niacin Er] Other (See Comments)    Increase blood glucose    Outpatient Encounter Medications as of 03/14/2021  Medication Sig  . Accu-Chek Softclix Lancets lancets Please use to test blood sugar 3 times daily as directed. DX: E11.9  . albuterol (VENTOLIN HFA) 108 (90 Base) MCG/ACT inhaler Inhale 1-2 puffs into the lungs every 6 (six) hours as needed for wheezing or shortness of breath.  Marland Kitchen aspirin 81 MG EC tablet Take 81 mg by mouth daily.  Marland Kitchen atorvastatin (LIPITOR) 80 MG tablet TAKE 1 TABLET BY MOUTH ONCE DAILY AT 6PM  . Blood Glucose Monitoring Suppl (ACCU-CHEK GUIDE ME) w/Device  KIT Please use to test blood sugar 3 times daily as directed. DX: E11.9  . clonazePAM (KLONOPIN) 2 MG tablet Take 2 mg by mouth 2 (two) times daily.  . cyclobenzaprine (FLEXERIL) 10 MG tablet Take 10 mg by mouth 3 (three) times daily as needed for muscle spasms.   . diclofenac sodium (VOLTAREN) 1 % GEL Apply 2 g topically 4 (four) times daily.  . fish oil-omega-3 fatty acids 1000 MG capsule Take 2 g by mouth daily.  Marland Kitchen gabapentin (NEURONTIN) 400 MG capsule Take 1 capsule by mouth 4 times daily  . glucose blood (ACCU-CHEK GUIDE) test strip Please use to test blood sugar 3 times daily as directed. DX: E11.9  . Insulin Pen Needle (NOVOFINE) 32G X 6 MM MISC UAD to inject Victoza and Lantus daily. (Patient taking differently: UAD Lantus daily.)  . LANTUS SOLOSTAR 100 UNIT/ML Solostar Pen INJECT 48 UNITS SUBCUTANEOUSLY ONCE DAILY AT 10 PM (Patient taking differently: 30 Units.)  . lisinopril (ZESTRIL) 10 MG tablet Take 1 tablet by mouth once daily  . pantoprazole (PROTONIX) 40 MG tablet Take 1 tablet (40 mg total) by mouth 2 (two) times daily before a meal. (Patient not taking: Reported on 02/19/2021)  . sertraline (ZOLOFT) 100 MG tablet Take 100 mg by mouth 2 (two) times daily.   . TRULICITY 3 HF/4.1SE SOPN INJECT 1 DOSE SUBCUTANEOUSLY ONCE A WEEK   No facility-administered encounter medications on file as of 03/14/2021.    Patient Active Problem List   Diagnosis Date Noted  . Hypertensive disorder 12/14/2018  . Asthma 03/09/2017  .  Vitamin D deficiency 03/09/2017  . Generalized anxiety disorder 05/23/2016  . Gastroesophageal reflux disease 05/23/2016  . Depressive disorder 05/23/2016  . Fibromyalgia 05/23/2016  . Family history of cardiac disorder 05/23/2016  . Diabetic neuropathy ([place]) 12/19/2013  . Neuropathy due to type 2 diabetes mellitus ([employer]) 03/17/2013  . Degeneration of lumbar intervertebral disc 01/04/2013  . Chronic pain syndrome 01/04/2013  . Arthropathy of lumbar facet joint  01/04/2013  . Syncope 02/12/2011  . Diabetes mellitus ([place]) 01/04/2011  . Chronic pain 01/04/2011  . 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 Hyperlipidemia associated with type 2 diabetes mellitus ([place]) 01/04/2011  . Restless legs 01/04/2011  . 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[place]) 01/04/2011  . Palpitations 01/04/2011    Conditions to be addressed/monitored:DMII, Anxiety and Depression  Care Plan : RNCM: Health Promotion & Disesae Self Management  Updates made by Ilean China, RN since 03/14/2021 12:00 AM    Problem: Declining Health r/t Unknown Medical Condition   Priority: Medium    Goal: Increase Knowledge of Health Condintion and Protect Health   Start Date: 02/07/2021  This Visit's Progress: On track  Recent Progress: Not on track  Priority: Medium  Note:   Current Barriers:  Marland Kitchen Knowledge Deficits related to cause of recurrent nausea, vomiting, and weight loss in a patient with HTN, DM, HLD, and hx of cervical cancer . Unable to independently drifve  Nurse Case Manager Clinical Goal(s):  . patient will work with PCP and gastroenterologist to address needs related to recurrent nausea, vomiting, and weight loss . patient will meet with RN Care Manager to address self-management of acute and chronic medical conditions and general wellbeing   Interventions:  . 1:1 collaboration with [name], DO regarding development and update of comprehensive plan of care as evidenced by provider attestation and co-signature . Inter-disciplinary care team collaboration (see longitudinal plan of care) . Evaluation of current treatment plan related to recurrent nausea and vomiting and patient's adherence to plan as established by provider. . Chart reviewed including relevant office notes, lab results, and hospitalization notes o Reviewed results of MRCP . Asked patient if she had been contacted by GI with the results of the MRCP,  and she has. No change in plans for now. Still proceeding with endoscopy and colonoscopy at the end of June unless there is a cancellation sooner.   . Reviewed and discussed medications and changes o Patient is compliant and does not have any questions . Therapeutic listening utilized in response to anxiety/depression associated with current state of health and prior cancer diagnosis as well as origins of anxiety/depressive disorder stemming from childhood trauma . Encouraged patient to reach out to LCSW with any psychosocial concerns  . Discussed family/social support . Assessed patient reported mobility and ability to perform ADLs . Reviewed upcoming appointments . Advised to seek appropriate medical attention for any new or worsening symptoms . Provided with RNCM contact number and encouraged to reach out as needed  Self Care Activities:  . Self administers medications as prescribed . Attends all scheduled provider appointments . Calls pharmacy for medication refills . Performs ADL's independently  Patient Goals Over the next 30 days, patient will: Take medications as prescribed . Call PCP or GI with any new or worsening symptoms . Continue to eat small, frequent meals and to drink pedialyte to replace electrolytes  . Consider Premier Protein Clear (sugar free) as a nutritional supplement if unable to eat enough .  Talk with friends/family daily . Get outside for at least a few minutes everyday . Talk with LCSW regarding psychosocial issues . Call RN Care Manager as needed [phone]    Follow Up Plan:  . Telephone follow up appointment with care management team member scheduled for: 04/17/21 with RNCM . The patient has been provided with contact information for the care management team and has been advised to call with any health related questions or concerns.  Chong Sicilian, BSN, RN-BC Embedded Chronic Care Manager [facility] / [place]  Management Direct Dial: [phone]

## 2021-03-14 NOTE — Patient Instructions (Signed)
Visit Information  PATIENT GOALS: Goals Addressed            This Visit's Progress   . Increase Knowledge of Health Condition and Protect Health   On track    Timeframe:  Short-Term Goal Priority:  High Progress: Not on Track Start Date:   02/26/21                          Expected End Date:  05/29/21          Follow-up: 04/17/21  . Call PCP or GI with any new or worsening symptoms . Continue to eat small, frequent meals and to drink pedialyte to replace electrolytes  . Consider Premier Protein Clear (sugar free) as a nutritional supplement if unable to eat enough . Talk with friends/family daily . Get outside for at least a few minutes everyday . Talk with LCSW regarding psychosocial issues . Call RN Care Manager as needed [phone]       Patient verbalizes understanding of instructions provided today and agrees to view in [place].    Follow Up Plan:  . Telephone follow up appointment with care management team member scheduled for: 04/17/21 with RNCM . The patient has been provided with contact information for the care management team and has been advised to call with any health related questions or concerns.  Chong Sicilian, BSN, RN-BC Embedded Chronic Care Manager [facility] / [place] Management Direct Dial: [phone]

## 2021-04-08 ENCOUNTER — Telehealth: Payer: Medicare Other

## 2021-04-17 ENCOUNTER — Encounter: Payer: Self-pay | Admitting: *Deleted

## 2021-04-17 ENCOUNTER — Ambulatory Visit (INDEPENDENT_AMBULATORY_CARE_PROVIDER_SITE_OTHER): Payer: Medicare Other | Admitting: *Deleted

## 2021-04-17 DIAGNOSIS — K5730 Diverticulosis of large intestine without perforation or abscess without bleeding: Secondary | ICD-10-CM | POA: Diagnosis not present

## 2021-04-17 DIAGNOSIS — F329 Major depressive disorder, single episode, unspecified: Secondary | ICD-10-CM

## 2021-04-17 DIAGNOSIS — F411 Generalized anxiety disorder: Secondary | ICD-10-CM

## 2021-04-17 DIAGNOSIS — R1310 Dysphagia, unspecified: Secondary | ICD-10-CM | POA: Diagnosis not present

## 2021-04-17 DIAGNOSIS — K2930 Chronic superficial gastritis without bleeding: Secondary | ICD-10-CM | POA: Diagnosis not present

## 2021-04-17 DIAGNOSIS — R112 Nausea with vomiting, unspecified: Secondary | ICD-10-CM

## 2021-04-17 DIAGNOSIS — R1013 Epigastric pain: Secondary | ICD-10-CM | POA: Diagnosis not present

## 2021-04-17 DIAGNOSIS — K648 Other hemorrhoids: Secondary | ICD-10-CM | POA: Diagnosis not present

## 2021-04-17 DIAGNOSIS — F32A Depression, unspecified: Secondary | ICD-10-CM

## 2021-04-17 DIAGNOSIS — K224 Dyskinesia of esophagus: Secondary | ICD-10-CM | POA: Diagnosis not present

## 2021-04-17 DIAGNOSIS — Z1211 Encounter for screening for malignant neoplasm of colon: Secondary | ICD-10-CM | POA: Diagnosis not present

## 2021-04-17 DIAGNOSIS — K2289 Other specified disease of esophagus: Secondary | ICD-10-CM | POA: Diagnosis not present

## 2021-04-19 ENCOUNTER — Other Ambulatory Visit: Payer: Self-pay | Admitting: Family Medicine

## 2021-04-19 DIAGNOSIS — E785 Hyperlipidemia, unspecified: Secondary | ICD-10-CM

## 2021-04-19 DIAGNOSIS — E1169 Type 2 diabetes mellitus with other specified complication: Secondary | ICD-10-CM

## 2021-04-19 DIAGNOSIS — I10 Essential (primary) hypertension: Secondary | ICD-10-CM

## 2021-04-19 NOTE — Chronic Care Management (AMB) (Signed)
Chronic Care Management   CCM RN Visit Note  04/17/21 Name: Casey Sanders MRN: [id number] DOB: August 07, 1961  Subjective: Casey Sanders is a 60 y.o. year old female who is a primary care patient of [name], DO. The care management team was consulted for assistance with disease management and care coordination needs.    Engaged with patient by telephone for follow up visit in response to provider referral for case management and/or care coordination services.   Consent to Services:  The patient was given information about Chronic Care Management services, agreed to services, and gave verbal consent prior to initiation of services.  Please see initial visit note for detailed documentation.   Patient agreed to services and verbal consent obtained.   Assessment: Review of patient past medical history, allergies, medications, health status, including review of consultants reports, laboratory and other test data, was performed as part of comprehensive evaluation and provision of chronic care management services.   SDOH (Social Determinants of Health) assessments and interventions performed:    CCM Care Plan  Allergies  Allergen Reactions   Codeine Hives, Nausea And Vomiting and Other (See Comments)   Jardiance [Empagliflozin]     Caused blisters   Metformin And Related     nausea   Niaspan [Niacin Er] Other (See Comments)    Increase blood glucose    Outpatient Encounter Medications as of 04/17/2021  Medication Sig   Accu-Chek Softclix Lancets lancets Please use to test blood sugar 3 times daily as directed. DX: E11.9   albuterol (VENTOLIN HFA) 108 (90 Base) MCG/ACT inhaler Inhale 1-2 puffs into the lungs every 6 (six) hours as needed for wheezing or shortness of breath.   aspirin 81 MG EC tablet Take 81 mg by mouth daily.   atorvastatin (LIPITOR) 80 MG tablet TAKE 1 TABLET BY MOUTH ONCE DAILY AT 6PM   Blood Glucose Monitoring Suppl (ACCU-CHEK GUIDE ME) w/Device KIT Please  use to test blood sugar 3 times daily as directed. DX: E11.9   clonazePAM (KLONOPIN) 2 MG tablet Take 2 mg by mouth 2 (two) times daily.   cyclobenzaprine (FLEXERIL) 10 MG tablet Take 10 mg by mouth 3 (three) times daily as needed for muscle spasms.    diclofenac sodium (VOLTAREN) 1 % GEL Apply 2 g topically 4 (four) times daily.   fish oil-omega-3 fatty acids 1000 MG capsule Take 2 g by mouth daily.   gabapentin (NEURONTIN) 400 MG capsule Take 1 capsule by mouth 4 times daily   glucose blood (ACCU-CHEK GUIDE) test strip Please use to test blood sugar 3 times daily as directed. DX: E11.9   Insulin Pen Needle (NOVOFINE) 32G X 6 MM MISC UAD to inject Victoza and Lantus daily. (Patient taking differently: UAD Lantus daily.)   LANTUS SOLOSTAR 100 UNIT/ML Solostar Pen INJECT 48 UNITS SUBCUTANEOUSLY ONCE DAILY AT 10 PM (Patient taking differently: 30 Units.)   lisinopril (ZESTRIL) 10 MG tablet Take 1 tablet by mouth once daily   pantoprazole (PROTONIX) 40 MG tablet Take 1 tablet (40 mg total) by mouth 2 (two) times daily before a meal. (Patient not taking: Reported on 02/19/2021)   sertraline (ZOLOFT) 100 MG tablet Take 100 mg by mouth 2 (two) times daily.    TRULICITY 3 VX/7.9TJ SOPN INJECT 1 DOSE SUBCUTANEOUSLY ONCE A WEEK   No facility-administered encounter medications on file as of 04/17/2021.    Patient Active Problem List   Diagnosis Date Noted   Hypertensive disorder 12/14/2018   Asthma 03/09/2017  Vitamin D deficiency 03/09/2017   Generalized anxiety disorder 05/23/2016   Gastroesophageal reflux disease 05/23/2016   Depressive disorder 05/23/2016   Fibromyalgia 05/23/2016   Family history of cardiac disorder 05/23/2016   Diabetic neuropathy ([place]) 12/19/2013   Neuropathy due to type 2 diabetes mellitus ([place]) 03/17/2013   Degeneration of lumbar intervertebral disc 01/04/2013   Chronic pain syndrome 01/04/2013   Arthropathy of lumbar facet joint 01/04/2013   Syncope 02/12/2011    Diabetes mellitus ([place]) 01/04/2011   Chronic pain 01/04/2011   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 01/04/2011   Hyperlipidemia associated with type 2 diabetes mellitus ([place]) 01/04/2011   Restless legs 01/04/2011   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[place]) 01/04/2011   Palpitations 01/04/2011    Conditions to be addressed/monitored:Anxiety and Depression  Care Plan : RNCM: Health Promotion & [place] Self Management     Problem: Declining Health r/t Unknown Medical Condition   Priority: Medium     Goal: Increase Knowledge of Health Condintion and Protect Health   Start Date: 02/07/2021  This Visit's Progress: On track  Recent Progress: On track  Priority: Medium  Note:   Current Barriers:  Knowledge Deficits related to cause of recurrent nausea, vomiting, and weight loss in a patient with anxiety, depression, HTN, DM, HLD, and hx of cervical cancer Unable to independently drifve  Nurse Case Manager Clinical Goal(s):  patient will work with PCP and gastroenterologist to address needs related to recurrent nausea, vomiting, and weight loss patient will meet with RN Care Manager to address self-management of acute and chronic medical conditions and general wellbeing   Interventions:  1:1 collaboration with [name], DO regarding development and update of comprehensive plan of care as evidenced by provider attestation and co-signature Inter-disciplinary care team collaboration (see longitudinal plan of care) Evaluation of current treatment plan related to recurrent nausea and vomiting and patient's adherence to plan as established by provider. Chart reviewed including relevant office notes, lab results, and hospitalization note Reviewed and discussed medications and compliance Discussed colonoscopy and endoscopy Had esophageal dilation  Colonoscopy wasn't completed due to incomplete prep and will need to be rescheduled Therapeutic  listening utilized in response to anxiety/depression associated with current state of health and medical procedures Encouraged patient to reach out to LCSW with any psychosocial concerns  Reassessed family/social support Reviewed upcoming appointments Advised to seek appropriate medical attention for any new or worsening symptoms Encouraged to reach out as needed  Self Care Activities:  Self administers medications as prescribed Attends all scheduled provider appointments Calls pharmacy for medication refills Performs ADL's independently  Patient Goals Over the next 90 days, patient will: Take medications as prescribed Call PCP or GI with any new or worsening symptoms Continue to eat small, frequent meals and to drink pedialyte to replace electrolytes  Schedule repeat colonoscopy and complete prep Talk with friends/family daily Get outside for at least a few minutes everyday Talk with LCSW regarding psychosocial issues Call RN Care Manager as needed [phone]  Follow Up Plan:  Telephone follow up appointment with care management team member scheduled for: 05/21/21 with [facility] The patient has been provided with contact information for the care management team and has been advised to call with any health related questions or concerns.      Plan:Telephone follow up appointment with care management team member scheduled for:  05/21/21 with RNCM  Chong Sicilian, BSN, RN-BC [place] / [place] Management  Direct Dial: [phone]

## 2021-04-19 NOTE — Patient Instructions (Signed)
Visit Information  PATIENT GOALS:  Goals Addressed             This Visit's Progress    Increase Knowledge of Health Condition and Protect Health   On track    Timeframe:  Short-Term Goal Priority:  High Progress: Not on Track Start Date:   02/26/21                          Expected End Date:  05/29/21          Follow-up: 05/21/21  Call PCP or GI with any new or worsening symptoms Continue to eat small, frequent meals and to drink pedialyte to replace electrolytes  Consider Premier Protein Clear (sugar free) as a nutritional supplement if unable to eat enough Schedule colonoscopy Talk with friends/family daily Get outside for at least a few minutes everyday Talk with LCSW regarding psychosocial issues Call [place] as needed [phone]         The patient verbalized understanding of instructions, educational materials, and care plan provided today and declined offer to receive copy of patient instructions, educational materials, and care plan.   Telephone follow up appointment with care management team member scheduled for: 05/21/21 with RNCM  Chong Sicilian, BSN, RN-BC [place] / [place] Management Direct Dial: [phone]

## 2021-04-23 DIAGNOSIS — K2289 Other specified disease of esophagus: Secondary | ICD-10-CM | POA: Diagnosis not present

## 2021-04-23 DIAGNOSIS — K2930 Chronic superficial gastritis without bleeding: Secondary | ICD-10-CM | POA: Diagnosis not present

## 2021-05-21 ENCOUNTER — Ambulatory Visit (INDEPENDENT_AMBULATORY_CARE_PROVIDER_SITE_OTHER): Payer: Medicare Other | Admitting: *Deleted

## 2021-05-21 DIAGNOSIS — E1122 Type 2 diabetes mellitus with diabetic chronic kidney disease: Secondary | ICD-10-CM

## 2021-05-21 DIAGNOSIS — E1159 Type 2 diabetes mellitus with other circulatory complications: Secondary | ICD-10-CM

## 2021-05-21 DIAGNOSIS — N183 Chronic kidney disease, stage 3 unspecified: Secondary | ICD-10-CM

## 2021-05-21 DIAGNOSIS — R1013 Epigastric pain: Secondary | ICD-10-CM

## 2021-05-22 NOTE — Patient Instructions (Signed)
Visit Information  PATIENT GOALS:  Goals Addressed             This Visit's Progress    Increase Knowledge of Health Condition and Protect Health   On track    Timeframe:  Short-Term Goal Priority:  High Progress: Not on Track Start Date:   02/26/21                          Expected End Date:  05/29/21          Follow-up: 05/31/21  Call PCP or GI with any new or worsening symptoms Continue to eat small, frequent meals and to drink pedialyte to replace electrolytes  Consider Premier Protein Clear (sugar free) as a nutritional supplement if unable to eat enough Talk with friends/family daily Get outside for at least a few minutes everyday Talk with LCSW regarding psychosocial issues Call RN Care Manager as needed [phone]     Monitor and Manage My Blood Sugar-Diabetes Type 2   On track    Timeframe:  Long-Range Goal Priority:  Medium Start Date:    02/07/21                         Expected End Date:   10/19/21                    Follow Up Date 05/31/21   Call PCP with any readings outside of recommended range Take medications as prescribed Keep all medical appointments Seek appropriate medical care for any new or worsening symptoms Call RN Care Manager as needed [phone]   Why is this important?   Checking your blood sugar at home helps to keep it from getting very high or very low.  Writing the results in a diary or log helps the doctor know how to care for you.  Your blood sugar log should have the time, date and the results.  Also, write down the amount of insulin or other medicine that you take.  Other information, like what you ate, exercise done and how you were feeling, will also be helpful.     Notes:         The patient verbalized understanding of instructions, educational materials, and care plan provided today and declined offer to receive copy of patient instructions, educational materials, and care plan.   Plan:Telephone follow up appointment  with care management team member scheduled for:  05/31/21 with RNCM and The patient has been provided with contact information for the care management team and has been advised to call with any health related questions or concerns.   Chong Sicilian, BSN, RN-BC Embedded Chronic Care Manager [facility] / [place] Management Direct Dial: [phone]

## 2021-05-22 NOTE — Chronic Care Management (AMB) (Signed)
Chronic Care Management   CCM RN Visit Note  05/21/2021 Name: Casey Sanders MRN: [id number] DOB: January 21, 1961  Subjective: Casey Sanders is a 60 y.o. year old female who is a primary care patient of [name], DO. The care management team was consulted for assistance with disease management and care coordination needs.    Engaged with patient by telephone for follow up visit in response to provider referral for case management and/or care coordination services.   Consent to Services:  The patient was given information about Chronic Care Management services, agreed to services, and gave verbal consent prior to initiation of services.  Please see initial visit note for detailed documentation.   Patient agreed to services and verbal consent obtained.   Assessment: Review of patient past medical history, allergies, medications, health status, including review of consultants reports, laboratory and other test data, was performed as part of comprehensive evaluation and provision of chronic care management services.   SDOH (Social Determinants of Health) assessments and interventions performed:    CCM Care Plan  Allergies  Allergen Reactions   Codeine Hives, Nausea And Vomiting and Other (See Comments)   Jardiance [Empagliflozin]     Caused blisters   Metformin And Related     nausea   Niaspan [Niacin Er] Other (See Comments)    Increase blood glucose    Outpatient Encounter Medications as of 05/21/2021  Medication Sig Note   Accu-Chek Softclix Lancets lancets Please use to test blood sugar 3 times daily as directed. DX: E11.9    albuterol (VENTOLIN HFA) 108 (90 Base) MCG/ACT inhaler Inhale 1-2 puffs into the lungs every 6 (six) hours as needed for wheezing or shortness of breath.    aspirin 81 MG EC tablet Take 81 mg by mouth daily.    atorvastatin (LIPITOR) 80 MG tablet TAKE 1 TABLET BY MOUTH ONCE DAILY AT  6  PM    Blood Glucose Monitoring Suppl (ACCU-CHEK GUIDE ME) w/Device  KIT Please use to test blood sugar 3 times daily as directed. DX: E11.9    clonazePAM (KLONOPIN) 2 MG tablet Take 2 mg by mouth 2 (two) times daily.    cyclobenzaprine (FLEXERIL) 10 MG tablet Take 10 mg by mouth 3 (three) times daily as needed for muscle spasms.     diclofenac sodium (VOLTAREN) 1 % GEL Apply 2 g topically 4 (four) times daily.    fish oil-omega-3 fatty acids 1000 MG capsule Take 2 g by mouth daily.    gabapentin (NEURONTIN) 400 MG capsule Take 1 capsule by mouth 4 times daily    glucose blood (ACCU-CHEK GUIDE) test strip Please use to test blood sugar 3 times daily as directed. DX: E11.9    Insulin Pen Needle (NOVOFINE) 32G X 6 MM MISC UAD to inject Victoza and Lantus daily. (Patient taking differently: UAD Lantus daily.)    LANTUS SOLOSTAR 100 UNIT/ML Solostar Pen INJECT 48 UNITS SUBCUTANEOUSLY ONCE DAILY AT 10 PM (Patient taking differently: 30 Units.)    lisinopril (ZESTRIL) 10 MG tablet Take 1 tablet by mouth once daily    omeprazole (PRILOSEC) 40 MG capsule Take 1 capsule by mouth in the morning and at bedtime.    pantoprazole (PROTONIX) 40 MG tablet Take 1 tablet (40 mg total) by mouth 2 (two) times daily before a meal.    sertraline (ZOLOFT) 100 MG tablet Take 100 mg by mouth 2 (two) times daily.     sucralfate (CARAFATE) 1 GM/10ML suspension Take 1 g by mouth 4 (four)  times daily. (Patient not taking: Reported on 05/21/2021) 05/21/2021: Hasn't picked up from pharmacy but was instructed today to pick it up   TRULICITY 3 WJ/1.9JY SOPN INJECT 1 DOSE SUBCUTANEOUSLY ONCE A WEEK    No facility-administered encounter medications on file as of 05/21/2021.    Patient Active Problem List   Diagnosis Date Noted   Hypertensive disorder 12/14/2018   Asthma 03/09/2017   Vitamin D deficiency 03/09/2017   Generalized anxiety disorder 05/23/2016   Gastroesophageal reflux disease 05/23/2016   Depressive disorder 05/23/2016   Fibromyalgia 05/23/2016   Family history of cardiac disorder  05/23/2016   Diabetic neuropathy ([place]) 12/19/2013   Neuropathy due to type 2 diabetes mellitus ([place]) 03/17/2013   Degeneration of lumbar intervertebral disc 01/04/2013   Chronic pain syndrome 01/04/2013   Arthropathy of lumbar facet joint 01/04/2013   Syncope 02/12/2011   Diabetes mellitus ([place]) 01/04/2011   Chronic pain 01/04/2011   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 01/04/2011   Hyperlipidemia associated with type 2 diabetes mellitus ([place]) 01/04/2011   Restless legs 01/04/2011   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[place]) 01/04/2011   Palpitations 01/04/2011    Conditions to be addressed/monitored:HTN, DMII, Anxiety, and Depression  Care Plan : [facility] Care Plan  Updates made by Ilean China, RN since 05/22/2021 12:00 AM     Problem: Chronic Disease Management Needs   Priority: Medium     Long-Range Goal: Work with [facility] Regarding Care Management and Care Coordination Needs Associated with Diabete, HTN, Anxiety, and Depression   This Visit's Progress: On track  Priority: Medium  Note:   Current Barriers:  Chronic Disease Management support and education needs related to HTN, DMII, Anxiety, and Depression  RNCM Clinical Goal(s):  Patient will work with RN Case Manager to address needs related to HTN, DMII, Anxiety, and Depression and Limited education about current disease process causing loss of appetite and discomfort* and grief over death of husband. take all medications exactly as prescribed and will call provider for medication related questions demonstrate ongoing self health care management ability through collaboration with RN Care manager, provider, and care team.   Interventions: 1:1 collaboration with primary care provider regarding development and update of comprehensive plan of care as evidenced by provider attestation and co-signature Inter-disciplinary care team collaboration (see longitudinal plan of care) Evaluation of  current treatment plan related to  self management and patient's adherence to plan as established by provider Provided with RNCM contact number and encouraged to reach out as needed   Diabetes:  (Status: Condition stable. Not addressed this visit.) Lab Results  Component Value Date   HGBA1C 6.9 02/19/2021  Assessed patient's understanding of A1c goal: <7% Reviewed and discussed medications and importance of adherence Assessed for adequate family/social support Discussed diet and appetite Discussed blood sugar management while sick Encouraged to continue checking blood sugar regularly and to call PCP with any readings outside of recommended range Reviewed upcoming appointments and procedures Patient has adequate transportation from family members Provided with RNCM contact number and encouraged to reach out as needed  Hypertension: (Status: Goal on track: YES.) Last practice recorded BP readings:  BP Readings from Last 3 Encounters:  02/19/21 125/86  01/22/21 134/81  01/15/21 (!) 142/80  Most recent eGFR/CrCl:  Lab Results  Component Value Date   EGFR 80 01/15/2021    No components found for: CRCL  Evaluation of current treatment plan related to hypertension self management and patient's adherence to plan as  established by provider;   Reviewed medications with patient and discussed importance of compliance;  Counseled on the importance of exercise goals with target of 150 minutes per week Discussed plans with patient for ongoing care management follow up and provided patient with direct contact information for care management team; Advised patient, providing education and rationale, to monitor blood pressure daily and record, calling PCP for findings outside established parameters;  Discussed complications of poorly controlled blood pressure such as heart disease, stroke, circulatory complications, vision complications, kidney impairment, sexual dysfunction;  Discussed that patient  feels lightheaded after eating and has to sit down for about an hour. She also feels like her heart is beating hard and fast. Discussed possibility of postprandial hypotension and encouraged to move carefully and change positions slowly when feeling like this.   Weight Loss/Nausea/Abd Pain/Decreased Appetite:  (Status: Goal on track: NO.) Evaluation of current treatment plan related to recurrent nausea and vomiting and patient's adherence to plan as established by provider. Chart reviewed including relevant office notes, lab results, and hospitalization note Reviewed and discussed medications and compliance Discussed colonoscopy and endoscopy Endoscopy relatively normal except for esophageal stricture. Esophageal dilation performed and patient is able to swallow food more easily now. Does not feel like it is getting stuck. 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 Recommended to repeat in 1 year. Gastric emptying study was normal.  Therapeutic listening utilized in response to anxiety/depression associated with current state of health and medical procedures Collaborated with PCP regarding differential diagnoses for abd pain, nauseas, increased heart rate, awareness of heart beat, lightheadedness, early satiety, and brain fog Discussed meals. Patient is eating 4 small meals a day. Appetite is decreased and she feels full quickly.  Reviewed and discussed medications and medication list was reconciled to accurate reflect what patient is taking.  Protonix was discontinued and patient was started on omeprazole BID. Carafate was sent to [facility] but patient was unaware. Advised patient that it should be available for pickup and that the directions are to take it 4 times a day. This may help with stomach "burning"  Encouraged patient to reach out to LCSW with any  psychosocial concerns  Reassessed family/social support Reviewed upcoming appointments Advised to seek appropriate medical attention for any new or worsening symptoms Encouraged to call PCP or GI with any new or worsening symptoms  Patient Goals/Self-Care Activities: Patient will self administer medications as prescribed Patient will attend all scheduled provider appointments Patient will continue to perform ADL's independently Patient will continue to perform IADL's independently Patient will call provider office for new concerns or questions       Plan:Telephone follow up appointment with care management team member scheduled for:  05/31/21 with RNCM and The patient has been provided with contact information for the care management team and has been advised to call with any health related questions or concerns.   Chong Sicilian, BSN, RN-BC Embedded Chronic Care Manager [facility] / [place] Management Direct Dial: [phone]

## 2021-05-23 ENCOUNTER — Telehealth: Payer: Medicare Other

## 2021-05-28 DIAGNOSIS — E119 Type 2 diabetes mellitus without complications: Secondary | ICD-10-CM | POA: Diagnosis not present

## 2021-05-28 DIAGNOSIS — Z0100 Encounter for examination of eyes and vision without abnormal findings: Secondary | ICD-10-CM | POA: Diagnosis not present

## 2021-05-31 ENCOUNTER — Ambulatory Visit: Payer: Medicare Other | Admitting: *Deleted

## 2021-05-31 DIAGNOSIS — E1159 Type 2 diabetes mellitus with other circulatory complications: Secondary | ICD-10-CM

## 2021-05-31 DIAGNOSIS — R1013 Epigastric pain: Secondary | ICD-10-CM

## 2021-05-31 DIAGNOSIS — F411 Generalized anxiety disorder: Secondary | ICD-10-CM

## 2021-05-31 DIAGNOSIS — I152 Hypertension secondary to endocrine disorders: Secondary | ICD-10-CM

## 2021-05-31 DIAGNOSIS — E1122 Type 2 diabetes mellitus with diabetic chronic kidney disease: Secondary | ICD-10-CM

## 2021-05-31 NOTE — Chronic Care Management (AMB) (Signed)
Chronic Care Management   CCM RN Visit Note  05/31/2021 Name: Casey Sanders MRN: [id number] DOB: 1961-04-25  Subjective: Casey Sanders is a 60 y.o. year old female who is a primary care patient of [name], DO. The care management team was consulted for assistance with disease management and care coordination needs.    Engaged with patient by telephone for follow up visit in response to provider referral for case management and/or care coordination services.   Consent to Services:  The patient was given information about Chronic Care Management services, agreed to services, and gave verbal consent prior to initiation of services.  Please see initial visit note for detailed documentation.   Patient agreed to services and verbal consent obtained.   Assessment: Review of patient past medical history, allergies, medications, health status, including review of consultants reports, laboratory and other test data, was performed as part of comprehensive evaluation and provision of chronic care management services.   SDOH (Social Determinants of Health) assessments and interventions performed:    CCM Care Plan  Allergies  Allergen Reactions   Codeine Hives, Nausea And Vomiting and Other (See Comments)   Jardiance [Empagliflozin]     Caused blisters   Metformin And Related     nausea   Niaspan [Niacin Er] Other (See Comments)    Increase blood glucose    Outpatient Encounter Medications as of 05/31/2021  Medication Sig Note   Accu-Chek Softclix Lancets lancets Please use to test blood sugar 3 times daily as directed. DX: E11.9    albuterol (VENTOLIN HFA) 108 (90 Base) MCG/ACT inhaler Inhale 1-2 puffs into the lungs every 6 (six) hours as needed for wheezing or shortness of breath.    aspirin 81 MG EC tablet Take 81 mg by mouth daily.    atorvastatin (LIPITOR) 80 MG tablet TAKE 1 TABLET BY MOUTH ONCE DAILY AT  6  PM    Blood Glucose Monitoring Suppl (ACCU-CHEK GUIDE ME) w/Device  KIT Please use to test blood sugar 3 times daily as directed. DX: E11.9    clonazePAM (KLONOPIN) 2 MG tablet Take 2 mg by mouth 2 (two) times daily.    cyclobenzaprine (FLEXERIL) 10 MG tablet Take 10 mg by mouth 3 (three) times daily as needed for muscle spasms.     diclofenac sodium (VOLTAREN) 1 % GEL Apply 2 g topically 4 (four) times daily.    fish oil-omega-3 fatty acids 1000 MG capsule Take 2 g by mouth daily.    gabapentin (NEURONTIN) 400 MG capsule Take 1 capsule by mouth 4 times daily    glucose blood (ACCU-CHEK GUIDE) test strip Please use to test blood sugar 3 times daily as directed. DX: E11.9    Insulin Pen Needle (NOVOFINE) 32G X 6 MM MISC UAD to inject Victoza and Lantus daily. (Patient taking differently: UAD Lantus daily.)    LANTUS SOLOSTAR 100 UNIT/ML Solostar Pen INJECT 48 UNITS SUBCUTANEOUSLY ONCE DAILY AT 10 PM (Patient taking differently: 30 Units.)    lisinopril (ZESTRIL) 10 MG tablet Take 1 tablet by mouth once daily    omeprazole (PRILOSEC) 40 MG capsule Take 1 capsule by mouth in the morning and at bedtime.    pantoprazole (PROTONIX) 40 MG tablet Take 1 tablet (40 mg total) by mouth 2 (two) times daily before a meal.    sertraline (ZOLOFT) 100 MG tablet Take 100 mg by mouth 2 (two) times daily.     sucralfate (CARAFATE) 1 GM/10ML suspension Take 1 g by mouth 4 (four)  times daily. (Patient not taking: Reported on 05/21/2021) 05/21/2021: Hasn't picked up from pharmacy but was instructed today to pick it up   TRULICITY 3 KD/9.8PJ SOPN INJECT 1 DOSE SUBCUTANEOUSLY ONCE A WEEK    No facility-administered encounter medications on file as of 05/31/2021.    Patient Active Problem List   Diagnosis Date Noted   Hypertensive disorder 12/14/2018   Asthma 03/09/2017   Vitamin D deficiency 03/09/2017   Generalized anxiety disorder 05/23/2016   Gastroesophageal reflux disease 05/23/2016   Depressive disorder 05/23/2016   Fibromyalgia 05/23/2016   Family history of cardiac disorder  05/23/2016   Diabetic neuropathy ([place]) 12/19/2013   Neuropathy due to type 2 diabetes mellitus ([place]) 03/17/2013   Degeneration of lumbar intervertebral disc 01/04/2013   Chronic pain syndrome 01/04/2013   Arthropathy of lumbar facet joint 01/04/2013   Syncope 02/12/2011   Diabetes mellitus ([place]) 01/04/2011   Chronic pain 01/04/2011   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 01/04/2011   Hyperlipidemia associated with type 2 diabetes mellitus ([place]) 01/04/2011   Restless legs 01/04/2011   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[place]) 01/04/2011   Palpitations 01/04/2011    Conditions to be addressed/monitored:HTN, DMII, and recurrent nausea and vomitting  Care Plan : [facility] Care Plan  Updates made by Ilean China, RN since 05/31/2021 12:00 AM     Problem: Chronic Disease Management Needs   Priority: Medium     Long-Range Goal: Work with [facility] Regarding Care Management and Care Coordination Needs Associated with Diabete, HTN, Anxiety, and Depression   This Visit's Progress: On track  Recent Progress: On track  Priority: Medium  Note:   Current Barriers:  Chronic Disease Management support and education needs related to HTN, DMII, Anxiety, and Depression  RNCM Clinical Goal(s):  Patient will work with RN Case Manager to address needs related to HTN, DMII, Anxiety, and Depression and Limited education about current disease process causing loss of appetite and discomfort* and grief over death of husband. take all medications exactly as prescribed and will call provider for medication related questions demonstrate ongoing self health care management ability through collaboration with RN Care manager, provider, and care team.   Interventions: 1:1 collaboration with primary care provider regarding development and update of comprehensive plan of care as evidenced by provider attestation and co-signature Inter-disciplinary care team collaboration (see  longitudinal plan of care) Evaluation of current treatment plan related to  self management and patient's adherence to plan as established by provider Provided with RNCM contact number and encouraged to reach out as needed Collaborated with [facility] front office staff  to schedule a PCP f/u in September   Diabetes:  (Status: Condition stable. Not addressed this visit.) Lab Results  Component Value Date   HGBA1C 6.9 02/19/2021  Assessed patient's understanding of A1c goal: <7% Reviewed and discussed medications and importance of adherence Assessed for adequate family/social support Discussed diet and appetite Discussed blood sugar management while sick Encouraged to continue checking blood sugar regularly and to call PCP with any readings outside of recommended range Reviewed upcoming appointments and procedures Patient has adequate transportation from family members Provided with RNCM contact number and encouraged to reach out as needed  Hypertension: (Status: Condition stable. Not addressed this visit.) Last practice recorded BP readings:  BP Readings from Last 3 Encounters:  02/19/21 125/86  01/22/21 134/81  01/15/21 (!) 142/80  Most recent eGFR/CrCl:  Lab Results  Component Value Date   EGFR 80 01/15/2021  No components found for: CRCL  Evaluation of current treatment plan related to hypertension self management and patient's adherence to plan as established by provider;   Reviewed medications with patient and discussed importance of compliance;  Counseled on the importance of exercise goals with target of 150 minutes per week Discussed plans with patient for ongoing care management follow up and provided patient with direct contact information for care management team; Advised patient, providing education and rationale, to monitor blood pressure daily and record, calling PCP for findings outside established parameters;  Discussed complications of poorly controlled blood  pressure such as heart disease, stroke, circulatory complications, vision complications, kidney impairment, sexual dysfunction;  Discussed that patient feels lightheaded after eating and has to sit down for about an hour. She also feels like her heart is beating hard and fast. Discussed possibility of postprandial hypotension and encouraged to move carefully and change positions slowly when feeling like this.   Weight Loss/Nausea/Abd Pain/Decreased Appetite:  (Status: Goal on track: NO.) Evaluation of current treatment plan related to recurrent nausea and vomiting and patient's adherence to plan as established by provider. Chart reviewed including relevant office notes, lab results, and hospitalization note Reviewed and discussed medications and compliance Discussed colonoscopy and endoscopy Endoscopy relatively normal except for esophageal stricture. Esophageal dilation performed and patient is able to swallow food more easily now. Does not feel like it is getting stuck. Colonoscopy wasn't completed due to incomplete prep and was rescheduled for 8/5 with [facility]. Patient had to cancel due to the prep causing vomiting.  Reached out to [facility] to discuss rescheduling colonoscopy. Patient has an appt with them on 8/18 at 10:30. Reminded patient of appointment and advised that they will discuss options at that time.  Discussed meals. Patient is eating 4 small meals a day. Appetite is decreased and she feels full quickly.  Reviewed and discussed medications and medication list was reconciled to accurate reflect what patient is taking.  Protonix was discontinued and patient was started on omeprazole BID. Carafate was sent to [facility] but patient was unaware. Advised patient that it should be available for pickup and that the directions are to take it 4 times a day. This may help with stomach "burning". She has not picked this up yet and again encouraged patient to do so. Encouraged patient to reach  out to LCSW with any psychosocial concerns  Collaborated with LCSW regarding current state of health and rescheduling of colonoscopy Reassessed family/social support Reviewed upcoming appointments Advised to seek appropriate medical attention for any new or worsening symptoms Encouraged to call PCP or GI with any new or worsening symptoms  Patient Goals/Self-Care Activities: Patient will self administer medications as prescribed Patient will attend all scheduled provider appointments Patient will continue to perform ADL's independently Patient will continue to perform IADL's independently Patient will call provider office for new concerns or questions       Plan:Telephone follow up appointment with care management team member scheduled for:  06/13/21 with RNCM and The patient has been provided with contact information for the care management team and has been advised to call with any health related questions or concerns.   Chong Sicilian, BSN, RN-BC Embedded Chronic Care Manager [facility] [place] Management Direct Dial: [phone]

## 2021-05-31 NOTE — Patient Instructions (Signed)
Visit Information  PATIENT GOALS:  Goals Addressed             This Visit's Progress    Increase Knowledge of Health Condition and Protect Health   On track    Timeframe:  Short-Term Goal Priority:  High Progress: Not on Track Start Date:   02/26/21                          Expected End Date:  05/29/21          Follow-up: 06/13/21  Call PCP or GI with any new or worsening symptoms Continue to eat small, frequent meals and to drink pedialyte to replace electrolytes  Consider Premier Protein Clear (sugar free) as a nutritional supplement if unable to eat enough Talk with friends/family daily Get outside for at least a few minutes everyday Talk with LCSW regarding psychosocial issues Call RN Care Manager as needed [phone] Follow-up with GI on 06/13/21  Follow-up with PCP on 07/04/21        The patient verbalized understanding of instructions, educational materials, and care plan provided today and declined offer to receive copy of patient instructions, educational materials, and care plan.   Telephone follow up appointment with care management team member scheduled for: 06/13/21 with RNCM  Chong Sicilian, BSN, RN-BC [place] / [place] Management Direct Dial: [phone]

## 2021-06-07 ENCOUNTER — Other Ambulatory Visit: Payer: Self-pay | Admitting: Gastroenterology

## 2021-06-07 DIAGNOSIS — R1310 Dysphagia, unspecified: Secondary | ICD-10-CM | POA: Diagnosis not present

## 2021-06-07 DIAGNOSIS — K219 Gastro-esophageal reflux disease without esophagitis: Secondary | ICD-10-CM | POA: Diagnosis not present

## 2021-06-07 DIAGNOSIS — K5900 Constipation, unspecified: Secondary | ICD-10-CM | POA: Diagnosis not present

## 2021-06-07 DIAGNOSIS — R002 Palpitations: Secondary | ICD-10-CM | POA: Diagnosis not present

## 2021-06-07 DIAGNOSIS — R42 Dizziness and giddiness: Secondary | ICD-10-CM | POA: Diagnosis not present

## 2021-06-07 DIAGNOSIS — R112 Nausea with vomiting, unspecified: Secondary | ICD-10-CM | POA: Diagnosis not present

## 2021-06-13 ENCOUNTER — Ambulatory Visit: Payer: Medicare Other | Admitting: *Deleted

## 2021-06-13 DIAGNOSIS — I152 Hypertension secondary to endocrine disorders: Secondary | ICD-10-CM

## 2021-06-13 DIAGNOSIS — R1013 Epigastric pain: Secondary | ICD-10-CM

## 2021-06-13 DIAGNOSIS — R112 Nausea with vomiting, unspecified: Secondary | ICD-10-CM

## 2021-06-13 DIAGNOSIS — E1122 Type 2 diabetes mellitus with diabetic chronic kidney disease: Secondary | ICD-10-CM

## 2021-06-13 DIAGNOSIS — N183 Chronic kidney disease, stage 3 unspecified: Secondary | ICD-10-CM

## 2021-06-13 DIAGNOSIS — E1159 Type 2 diabetes mellitus with other circulatory complications: Secondary | ICD-10-CM

## 2021-06-17 NOTE — Patient Instructions (Signed)
Visit Information  PATIENT GOALS:  Goals Addressed             This Visit's Progress    Increase Knowledge of Health Condition and Protect Health   On track    Timeframe:  Short-Term Goal Priority:  High Progress: Not on Track Start Date:   02/26/21                          Expected End Date:  05/29/21          Follow-up: 07/01/21  Call PCP or GI with any new or worsening symptoms Continue to eat small, frequent meals and to drink pedialyte to replace electrolytes  Consider Premier Protein Clear (sugar free) as a nutritional supplement if unable to eat enough Talk with friends/family daily Get outside for at least a few minutes everyday Talk with LCSW regarding psychosocial issues Call RN Care Manager as needed [phone] Colonoscopy on 06/19/21. Talk with GI if prep makes you sick Cardiology appointment on 06/27/21 Follow-up with PCP on 07/04/21     Monitor and Manage My Blood Sugar-Diabetes Type 2   On track    Timeframe:  Long-Range Goal Priority:  Medium Start Date:    02/07/21                         Expected End Date:   10/19/21                    Follow Up Date 07/01/21   Check and record blood sugar before meals and at bedtime Call PCP with any readings outside of recommended range Take medications as prescribed Keep all medical appointments Seek appropriate medical care for any new or worsening symptoms Call RN Care Manager as needed [phone]   Why is this important?   Checking your blood sugar at home helps to keep it from getting very high or very low.  Writing the results in a diary or log helps the doctor know how to care for you.  Your blood sugar log should have the time, date and the results.  Also, write down the amount of insulin or other medicine that you take.  Other information, like what you ate, exercise done and how you were feeling, will also be helpful.     Notes:         The patient verbalized understanding of instructions,  educational materials, and care plan provided today and declined offer to receive copy of patient instructions, educational materials, and care plan.   Plan:Telephone follow up appointment with care management team member scheduled for:  07/01/21 with RNCM and The patient has been provided with contact information for the care management team and has been advised to call with any health related questions or concerns.   Chong Sicilian, BSN, RN-BC Embedded Chronic Care Manager [facility] [place] [facility] Direct Dial: [phone]

## 2021-06-17 NOTE — Chronic Care Management (AMB) (Signed)
Chronic Care Management   CCM RN Visit Note  06/13/2021 Name: Casey Sanders MRN: [id number] DOB: [birth year]  Subjective: Casey Sanders is a 60 y.o. year old female who is a primary care patient of [name], DO. The care management team was consulted for assistance with disease management and care coordination needs.    Engaged with patient by telephone for follow up visit in response to provider referral for case management and/or care coordination services.   Consent to Services:  The patient was given information about Chronic Care Management services, agreed to services, and gave verbal consent prior to initiation of services.  Please see initial visit note for detailed documentation.   Patient agreed to services and verbal consent obtained.   Assessment: Review of patient past medical history, allergies, medications, health status, including review of consultants reports, laboratory and other test data, was performed as part of comprehensive evaluation and provision of chronic care management services.   SDOH (Social Determinants of Health) assessments and interventions performed:    CCM Care Plan  Allergies  Allergen Reactions   Codeine Hives, Nausea And Vomiting and Other (See Comments)   Jardiance [Empagliflozin]     Caused blisters   Metformin And Related     nausea   Niaspan [Niacin Er] Other (See Comments)    Increase blood glucose    Outpatient Encounter Medications as of 06/13/2021  Medication Sig Note   Accu-Chek Softclix Lancets lancets Please use to test blood sugar 3 times daily as directed. DX: E11.9    albuterol (VENTOLIN HFA) 108 (90 Base) MCG/ACT inhaler Inhale 1-2 puffs into the lungs every 6 (six) hours as needed for wheezing or shortness of breath.    aspirin 81 MG EC tablet Take 81 mg by mouth daily.    atorvastatin (LIPITOR) 80 MG tablet TAKE 1 TABLET BY MOUTH ONCE DAILY AT  6  PM    Blood Glucose Monitoring Suppl (ACCU-CHEK GUIDE ME) w/Device  KIT Please use to test blood sugar 3 times daily as directed. DX: E11.9    clonazePAM (KLONOPIN) 2 MG tablet Take 2 mg by mouth 2 (two) times daily.    cyclobenzaprine (FLEXERIL) 10 MG tablet Take 10 mg by mouth 3 (three) times daily as needed for muscle spasms.     diclofenac sodium (VOLTAREN) 1 % GEL Apply 2 g topically 4 (four) times daily.    fish oil-omega-3 fatty acids 1000 MG capsule Take 2 g by mouth daily.    gabapentin (NEURONTIN) 400 MG capsule Take 1 capsule by mouth 4 times daily    glucose blood (ACCU-CHEK GUIDE) test strip Please use to test blood sugar 3 times daily as directed. DX: E11.9    Insulin Pen Needle (NOVOFINE) 32G X 6 MM MISC UAD to inject Victoza and Lantus daily. (Patient taking differently: UAD Lantus daily.)    LANTUS SOLOSTAR 100 UNIT/ML Solostar Pen INJECT 48 UNITS SUBCUTANEOUSLY ONCE DAILY AT 10 PM (Patient taking differently: 30 Units.)    lisinopril (ZESTRIL) 10 MG tablet Take 1 tablet by mouth once daily    omeprazole (PRILOSEC) 40 MG capsule Take 1 capsule by mouth in the morning and at bedtime.    pantoprazole (PROTONIX) 40 MG tablet Take 1 tablet (40 mg total) by mouth 2 (two) times daily before a meal.    sertraline (ZOLOFT) 100 MG tablet Take 100 mg by mouth 2 (two) times daily.     sucralfate (CARAFATE) 1 GM/10ML suspension Take 1 g by mouth 4 (four)  times daily. (Patient not taking: Reported on 05/21/2021) 05/21/2021: Hasn't picked up from pharmacy but was instructed today to pick it up   TRULICITY 3 YQ/6.5HQ SOPN INJECT 1 DOSE SUBCUTANEOUSLY ONCE A WEEK    No facility-administered encounter medications on file as of 06/13/2021.    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/04/2013   Syncope 02/12/2011   Diabetes mellitus ([place]) 01/04/2011   Chronic pain 01/04/2011   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 01/04/2011   Hyperlipidemia associated with type 2 diabetes mellitus ([place]) 01/04/2011   Restless legs 01/04/2011   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   Palpitations 01/04/2011    Conditions to be addressed/monitored:HTN, DMII, and recurrent N/V  Care Plan : RNCM Care Plan   Problem: Chronic Disease Management Needs   Priority: Medium     Long-Range Goal: Work with [facility] Regarding Care Management and Care Coordination Needs Associated with Diabetes, HTN, Anxiety, and Depression   This Visit's Progress: On track  Recent Progress: On track  Priority: Medium  Note:   Current Barriers:  Chronic Disease Management support and education needs related to HTN, DMII, Anxiety, and Depression  RNCM Clinical Goal(s):  Patient will work with RN Case Manager to address needs related to HTN, DMII, Anxiety, and Depression and Limited education about current disease process causing loss of appetite and discomfort* and grief over death of husband. take all medications exactly as prescribed and will call provider for medication related questions demonstrate ongoing self health care management ability through collaboration with RN Care manager, provider, and care team.   Interventions: 1:1 collaboration with primary care provider regarding development and update of comprehensive plan of care as evidenced by provider attestation and co-signature Inter-disciplinary care team collaboration (see longitudinal plan of care) Evaluation of current treatment plan related to  self  management and patient's adherence to plan as established by provider Provided with RNCM contact number and encouraged to reach out as needed   Diabetes:  (Status: Goal on track: YES.) Lab Results  Component Value Date   HGBA1C 6.9 02/19/2021  Assessed patient's understanding of A1c goal: <7% Reviewed and discussed medications and importance of adherence Assessed for adequate family/social support Discussed diet and appetite Can only eat small amounts at a time due to nausea/vomiting Eating about 4 small meals a day Discussed blood sugar management while sick Encouraged to continue checking blood sugar regularly and to call PCP with any readings outside of recommended range Provided with RNCM contact number and encouraged to reach out as needed  Hypertension: (Status: Goal on track: YES.) Last practice recorded BP readings:  BP Readings from Last 3 Encounters:  02/19/21 125/86  01/22/21 134/81  01/15/21 (!) 142/80   Most recent eGFR/CrCl:  Lab Results  Component Value Date   EGFR 80 01/15/2021    No components found for: CRCL  Evaluation of current treatment plan related to hypertension self management and patient's adherence to plan as established by provider;  Reinforced need to check blood pressure at least 3 times per week and to notify PCP of any readings outside of recommended range Discussed that she does not have a home blood pressure monitor. Reviewed insurance and advised patient that she may be able to order a home blood pressure monitor through her [employer]. Patient reports having received one but she isn't sure where it is right now. Researched online for the catalog or information regarding the catalog to pass on to patient. May be able to order through one of these two options if she can't find her catalog: 1. [email or web address] or 2. [phone] microlife BP wrist monitor Code H78, [place] [id number] Discussed that patient continues to feel  lightheaded after eating and has to sit down for about an hour. She also feels like her heart is beating hard and fast. Discussed during telephone call the possibility of postprandial hypotension and encouraged to move carefully and change positions slowly when feeling like this.  Reviewed upcoming appointments and discussed new patient appointment with cardiologist on 06/27/21. Patient was referred by GI for palpitations.  Confirmed that patient has transportation to this appointment   Weight Loss/Nausea/Abd Pain/Decreased Appetite:  (Status: Goal on track: NO.) Evaluation of current treatment plan related to recurrent nausea and vomiting and patient's adherence to plan as established by provider. Chart reviewed including relevant office notes, lab results, and hospitalization note Reviewed and discussed medications and compliance Discussed colonoscopy and endoscopy Endoscopy relatively normal except for esophageal stricture. Esophageal dilation performed and patient is able to swallow food more easily now. Does not feel like it is getting stuck. Colonoscopy wasn't completed due to incomplete prep and was rescheduled for 8/5 with [facility]. Patient had to cancel due to the prep causing vomiting.  Colonoscopy rescheduled for 06/19/21. Patient had a visit with [facility] on 06/06/21. Prep may be the same as last time.  Discussed meals. Patient is eating 4 small meals a day. Appetite is decreased and she feels full quickly.  Discussed order for esophageal manometry by GI on 06/06/21. This will be completed within the next couple of weeks.  Reassessed family/social support Reviewed upcoming appointments Advised to seek appropriate medical attention for any new or worsening symptoms Encouraged to call PCP or GI with any new or worsening symptoms  Patient Goals/Self-Care Activities: Patient will self administer medications as prescribed Patient will attend all scheduled provider appointments Patient will  continue to perform ADL's independently Patient will continue to perform IADL's independently Patient will call provider office for new concerns or questions     Plan:Telephone follow up appointment with care management team member scheduled for:  07/01/21 with RNCM and The patient has been provided with contact information for the care management team and has been advised to call with any health related questions or concerns.   Chong Sicilian, BSN, RN-BC Embedded Chronic Care Manager [facility] [place] [facility] Direct Dial: [phone]

## 2021-06-19 ENCOUNTER — Ambulatory Visit (HOSPITAL_COMMUNITY)
Admission: RE | Admit: 2021-06-19 | Discharge: 2021-06-19 | Disposition: A | Discharge status: Home / Self Care | Payer: Medicare Other | Source: Ambulatory Visit | Attending: Gastroenterology | Admitting: Gastroenterology

## 2021-06-19 ENCOUNTER — Encounter (HOSPITAL_COMMUNITY)
Admission: RE | Disposition: A | Discharge status: Home / Self Care | Payer: Self-pay | Source: Ambulatory Visit | Attending: Gastroenterology

## 2021-06-19 DIAGNOSIS — E1122 Type 2 diabetes mellitus with diabetic chronic kidney disease: Secondary | ICD-10-CM | POA: Diagnosis not present

## 2021-06-19 DIAGNOSIS — E1159 Type 2 diabetes mellitus with other circulatory complications: Secondary | ICD-10-CM

## 2021-06-19 DIAGNOSIS — R1110 Vomiting, unspecified: Secondary | ICD-10-CM | POA: Diagnosis present

## 2021-06-19 DIAGNOSIS — R12 Heartburn: Secondary | ICD-10-CM | POA: Diagnosis present

## 2021-06-19 DIAGNOSIS — N183 Chronic kidney disease, stage 3 unspecified: Secondary | ICD-10-CM

## 2021-06-19 DIAGNOSIS — K222 Esophageal obstruction: Secondary | ICD-10-CM | POA: Diagnosis not present

## 2021-06-19 DIAGNOSIS — I152 Hypertension secondary to endocrine disorders: Secondary | ICD-10-CM | POA: Diagnosis not present

## 2021-06-19 HISTORY — PX: ESOPHAGEAL MANOMETRY: SHX5429

## 2021-06-19 SURGERY — MANOMETRY, ESOPHAGUS

## 2021-06-19 MED ORDER — LIDOCAINE VISCOUS HCL 2 % MT SOLN
OROMUCOSAL | Status: AC
  Filled 2021-06-19: qty 15

## 2021-06-19 SURGICAL SUPPLY — 2 items
FACESHIELD LNG OPTICON STERILE (SAFETY) IMPLANT
GLOVE BIO SURGEON STRL SZ8 (GLOVE) ×4 IMPLANT

## 2021-06-19 NOTE — Progress Notes (Signed)
Esophageal Manometry done per protocol. Patient tolerated well without distress or complication.  

## 2021-06-20 DIAGNOSIS — R1310 Dysphagia, unspecified: Secondary | ICD-10-CM | POA: Diagnosis not present

## 2021-06-20 DIAGNOSIS — K219 Gastro-esophageal reflux disease without esophagitis: Secondary | ICD-10-CM | POA: Diagnosis not present

## 2021-06-21 ENCOUNTER — Encounter (HOSPITAL_COMMUNITY): Payer: Self-pay | Admitting: Gastroenterology

## 2021-06-27 ENCOUNTER — Encounter: Payer: Self-pay | Admitting: Cardiology

## 2021-06-27 ENCOUNTER — Other Ambulatory Visit: Payer: Self-pay

## 2021-06-27 ENCOUNTER — Ambulatory Visit: Payer: Medicare Other | Admitting: Cardiology

## 2021-06-27 VITALS — BP 133/84 | HR 72 | Temp 97.6°F | Resp 17 | Ht 61.0 in | Wt 150.4 lb

## 2021-06-27 DIAGNOSIS — E785 Hyperlipidemia, unspecified: Secondary | ICD-10-CM

## 2021-06-27 DIAGNOSIS — R002 Palpitations: Secondary | ICD-10-CM

## 2021-06-27 DIAGNOSIS — I10 Essential (primary) hypertension: Secondary | ICD-10-CM

## 2021-06-27 DIAGNOSIS — I700 Atherosclerosis of aorta: Secondary | ICD-10-CM | POA: Diagnosis not present

## 2021-06-27 DIAGNOSIS — E1169 Type 2 diabetes mellitus with other specified complication: Secondary | ICD-10-CM

## 2021-06-27 DIAGNOSIS — Z794 Long term (current) use of insulin: Secondary | ICD-10-CM

## 2021-06-27 DIAGNOSIS — E1165 Type 2 diabetes mellitus with hyperglycemia: Secondary | ICD-10-CM

## 2021-06-27 DIAGNOSIS — K219 Gastro-esophageal reflux disease without esophagitis: Secondary | ICD-10-CM | POA: Diagnosis not present

## 2021-06-27 NOTE — Progress Notes (Signed)
Date:  06/27/2021   ID:  Casey Sanders, DOB 09/21/1961, MRN [id number]  PCP:  [name], DO  Cardiologist:  Rex Kras, DO, [facility] (established care 06/27/2021) Former Cardiology Providers: Dr. Kirk Ruths  REASON FOR CONSULT: Palpitations  REQUESTING PHYSICIAN:  [name], DO [place],  [place] [address]  Chief Complaint  Patient presents with   New Patient (Initial Visit)   Palpitations    HPI  Casey Sanders is a 60 y.o. female who presents to the office with a chief complaint of " palpitations." Patient's past medical history and cardiovascular risk factors include: Hypertension, insulin-dependent diabetes mellitus type 2, hyperlipidemia, GERD, aortic atherosclerosis.  She is referred to the office at the request of [name], DO for evaluation of palpitations.  Palpitations: Patient states that she has had palpitations for last 1 year.  However, over the last 6 months the symptoms have been more noticeable.  She is currently getting worked up for dysphagia and has undergone multiple testing by her other providers.  The palpitations usually occur randomly, worse with stressful situations and walking, no improving factors, self-limiting.  These episodes last for a few minutes, no associated syncopal event.  Associated symptoms do include shortness of breath and near syncope.  Other contributing factors that may be associated to her palpitations include dehydration and poor oral intake due to dysphagia, possibility of hypoglycemia in the setting of poor oral intake given her diabetes, she denies any history of anemia.  No excessive use of caffeinated beverages.  Denies use of herbal supplements, energy drinks, stimulants, alcohol.  No prior history of thyroid disease.  No recent TSH levels for review.  Family history of premature coronary artery disease: Per patient she has 4 brothers who all have had heart disease younger than 62 years of  age.  FUNCTIONAL STATUS: No structured exercise program or daily routine.    ALLERGIES: Allergies  Allergen Reactions   Codeine Hives, Nausea And Vomiting and Other (See Comments)   Jardiance [Empagliflozin]     Caused blisters   Metformin And Related     nausea   Niaspan [Niacin Er] Other (See Comments)    Increase blood glucose    MEDICATION LIST PRIOR TO VISIT: Current Meds  Medication Sig   Accu-Chek Softclix Lancets lancets Please use to test blood sugar 3 times daily as directed. DX: E11.9   albuterol (VENTOLIN HFA) 108 (90 Base) MCG/ACT inhaler Inhale 1-2 puffs into the lungs every 6 (six) hours as needed for wheezing or shortness of breath.   aspirin 81 MG EC tablet Take 81 mg by mouth daily.   atorvastatin (LIPITOR) 80 MG tablet TAKE 1 TABLET BY MOUTH ONCE DAILY AT  6  PM   Blood Glucose Monitoring Suppl (ACCU-CHEK GUIDE ME) w/Device KIT Please use to test blood sugar 3 times daily as directed. DX: E11.9   diclofenac sodium (VOLTAREN) 1 % GEL Apply 2 g topically 4 (four) times daily.   fish oil-omega-3 fatty acids 1000 MG capsule Take 2 g by mouth daily.   glucose blood (ACCU-CHEK GUIDE) test strip Please use to test blood sugar 3 times daily as directed. DX: E11.9   LANTUS SOLOSTAR 100 UNIT/ML Solostar Pen INJECT 48 UNITS SUBCUTANEOUSLY ONCE DAILY AT 10 PM (Patient taking differently: 30 Units.)   lisinopril (ZESTRIL) 10 MG tablet Take 1 tablet by mouth once daily   sucralfate (CARAFATE) 1 GM/10ML suspension Take 1 g by mouth 4 (four) times daily.  TRULICITY 3 ON/6.2XB SOPN INJECT 1 DOSE SUBCUTANEOUSLY ONCE A WEEK     PAST MEDICAL HISTORY: Past Medical History:  Diagnosis Date   Allergic rhinitis    Anxiety and depression    Asthma    Benign essential HTN 01/04/2011   Chest pain    a. Myoview 9/05: no ischemia, no scar, EF 70%   Chest pain, unspecified 02/12/2011   Cystitis 02/08/2015   Diverticulosis    DM2 (diabetes mellitus, type 2) ([place])    Extremity pain  01/04/2013   Fibromyalgia    Frequent UTI    GERD (gastroesophageal reflux disease)    Hiatal Hernia   HLD (hyperlipidemia)    HTN (hypertension)    Left rotator cuff tear    Low back pain 01/04/2013   Lumbar disc disease    Nephrolithiasis    Obesity    Palpitations    monitor 9/05: NSR, sinus tachy, PVCs   Pre-operative cardiovascular examination 02/12/2011   Rectal bleeding 08/22/2013   Sepsis secondary to UTI ([place]) 08/09/2017   uterine ca dx'd 1988   per pt    PAST SURGICAL HISTORY: Past Surgical History:  Procedure Laterality Date   ABDOMINAL HYSTERECTOMY     CESAREAN SECTION     ESOPHAGEAL MANOMETRY N/A 06/19/2021   Procedure: ESOPHAGEAL MANOMETRY (EM);  Surgeon: Wilford Corner, MD;  Location: [facility];  Service: Endoscopy;  Laterality: N/A;   FOOT SURGERY     right wrist surgery     ROTATOR CUFF REPAIR Left 2009   tonsillectomy     TOTAL ABDOMINAL HYSTERECTOMY W/ BILATERAL SALPINGOOPHORECTOMY      FAMILY HISTORY: The patient family history includes Coronary artery disease in her father and mother; Heart attack (age of onset: 21) in her brother; Heart disease in her brother, brother, and brother; Lung cancer (age of onset: 71) in her brother.  SOCIAL HISTORY:  The patient  reports that she has never smoked. She has never used smokeless tobacco. She reports that she does not drink alcohol and does not use drugs.  REVIEW OF SYSTEMS: Review of Systems  Constitutional: Negative for chills and fever.  HENT:  Negative for hoarse voice and nosebleeds.   Eyes:  Negative for discharge, double vision and pain.  Cardiovascular:  Positive for palpitations. Negative for chest pain, claudication, dyspnea on exertion, leg swelling, near-syncope, orthopnea, paroxysmal nocturnal dyspnea and syncope.  Respiratory:  Negative for hemoptysis and shortness of breath.   Musculoskeletal:  Negative for muscle cramps and myalgias.  Gastrointestinal:  Positive for dysphagia, nausea and  vomiting. Negative for abdominal pain, constipation, diarrhea, hematemesis, hematochezia and melena.  Neurological:  Positive for dizziness and light-headedness.   PHYSICAL EXAM: Vitals with BMI 06/27/2021 06/27/2021 02/19/2021  Height - '5\' 1"'  '5\' 1"'   Weight - 150 lbs 6 oz 160 lbs  BMI - 28.41 32.44  Systolic 010 272 536  Diastolic 84 85 86  Pulse 72 86 100    CONSTITUTIONAL: Well-developed and well-nourished. No acute distress.  SKIN: Skin is warm and dry. No rash noted. No cyanosis. No pallor. No jaundice HEAD: Normocephalic and atraumatic.  EYES: No scleral icterus MOUTH/THROAT: Moist oral membranes.  NECK: No JVD present. No thyromegaly noted. No carotid bruits  LYMPHATIC: No visible cervical adenopathy.  CHEST Normal respiratory effort. No intercostal retractions  LUNGS: Clear to auscultation bilaterally.  No stridor. No wheezes. No rales.  CARDIOVASCULAR: Regular rate and rhythm, positive S1-S2, no murmurs rubs or gallops appreciated ABDOMINAL: Soft, nontender, nondistended, positive bowel sounds  in all 4 quadrants, no apparent ascites.  EXTREMITIES: No peripheral edema  HEMATOLOGIC: No significant bruising NEUROLOGIC: Oriented to person, place, and time. Nonfocal. Normal muscle tone.  PSYCHIATRIC: Normal mood and affect. Normal behavior. Cooperative  CARDIAC DATABASE: EKG: 06/27/2021: Normal sinus rhythm, 82 bpm, poor R wave progression, without underlying ischemia or injury pattern  Echocardiogram: No results found for this or any previous visit from the past 1095 days.  LABORATORY DATA: CBC Latest Ref Rng & Units 01/15/2021 01/27/2020 08/20/2017  WBC 3.4 - 10.8 x10E3/uL 8.8 7.1 6.5  Hemoglobin 11.1 - 15.9 g/dL 15.6 14.9 11.6  Hematocrit 34.0 - 46.6 % 44.8 44.6 34.7  Platelets 150 - 450 x10E3/uL 249 226 396(H)    CMP Latest Ref Rng & Units 02/19/2021 01/15/2021 11/28/2020  Glucose 65 - 99 mg/dL - 161(H) 148(H)  BUN 8 - 27 mg/dL - 12 11  Creatinine 0.57 - 1.00 mg/dL - 0.83 0.83   Sodium 134 - 144 mmol/L - 143 141  Potassium 3.5 - 5.2 mmol/L - 3.5 4.4  Chloride 96 - 106 mmol/L - 99 101  CO2 20 - 29 mmol/L - 19(L) 25  Calcium 8.7 - 10.3 mg/dL 10.5(H) 10.5(H) 10.4(H)  Total Protein 6.0 - 8.5 g/dL - 7.7 7.4  Total Bilirubin 0.0 - 1.2 mg/dL - 0.7 0.5  Alkaline Phos 44 - 121 IU/L - 57 56  AST 0 - 40 IU/L - 17 19  ALT 0 - 32 IU/L - 10 14    Lipid Panel  Lab Results  Component Value Date   CHOL 143 12/14/2018   HDL 50 12/14/2018   LDLCALC 67 12/14/2018   TRIG 132 12/14/2018   CHOLHDL 2.9 12/14/2018    No components found for: NTPROBNP No results for input(s): PROBNP in the last 8760 hours. Recent Labs    11/28/20 1409 01/15/21 1119  TSH 5.380* 4.460    BMP Recent Labs    11/28/20 1409 01/15/21 1119 02/19/21 0937  NA 141 143  --   K 4.4 3.5  --   CL 101 99  --   CO2 25 19*  --   GLUCOSE 148* 161*  --   BUN 11 12  --   CREATININE 0.83 0.83  --   CALCIUM 10.4* 10.5* 10.5*  GFRNONAA 76  --   --   GFRAA 88  --   --     HEMOGLOBIN A1C Lab Results  Component Value Date   HGBA1C 6.9 02/19/2021   MPG 166 (H) 07/07/2013    External Labs: 02/08/2021  Sodium 137, potassium 3.5, chloride 101, bicarb 28, BUN 12, creatinine 0.69. AST 18, ALT 16, alkaline phosphatase 42 14.2, hematocrit 40.2%   IMPRESSION:    ICD-10-CM   1. Palpitations  R00.2 LONG TERM MONITOR (3-14 DAYS)    TSH    2. Benign hypertension  I10 EKG 12-Lead    PCV ECHOCARDIOGRAM COMPLETE    3. Atherosclerosis of aorta (HCC)  I70.0 PCV ECHOCARDIOGRAM COMPLETE    4. Type 2 diabetes mellitus with hyperglycemia, with long-term current use of insulin (HCC)  E11.65 PCV ECHOCARDIOGRAM COMPLETE   Z79.4     5. Long-term insulin use (HCC)  Z79.4     6. Hyperlipidemia associated with type 2 diabetes mellitus (HCC)  E11.69    E78.5     7. Gastroesophageal reflux disease, unspecified whether esophagitis present  K21.9        RECOMMENDATIONS: Kalise Fickett is a 60 y.o. female  whose past medical history  and cardiac risk factors include: Hypertension, insulin-dependent diabetes mellitus type 2, hyperlipidemia, GERD, aortic atherosclerosis.  Palpitations No identifiable reversible cause. May be associated due to her dehydration, poor oral intake, stress/anxiety, questionable episodes of hypoglycemia given the fact that she has poor oral intake and continues to be on insulin. EKG nonischemic. Outside labs independently reviewed. Check TSH. 14-day extended Holter monitor to evaluate for dysrhythmias.  Benign hypertension Office blood pressures within acceptable range. Medications reconciled. Echocardiogram will be ordered to evaluate for structural heart disease and left ventricular systolic function.  Atherosclerosis of aorta (HCC) Continue aspirin and statin therapy.  Type 2 diabetes mellitus with hyperglycemia, with long-term current use of insulin (HCC) Patient's hemoglobin A1c has improved over the last several months most likely secondary to decreased oral intake due to dysphagia. Will defer management of her glycemia and insulin to her other providers.  Hyperlipidemia associated with type 2 diabetes mellitus ([place]) Currently on statin therapy. Denies myalgias. Patient has upcoming appointment with PCP and will have fasting lipid profile done.  I have asked her to bring a copy for review at the next visit.   FINAL MEDICATION LIST END OF ENCOUNTER: No orders of the defined types were placed in this encounter.   Medications Discontinued During This Encounter  Medication Reason   clonazePAM (KLONOPIN) 2 MG tablet Error   cyclobenzaprine (FLEXERIL) 10 MG tablet Error   gabapentin (NEURONTIN) 400 MG capsule Error   Insulin Pen Needle (NOVOFINE) 32G X 6 MM MISC Error   omeprazole (PRILOSEC) 40 MG capsule Error   pantoprazole (PROTONIX) 40 MG tablet Error   sertraline (ZOLOFT) 100 MG tablet Error     Current Outpatient Medications:    Accu-Chek  Softclix Lancets lancets, Please use to test blood sugar 3 times daily as directed. DX: E11.9, Disp: 100 each, Rfl: 12   albuterol (VENTOLIN HFA) 108 (90 Base) MCG/ACT inhaler, Inhale 1-2 puffs into the lungs every 6 (six) hours as needed for wheezing or shortness of breath., Disp: 8 g, Rfl: 0   aspirin 81 MG EC tablet, Take 81 mg by mouth daily., Disp: , Rfl:    atorvastatin (LIPITOR) 80 MG tablet, TAKE 1 TABLET BY MOUTH ONCE DAILY AT  6  PM, Disp: 90 tablet, Rfl: 0   Blood Glucose Monitoring Suppl (ACCU-CHEK GUIDE ME) w/Device KIT, Please use to test blood sugar 3 times daily as directed. DX: E11.9, Disp: 1 kit, Rfl: 0   diclofenac sodium (VOLTAREN) 1 % GEL, Apply 2 g topically 4 (four) times daily., Disp: 100 g, Rfl: 1   fish oil-omega-3 fatty acids 1000 MG capsule, Take 2 g by mouth daily., Disp: , Rfl:    glucose blood (ACCU-CHEK GUIDE) test strip, Please use to test blood sugar 3 times daily as directed. DX: E11.9, Disp: 100 each, Rfl: 12   LANTUS SOLOSTAR 100 UNIT/ML Solostar Pen, INJECT 48 UNITS SUBCUTANEOUSLY ONCE DAILY AT 10 PM (Patient taking differently: 30 Units.), Disp: 15 mL, Rfl: 0   lisinopril (ZESTRIL) 10 MG tablet, Take 1 tablet by mouth once daily, Disp: 90 tablet, Rfl: 0   sucralfate (CARAFATE) 1 GM/10ML suspension, Take 1 g by mouth 4 (four) times daily., Disp: , Rfl:    TRULICITY 3 OE/4.2PN SOPN, INJECT 1 DOSE SUBCUTANEOUSLY ONCE A WEEK, Disp: 12 mL, Rfl: 0  Orders Placed This Encounter  Procedures   TSH   LONG TERM MONITOR (3-14 DAYS)   EKG 12-Lead   PCV ECHOCARDIOGRAM COMPLETE    There are no Patient  Instructions on file for this visit.   --Continue cardiac medications as reconciled in final medication list. --Return in about 6 weeks (around 08/08/2021) for Follow up, Palpitations, Review test results. Or sooner if needed. --Continue follow-up with your primary care physician regarding the management of your other chronic comorbid conditions.  Patient's questions  and concerns were addressed to her satisfaction. She voices understanding of the instructions provided during this encounter.   This note was created using a voice recognition software as a result there may be grammatical errors inadvertently enclosed that do not reflect the nature of this encounter. Every attempt is made to correct such errors.  Rex Kras, [place], [facility]  Pager: [phone] Office: [phone]

## 2021-07-01 ENCOUNTER — Ambulatory Visit (INDEPENDENT_AMBULATORY_CARE_PROVIDER_SITE_OTHER): Payer: Medicare Other | Admitting: *Deleted

## 2021-07-01 DIAGNOSIS — R112 Nausea with vomiting, unspecified: Secondary | ICD-10-CM

## 2021-07-01 DIAGNOSIS — E1122 Type 2 diabetes mellitus with diabetic chronic kidney disease: Secondary | ICD-10-CM

## 2021-07-01 DIAGNOSIS — R1013 Epigastric pain: Secondary | ICD-10-CM

## 2021-07-01 DIAGNOSIS — E1159 Type 2 diabetes mellitus with other circulatory complications: Secondary | ICD-10-CM

## 2021-07-01 DIAGNOSIS — N183 Chronic kidney disease, stage 3 unspecified: Secondary | ICD-10-CM

## 2021-07-01 DIAGNOSIS — R002 Palpitations: Secondary | ICD-10-CM

## 2021-07-01 NOTE — Patient Instructions (Addendum)
Visit Information  PATIENT GOALS:  Goals Addressed             This Visit's Progress    Increase Knowledge of Health Condition and Protect Health   On track    Timeframe:  Short-Term Goal Priority:  High Progress: Not on Track Start Date:   02/26/21                          Expected End Date:  05/29/21          Follow-up: 07/31/21  Call PCP or GI with any new or worsening symptoms Continue to eat small, frequent meals and to drink pedialyte to replace electrolytes  Consider Premier Protein Clear (sugar free) as a nutritional supplement if unable to eat enough Talk with friends/family daily Get outside for at least a few minutes everyday Talk with LCSW regarding psychosocial issues Call RN Care Manager as needed [phone] Follow up with GI in October     Monitor and Manage Blood Pressure   Not on track    Timeframe:  Long-Range Goal Priority:  Medium Start Date:  07/01/21                           Expected End Date:                       Follow-up: 07/31/21  Purchase blood pressure machine Check and record blood pressure daily and PRN Call cardiologist or PCP with any readings outside of recommended range Keep appt with cardiologist and for cardiac testing Call RN Care Manager as needed [phone]      Monitor and Manage My Blood Sugar-Diabetes Type 2   Not on track    Timeframe:  Long-Range Goal Priority:  Medium Start Date:    02/07/21                         Expected End Date:   10/19/21                    Follow Up Date 07/31/21   Check and record blood sugar before meals and at bedtime Call PCP with any readings outside of recommended range Take medications as prescribed Talk with Dr Lajuana Ripple on 07/04/21 about blood sugar and medication management Try to eat meals at regular intervals Seek appropriate medical care for any new or worsening symptoms Call RN Care Manager as needed [phone]   Why is this important?   Checking your blood sugar at  home helps to keep it from getting very high or very low.  Writing the results in a diary or log helps the doctor know how to care for you.  Your blood sugar log should have the time, date and the results.  Also, write down the amount of insulin or other medicine that you take.  Other information, like what you ate, exercise done and how you were feeling, will also be helpful.     Notes:         The patient verbalized understanding of instructions, educational materials, and care plan provided today and declined offer to receive copy of patient instructions, educational materials, and care plan.    Plan:Telephone follow up appointment with care management team member scheduled for:  07/31/21 with RNCM and The patient has been provided with contact information for the care management team and  has been advised to call with any health related questions or concerns.   Chong Sicilian, BSN, RN-BC Embedded Chronic Care Manager [facility] [place] [facility] Direct Dial: [phone]

## 2021-07-01 NOTE — Chronic Care Management (AMB) (Signed)
Chronic Care Management   CCM RN Visit Note  07/01/2021 Name: Casey Sanders MRN: [id number] DOB: 1960/10/29  Subjective: Casey Sanders is a 60 y.o. year old female who is a primary care patient of Casey Norlander, DO. The care management team was consulted for assistance with disease management and care coordination needs.    Engaged with patient by telephone for follow up visit in response to provider referral for case management and/or care coordination services.   Consent to Services:  The patient was given information about Chronic Care Management services, agreed to services, and gave verbal consent prior to initiation of services.  Please see initial visit note for detailed documentation.   Patient agreed to services and verbal consent obtained.   Assessment: Review of patient past medical history, allergies, medications, health status, including review of consultants reports, laboratory and other test data, was performed as part of comprehensive evaluation and provision of chronic care management services.   SDOH (Social Determinants of Health) assessments and interventions performed:    CCM Care Plan  Allergies  Allergen Reactions   Codeine Hives, Nausea And Vomiting and Other (See Comments)   Jardiance [Empagliflozin]     Caused blisters   Metformin And Related     nausea   Niaspan [Niacin Er] Other (See Comments)    Increase blood glucose    Outpatient Encounter Medications as of 07/01/2021  Medication Sig Note   Accu-Chek Softclix Lancets lancets Please use to test blood sugar 3 times daily as directed. DX: E11.9    albuterol (VENTOLIN HFA) 108 (90 Base) MCG/ACT inhaler Inhale 1-2 puffs into the lungs every 6 (six) hours as needed for wheezing or shortness of breath.    aspirin 81 MG EC tablet Take 81 mg by mouth daily.    atorvastatin (LIPITOR) 80 MG tablet TAKE 1 TABLET BY MOUTH ONCE DAILY AT  6  PM    Blood Glucose Monitoring Suppl (ACCU-CHEK GUIDE ME) w/Device  KIT Please use to test blood sugar 3 times daily as directed. DX: E11.9    diclofenac sodium (VOLTAREN) 1 % GEL Apply 2 g topically 4 (four) times daily.    fish oil-omega-3 fatty acids 1000 MG capsule Take 2 g by mouth daily.    glucose blood (ACCU-CHEK GUIDE) test strip Please use to test blood sugar 3 times daily as directed. DX: E11.9    LANTUS SOLOSTAR 100 UNIT/ML Solostar Pen INJECT 48 UNITS SUBCUTANEOUSLY ONCE DAILY AT 10 PM (Patient taking differently: 30 Units.)    lisinopril (ZESTRIL) 10 MG tablet Take 1 tablet by mouth once daily    sucralfate (CARAFATE) 1 GM/10ML suspension Take 1 g by mouth 4 (four) times daily. 05/21/2021: Hasn't picked up from pharmacy but was instructed today to pick it up   TRULICITY 3 GX/2.1JH SOPN INJECT 1 DOSE SUBCUTANEOUSLY ONCE A WEEK    No facility-administered encounter medications on file as of 07/01/2021.    Patient Active Problem List   Diagnosis Date Noted   Hypertensive disorder 12/14/2018   Asthma 03/09/2017   Vitamin D deficiency 03/09/2017   Generalized anxiety disorder 05/23/2016   Gastroesophageal reflux disease 05/23/2016   Depressive disorder 05/23/2016   Fibromyalgia 05/23/2016   Family history of cardiac disorder 05/23/2016   Diabetic neuropathy ([place]) 12/19/2013   Neuropathy due to type 2 diabetes mellitus ([place]) 03/17/2013   Degeneration of lumbar intervertebral disc 01/04/2013   Chronic pain syndrome 01/04/2013   Arthropathy of lumbar facet joint 01/04/2013   Syncope 02/12/2011  Diabetes mellitus ([place]) 01/04/2011   Chronic pain 01/04/2011   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 01/04/2011   Hyperlipidemia associated with type 2 diabetes mellitus ([place]) 01/04/2011   Restless legs 01/04/2011   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[place]) 01/04/2011   Palpitations 01/04/2011    Conditions to be addressed/monitored:HTN, DMII, and N/V and weight loss  Care Plan : [place]  Updates made by  Ilean China, RN since 07/01/2021 12:00 AM     Problem: Chronic Disease Management Needs   Priority: Medium     Long-Range Goal: Work with [facility] Regarding Care Management and Care Coordination Needs Associated with Diabetes, HTN, Anxiety, and Depression   This Visit's Progress: On track  Recent Progress: On track  Priority: Medium  Note:   Current Barriers:  Chronic Disease Management support and education needs related to HTN, DMII, Anxiety, and Depression  RNCM Clinical Goal(s):  Patient will work with RN Case Manager to address needs related to HTN, DMII, Anxiety, and Depression and Limited education about current disease process causing loss of appetite and discomfort* and grief over death of husband. take all medications exactly as prescribed and will call provider for medication related questions demonstrate ongoing self health care management ability through collaboration with RN Care manager, provider, and care team.  Continue to work with specialists regarding nausea, vomiting, weight loss, and palpitations  Interventions: 1:1 collaboration with primary care provider regarding development and update of comprehensive plan of care as evidenced by provider attestation and co-signature Inter-disciplinary care team collaboration (see longitudinal plan of care) Evaluation of current treatment plan related to  self management and patient's adherence to plan as established by provider Provided with RNCM contact number and encouraged to reach out as needed   Diabetes:  (Status: Goal on track: NO.) Lab Results  Component Value Date   HGBA1C 6.9 02/19/2021   HGBA1C 7.4 (H) 11/28/2020   HGBA1C 9.7 (H) 07/17/2020   Lab Results  Component Value Date   [place] 65 12/14/2018   CREATININE 0.83 01/15/2021  Assessed patient's understanding of A1c goal: <7% Reviewed and discussed medications and importance of adherence Assessed for adequate family/social support Discussed diet and  appetite Can only eat small amounts at a time due to nausea/vomiting Eating about 4 small meals a day Discussed blood sugar management while sick Encouraged to continue checking blood sugar regularly and to call PCP with any readings outside of recommended range Discussed low blood sugar readings. Averaging between 80 and 129. Cardiology advised to not take Lantus since she isn't able to eat regularly. Blood sugar was 178 this morning after not taking Lantus yesterday.  Collaborated with front office staff to schedule an appt with Lottie Dawson, PharmD on 9/16 to discuss medication management Discussed importance of eating meals at regular intervals while taking on a long acting insulin Provided with RNCM contact number and encouraged to reach out as needed  Hypertension: (Status: Goal on track: YES.) Last practice recorded BP readings:  BP Readings from Last 3 Encounters:  06/27/21 133/84  02/19/21 125/86  01/22/21 134/81  Evaluation of current treatment plan related to hypertension self management and patient's adherence to plan as established by provider;   Reinforced need to check blood pressure at least 3 times per week and to notify PCP of any readings outside of recommended range Discussed that she does not have a home blood pressure monitor. Previously discussed that she may be able to get it through the  [employer] catalog.  Discussed local purchase options for a blood pressure monitor. [facility] in [place] has on for about $28. Recommended the arm cuff instead of the wrist cuff.  Discussed that patient continues to feel lightheaded after eating and has to sit down for about an hour. She also feels like her heart is beating hard and fast. Discussed during telephone call the possibility of postprandial hypotension and encouraged to move carefully and change positions slowly when feeling like this.  Discussed recent cardiology appointment and future testing. Scheduled for echo and heart monitor at  the end of the month.  Encouraged patient check and record blood pressure daily and PRN and to call PCP or cardiologist with any readings outside of recommended range Encouraged to keep all medical appointments  Confirmed that patient has transportation to this appointment   Weight Loss/Nausea/Abd Pain/Decreased Appetite:  (Status: Goal on track: NO.) Evaluation of current treatment plan related to recurrent nausea and vomiting and patient's adherence to plan as established by provider. Chart reviewed including relevant office notes, lab results, and hospitalization note Reviewed and discussed medications and compliance Discussed esophageal manometry testing and results Abnormal study Has a follow up in Oct with [facility] to discuss results and treatment  Discussed meals. Patient is still eating 4 small meals a day. Appetite is decreased and she feels full quickly.  Discussed symptoms Still having esophageal and stomach burning Taking medications as prescribed Encouraged patient to reach out to GI or seek appropriate medical attention as needed Reassessed family/social support Reviewed upcoming appointments Advised to seek appropriate medical attention for any new or worsening symptoms Encouraged to call PCP or GI with any new or worsening symptoms  Patient Goals/Self-Care Activities: Patient will self administer medications as prescribed Patient will attend all scheduled provider appointments Patient will continue to perform ADL's independently Patient will continue to perform IADL's independently Patient will call provider office for new concerns or questions       Plan:Telephone follow up appointment with care management team member scheduled for:  07/31/21 with RNCM and The patient has been provided with contact information for the care management team and has been advised to call with any health related questions or concerns.   Chong Sicilian, BSN, RN-BC Embedded Chronic Care  Manager [facility] / [place] Management Direct Dial: [phone]

## 2021-07-02 ENCOUNTER — Telehealth: Payer: Medicare Other

## 2021-07-04 ENCOUNTER — Ambulatory Visit (INDEPENDENT_AMBULATORY_CARE_PROVIDER_SITE_OTHER): Payer: Medicare Other | Admitting: Family Medicine

## 2021-07-04 ENCOUNTER — Encounter: Payer: Self-pay | Admitting: Family Medicine

## 2021-07-04 ENCOUNTER — Other Ambulatory Visit: Payer: Self-pay

## 2021-07-04 ENCOUNTER — Other Ambulatory Visit: Payer: Medicare Other

## 2021-07-04 ENCOUNTER — Ambulatory Visit: Payer: Medicare Other | Admitting: Family Medicine

## 2021-07-04 VITALS — BP 138/89 | HR 80 | Temp 97.6°F | Ht 61.0 in | Wt 147.2 lb

## 2021-07-04 DIAGNOSIS — E785 Hyperlipidemia, unspecified: Secondary | ICD-10-CM

## 2021-07-04 DIAGNOSIS — I152 Hypertension secondary to endocrine disorders: Secondary | ICD-10-CM

## 2021-07-04 DIAGNOSIS — N183 Chronic kidney disease, stage 3 unspecified: Secondary | ICD-10-CM

## 2021-07-04 DIAGNOSIS — E1169 Type 2 diabetes mellitus with other specified complication: Secondary | ICD-10-CM

## 2021-07-04 DIAGNOSIS — E1122 Type 2 diabetes mellitus with diabetic chronic kidney disease: Secondary | ICD-10-CM

## 2021-07-04 DIAGNOSIS — E1159 Type 2 diabetes mellitus with other circulatory complications: Secondary | ICD-10-CM | POA: Diagnosis not present

## 2021-07-04 DIAGNOSIS — E039 Hypothyroidism, unspecified: Secondary | ICD-10-CM

## 2021-07-04 LAB — BAYER DCA HB A1C WAIVED: HB A1C (BAYER DCA - WAIVED): 6.1 % — ABNORMAL HIGH (ref 4.8–5.6)

## 2021-07-04 NOTE — Progress Notes (Signed)
 Subjective: CC:DM PCP: Gottschalk, Ashly M, DO HPI:Casey Sanders is a 60 y.o. female presenting to clinic today for:  1. Type 2 Diabetes with hypertension, hyperlipidemia:  Reports that blood sugars at home are typically running between 80 and 120.  She has been off of the Trulicity for over 2 months now but her nausea and vomiting have not changed at all.  She had manometry done which was abnormal.  She will be seeing GI in about 2 weeks to discuss neck steps.  She continues to have vomiting about every other day.  She sometimes will inject her Lantus only 30 units though.  She does not do this if blood sugar is low.  Compliant with Lipitor, lisinopril  Last eye exam: needs Last foot exam: UTD Last A1c:  Lab Results  Component Value Date   HGBA1C 6.9 02/19/2021   Nephropathy screen indicated?: UTD Last flu, zoster and/or pneumovax:  Immunization History  Administered Date(s) Administered   Influenza Whole 10/20/2006   Influenza,inj,Quad PF,6+ Mos 08/22/2013, 09/07/2014, 07/15/2016, 08/20/2017, 07/05/2019   Moderna Sars-Covid-2 Vaccination 03/28/2020, 04/26/2020   Pneumococcal Conjugate-13 09/07/2014   Pneumococcal Polysaccharide-23 07/15/2016   Td 10/20/2002    ROS: No chest pain or shortness of breath.  She has generalized weakness  2. Hypothyroidism Generalized weakness, fatigue.  Last thyroid labs in March were normal   ROS: Per HPI  Allergies  Allergen Reactions   Codeine Hives, Nausea And Vomiting and Other (See Comments)   Jardiance [Empagliflozin]     Caused blisters   Metformin And Related     nausea   Niaspan [Niacin Er] Other (See Comments)    Increase blood glucose   Past Medical History:  Diagnosis Date   Allergic rhinitis    Anxiety and depression    Asthma    Benign essential HTN 01/04/2011   Chest pain    a. Myoview 9/05: no ischemia, no scar, EF 70%   Chest pain, unspecified 02/12/2011   Cystitis 02/08/2015   Diverticulosis    DM2 (diabetes  mellitus, type 2) (HCC)    Extremity pain 01/04/2013   Fibromyalgia    Frequent UTI    GERD (gastroesophageal reflux disease)    Hiatal Hernia   HLD (hyperlipidemia)    HTN (hypertension)    Left rotator cuff tear    Low back pain 01/04/2013   Lumbar disc disease    Nephrolithiasis    Obesity    Palpitations    monitor 9/05: NSR, sinus tachy, PVCs   Pre-operative cardiovascular examination 02/12/2011   Rectal bleeding 08/22/2013   Sepsis secondary to UTI (HCC) 08/09/2017   uterine ca dx'd 1988   per pt    Current Outpatient Medications:    Accu-Chek Softclix Lancets lancets, Please use to test blood sugar 3 times daily as directed. DX: E11.9, Disp: 100 each, Rfl: 12   albuterol (VENTOLIN HFA) 108 (90 Base) MCG/ACT inhaler, Inhale 1-2 puffs into the lungs every 6 (six) hours as needed for wheezing or shortness of breath., Disp: 8 g, Rfl: 0   aspirin 81 MG EC tablet, Take 81 mg by mouth daily., Disp: , Rfl:    atorvastatin (LIPITOR) 80 MG tablet, TAKE 1 TABLET BY MOUTH ONCE DAILY AT  6  PM, Disp: 90 tablet, Rfl: 0   Blood Glucose Monitoring Suppl (ACCU-CHEK GUIDE ME) w/Device KIT, Please use to test blood sugar 3 times daily as directed. DX: E11.9, Disp: 1 kit, Rfl: 0   diclofenac sodium (VOLTAREN) 1 % GEL,   Apply 2 g topically 4 (four) times daily., Disp: 100 g, Rfl: 1   fish oil-omega-3 fatty acids 1000 MG capsule, Take 2 g by mouth daily., Disp: , Rfl:    glucose blood (ACCU-CHEK GUIDE) test strip, Please use to test blood sugar 3 times daily as directed. DX: E11.9, Disp: 100 each, Rfl: 12   LANTUS SOLOSTAR 100 UNIT/ML Solostar Pen, INJECT 48 UNITS SUBCUTANEOUSLY ONCE DAILY AT 10 PM (Patient taking differently: 30 Units.), Disp: 15 mL, Rfl: 0   lisinopril (ZESTRIL) 10 MG tablet, Take 1 tablet by mouth once daily, Disp: 90 tablet, Rfl: 0   sucralfate (CARAFATE) 1 GM/10ML suspension, Take 1 g by mouth 4 (four) times daily., Disp: , Rfl:    TRULICITY 3 IW/9.7LG SOPN, INJECT 1 DOSE  SUBCUTANEOUSLY ONCE A WEEK, Disp: 12 mL, Rfl: 0 Social History   Socioeconomic History   Marital status: Married    Spouse name: Not on file   Number of children: 2   Years of education: Not on file   Highest education level: Not on file  Occupational History   Occupation: disabled  Tobacco Use   Smoking status: Never   Smokeless tobacco: Never  Vaping Use   Vaping Use: Never used  Substance and Sexual Activity   Alcohol use: No   Drug use: No   Sexual activity: Not on file  Other Topics Concern   Not on file  Social History Narrative   Not on file   Social Determinants of Health   Financial Resource Strain: Low Risk    Difficulty of Paying Living Expenses: Not very hard  Food Insecurity: No Food Insecurity   Worried About Running Out of Food in the Last Year: Never true   Ran Out of Food in the Last Year: Never true  Transportation Needs: No Transportation Needs   Lack of Transportation (Medical): No   Lack of Transportation (Non-Medical): No  Physical Activity: Not on file  Stress: Not on file  Social Connections: Moderately Integrated   Frequency of Communication with Friends and Family: More than three times a week   Frequency of Social Gatherings with Friends and Family: More than three times a week   Attends Religious Services: More than 4 times per year   Active Member of [employer] or Organizations: Yes   Attends Archivist Meetings: More than 4 times per year   Marital Status: Widowed  Human resources officer Violence: Not on file   Family History  Problem Relation Age of Onset   Coronary artery disease Mother    Coronary artery disease Father    Heart disease Brother    Heart disease Brother    Lung cancer Brother 50   Heart attack Brother 42       had a few heart attacks before   Heart disease Brother     Objective: Office vital signs reviewed. BP 138/89   Pulse 80   Temp 97.6 F (36.4 C) (Temporal)   Ht 5' 1" (1.549 m)   Wt 147 lb 3.2 oz  (66.8 kg)   SpO2 100%   BMI 27.81 kg/m   Physical Examination:  General: Awake, alert, chronically ill-appearing female, No acute distress HEENT: Normal; no exophthalmos or goiter Cardio: regular rate and rhythm, S1S2 heard, no murmurs appreciated Pulm: clear to auscultation bilaterally, no wheezes, rhonchi or rales; normal work of breathing on room air MSK: Utilizes rolling walker for ambulation.  Gait is slow  Assessment/ Plan: 60 y.o. female  Diabetes mellitus with stage 3 chronic kidney disease ([place]) - Plan: Bayer DCA Hb A1c Waived  Hypertension associated with diabetes ([place]) - Plan: CMP14+EGFR  Hyperlipidemia associated with type 2 diabetes mellitus ([place]) - Plan: Lipid Panel, CMP14+EGFR  Acquired hypothyroidism - Plan: TSH, T4, Free  Sugar remains under tight control despite discontinuation of Trulicity and he much no use of Lantus.  Suspect this is secondary to p.o. intolerance/reduction  Blood pressures controlled.  No changes  Check lipid panel  Check thyroid labs  No orders of the defined types were placed in this encounter.  No orders of the defined types were placed in this encounter.    [name], DO [place] [phone]

## 2021-07-05 LAB — LIPID PANEL
Chol/HDL Ratio: 2.3 ratio (ref 0.0–4.4)
Cholesterol, Total: 123 mg/dL (ref 100–199)
HDL: 53 mg/dL (ref >39)
LDL Chol Calc (NIH): 55 mg/dL (ref 0–99)
Triglycerides: 76 mg/dL (ref 0–149)
VLDL Cholesterol Cal: 15 mg/dL (ref 5–40)

## 2021-07-05 LAB — CMP14+EGFR
ALT: 24 IU/L (ref 0–32)
AST: 17 IU/L (ref 0–40)
Albumin/Globulin Ratio: 1.9 (ref 1.2–2.2)
Albumin: 4.7 g/dL (ref 3.8–4.8)
Alkaline Phosphatase: 62 IU/L (ref 44–121)
BUN/Creatinine Ratio: 21 (ref 12–28)
BUN: 15 mg/dL (ref 8–27)
Bilirubin Total: 0.7 mg/dL (ref 0.0–1.2)
CO2: 25 mmol/L (ref 20–29)
Calcium: 10.3 mg/dL (ref 8.7–10.3)
Chloride: 99 mmol/L (ref 96–106)
Creatinine, Ser: 0.71 mg/dL (ref 0.57–1.00)
Globulin, Total: 2.5 g/dL (ref 1.5–4.5)
Glucose: 136 mg/dL — ABNORMAL HIGH (ref 65–99)
Potassium: 4.6 mmol/L (ref 3.5–5.2)
Sodium: 139 mmol/L (ref 134–144)
Total Protein: 7.2 g/dL (ref 6.0–8.5)
eGFR: 96 mL/min/{1.73_m2} (ref >59)

## 2021-07-05 LAB — TSH: TSH: 4.17 u[IU]/mL (ref 0.450–4.500)

## 2021-07-05 LAB — T4, FREE: Free T4: 1.2 ng/dL (ref 0.82–1.77)

## 2021-07-11 ENCOUNTER — Other Ambulatory Visit: Payer: Self-pay

## 2021-07-11 ENCOUNTER — Inpatient Hospital Stay: Payer: Medicare Other

## 2021-07-11 ENCOUNTER — Ambulatory Visit: Payer: Medicare Other

## 2021-07-11 DIAGNOSIS — I10 Essential (primary) hypertension: Secondary | ICD-10-CM | POA: Diagnosis not present

## 2021-07-11 DIAGNOSIS — I700 Atherosclerosis of aorta: Secondary | ICD-10-CM | POA: Diagnosis not present

## 2021-07-11 DIAGNOSIS — E1165 Type 2 diabetes mellitus with hyperglycemia: Secondary | ICD-10-CM

## 2021-07-11 DIAGNOSIS — R002 Palpitations: Secondary | ICD-10-CM | POA: Diagnosis not present

## 2021-07-19 DIAGNOSIS — E1159 Type 2 diabetes mellitus with other circulatory complications: Secondary | ICD-10-CM | POA: Diagnosis not present

## 2021-07-19 DIAGNOSIS — I152 Hypertension secondary to endocrine disorders: Secondary | ICD-10-CM | POA: Diagnosis not present

## 2021-07-19 DIAGNOSIS — E1122 Type 2 diabetes mellitus with diabetic chronic kidney disease: Secondary | ICD-10-CM | POA: Diagnosis not present

## 2021-07-19 DIAGNOSIS — N183 Chronic kidney disease, stage 3 unspecified: Secondary | ICD-10-CM | POA: Diagnosis not present

## 2021-07-19 NOTE — Progress Notes (Signed)
Called and spoke to pt, pt voiced understanding.

## 2021-07-22 ENCOUNTER — Other Ambulatory Visit: Payer: Self-pay | Admitting: Family Medicine

## 2021-07-22 DIAGNOSIS — I10 Essential (primary) hypertension: Secondary | ICD-10-CM

## 2021-07-31 ENCOUNTER — Encounter: Payer: Self-pay | Admitting: Family Medicine

## 2021-07-31 ENCOUNTER — Ambulatory Visit (INDEPENDENT_AMBULATORY_CARE_PROVIDER_SITE_OTHER): Payer: Medicare Other | Admitting: Family Medicine

## 2021-07-31 ENCOUNTER — Ambulatory Visit (INDEPENDENT_AMBULATORY_CARE_PROVIDER_SITE_OTHER): Payer: Medicare Other | Admitting: *Deleted

## 2021-07-31 DIAGNOSIS — R002 Palpitations: Secondary | ICD-10-CM

## 2021-07-31 DIAGNOSIS — R1013 Epigastric pain: Secondary | ICD-10-CM

## 2021-07-31 DIAGNOSIS — J029 Acute pharyngitis, unspecified: Secondary | ICD-10-CM | POA: Diagnosis not present

## 2021-07-31 DIAGNOSIS — I152 Hypertension secondary to endocrine disorders: Secondary | ICD-10-CM

## 2021-07-31 DIAGNOSIS — H60501 Unspecified acute noninfective otitis externa, right ear: Secondary | ICD-10-CM

## 2021-07-31 DIAGNOSIS — E1122 Type 2 diabetes mellitus with diabetic chronic kidney disease: Secondary | ICD-10-CM

## 2021-07-31 MED ORDER — OFLOXACIN 0.3 % OT SOLN
5.0000 [drp] | Freq: Every day | OTIC | 0 refills | Status: AC
Start: 2021-07-31 — End: 2021-08-07

## 2021-07-31 MED ORDER — LEVOCETIRIZINE DIHYDROCHLORIDE 5 MG PO TABS: 5.0000 mg | ORAL_TABLET | Freq: Every evening | ORAL | 0 refills | Status: DC

## 2021-07-31 NOTE — Patient Instructions (Signed)
Visit Information  PATIENT GOALS:  Goals Addressed             This Visit's Progress    Increase Knowledge of Health Condition and Protect Health       Timeframe:  Short-Term Goal Priority:  High Progress: Not on Track Start Date:   02/26/21                          Expected End Date:  05/29/21          Follow-up: 08/21/21  Call PCP or GI with any new or worsening symptoms Continue to eat small, frequent meals and to drink pedialyte to replace electrolytes  Talk with GI about a feeding tube Talk with friends/family daily Get outside for at least a few minutes everyday Talk with LCSW regarding psychosocial issues Call [place] as needed [phone] Keep follow-up appointment with GI next week     RNCM: Follow Up with Cardiologist Regarding Heart Health       Timeframe:  Long-Range Goal Priority:  Medium Start Date:  07/31/21                          Expected End Date:  10/20/21                     Follow-up 08/21/21  Keep appointment with cardiologist on 08/08/21 Call cardiologist with any new or worsening symptoms Try to increase nutrient and calorie intake. Talk with GI about feeding tube.  Take medications as prescribed Rest as needed Call RN Care Manager as needed [phone]       RNCM: Monitor and Manage Blood Pressure   On track    Timeframe:  Long-Range Goal Priority:  Medium Start Date:  07/01/21                           Expected End Date:                       Follow-up: 08/21/21  Check and record blood pressure daily and PRN Call cardiologist or PCP with any readings outside of recommended range Keep appt with cardiologist and for cardiac testing Call [place] as needed [phone]      RNCM: Monitor and Manage My Blood Sugar-Diabetes Type 2   On track    Timeframe:  Long-Range Goal Priority:  Medium Start Date:    02/07/21                         Expected End Date:   10/19/21                    Follow Up Date 08/21/21   Check and  record blood sugar before meals and at bedtime Call PCP with any readings outside of recommended range Take medications as prescribed. Continue to hold Lantus if blood sugar is below 150 or above 150 and dropping Try to eat meals at regular intervals Seek appropriate medical care for any new or worsening symptoms Call RN Care Manager as needed [phone]   Why is this important?   Checking your blood sugar at home helps to keep it from getting very high or very low.  Writing the results in a diary or log helps the doctor know how to care for you.  Your blood sugar log should have the time, date and the results.  Also, write down the amount of insulin or other medicine that you take.  Other information, like what you ate, exercise done and how you were feeling, will also be helpful.     Notes:         The patient verbalized understanding of instructions, educational materials, and care plan provided today and declined offer to receive copy of patient instructions, educational materials, and care plan.   Plan:Telephone follow up appointment with care management team member scheduled for:  08/21/21 with RNCM and The patient has been provided with contact information for the care management team and has been advised to call with any health related questions or concerns.   Chong Sicilian, BSN, RN-BC Embedded Chronic Care Manager [facility] / [place] [facility] Direct Dial: [phone]

## 2021-07-31 NOTE — Chronic Care Management (AMB) (Signed)
Chronic Care Management   CCM RN Visit Note  07/31/2021 Name: Casey Sanders MRN: [id number] DOB: Jul 19, 1961  Subjective: Casey Sanders is a 60 y.o. year old female who is a primary care patient of [name], DO. The care management team was consulted for assistance with disease management and care coordination needs.    Engaged with patient by telephone for follow up visit in response to provider referral for case management and/or care coordination services.   Consent to Services:  The patient was given information about Chronic Care Management services, agreed to services, and gave verbal consent prior to initiation of services.  Please see initial visit note for detailed documentation.   Patient agreed to services and verbal consent obtained.   Assessment: Review of patient past medical history, allergies, medications, health status, including review of consultants reports, laboratory and other test data, was performed as part of comprehensive evaluation and provision of chronic care management services.   SDOH (Social Determinants of Health) assessments and interventions performed:    CCM Care Plan  Allergies  Allergen Reactions   Codeine Hives, Nausea And Vomiting and Other (See Comments)   Jardiance [Empagliflozin]     Caused blisters   Metformin And Related     nausea   Niaspan [Niacin Er] Other (See Comments)    Increase blood glucose    Outpatient Encounter Medications as of 07/31/2021  Medication Sig Note   Accu-Chek Softclix Lancets lancets Please use to test blood sugar 3 times daily as directed. DX: E11.9    albuterol (VENTOLIN HFA) 108 (90 Base) MCG/ACT inhaler Inhale 1-2 puffs into the lungs every 6 (six) hours as needed for wheezing or shortness of breath.    aspirin 81 MG EC tablet Take 81 mg by mouth daily.    atorvastatin (LIPITOR) 80 MG tablet TAKE 1 TABLET BY MOUTH ONCE DAILY AT  6  PM    Blood Glucose Monitoring Suppl (ACCU-CHEK GUIDE ME)  w/Device KIT Please use to test blood sugar 3 times daily as directed. DX: E11.9    diclofenac sodium (VOLTAREN) 1 % GEL Apply 2 g topically 4 (four) times daily.    fish oil-omega-3 fatty acids 1000 MG capsule Take 2 g by mouth daily.    glucose blood (ACCU-CHEK GUIDE) test strip Please use to test blood sugar 3 times daily as directed. DX: E11.9    LANTUS SOLOSTAR 100 UNIT/ML Solostar Pen INJECT 48 UNITS SUBCUTANEOUSLY ONCE DAILY AT 10 PM (Patient taking differently: 30 Units.)    lisinopril (ZESTRIL) 10 MG tablet Take 1 tablet by mouth once daily    sucralfate (CARAFATE) 1 GM/10ML suspension Take 1 g by mouth 4 (four) times daily. 05/21/2021: Hasn't picked up from pharmacy but was instructed today to pick it up   TRULICITY 3 LM/7.8ML SOPN INJECT 1 DOSE SUBCUTANEOUSLY ONCE A WEEK    No facility-administered encounter medications on file as of 07/31/2021.    Patient Active Problem List   Diagnosis Date Noted   Hypertensive disorder 12/14/2018   Asthma 03/09/2017   Vitamin D deficiency 03/09/2017   Generalized anxiety disorder 05/23/2016   Gastroesophageal reflux disease 05/23/2016   Depressive disorder 05/23/2016   Fibromyalgia 05/23/2016   Family history of cardiac disorder 05/23/2016   Diabetic neuropathy ([place]) 12/19/2013   Neuropathy due to type 2 diabetes mellitus ([place]) 03/17/2013   Degeneration of lumbar intervertebral disc 01/04/2013   Chronic pain syndrome 01/04/2013   Arthropathy of lumbar facet joint 01/04/2013   Syncope 02/12/2011  Diabetes mellitus ([place]) 01/04/2011   Chronic pain 01/04/2011   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 01/04/2011   Hyperlipidemia associated with type 2 diabetes mellitus ([place]) 01/04/2011   Restless legs 01/04/2011   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[place]) 01/04/2011   Palpitations 01/04/2011    Conditions to be addressed/monitored:HTN, DMII, and palpitations, Nausea/vomiting  Care Plan : Muse   Updates made by Ilean China, RN since 07/31/2021 12:00 AM     Problem: Chronic Disease Management Needs   Priority: Medium     Long-Range Goal: Work with [facility] Regarding Care Management and Care Coordination Needs Associated with Diabetes, HTN, Anxiety, and Depression   This Visit's Progress: On track  Recent Progress: On track  Priority: Medium  Note:   Current Barriers:  Chronic Disease Management support and education needs related to HTN, DMII, Anxiety, and Depression  RNCM Clinical Goal(s):  Patient will work with RN Case Manager to address needs related to HTN, DMII, Anxiety, and Depression and Limited education about current disease process causing loss of appetite and discomfort* and grief over death of husband. take all medications exactly as prescribed and will call provider for medication related questions demonstrate ongoing self health care management ability through collaboration with RN Care manager, provider, and care team.  Continue to work with specialists regarding nausea, vomiting, weight loss, and palpitations  Interventions: 1:1 collaboration with primary care provider regarding development and update of comprehensive plan of care as evidenced by provider attestation and co-signature Inter-disciplinary care team collaboration (see longitudinal plan of care) Evaluation of current treatment plan related to  self management and patient's adherence to plan as established by provider Provided with RNCM contact number and encouraged to reach out as needed   Diabetes:  (Status: Goal on track: YES.) Lab Results  Component Value Date   HGBA1C 6.1 (H) 07/04/2021   HGBA1C 6.9 02/19/2021   HGBA1C 7.4 (H) 11/28/2020   Lab Results  Component Value Date   [place] 55 07/04/2021   CREATININE 0.71 07/04/2021  Assessed patient's understanding of A1c goal: <7% Reviewed and discussed medications and importance of adherence Assessed for adequate family/social  support Discussed diet and appetite Can only eat small amounts at a time due to nausea/vomiting Eating about 4 small meals a day Discussed blood sugar management while sick Encouraged to continue checking blood sugar regularly and to call PCP with any readings outside of recommended range Discussed low blood sugar readings. Averaging between 80 and 129. Cardiology advised to not take Lantus since she isn't able to eat regularly. Blood sugar was 178 this morning after not taking Lantus yesterday.  Collaborated with front office staff to schedule an appt with Lottie Dawson, PharmD on 9/16 to discuss medication management Discussed importance of eating meals at regular intervals while taking on a long acting insulin Provided with RNCM contact number and encouraged to reach out as needed Assessed transportation needs. Patient is able to arrange appointments so that her family can take her.  Hypertension: (Status: Goal on track: YES.) Last practice recorded BP readings:  BP Readings from Last 3 Encounters:  07/04/21 138/89  06/27/21 133/84  02/19/21 125/86    Evaluation of current treatment plan related to hypertension self management and patient's adherence to plan as established by provider;   Reinforced need to check blood pressure at least 3 times per week and to notify PCP of any readings outside of recommended range Discussed recent cardiology appointment and future testing.  Discussed echo results and pending heart monitor results Reviewed upcoming appt with cardiologist to review results and for further testing  Encouraged patient check and record blood pressure daily and PRN and to call PCP or cardiologist with any readings outside of recommended range Encouraged to keep all medical appointments  Confirmed that patient has transportation to this appointment   Weight Loss/Nausea/Abd Pain/Decreased Appetite:  (Status: Goal on track: NO.) Evaluation of current treatment plan related to  recurrent nausea and vomiting and patient's adherence to plan as established by provider. Chart reviewed including relevant office notes, lab results, and hospitalization note Reviewed and discussed medications and compliance Discussed recent follow-up with GI and upcoming appointment with [facility] Discussed meals. Patient is still eating 4 small meals a day. Appetite is decreased and she feels full quickly.  Discussed symptoms Still having esophageal and stomach burning Taking medications as prescribed Still vomiting about every-other-day Asked patient if GI has discussed feeding tube. She says that they have and that they are going to do more tests and then discuss feeding tube further.  Has lost about 100 lbs since beginning of illness She is weak and using a walker. Has had falls due to weakness. Unable to take in enough nutrients orally Encouraged patient to reach out to GI or seek appropriate medical attention as needed Discussed impact of poor oral intake and N/V on heart and musculoskeletal system as well as GI Increased risk for osteoporosis and fractures and damage to heart muscle Reassessed family/social support Reviewed upcoming appointments Advised to seek appropriate medical attention for any new or worsening symptoms Encouraged to call PCP or GI with any new or worsening symptoms  Patient Goals/Self-Care Activities: Patient will self administer medications as prescribed Patient will attend all scheduled provider appointments Patient will continue to perform ADL's independently Patient will continue to perform IADL's independently Patient will call provider office for new concerns or questions       Plan:Telephone follow up appointment with care management team member scheduled for:  08/21/21 with RNCM and The patient has been provided with contact information for the care management team and has been advised to call with any health related questions or concerns.    Chong Sicilian, BSN, RN-BC Embedded Chronic Care Manager [facility] Direct Dial: [phone]

## 2021-07-31 NOTE — Progress Notes (Signed)
   Virtual Visit  Note Due to COVID-19 pandemic this visit was conducted virtually. This visit type was conducted due to national recommendations for restrictions regarding the COVID-19 Pandemic (e.g. social distancing, sheltering in place) in an effort to limit this patient's exposure and mitigate transmission in our community. All issues noted in this document were discussed and addressed.  A physical exam was not performed with this format.  I connected with [name] on 07/31/21 at 1654 by telephone and verified that I am speaking with the correct person using two identifiers. Hagen Tidd is currently located at home and no one is currently with her during visit. The provider, Gwenlyn Perking, FNP is located in their office at time of visit.  I discussed the limitations, risks, security and privacy concerns of performing an evaluation and management service by telephone and the availability of in person appointments. I also discussed with the patient that there may be a patient responsible charge related to this service. The patient expressed understanding and agreed to proceed.  CC: ear pain  History and Present Illness:  HPI Alianys reports a sore throat and right ear pain since last night. She reports that her right ear is warmth and tender to the touch. Denies drainage or swelling of ear. She also has a dry cough. Denies fever, chills, congestion, chest pain, shortness of breath, nausea, vomiting, or diarrhea. She has had a negative home Covid test. Her grandchildren have had sore throats but were negative for Strep throat.     ROS As per HPI.   Observations/Objective: Alert and oriented x 3. Able to speak in full sentences without difficulty.   Assessment and Plan: Reghan was seen today for ear pain.  Diagnoses and all orders for this visit:  Acute otitis externa of right ear, unspecified type Ofloxacin as below.  -     ofloxacin (FLOXIN OTIC) 0.3 % OTIC solution; Place  5 drops into the right ear daily for 7 days.  Sore throat Discussed viral etiology. Negative home Covid test, declined PCR Covid test. Xyzal as below.  -     levocetirizine (XYZAL) 5 MG tablet; Take 1 tablet (5 mg total) by mouth every evening.    Follow Up Instructions: Return to office for new or worsening symptoms, or if symptoms persist.     I discussed the assessment and treatment plan with the patient. The patient was provided an opportunity to ask questions and all were answered. The patient agreed with the plan and demonstrated an understanding of the instructions.   The patient was advised to call back or seek an in-person evaluation if the symptoms worsen or if the condition fails to improve as anticipated.  The above assessment and management plan was discussed with the patient. The patient verbalized understanding of and has agreed to the management plan. Patient is aware to call the clinic if symptoms persist or worsen. Patient is aware when to return to the clinic for a follow-up visit. Patient educated on when it is appropriate to go to the emergency department.   Time call ended: 1705   I provided 11 minutes of  non face-to-face time during this encounter.    Gwenlyn Perking, FNP

## 2021-08-01 DIAGNOSIS — R002 Palpitations: Secondary | ICD-10-CM | POA: Diagnosis not present

## 2021-08-06 ENCOUNTER — Other Ambulatory Visit: Payer: Self-pay | Admitting: Family Medicine

## 2021-08-06 DIAGNOSIS — Z1231 Encounter for screening mammogram for malignant neoplasm of breast: Secondary | ICD-10-CM

## 2021-08-08 ENCOUNTER — Encounter: Payer: Self-pay | Admitting: Cardiology

## 2021-08-08 ENCOUNTER — Ambulatory Visit: Payer: Medicare Other | Admitting: Cardiology

## 2021-08-08 ENCOUNTER — Other Ambulatory Visit: Payer: Self-pay

## 2021-08-08 VITALS — BP 147/79 | HR 77 | Temp 97.6°F | Resp 16 | Ht 61.0 in | Wt 153.8 lb

## 2021-08-08 DIAGNOSIS — E1165 Type 2 diabetes mellitus with hyperglycemia: Secondary | ICD-10-CM | POA: Diagnosis not present

## 2021-08-08 DIAGNOSIS — R002 Palpitations: Secondary | ICD-10-CM | POA: Diagnosis not present

## 2021-08-08 DIAGNOSIS — I4729 Other ventricular tachycardia: Secondary | ICD-10-CM

## 2021-08-08 DIAGNOSIS — E1169 Type 2 diabetes mellitus with other specified complication: Secondary | ICD-10-CM

## 2021-08-08 DIAGNOSIS — Z794 Long term (current) use of insulin: Secondary | ICD-10-CM | POA: Diagnosis not present

## 2021-08-08 DIAGNOSIS — I700 Atherosclerosis of aorta: Secondary | ICD-10-CM

## 2021-08-08 DIAGNOSIS — I10 Essential (primary) hypertension: Secondary | ICD-10-CM

## 2021-08-08 NOTE — Progress Notes (Signed)
Date:  06/27/2021   ID:  Casey Sanders, DOB 10-30-1960, MRN [id number]  PCP:  [name], DO  Cardiologist:  Rex Kras, DO, [facility] (established care 06/27/2021) Former Cardiology Providers: Dr. Kirk Ruths  Date: 08/08/21 Last Office Visit: 06/27/2021  Chief Complaint  Patient presents with   Palpitations   Results   Follow-up    HPI  Casey Sanders is a 60 y.o. female who presents to the office with a chief complaint of " reevaluation of palpitation and discuss test results." Patient's past medical history and cardiovascular risk factors include: Hypertension, insulin-dependent diabetes mellitus type 2, hyperlipidemia, GERD, aortic atherosclerosis.  She is referred to the office at the request of [name], DO for evaluation of palpitations.  Patient symptoms of palpitation have been ongoing for at least 1 year prior to being evaluated by cardiology.  At the last office visit the shared decision was to proceed with an extended Holter monitor to evaluate for dysrhythmias.  Results reviewed with her in great detail.  No significant dysrhythmia noted.  Suspected that her self symptoms of palpitation are most likely secondary to dehydration, poor oral intake, stress/anxiety, and questionable episodes of hypoglycemia as she is on insulin therapy despite having poor oral intake.  Since last visit patient states that her symptoms of palpitation have improved significantly but still present.  Reviewed the results of the echo and extended Holter monitor with her in great detail and noted below for further reference.  She continues to have burning-like sensation originating from substernal region and traveling up to the throat and up to the epigastric region.  However, given her multiple cardiovascular risk factors including insulin-dependent diabetes, aortic atherosclerosis, NSVT on the monitor, we discussed undergoing an ischemic evaluation.  Patient is agreeable.  Family  history of premature coronary artery disease: Per patient she has 4 brothers who all have had heart disease younger than 62 years of age.  FUNCTIONAL STATUS: No structured exercise program or daily routine.    ALLERGIES: Allergies  Allergen Reactions   Codeine Hives, Nausea And Vomiting and Other (See Comments)   Jardiance [Empagliflozin]     Caused blisters   Metformin And Related     nausea   Niaspan [Niacin Er] Other (See Comments)    Increase blood glucose    MEDICATION LIST PRIOR TO VISIT: Current Meds  Medication Sig   Accu-Chek Softclix Lancets lancets Please use to test blood sugar 3 times daily as directed. DX: E11.9   albuterol (VENTOLIN HFA) 108 (90 Base) MCG/ACT inhaler Inhale 1-2 puffs into the lungs every 6 (six) hours as needed for wheezing or shortness of breath.   aspirin 81 MG EC tablet Take 81 mg by mouth daily.   atorvastatin (LIPITOR) 80 MG tablet TAKE 1 TABLET BY MOUTH ONCE DAILY AT  6  PM   Blood Glucose Monitoring Suppl (ACCU-CHEK GUIDE ME) w/Device KIT Please use to test blood sugar 3 times daily as directed. DX: E11.9   diclofenac sodium (VOLTAREN) 1 % GEL Apply 2 g topically 4 (four) times daily.   fish oil-omega-3 fatty acids 1000 MG capsule Take 2 g by mouth daily.   glucose blood (ACCU-CHEK GUIDE) test strip Please use to test blood sugar 3 times daily as directed. DX: E11.9   LANTUS SOLOSTAR 100 UNIT/ML Solostar Pen INJECT 48 UNITS SUBCUTANEOUSLY ONCE DAILY AT 10 PM (Patient taking differently: 30 Units.)   levocetirizine (XYZAL) 5 MG tablet Take 1 tablet (5 mg total) by mouth every  evening.   lisinopril (ZESTRIL) 10 MG tablet Take 1 tablet by mouth once daily   TRULICITY 3 XB/1.4NW SOPN INJECT 1 DOSE SUBCUTANEOUSLY ONCE A WEEK     PAST MEDICAL HISTORY: Past Medical History:  Diagnosis Date   Allergic rhinitis    Anxiety and depression    Asthma    Benign essential HTN 01/04/2011   Chest pain    a. Myoview 9/05: no ischemia, no scar, EF 70%    Chest pain, unspecified 02/12/2011   Cystitis 02/08/2015   Diverticulosis    DM2 (diabetes mellitus, type 2) ([place])    Extremity pain 01/04/2013   Fibromyalgia    Frequent UTI    GERD (gastroesophageal reflux disease)    Hiatal Hernia   HLD (hyperlipidemia)    HTN (hypertension)    Left rotator cuff tear    Low back pain 01/04/2013   Lumbar disc disease    Nephrolithiasis    Obesity    Palpitations    monitor 9/05: NSR, sinus tachy, PVCs   Pre-operative cardiovascular examination 02/12/2011   Rectal bleeding 08/22/2013   Sepsis secondary to UTI ([place]) 08/09/2017   uterine ca dx'd 1988   per pt    PAST SURGICAL HISTORY: Past Surgical History:  Procedure Laterality Date   ABDOMINAL HYSTERECTOMY     CESAREAN SECTION     ESOPHAGEAL MANOMETRY N/A 06/19/2021   Procedure: ESOPHAGEAL MANOMETRY (EM);  Surgeon: Wilford Corner, MD;  Location: [facility];  Service: Endoscopy;  Laterality: N/A;   FOOT SURGERY     right wrist surgery     ROTATOR CUFF REPAIR Left 2009   tonsillectomy     TOTAL ABDOMINAL HYSTERECTOMY W/ BILATERAL SALPINGOOPHORECTOMY      FAMILY HISTORY: The patient family history includes Coronary artery disease in her father and mother; Heart attack (age of onset: 85) in her brother; Heart disease in her brother, brother, and brother; Lung cancer (age of onset: 38) in her brother.  SOCIAL HISTORY:  The patient  reports that she has never smoked. She has never used smokeless tobacco. She reports that she does not drink alcohol and does not use drugs.  REVIEW OF SYSTEMS: Review of Systems  Constitutional: Negative for chills and fever.  HENT:  Negative for hoarse voice and nosebleeds.   Eyes:  Negative for discharge, double vision and pain.  Cardiovascular:  Positive for palpitations. Negative for chest pain, claudication, dyspnea on exertion, leg swelling, near-syncope, orthopnea, paroxysmal nocturnal dyspnea and syncope.  Respiratory:  Negative for hemoptysis and  shortness of breath.   Musculoskeletal:  Negative for muscle cramps and myalgias.  Gastrointestinal:  Positive for dysphagia, nausea and vomiting. Negative for abdominal pain, constipation, diarrhea, hematemesis, hematochezia and melena.  Neurological:  Positive for dizziness and light-headedness.   PHYSICAL EXAM: Vitals with BMI 08/08/2021 08/08/2021 07/04/2021  Height - _0  _1   Weight - 153 lbs 13 oz 147 lbs 3 oz  BMI - 29.56 21.30  Systolic 865 784 696  Diastolic 79 84 89  Pulse 77 86 80    CONSTITUTIONAL: Well-developed and well-nourished. No acute distress.  SKIN: Skin is warm and dry. No rash noted. No cyanosis. No pallor. No jaundice HEAD: Normocephalic and atraumatic.  EYES: No scleral icterus MOUTH/THROAT: Moist oral membranes.  NECK: No JVD present. No thyromegaly noted. No carotid bruits  LYMPHATIC: No visible cervical adenopathy.  CHEST Normal respiratory effort. No intercostal retractions  LUNGS: Clear to auscultation bilaterally.  No stridor. No wheezes. No rales.  CARDIOVASCULAR:  Regular rate and rhythm, positive S1-S2, no murmurs rubs or gallops appreciated ABDOMINAL: Soft, nontender, nondistended, positive bowel sounds in all 4 quadrants, no apparent ascites.  EXTREMITIES: No peripheral edema  HEMATOLOGIC: No significant bruising NEUROLOGIC: Oriented to person, place, and time. Nonfocal. Normal muscle tone.  PSYCHIATRIC: Normal mood and affect. Normal behavior. Cooperative  CARDIAC DATABASE: EKG: 06/27/2021: Normal sinus rhythm, 82 bpm, poor R wave progression, without underlying ischemia or injury pattern  Echocardiogram: 07/11/2021: Normal LV systolic function with visual EF 60-65%. Left ventricle cavity is normal in size. Normal left ventricular wall thickness. Normal global wall motion. Normal diastolic filling pattern, normal LAP.  Trace tricuspid regurgitation. No evidence of pulmonary hypertension. No prior study for comparison.  14 day extended  Holter monitor: Patch Wear Time:  10 days and 19 hours  Dominant rhythm normal sinus. Heart rate 50-164 bpm.  Avg HR 76 bpm. No atrial fibrillation, high grade AV block, pauses (3 seconds or longer). Total ventricular ectopic burden <1%. Total supraventricular ectopic burden <1%. 1 episode of NSVT, 4 beats in duration, maximum rate of 164 bpm. 1 episode of SVT, 5 beats in duration, maximum rate of 154 bpm. Patient triggered events: 4.  Finding rhythm normal sinus with occasional PVCs.   LABORATORY DATA: CBC Latest Ref Rng & Units 01/15/2021 01/27/2020 08/20/2017  WBC 3.4 - 10.8 x10E3/uL 8.8 7.1 6.5  Hemoglobin 11.1 - 15.9 g/dL 15.6 14.9 11.6  Hematocrit 34.0 - 46.6 % 44.8 44.6 34.7  Platelets 150 - 450 x10E3/uL 249 226 396(H)    CMP Latest Ref Rng & Units 07/04/2021 02/19/2021 01/15/2021  Glucose 65 - 99 mg/dL 136(H) - 161(H)  BUN 8 - 27 mg/dL 15 - 12  Creatinine 0.57 - 1.00 mg/dL 0.71 - 0.83  Sodium 134 - 144 mmol/L 139 - 143  Potassium 3.5 - 5.2 mmol/L 4.6 - 3.5  Chloride 96 - 106 mmol/L 99 - 99  CO2 20 - 29 mmol/L 25 - 19(L)  Calcium 8.7 - 10.3 mg/dL 10.3 10.5(H) 10.5(H)  Total Protein 6.0 - 8.5 g/dL 7.2 - 7.7  Total Bilirubin 0.0 - 1.2 mg/dL 0.7 - 0.7  Alkaline Phos 44 - 121 IU/L 62 - 57  AST 0 - 40 IU/L 17 - 17  ALT 0 - 32 IU/L 24 - 10    Lipid Panel  Lab Results  Component Value Date   CHOL 123 07/04/2021   HDL 53 07/04/2021   LDLCALC 55 07/04/2021   TRIG 76 07/04/2021   CHOLHDL 2.3 07/04/2021    No components found for: NTPROBNP No results for input(s): PROBNP in the last 8760 hours. Recent Labs    11/28/20 1409 01/15/21 1119 07/04/21 0952  TSH 5.380* 4.460 4.170    BMP Recent Labs    11/28/20 1409 01/15/21 1119 02/19/21 0937 07/04/21 0952  NA 141 143  --  139  K 4.4 3.5  --  4.6  CL 101 99  --  99  CO2 25 19*  --  25  GLUCOSE 148* 161*  --  136*  BUN 11 12  --  15  CREATININE 0.83 0.83  --  0.71  CALCIUM 10.4* 10.5* 10.5* 10.3  GFRNONAA 76  --    --   --   GFRAA 88  --   --   --     HEMOGLOBIN A1C Lab Results  Component Value Date   HGBA1C 6.1 (H) 07/04/2021   MPG 166 (H) 07/07/2013    External Labs: 02/08/2021  Sodium  137, potassium 3.5, chloride 101, bicarb 28, BUN 12, creatinine 0.69. AST 18, ALT 16, alkaline phosphatase 42 14.2, hematocrit 40.2%   IMPRESSION:    ICD-10-CM   1. NSVT (nonsustained ventricular tachycardia)  I47.29 PCV MYOCARDIAL PERFUSION WITH LEXISCAN    2. Palpitations  R00.2     3. Benign hypertension  I10     4. Atherosclerosis of aorta (HCC)  I70.0 PCV MYOCARDIAL PERFUSION WITH LEXISCAN    5. Type 2 diabetes mellitus with hyperglycemia, with long-term current use of insulin (HCC)  E11.65 PCV MYOCARDIAL PERFUSION WITH LEXISCAN   Z79.4     6. Long-term insulin use (HCC)  Z79.4 PCV MYOCARDIAL PERFUSION WITH LEXISCAN    7. Hyperlipidemia associated with type 2 diabetes mellitus (HCC)  E11.69    E78.5         RECOMMENDATIONS: Casey Sanders is a 60 y.o. female whose past medical history and cardiac risk factors include: Hypertension, insulin-dependent diabetes mellitus type 2, hyperlipidemia, GERD, aortic atherosclerosis.  NSVT (nonsustained ventricular tachycardia) Noted to have 1 nonsustained episode of NSVT and a recent Holter monitor, continues to have burning substernal chest discomfort, has multiple cardiovascular risk factors including insulin-dependent diabetes.  Shared decision was to proceed with Lexiscan to evaluate for reversible ischemia.  Patient is unable to exercise as she uses a four-wheel walker. Echo: LVEF is preserved, normal diastolic function.  No significant valvular heart disease. Monitor for now  Palpitations Improving EKG nonischemic. Echocardiogram notes preserved LVEF. TSH within normal limits. No identifiable reversible causes. Extended Holter monitor results reviewed. Shared decision was to hold off on pharmacological therapy and monitor symptoms. Labs from  07/04/2021 independently reviewed.  Benign hypertension Office blood pressures within acceptable range. Medications reconciled. Echocardiogram will be ordered to evaluate for structural heart disease and left ventricular systolic function.  Atherosclerosis of aorta (HCC) Continue aspirin and statin therapy.  Type 2 diabetes mellitus with hyperglycemia, with long-term current use of insulin (HCC) Patient's hemoglobin A1c has improved over the last several months most likely secondary to decreased oral intake due to dysphagia. Will defer management of her glycemia and insulin to her other providers.  Hyperlipidemia associated with type 2 diabetes mellitus ([place]) Currently on statin therapy. Denies myalgias. Most recent LDL 55 mg/dL, 06/2021.  FINAL MEDICATION LIST END OF ENCOUNTER: No orders of the defined types were placed in this encounter.   Medications Discontinued During This Encounter  Medication Reason   sucralfate (CARAFATE) 1 GM/10ML suspension Error     Current Outpatient Medications:    Accu-Chek Softclix Lancets lancets, Please use to test blood sugar 3 times daily as directed. DX: E11.9, Disp: 100 each, Rfl: 12   albuterol (VENTOLIN HFA) 108 (90 Base) MCG/ACT inhaler, Inhale 1-2 puffs into the lungs every 6 (six) hours as needed for wheezing or shortness of breath., Disp: 8 g, Rfl: 0   aspirin 81 MG EC tablet, Take 81 mg by mouth daily., Disp: , Rfl:    atorvastatin (LIPITOR) 80 MG tablet, TAKE 1 TABLET BY MOUTH ONCE DAILY AT  6  PM, Disp: 90 tablet, Rfl: 0   Blood Glucose Monitoring Suppl (ACCU-CHEK GUIDE ME) w/Device KIT, Please use to test blood sugar 3 times daily as directed. DX: E11.9, Disp: 1 kit, Rfl: 0   diclofenac sodium (VOLTAREN) 1 % GEL, Apply 2 g topically 4 (four) times daily., Disp: 100 g, Rfl: 1   fish oil-omega-3 fatty acids 1000 MG capsule, Take 2 g by mouth daily., Disp: , Rfl:  glucose blood (ACCU-CHEK GUIDE) test strip, Please use to test blood sugar  3 times daily as directed. DX: E11.9, Disp: 100 each, Rfl: 12   LANTUS SOLOSTAR 100 UNIT/ML Solostar Pen, INJECT 48 UNITS SUBCUTANEOUSLY ONCE DAILY AT 10 PM (Patient taking differently: 30 Units.), Disp: 15 mL, Rfl: 0   levocetirizine (XYZAL) 5 MG tablet, Take 1 tablet (5 mg total) by mouth every evening., Disp: 30 tablet, Rfl: 0   lisinopril (ZESTRIL) 10 MG tablet, Take 1 tablet by mouth once daily, Disp: 90 tablet, Rfl: 1   TRULICITY 3 VG/8.6OY SOPN, INJECT 1 DOSE SUBCUTANEOUSLY ONCE A WEEK, Disp: 12 mL, Rfl: 0  Orders Placed This Encounter  Procedures   PCV MYOCARDIAL PERFUSION WITH LEXISCAN   There are no Patient Instructions on file for this visit.   --Continue cardiac medications as reconciled in final medication list. --Return in about 6 weeks (around 09/19/2021) for Review test results, Reevaluation of, Palpitations. Or sooner if needed. --Continue follow-up with your primary care physician regarding the management of your other chronic comorbid conditions.  Patient's questions and concerns were addressed to her satisfaction. She voices understanding of the instructions provided during this encounter.   This note was created using a voice recognition software as a result there may be grammatical errors inadvertently enclosed that do not reflect the nature of this encounter. Every attempt is made to correct such errors.  Rex Kras, [place], [facility]  Pager: [phone] Office: [phone]

## 2021-08-15 ENCOUNTER — Ambulatory Visit: Payer: Medicare Other | Admitting: Licensed Clinical Social Worker

## 2021-08-15 DIAGNOSIS — E1169 Type 2 diabetes mellitus with other specified complication: Secondary | ICD-10-CM

## 2021-08-15 DIAGNOSIS — F329 Major depressive disorder, single episode, unspecified: Secondary | ICD-10-CM

## 2021-08-15 DIAGNOSIS — E1122 Type 2 diabetes mellitus with diabetic chronic kidney disease: Secondary | ICD-10-CM

## 2021-08-15 DIAGNOSIS — C539 Malignant neoplasm of cervix uteri, unspecified: Secondary | ICD-10-CM

## 2021-08-15 DIAGNOSIS — F411 Generalized anxiety disorder: Secondary | ICD-10-CM

## 2021-08-15 DIAGNOSIS — E1159 Type 2 diabetes mellitus with other circulatory complications: Secondary | ICD-10-CM

## 2021-08-15 DIAGNOSIS — N183 Chronic kidney disease, stage 3 unspecified: Secondary | ICD-10-CM

## 2021-08-15 NOTE — Chronic Care Management (AMB) (Signed)
Chronic Care Management    Clinical Social Work Note  08/15/2021 Name: Casey Sanders MRN: [id number] DOB: [birth year]  Casey Sanders is a 60 y.o. year old female who is a primary care patient of [name], DO. The CCM team was consulted to assist the patient with chronic disease management and/or care coordination needs related to: [employer] .   Engaged with patient by telephone for follow up visit in response to provider referral for social work chronic care management and care coordination services.   Consent to Services:  The patient was given information about Chronic Care Management services, agreed to services, and gave verbal consent prior to initiation of services.  Please see initial visit note for detailed documentation.   Patient agreed to services and consent obtained.   Assessment: Review of patient past medical history, allergies, medications, and health status, including review of relevant consultants reports was performed today as part of a comprehensive evaluation and provision of chronic care management and care coordination services.     SDOH (Social Determinants of Health) assessments and interventions performed:  SDOH Interventions    Flowsheet Row Most Recent Value  SDOH Interventions   Physical Activity Interventions Other (Comments)  [client has walking challenges,  client uses a walker to ambulate]  Stress Interventions Provide Counseling  [client has stress related to managing her medical needs]  Depression Interventions/Treatment  Currently on Treatment        Advanced Directives Status: See Vynca application for related entries.  CCM Care Plan  Allergies  Allergen Reactions   Codeine Hives, Nausea And Vomiting and Other (See Comments)   Jardiance [Empagliflozin]     Caused blisters   Metformin And Related     nausea   Niaspan [Niacin Er] Other (See Comments)    Increase blood glucose    Outpatient Encounter Medications as of  08/15/2021  Medication Sig   Accu-Chek Softclix Lancets lancets Please use to test blood sugar 3 times daily as directed. DX: E11.9   albuterol (VENTOLIN HFA) 108 (90 Base) MCG/ACT inhaler Inhale 1-2 puffs into the lungs every 6 (six) hours as needed for wheezing or shortness of breath.   aspirin 81 MG EC tablet Take 81 mg by mouth daily.   atorvastatin (LIPITOR) 80 MG tablet TAKE 1 TABLET BY MOUTH ONCE DAILY AT  6  PM   Blood Glucose Monitoring Suppl (ACCU-CHEK GUIDE ME) w/Device KIT Please use to test blood sugar 3 times daily as directed. DX: E11.9   diclofenac sodium (VOLTAREN) 1 % GEL Apply 2 g topically 4 (four) times daily.   fish oil-omega-3 fatty acids 1000 MG capsule Take 2 g by mouth daily.   glucose blood (ACCU-CHEK GUIDE) test strip Please use to test blood sugar 3 times daily as directed. DX: E11.9   LANTUS SOLOSTAR 100 UNIT/ML Solostar Pen INJECT 48 UNITS SUBCUTANEOUSLY ONCE DAILY AT 10 PM (Patient taking differently: 30 Units.)   levocetirizine (XYZAL) 5 MG tablet Take 1 tablet (5 mg total) by mouth every evening.   lisinopril (ZESTRIL) 10 MG tablet Take 1 tablet by mouth once daily   TRULICITY 3 JS/2.8BT SOPN INJECT 1 DOSE SUBCUTANEOUSLY ONCE A WEEK   No facility-administered encounter medications on file as of 08/15/2021.    Patient Active Problem List   Diagnosis Date Noted   Hypertensive disorder 12/14/2018   Asthma 03/09/2017   Vitamin D deficiency 03/09/2017   Generalized anxiety disorder 05/23/2016   Gastroesophageal reflux disease 05/23/2016   Depressive disorder 05/23/2016  Fibromyalgia 05/23/2016   Family history of cardiac disorder 05/23/2016   Diabetic neuropathy ([place]) 12/19/2013   Neuropathy due to type 2 diabetes mellitus ([place]) 03/17/2013   Degeneration of lumbar intervertebral disc 01/04/2013   Chronic pain syndrome 01/04/2013   Arthropathy of lumbar facet joint 01/04/2013   Syncope 02/12/2011   Diabetes mellitus ([place]) 01/04/2011   Chronic pain  01/04/2011   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 01/04/2011   Hyperlipidemia associated with type 2 diabetes mellitus ([place]) 01/04/2011   Restless legs 01/04/2011   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/04/2011   Palpitations 01/04/2011    Conditions to be addressed/monitored: monitor client management of anxiety issues and of depression issues  Care Plan : LCSW Care Plan  Updates made by Katha Cabal, LCSW since 08/15/2021 12:00 AM     Problem: Emotional Distress      Goal: Emotional Health Supported;Manage Anxiety issues; Manage Depression issues   Start Date: 08/15/2021  Expected End Date: 11/11/2021  This Visit's Progress: On track  Recent Progress: Not on track  Priority: High  Note:   Current Barriers:  Chronic Mental Health needs related to anxiety issues and depression issues Mobility challenges Suicidal Ideation/Homicidal Ideation: No Weight loss  Nausea, vomiting challenges  Clinical Social Work Goal(s):  patient will work with SW monthly by telephone or in person to reduce or manage symptoms related to anxiety issues and depression issues patient will work with SW monthly to address concerns related to mobility of client and related to client completion of ADLs Client will communicate as needed in next 30 days with RNCM to discuss nursing needs of client  Interventions: 1:1 collaboration with [name], DO regarding development and update of comprehensive plan of care as evidenced by provider attestation and co-signature Discussed with client support from [facility] with CCM program Discussed with client pain issues of client and sleeping challenges of client.  Reviewed with client the swallowing challenges of client Reviewed with client the energy level of client. She said she has reduced energy level. Discussed mood status with client. She said her mood was stable. She did not mention any mood problems.  However, she said she is concerned over weight loss of about 70 pounds in a 7-8 month period.  She said she has also talked with her cardiologist about these issues. Discussed with client mobility of client. She said she uses a walker to help her ambulate Discussed client relaxation techniques (she likes to watch TV, likes to crochet, likes to crossstitch, enjoys drawing or coloring.) Discussed with client her family support (grandson lives with her and is helpful to client; daughters are supportive and both daughters live nearby) Provided counseling support for client Reviewed with client her counseling support with agency in [place], [place] Reviewed client use of supplements. She said she uses boost orensure supplements. She said she usually drinks about 3 cans of supplement daily. Discussed vision of client. She wears glasses; she said she has a vision appointment in November of 2022.  Patient Self Care Activities:  Self administers medications as prescribed  Patient Coping Strengths:  Family Friends  Patient Self Care Deficits:  Anxiety issues Depression issues  Patient Goals:  - spend time or talk with others at least 2 to 3 times per week - practice relaxation or meditation daily - keep a calendar with appointment dates  Follow Up Plan: LCSW to call client on 10/08/21 at 10:00 AM to assess client needs  Norva Riffle.Lilyth Lawyer MSW, LCSW Licensed Clinical Social Worker [facility] Care Management [phone]

## 2021-08-15 NOTE — Patient Instructions (Signed)
Visit Information  PATIENT GOALS:  Goals Addressed             This Visit's Progress    Protect My Health;Manage Anxiety issues; Manage Depression issues       Timeframe:  Short-Term Goal Priority:  High Progress: On Track Start Date:   08/15/21                          Expected End Date:  11/11/21               Follow Up Date 10/08/21 at 10:00 AM  Protect My Health; Manage Anxiety issues; Manage Depression issues  Patient Self Care Activities:  Self administers medications as prescribed  Patient Coping Strengths:  Family Friends  Patient Self Care Deficits:  Anxiety issues Depression issues  Patient Goals:  - spend time or talk with others at least 2 to 3 times per week - practice relaxation or meditation daily - keep a calendar with appointment dates  Follow Up Plan: LCSW to call client on 10/08/21 at 10:00 AM to assess client needs     Norva Riffle.Buryl Bamber MSW, LCSW Licensed Clinical Social Worker [facility] Care Management [phone]

## 2021-08-19 DIAGNOSIS — N183 Chronic kidney disease, stage 3 unspecified: Secondary | ICD-10-CM | POA: Diagnosis not present

## 2021-08-19 DIAGNOSIS — E1159 Type 2 diabetes mellitus with other circulatory complications: Secondary | ICD-10-CM | POA: Diagnosis not present

## 2021-08-19 DIAGNOSIS — E1169 Type 2 diabetes mellitus with other specified complication: Secondary | ICD-10-CM

## 2021-08-19 DIAGNOSIS — E785 Hyperlipidemia, unspecified: Secondary | ICD-10-CM

## 2021-08-19 DIAGNOSIS — I152 Hypertension secondary to endocrine disorders: Secondary | ICD-10-CM | POA: Diagnosis not present

## 2021-08-19 DIAGNOSIS — E1122 Type 2 diabetes mellitus with diabetic chronic kidney disease: Secondary | ICD-10-CM

## 2021-08-19 DIAGNOSIS — F329 Major depressive disorder, single episode, unspecified: Secondary | ICD-10-CM

## 2021-08-21 ENCOUNTER — Ambulatory Visit: Payer: Medicare Other

## 2021-08-21 ENCOUNTER — Other Ambulatory Visit: Payer: Self-pay

## 2021-08-21 ENCOUNTER — Telehealth: Payer: Medicare Other | Admitting: *Deleted

## 2021-08-21 DIAGNOSIS — R9431 Abnormal electrocardiogram [ECG] [EKG]: Secondary | ICD-10-CM | POA: Diagnosis not present

## 2021-08-21 DIAGNOSIS — Z794 Long term (current) use of insulin: Secondary | ICD-10-CM | POA: Diagnosis not present

## 2021-08-21 DIAGNOSIS — E1165 Type 2 diabetes mellitus with hyperglycemia: Secondary | ICD-10-CM | POA: Diagnosis not present

## 2021-08-21 DIAGNOSIS — I4729 Other ventricular tachycardia: Secondary | ICD-10-CM | POA: Diagnosis not present

## 2021-08-21 DIAGNOSIS — I700 Atherosclerosis of aorta: Secondary | ICD-10-CM

## 2021-08-24 LAB — PCV MYOCARDIAL PERFUSION WITH LEXISCAN: ST Depression (mm): 0 mm

## 2021-08-26 NOTE — Progress Notes (Signed)
Called pt to inform her about her stress test. Pt understood.

## 2021-09-20 ENCOUNTER — Other Ambulatory Visit: Payer: Self-pay

## 2021-09-20 ENCOUNTER — Ambulatory Visit
Admission: RE | Admit: 2021-09-20 | Discharge: 2021-09-20 | Disposition: A | Discharge status: Home / Self Care | Payer: Medicare Other | Source: Ambulatory Visit | Attending: Family Medicine | Admitting: Family Medicine

## 2021-09-20 DIAGNOSIS — Z1231 Encounter for screening mammogram for malignant neoplasm of breast: Secondary | ICD-10-CM | POA: Diagnosis not present

## 2021-09-20 IMAGING — MG MM DIGITAL SCREENING BILAT W/ TOMO AND CAD
6 of 12 series · 6 of 36 positions shown · non-contrast
Comparison: Previous exam(s).

CLINICAL DATA: Screening.

EXAM:
DIGITAL SCREENING BILATERAL MAMMOGRAM WITH TOMOSYNTHESIS AND CAD
TECHNIQUE: Bilateral screening digital craniocaudal and mediolateral oblique
mammograms were obtained. Bilateral screening digital breast
tomosynthesis was performed. The images were evaluated with
computer-aided detection.

[R CC synth-2D]
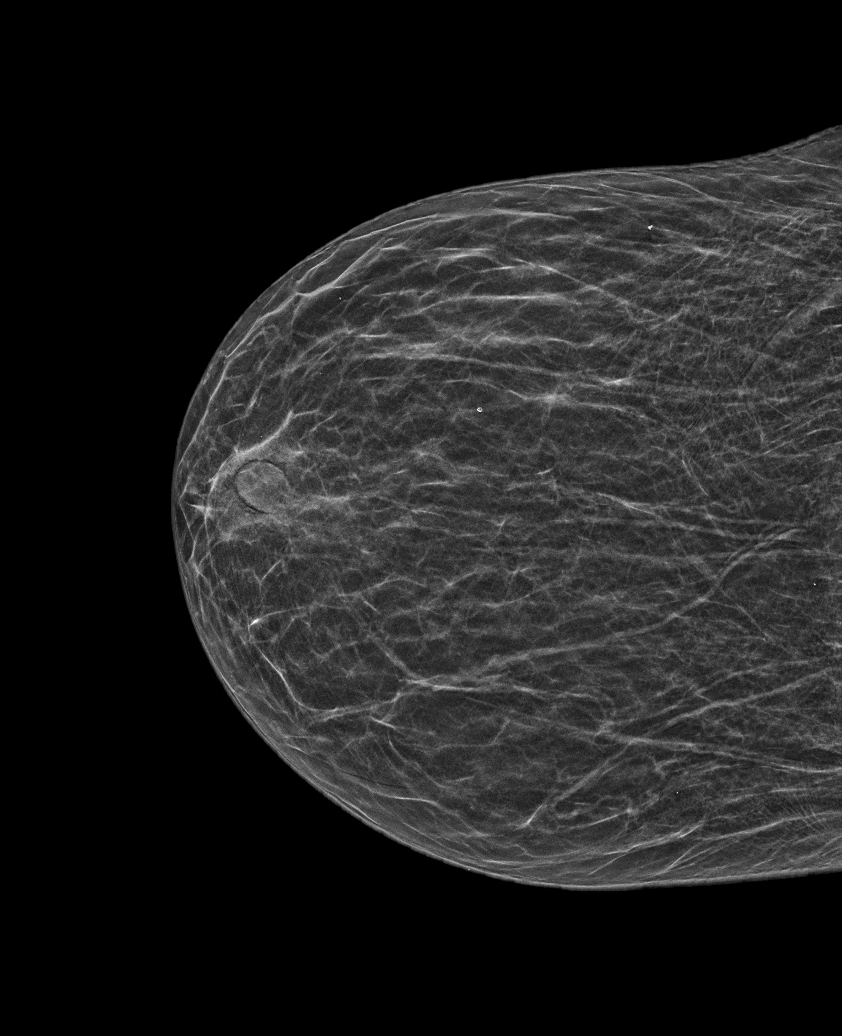

[R MLO synth-2D (1 of 2)]
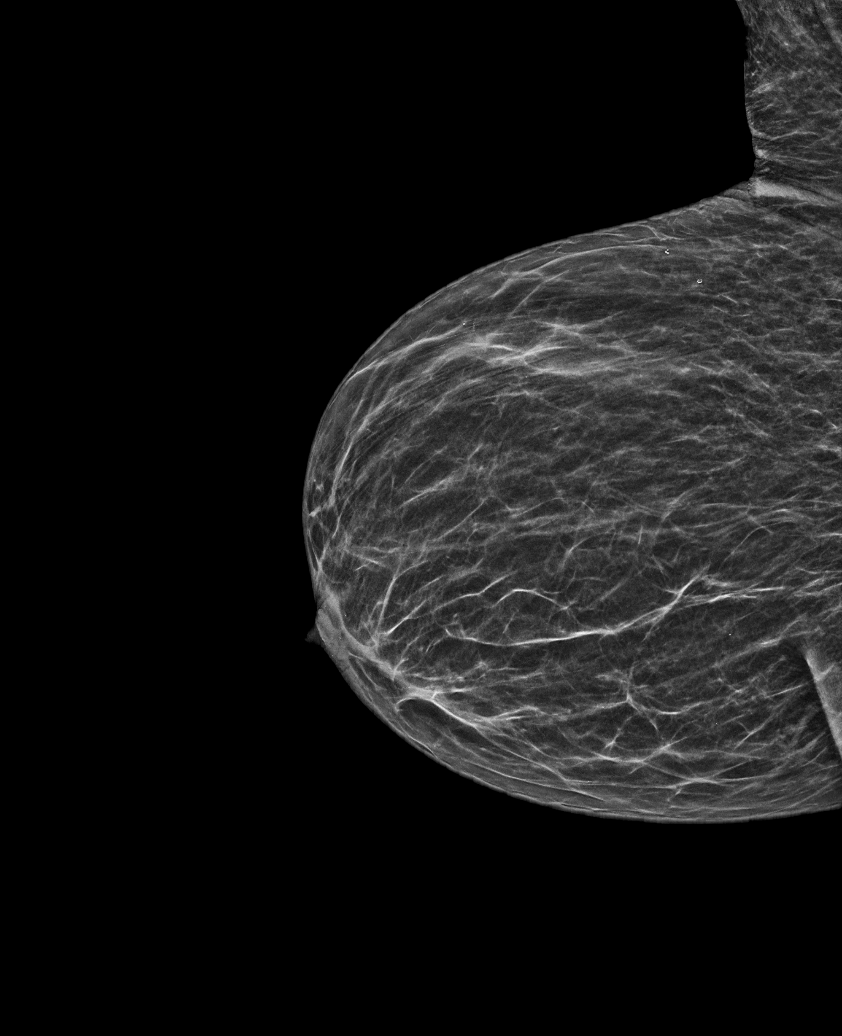

[L MLO synth-2D (1 of 2)]
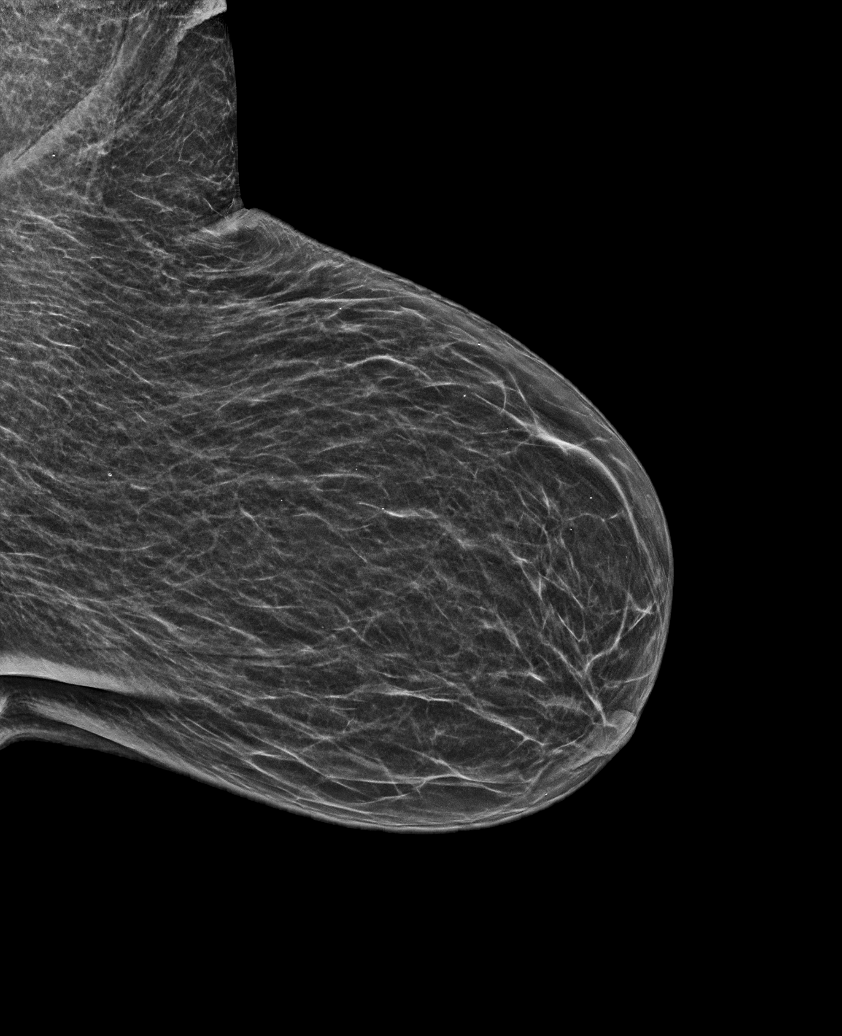

[R MLO synth-2D (2 of 2)]
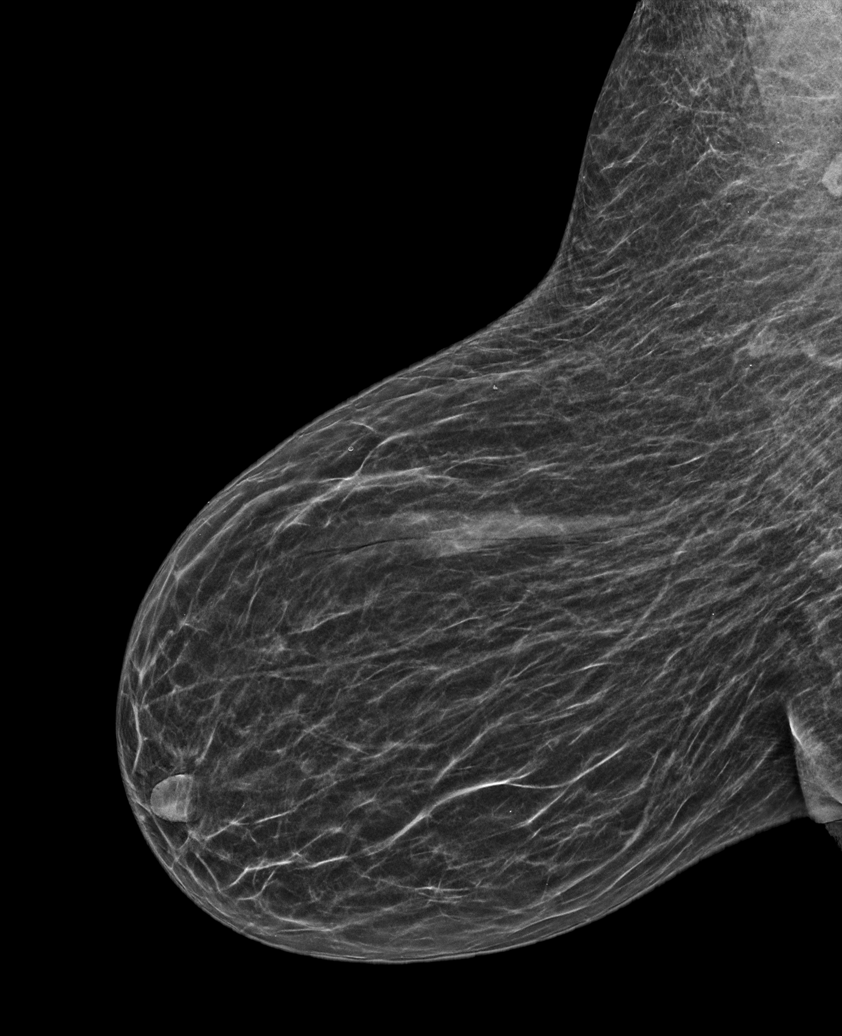

[L MLO synth-2D (2 of 2)]
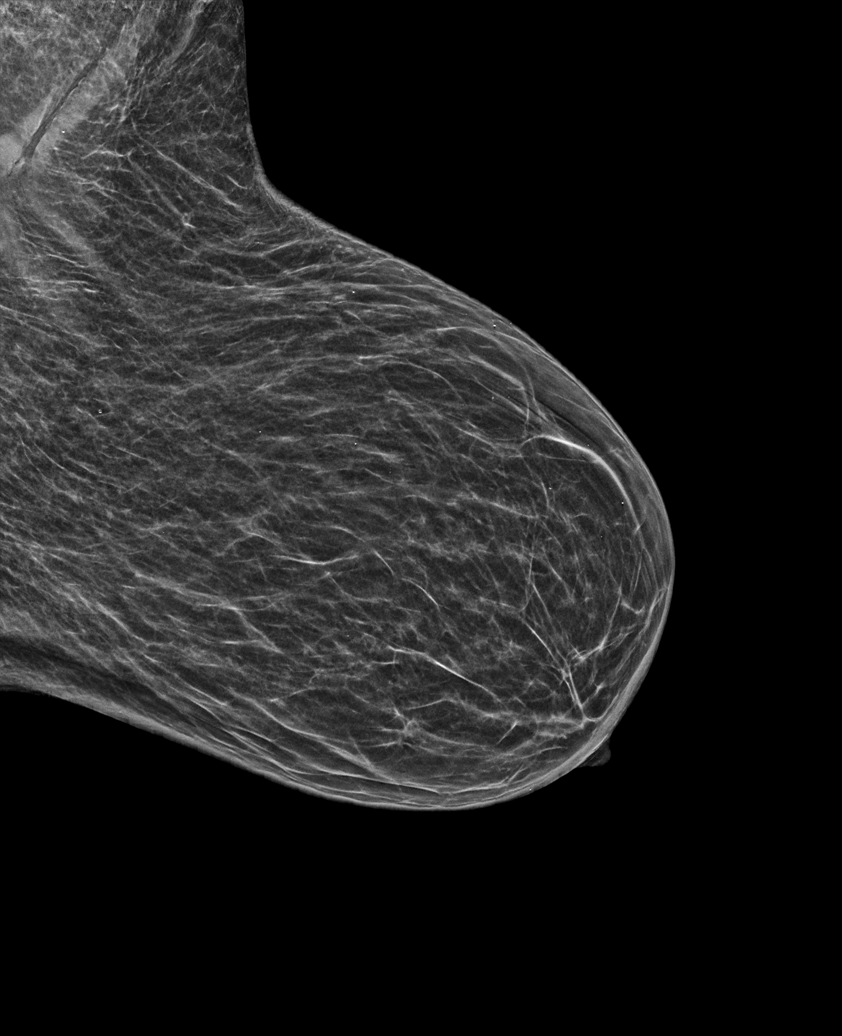

[L CC synth-2D]
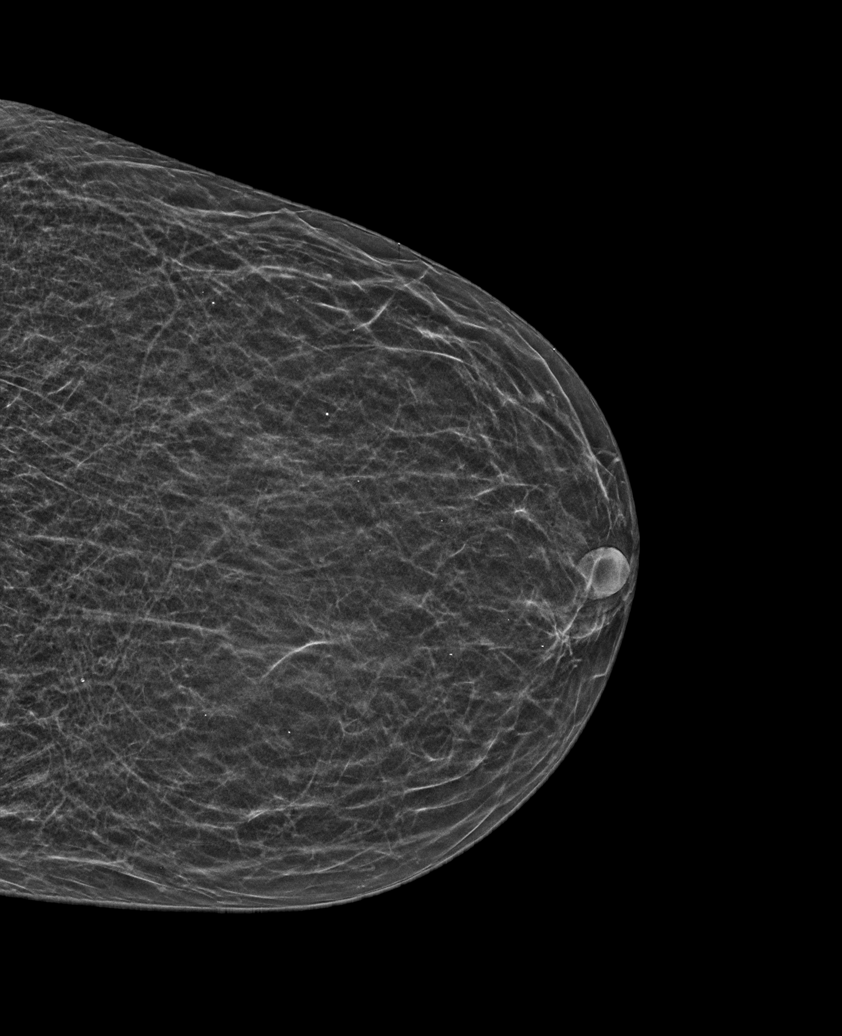

[6 of 36 positions shown; findings below may reference images not displayed]

ACR Breast Density Category b: There are scattered areas of
fibroglandular density.
FINDINGS: There are no findings suspicious for malignancy.
IMPRESSION: No mammographic evidence of malignancy. A result letter of this
screening mammogram will be mailed directly to the patient.

RECOMMENDATION:
Screening mammogram in one year. (Code:[BY])

BI-RADS CATEGORY  1: Negative.

## 2021-09-26 ENCOUNTER — Encounter: Payer: Self-pay | Admitting: Cardiology

## 2021-09-26 ENCOUNTER — Ambulatory Visit: Payer: Medicare Other | Admitting: Cardiology

## 2021-09-26 ENCOUNTER — Other Ambulatory Visit: Payer: Self-pay

## 2021-09-26 VITALS — BP 151/79 | HR 76 | Resp 16 | Ht 61.0 in | Wt 153.0 lb

## 2021-09-26 DIAGNOSIS — I700 Atherosclerosis of aorta: Secondary | ICD-10-CM

## 2021-09-26 DIAGNOSIS — E1169 Type 2 diabetes mellitus with other specified complication: Secondary | ICD-10-CM

## 2021-09-26 DIAGNOSIS — R002 Palpitations: Secondary | ICD-10-CM | POA: Diagnosis not present

## 2021-09-26 DIAGNOSIS — I4729 Other ventricular tachycardia: Secondary | ICD-10-CM | POA: Diagnosis not present

## 2021-09-26 DIAGNOSIS — Z794 Long term (current) use of insulin: Secondary | ICD-10-CM | POA: Diagnosis not present

## 2021-09-26 DIAGNOSIS — I10 Essential (primary) hypertension: Secondary | ICD-10-CM

## 2021-09-26 DIAGNOSIS — E1165 Type 2 diabetes mellitus with hyperglycemia: Secondary | ICD-10-CM | POA: Diagnosis not present

## 2021-09-26 DIAGNOSIS — E785 Hyperlipidemia, unspecified: Secondary | ICD-10-CM | POA: Diagnosis not present

## 2021-09-26 NOTE — Progress Notes (Signed)
ID:  Casey Sanders, DOB 1961-01-08, MRN [id number]  PCP:  [name], DO  Cardiologist:  Rex Kras, DO, [facility] (established care 06/27/2021) Former Cardiology Providers: Dr. Kirk Ruths  Date: 09/26/21 Last Office Visit: 08/08/2021  Chief Complaint  Patient presents with   Palpitations   Results   Follow-up    HPI  Casey Sanders is a 60 y.o. female who presents to the office with a chief complaint of " reevaluation of palpitations and chest pain and discuss test results." Patient's past medical history and cardiovascular risk factors include: Hypertension, insulin-dependent diabetes mellitus type 2, hyperlipidemia, GERD, aortic atherosclerosis.  She is referred to the office at the request of [name], DO for evaluation of palpitations.  Initially referred for evaluation of palpitations.  Underwent an extended Holter monitor which noted no significant dysrhythmias.  Suspected that the palpitations were secondary to dehydration, poor oral intake, stress/anxiety, and episodes of hypoglycemia as she was on insulin therapy despite having poor oral intake.  Patient states that her insulin levels have been reduced she has less episodes of hypoglycemia since last office visit palpitations have essentially resolved.  At last office visit patient was also complaining of symptoms of substernal discomfort traveling up to her throat, burning-like sensation, in the setting of insulin-dependent diabetes, aortic atherosclerosis, NSVT on her extended Holter monitor patient was recommended to undergo ischemic evaluation.  Stress test reported to be low risk study.  Clinically she continues to have similar discomfort and plans to follow-up with gastroenterology.  Patient describes feeling of " food getting caught in her throat."  She has not tried PPIs for possible underlying heartburn/GERD.  FUNCTIONAL STATUS: No structured exercise program or daily routine.     ALLERGIES: Allergies  Allergen Reactions   Codeine Hives, Nausea And Vomiting and Other (See Comments)   Jardiance [Empagliflozin]     Caused blisters   Metformin And Related     nausea   Niaspan [Niacin Er] Other (See Comments)    Increase blood glucose    MEDICATION LIST PRIOR TO VISIT: Current Meds  Medication Sig   Accu-Chek Softclix Lancets lancets Please use to test blood sugar 3 times daily as directed. DX: E11.9   albuterol (VENTOLIN HFA) 108 (90 Base) MCG/ACT inhaler Inhale 1-2 puffs into the lungs every 6 (six) hours as needed for wheezing or shortness of breath.   aspirin 81 MG EC tablet Take 81 mg by mouth daily.   atorvastatin (LIPITOR) 80 MG tablet TAKE 1 TABLET BY MOUTH ONCE DAILY AT  6  PM   Blood Glucose Monitoring Suppl (ACCU-CHEK GUIDE ME) w/Device KIT Please use to test blood sugar 3 times daily as directed. DX: E11.9   diclofenac sodium (VOLTAREN) 1 % GEL Apply 2 g topically 4 (four) times daily.   fish oil-omega-3 fatty acids 1000 MG capsule Take 2 g by mouth daily.   glucose blood (ACCU-CHEK GUIDE) test strip Please use to test blood sugar 3 times daily as directed. DX: E11.9   LANTUS SOLOSTAR 100 UNIT/ML Solostar Pen INJECT 48 UNITS SUBCUTANEOUSLY ONCE DAILY AT 10 PM (Patient taking differently: 30 Units.)   levocetirizine (XYZAL) 5 MG tablet Take 1 tablet (5 mg total) by mouth every evening.   lisinopril (ZESTRIL) 10 MG tablet Take 1 tablet by mouth once daily   TRULICITY 3 PJ/0.9TO SOPN INJECT 1 DOSE SUBCUTANEOUSLY ONCE A WEEK     PAST MEDICAL HISTORY: Past Medical History:  Diagnosis Date   Allergic rhinitis  Anxiety and depression    Asthma    Benign essential HTN 01/04/2011   Chest pain    a. Myoview 9/05: no ischemia, no scar, EF 70%   Chest pain, unspecified 02/12/2011   Cystitis 02/08/2015   Diverticulosis    DM2 (diabetes mellitus, type 2) ([place])    Extremity pain 01/04/2013   Fibromyalgia    Frequent UTI    GERD (gastroesophageal reflux  disease)    Hiatal Hernia   HLD (hyperlipidemia)    HTN (hypertension)    Left rotator cuff tear    Low back pain 01/04/2013   Lumbar disc disease    Nephrolithiasis    Obesity    Palpitations    monitor 9/05: NSR, sinus tachy, PVCs   Pre-operative cardiovascular examination 02/12/2011   Rectal bleeding 08/22/2013   Sepsis secondary to UTI ([place]) 08/09/2017   uterine ca dx'd 1988   per pt    PAST SURGICAL HISTORY: Past Surgical History:  Procedure Laterality Date   ABDOMINAL HYSTERECTOMY     CESAREAN SECTION     ESOPHAGEAL MANOMETRY N/A 06/19/2021   Procedure: ESOPHAGEAL MANOMETRY (EM);  Surgeon: Wilford Corner, MD;  Location: [facility];  Service: Endoscopy;  Laterality: N/A;   FOOT SURGERY     right wrist surgery     ROTATOR CUFF REPAIR Left 2009   tonsillectomy     TOTAL ABDOMINAL HYSTERECTOMY W/ BILATERAL SALPINGOOPHORECTOMY      FAMILY HISTORY: The patient family history includes Coronary artery disease in her father and mother; Heart attack (age of onset: 43) in her brother; Heart disease in her brother, brother, and brother; Lung cancer (age of onset: 50) in her brother.  SOCIAL HISTORY:  The patient  reports that she has never smoked. She has never used smokeless tobacco. She reports that she does not drink alcohol and does not use drugs.  REVIEW OF SYSTEMS: Review of Systems  Constitutional: Negative for chills and fever.  HENT:  Negative for hoarse voice and nosebleeds.   Eyes:  Negative for discharge, double vision and pain.  Cardiovascular:  Positive for palpitations (improved). Negative for chest pain, claudication, dyspnea on exertion, leg swelling, near-syncope, orthopnea, paroxysmal nocturnal dyspnea and syncope.  Respiratory:  Negative for hemoptysis and shortness of breath.   Musculoskeletal:  Negative for muscle cramps and myalgias.  Gastrointestinal:  Positive for dysphagia. Negative for abdominal pain, constipation, diarrhea, hematemesis,  hematochezia, melena, nausea and vomiting.  Neurological:  Negative for dizziness and light-headedness.   PHYSICAL EXAM: Vitals with BMI 09/26/2021 08/08/2021 08/08/2021  Height '5\' 1"'  - '5\' 1"'   Weight 153 lbs - 153 lbs 13 oz  BMI 50.03 - 70.48  Systolic 889 169 450  Diastolic 79 79 84  Pulse 76 77 86    CONSTITUTIONAL: Well-developed and well-nourished. No acute distress.  SKIN: Skin is warm and dry. No rash noted. No cyanosis. No pallor. No jaundice HEAD: Normocephalic and atraumatic.  EYES: No scleral icterus MOUTH/THROAT: Moist oral membranes.  NECK: No JVD present. No thyromegaly noted. No carotid bruits  LYMPHATIC: No visible cervical adenopathy.  CHEST Normal respiratory effort. No intercostal retractions  LUNGS: Clear to auscultation bilaterally.  No stridor. No wheezes. No rales.  CARDIOVASCULAR: Regular rate and rhythm, positive S1-S2, no murmurs rubs or gallops appreciated ABDOMINAL: Soft, nontender, nondistended, positive bowel sounds in all 4 quadrants, no apparent ascites.  EXTREMITIES: No peripheral edema  HEMATOLOGIC: No significant bruising NEUROLOGIC: Oriented to person, place, and time. Nonfocal. Normal muscle tone.  PSYCHIATRIC: Normal  mood and affect. Normal behavior. Cooperative  CARDIAC DATABASE: EKG: 06/27/2021: Normal sinus rhythm, 82 bpm, poor R wave progression, without underlying ischemia or injury pattern  Echocardiogram: 07/11/2021: Normal LV systolic function with visual EF 60-65%. Left ventricle cavity is normal in size. Normal left ventricular wall thickness. Normal global wall motion. Normal diastolic filling pattern, normal LAP.  Trace tricuspid regurgitation. No evidence of pulmonary hypertension. No prior study for comparison.  Lexiscan Tetrofosmin stress test 08/21/2021: Lexiscan nuclear stress test performed using 1-day protocol. Normal myocardial perfusion. Stress LVEF 58%. Low risk study.  14 day extended Holter monitor: Patch Wear Time:   10 days and 19 hours  Dominant rhythm normal sinus. Heart rate 50-164 bpm.  Avg HR 76 bpm. No atrial fibrillation, high grade AV block, pauses (3 seconds or longer). Total ventricular ectopic burden <1%. Total supraventricular ectopic burden <1%. 1 episode of NSVT, 4 beats in duration, maximum rate of 164 bpm. 1 episode of SVT, 5 beats in duration, maximum rate of 154 bpm. Patient triggered events: 4.  Finding rhythm normal sinus with occasional PVCs.   LABORATORY DATA: CBC Latest Ref Rng & Units 01/15/2021 01/27/2020 08/20/2017  WBC 3.4 - 10.8 x10E3/uL 8.8 7.1 6.5  Hemoglobin 11.1 - 15.9 g/dL 15.6 14.9 11.6  Hematocrit 34.0 - 46.6 % 44.8 44.6 34.7  Platelets 150 - 450 x10E3/uL 249 226 396(H)    CMP Latest Ref Rng & Units 07/04/2021 02/19/2021 01/15/2021  Glucose 65 - 99 mg/dL 136(H) - 161(H)  BUN 8 - 27 mg/dL 15 - 12  Creatinine 0.57 - 1.00 mg/dL 0.71 - 0.83  Sodium 134 - 144 mmol/L 139 - 143  Potassium 3.5 - 5.2 mmol/L 4.6 - 3.5  Chloride 96 - 106 mmol/L 99 - 99  CO2 20 - 29 mmol/L 25 - 19(L)  Calcium 8.7 - 10.3 mg/dL 10.3 10.5(H) 10.5(H)  Total Protein 6.0 - 8.5 g/dL 7.2 - 7.7  Total Bilirubin 0.0 - 1.2 mg/dL 0.7 - 0.7  Alkaline Phos 44 - 121 IU/L 62 - 57  AST 0 - 40 IU/L 17 - 17  ALT 0 - 32 IU/L 24 - 10    Lipid Panel  Lab Results  Component Value Date   CHOL 123 07/04/2021   HDL 53 07/04/2021   LDLCALC 55 07/04/2021   TRIG 76 07/04/2021   CHOLHDL 2.3 07/04/2021    No components found for: NTPROBNP No results for input(s): PROBNP in the last 8760 hours. Recent Labs    11/28/20 1409 01/15/21 1119 07/04/21 0952  TSH 5.380* 4.460 4.170    BMP Recent Labs    11/28/20 1409 01/15/21 1119 02/19/21 0937 07/04/21 0952  NA 141 143  --  139  K 4.4 3.5  --  4.6  CL 101 99  --  99  CO2 25 19*  --  25  GLUCOSE 148* 161*  --  136*  BUN 11 12  --  15  CREATININE 0.83 0.83  --  0.71  CALCIUM 10.4* 10.5* 10.5* 10.3  GFRNONAA 76  --   --   --   GFRAA 88  --   --   --      HEMOGLOBIN A1C Lab Results  Component Value Date   HGBA1C 6.1 (H) 07/04/2021   MPG 166 (H) 07/07/2013    External Labs: 02/08/2021  Sodium 137, potassium 3.5, chloride 101, bicarb 28, BUN 12, creatinine 0.69. AST 18, ALT 16, alkaline phosphatase 42 14.2, hematocrit 40.2%   IMPRESSION:  ICD-10-CM   1. Palpitations  R00.2     2. NSVT (nonsustained ventricular tachycardia)  I47.29     3. Atherosclerosis of aorta (HCC)  I70.0     4. Benign hypertension  I10     5. Type 2 diabetes mellitus with hyperglycemia, with long-term current use of insulin (HCC)  E11.65    Z79.4     6. Long-term insulin use (HCC)  Z79.4     7. Hyperlipidemia associated with type 2 diabetes mellitus (HCC)  E11.69    E78.5         RECOMMENDATIONS: Cheyenna Pankowski is a 60 y.o. female whose past medical history and cardiac risk factors include: Hypertension, insulin-dependent diabetes mellitus type 2, hyperlipidemia, GERD, aortic atherosclerosis.  Palpitations Resolved EKG nonischemic. Echocardiogram notes preserved LVEF. Nuclear stress test: Low risk study TSH within normal limits. No identifiable reversible causes. Extended Holter monitor results reviewed.  NSVT (nonsustained ventricular tachycardia) 1 episode on extended Holter monitor-asymptomatic.   Echocardiogram: Preserved LVEF. Nuclear stress test: Low risk study. Discussed initiating beta-blocker therapy however would like to hold off pharmacological therapy at this time. Continue to monitor longitudinally.    Atherosclerosis of aorta (HCC) Continue aspirin and statin therapy.  Benign hypertension Office blood pressures within acceptable range. Medications reconciled. Echocardiogram will be ordered to evaluate for structural heart disease and left ventricular systolic function.  Type 2 diabetes mellitus with hyperglycemia, with long-term current use of insulin ([place]) Since last visit the dose of insulin has been reduced-per  patient Managed by her other medical providers.  Hyperlipidemia associated with type 2 diabetes mellitus ([place]) Currently on statin therapy. Denies myalgias. Most recent LDL 55 mg/dL, 06/2021.  FINAL MEDICATION LIST END OF ENCOUNTER: No orders of the defined types were placed in this encounter.   There are no discontinued medications.    Current Outpatient Medications:    Accu-Chek Softclix Lancets lancets, Please use to test blood sugar 3 times daily as directed. DX: E11.9, Disp: 100 each, Rfl: 12   albuterol (VENTOLIN HFA) 108 (90 Base) MCG/ACT inhaler, Inhale 1-2 puffs into the lungs every 6 (six) hours as needed for wheezing or shortness of breath., Disp: 8 g, Rfl: 0   aspirin 81 MG EC tablet, Take 81 mg by mouth daily., Disp: , Rfl:    atorvastatin (LIPITOR) 80 MG tablet, TAKE 1 TABLET BY MOUTH ONCE DAILY AT  6  PM, Disp: 90 tablet, Rfl: 0   Blood Glucose Monitoring Suppl (ACCU-CHEK GUIDE ME) w/Device KIT, Please use to test blood sugar 3 times daily as directed. DX: E11.9, Disp: 1 kit, Rfl: 0   diclofenac sodium (VOLTAREN) 1 % GEL, Apply 2 g topically 4 (four) times daily., Disp: 100 g, Rfl: 1   fish oil-omega-3 fatty acids 1000 MG capsule, Take 2 g by mouth daily., Disp: , Rfl:    glucose blood (ACCU-CHEK GUIDE) test strip, Please use to test blood sugar 3 times daily as directed. DX: E11.9, Disp: 100 each, Rfl: 12   LANTUS SOLOSTAR 100 UNIT/ML Solostar Pen, INJECT 48 UNITS SUBCUTANEOUSLY ONCE DAILY AT 10 PM (Patient taking differently: 30 Units.), Disp: 15 mL, Rfl: 0   levocetirizine (XYZAL) 5 MG tablet, Take 1 tablet (5 mg total) by mouth every evening., Disp: 30 tablet, Rfl: 0   lisinopril (ZESTRIL) 10 MG tablet, Take 1 tablet by mouth once daily, Disp: 90 tablet, Rfl: 1   TRULICITY 3 JZ/7.9XT SOPN, INJECT 1 DOSE SUBCUTANEOUSLY ONCE A WEEK, Disp: 12 mL, Rfl: 0  No  orders of the defined types were placed in this encounter.  There are no Patient Instructions on file for this  visit.   --Continue cardiac medications as reconciled in final medication list. --Return in about 1 year (around 09/26/2022) for Follow up. Or sooner if needed. --Continue follow-up with your primary care physician regarding the management of your other chronic comorbid conditions.  Patient's questions and concerns were addressed to her satisfaction. She voices understanding of the instructions provided during this encounter.   This note was created using a voice recognition software as a result there may be grammatical errors inadvertently enclosed that do not reflect the nature of this encounter. Every attempt is made to correct such errors.  Rex Kras, [place], [facility]  Pager: [phone] Office: [phone]

## 2021-10-08 ENCOUNTER — Ambulatory Visit (INDEPENDENT_AMBULATORY_CARE_PROVIDER_SITE_OTHER): Payer: Medicare Other | Admitting: Licensed Clinical Social Worker

## 2021-10-08 DIAGNOSIS — F411 Generalized anxiety disorder: Secondary | ICD-10-CM

## 2021-10-08 DIAGNOSIS — C539 Malignant neoplasm of cervix uteri, unspecified: Secondary | ICD-10-CM

## 2021-10-08 DIAGNOSIS — E1169 Type 2 diabetes mellitus with other specified complication: Secondary | ICD-10-CM

## 2021-10-08 DIAGNOSIS — E1159 Type 2 diabetes mellitus with other circulatory complications: Secondary | ICD-10-CM

## 2021-10-08 DIAGNOSIS — N183 Chronic kidney disease, stage 3 unspecified: Secondary | ICD-10-CM

## 2021-10-08 DIAGNOSIS — F329 Major depressive disorder, single episode, unspecified: Secondary | ICD-10-CM

## 2021-10-08 NOTE — Patient Instructions (Addendum)
Visit Information  Patient Goals:  Protect My Health. Manage Anxiety issues. Manage Depression issues  Timeframe:  Short-Term Goal Priority:  Medium Progress: On Track Start Date:    10/08/21                          Expected End Date:  01/03/22               Follow Up Date   12/04/21 at 9:00 AM  Protect My Health; Manage Anxiety issues; Manage Depression issues  Patient Self Care Activities:  Self administers medications as prescribed  Patient Coping Strengths:  Family Friends  Patient Self Care Deficits:  Anxiety issues Depression issues  Patient Goals:  - spend time or talk with others at least 2 to 3 times per week - practice relaxation or meditation daily - keep a calendar with appointment dates  Follow Up Plan: LCSW to call client on 12/04/21 at 9:00 AM to assess client needs   Norva Riffle.Danyelle Brookover MSW, LCSW Licensed Clinical Social Worker [facility] Care Management [phone]

## 2021-10-08 NOTE — Chronic Care Management (AMB) (Signed)
Chronic Care Management    Clinical Social Work Note  10/08/2021 Name: Casey Sanders MRN: [id number] DOB: Feb 12, 1961  Casey Sanders is a 60 y.o. year old female who is a primary care patient of [name], DO. The CCM team was consulted to assist the patient with chronic disease management and/or care coordination needs related to: [employer] .   Engaged with patient by telephone for follow up visit in response to provider referral for social work chronic care management and care coordination services.   Consent to Services:  The patient was given information about Chronic Care Management services, agreed to services, and gave verbal consent prior to initiation of services.  Please see initial visit note for detailed documentation.   Patient agreed to services and consent obtained.   Assessment: Review of patient past medical history, allergies, medications, and health status, including review of relevant consultants reports was performed today as part of a comprehensive evaluation and provision of chronic care management and care coordination services.     SDOH (Social Determinants of Health) assessments and interventions performed:  SDOH Interventions    Flowsheet Row Most Recent Value  SDOH Interventions   Physical Activity Interventions Other (Comments)  [walking challenges.uses a walker to help her ambulalte]  Stress Interventions Provide Counseling  [client has stress related to managing her medical needs]  Depression Interventions/Treatment  Currently on Treatment        Advanced Directives Status: See Vyn[employer] application for related entries.  CCM Care Plan  Allergies  Allergen Reactions   Codeine Hives, Nausea And Vomiting and Other (See Comments)   Jardiance [Empagliflozin]     Caused blisters   Metformin And Related     nausea   Niaspan [Niacin Er] Other (See Comments)    Increase blood glucose    Outpatient Encounter Medications as of 10/08/2021   Medication Sig   Accu-Chek Softclix Lancets lancets Please use to test blood sugar 3 times daily as directed. DX: E11.9   albuterol (VENTOLIN HFA) 108 (90 Base) MCG/ACT inhaler Inhale 1-2 puffs into the lungs every 6 (six) hours as needed for wheezing or shortness of breath.   aspirin 81 MG EC tablet Take 81 mg by mouth daily.   atorvastatin (LIPITOR) 80 MG tablet TAKE 1 TABLET BY MOUTH ONCE DAILY AT  6  PM   Blood Glucose Monitoring Suppl (ACCU-CHEK GUIDE ME) w/Device KIT Please use to test blood sugar 3 times daily as directed. DX: E11.9   diclofenac sodium (VOLTAREN) 1 % GEL Apply 2 g topically 4 (four) times daily.   fish oil-omega-3 fatty acids 1000 MG capsule Take 2 g by mouth daily.   glucose blood (ACCU-CHEK GUIDE) test strip Please use to test blood sugar 3 times daily as directed. DX: E11.9   LANTUS SOLOSTAR 100 UNIT/ML Solostar Pen INJECT 48 UNITS SUBCUTANEOUSLY ONCE DAILY AT 10 PM (Patient taking differently: 30 Units.)   levocetirizine (XYZAL) 5 MG tablet Take 1 tablet (5 mg total) by mouth every evening.   lisinopril (ZESTRIL) 10 MG tablet Take 1 tablet by mouth once daily   TRULICITY 3 BM/8.4XL SOPN INJECT 1 DOSE SUBCUTANEOUSLY ONCE A WEEK   No facility-administered encounter medications on file as of 10/08/2021.    Patient Active Problem List   Diagnosis Date Noted   Hypertensive disorder 12/14/2018   Asthma 03/09/2017   Vitamin D deficiency 03/09/2017   Generalized anxiety disorder 05/23/2016   Gastroesophageal reflux disease 05/23/2016   Depressive disorder 05/23/2016   Fibromyalgia  05/23/2016   Family history of cardiac disorder 05/23/2016   Diabetic neuropathy ([place]) 12/19/2013   Neuropathy due to type 2 diabetes mellitus ([place]) 03/17/2013   Degeneration of lumbar intervertebral disc 01/04/2013   Chronic pain syndrome 01/04/2013   Arthropathy of lumbar facet joint 01/04/2013   Syncope 02/12/2011   Diabetes mellitus ([place]) 01/04/2011   Chronic pain 01/04/2011    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 01/04/2011   Hyperlipidemia associated with type 2 diabetes mellitus ([place]) 01/04/2011   Restless legs 01/04/2011   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[place]) 01/04/2011   Palpitations 01/04/2011    Conditions to be addressed/monitored: monitor client management of anxiety and depression issues  Care Plan : LCSW Care Plan  Updates made by Katha Cabal, LCSW since 10/08/2021 12:00 AM     Problem: Emotional Distress      Goal: Emotional Health Supported;Manage Anxiety issues; Manage Depression issues   Start Date: 10/08/2021  Expected End Date: 01/03/2022  This Visit's Progress: On track  Recent Progress: On track  Priority: Medium  Note:   Current Barriers:  Chronic Mental Health needs related to anxiety issues and depression issues Mobility challenges Suicidal Ideation/Homicidal Ideation: No Weight loss   Clinical Social Work Goal(s):  patient will work with SW monthly by telephone or in person to reduce or manage symptoms related to anxiety issues and depression issues patient will work with SW monthly to address concerns related to mobility of client and related to client completion of ADLs Client will communicate as needed in next 30 days with RNCM to discuss nursing needs of client  Interventions: 1:1 collaboration with [name], DO regarding development and update of comprehensive plan of care as evidenced by provider attestation and co-signature Discussed with client pain issues of client and sleeping challenges of client.  Reviewed with client the swallowing challenges of client Reviewed with client the energy level of client. She said she has reduced energy level. Discussed mood status with client. She said her mood was stable. She did not mention any mood problems.  Reviewed mobility challenges of client. She said she uses a walker to help her ambulate Discussed with client her  family support (grandson lives with her and is helpful to client; daughters are supportive and both daughters live nearby) Reviewed client use of supplements. She said she has not been using as many supplements recently Discussed vision of client. Client has new pair of glasses. Reviewed appetite of client. She eats small amounts of food. She said she eats about 4 small meals daily Client said she continues to have difficulty swallowing and has occasional stomach pain.  Patient Self Care Activities:  Self administers medications as prescribed  Patient Coping Strengths:  Family Friends  Patient Self Care Deficits:  Anxiety issues Depression issues  Patient Goals:  - spend time or talk with others at least 2 to 3 times per week - practice relaxation or meditation daily - keep a calendar with appointment dates  Follow Up Plan: LCSW to call client on 12/04/21 at 9:00 AM to assess client needs      Norva Riffle.Nike Southwell MSW, LCSW Licensed Clinical Social Worker [facility] Care Management [phone]

## 2021-10-19 DIAGNOSIS — E1169 Type 2 diabetes mellitus with other specified complication: Secondary | ICD-10-CM

## 2021-10-19 DIAGNOSIS — F329 Major depressive disorder, single episode, unspecified: Secondary | ICD-10-CM

## 2021-10-19 DIAGNOSIS — N183 Chronic kidney disease, stage 3 unspecified: Secondary | ICD-10-CM

## 2021-10-19 DIAGNOSIS — E785 Hyperlipidemia, unspecified: Secondary | ICD-10-CM

## 2021-10-19 DIAGNOSIS — E1159 Type 2 diabetes mellitus with other circulatory complications: Secondary | ICD-10-CM

## 2021-10-19 DIAGNOSIS — I152 Hypertension secondary to endocrine disorders: Secondary | ICD-10-CM

## 2021-10-19 DIAGNOSIS — E1122 Type 2 diabetes mellitus with diabetic chronic kidney disease: Secondary | ICD-10-CM

## 2021-10-23 ENCOUNTER — Ambulatory Visit (INDEPENDENT_AMBULATORY_CARE_PROVIDER_SITE_OTHER): Payer: Medicare Other

## 2021-10-23 VITALS — Ht 61.0 in | Wt 153.0 lb

## 2021-10-23 DIAGNOSIS — K921 Melena: Secondary | ICD-10-CM | POA: Insufficient documentation

## 2021-10-23 DIAGNOSIS — K2930 Chronic superficial gastritis without bleeding: Secondary | ICD-10-CM | POA: Insufficient documentation

## 2021-10-23 DIAGNOSIS — K5730 Diverticulosis of large intestine without perforation or abscess without bleeding: Secondary | ICD-10-CM | POA: Insufficient documentation

## 2021-10-23 DIAGNOSIS — R1310 Dysphagia, unspecified: Secondary | ICD-10-CM | POA: Insufficient documentation

## 2021-10-23 DIAGNOSIS — K5900 Constipation, unspecified: Secondary | ICD-10-CM | POA: Insufficient documentation

## 2021-10-23 DIAGNOSIS — R9389 Abnormal findings on diagnostic imaging of other specified body structures: Secondary | ICD-10-CM | POA: Insufficient documentation

## 2021-10-23 DIAGNOSIS — Z0000 Encounter for general adult medical examination without abnormal findings: Secondary | ICD-10-CM

## 2021-10-23 DIAGNOSIS — K5901 Slow transit constipation: Secondary | ICD-10-CM | POA: Insufficient documentation

## 2021-10-23 NOTE — Patient Instructions (Signed)
Casey Sanders , Thank you for taking time to come for your Medicare Wellness Visit. I appreciate your ongoing commitment to your health goals. Please review the following plan we discussed and let me know if I can assist you in the future.   Screening recommendations/referrals: Colonoscopy: Follow up your Gastroenterologist to schedule. Mammogram: Done 09/20/2021. Repeat annually  Bone Density: Due at age 61.  Recommended yearly ophthalmology/optometry visit for glaucoma screening and checkup Recommended yearly dental visit for hygiene and checkup  Vaccinations: Influenza vaccine: Declined. Repeat annually  Pneumococcal vaccine: Done 09/07/2014 and 07/15/2016 Tdap vaccine: Done 10/20/2002 Repeat in 10 years  Shingles vaccine: Shingrix discussed. Please contact your pharmacy for coverage information.    Covid-19: Shingrix discussed. Please contact your pharmacy for coverage information.    Advanced directives: Advance directive discussed with you today. Even though you declined this today, please call our office should you change your mind, and we can give you the proper paperwork for you to fill out.   Conditions/risks identified: Aim for 30 minutes of exercise each day, drink 6-8 glasses of water and eat lots of fruits and vegetables.   Next appointment: Follow up in one year for your annual wellness visit. 10/27/2022 @ 9:45 am.   Preventive Care 40-64 Years, Female Preventive care refers to lifestyle choices and visits with your health care provider that can promote health and wellness. What does preventive care include? A yearly physical exam. This is also called an annual well check. Dental exams once or twice a year. Routine eye exams. Ask your health care provider how often you should have your eyes checked. Personal lifestyle choices, including: Daily care of your teeth and gums. Regular physical activity. Eating a healthy diet. Avoiding tobacco and drug use. Limiting alcohol  use. Practicing safe sex. Taking low-dose aspirin daily starting at age 4. Taking vitamin and mineral supplements as recommended by your health care provider. What happens during an annual well check? The services and screenings done by your health care provider during your annual well check will depend on your age, overall health, lifestyle risk factors, and family history of disease. Counseling  Your health care provider may ask you questions about your: Alcohol use. Tobacco use. Drug use. Emotional well-being. Home and relationship well-being. Sexual activity. Eating habits. Work and work Statistician. Method of birth control. Menstrual cycle. Pregnancy history. Screening  You may have the following tests or measurements: Height, weight, and BMI. Blood pressure. Lipid and cholesterol levels. These may be checked every 5 years, or more frequently if you are over 7 years old. Skin check. Lung cancer screening. You may have this screening every year starting at age 39 if you have a 30-pack-year history of smoking and currently smoke or have quit within the past 15 years. Fecal occult blood test (FOBT) of the stool. You may have this test every year starting at age 60. Flexible sigmoidoscopy or colonoscopy. You may have a sigmoidoscopy every 5 years or a colonoscopy every 10 years starting at age 39. Hepatitis C blood test. Hepatitis B blood test. Sexually transmitted disease (STD) testing. Diabetes screening. This is done by checking your blood sugar (glucose) after you have not eaten for a while (fasting). You may have this done every 1-3 years. Mammogram. This may be done every 1-2 years. Talk to your health care provider about when you should start having regular mammograms. This may depend on whether you have a family history of breast cancer. BRCA-related cancer screening. This may be  done if you have a family history of breast, ovarian, tubal, or peritoneal cancers. Pelvic  exam and Pap test. This may be done every 3 years starting at age 42. Starting at age 61, this may be done every 5 years if you have a Pap test in combination with an HPV test. Bone density scan. This is done to screen for osteoporosis. You may have this scan if you are at high risk for osteoporosis. Discuss your test results, treatment options, and if necessary, the need for more tests with your health care provider. Vaccines  Your health care provider may recommend certain vaccines, such as: Influenza vaccine. This is recommended every year. Tetanus, diphtheria, and acellular pertussis (Tdap, Td) vaccine. You may need a Td booster every 10 years. Zoster vaccine. You may need this after age 9. Pneumococcal 13-valent conjugate (PCV13) vaccine. You may need this if you have certain conditions and were not previously vaccinated. Pneumococcal polysaccharide (PPSV23) vaccine. You may need one or two doses if you smoke cigarettes or if you have certain conditions. Talk to your health care provider about which screenings and vaccines you need and how often you need them. This information is not intended to replace advice given to you by your health care provider. Make sure you discuss any questions you have with your health care provider. Document Released: 11/02/2015 Document Revised: 06/25/2016 Document Reviewed: 08/07/2015 Elsevier Interactive Patient Education  2017 [employer] Prevention in the Home Falls can cause injuries. They can happen to people of all ages. There are many things you can do to make your home safe and to help prevent falls. What can I do on the outside of my home? Regularly fix the edges of walkways and driveways and fix any cracks. Remove anything that might make you trip as you walk through a door, such as a raised step or threshold. Trim any bushes or trees on the path to your home. Use bright outdoor lighting. Clear any walking paths of anything that might  make someone trip, such as rocks or tools. Regularly check to see if handrails are loose or broken. Make sure that both sides of any steps have handrails. Any raised decks and porches should have guardrails on the edges. Have any leaves, snow, or ice cleared regularly. Use sand or salt on walking paths during winter. Clean up any spills in your garage right away. This includes oil or grease spills. What can I do in the bathroom? Use night lights. Install grab bars by the toilet and in the tub and shower. Do not use towel bars as grab bars. Use non-skid mats or decals in the tub or shower. If you need to sit down in the shower, use a plastic, non-slip stool. Keep the floor dry. Clean up any water that spills on the floor as soon as it happens. Remove soap buildup in the tub or shower regularly. Attach bath mats securely with double-sided non-slip rug tape. Do not have throw rugs and other things on the floor that can make you trip. What can I do in the bedroom? Use night lights. Make sure that you have a light by your bed that is easy to reach. Do not use any sheets or blankets that are too big for your bed. They should not hang down onto the floor. Have a firm chair that has side arms. You can use this for support while you get dressed. Do not have throw rugs and other things on  the floor that can make you trip. What can I do in the kitchen? Clean up any spills right away. Avoid walking on wet floors. Keep items that you use a lot in easy-to-reach places. If you need to reach something above you, use a strong step stool that has a grab bar. Keep electrical cords out of the way. Do not use floor polish or wax that makes floors slippery. If you must use wax, use non-skid floor wax. Do not have throw rugs and other things on the floor that can make you trip. What can I do with my stairs? Do not leave any items on the stairs. Make sure that there are handrails on both sides of the stairs  and use them. Fix handrails that are broken or loose. Make sure that handrails are as long as the stairways. Check any carpeting to make sure that it is firmly attached to the stairs. Fix any carpet that is loose or worn. Avoid having throw rugs at the top or bottom of the stairs. If you do have throw rugs, attach them to the floor with carpet tape. Make sure that you have a light switch at the top of the stairs and the bottom of the stairs. If you do not have them, ask someone to add them for you. What else can I do to help prevent falls? Wear shoes that: Do not have high heels. Have rubber bottoms. Are comfortable and fit you well. Are closed at the toe. Do not wear sandals. If you use a stepladder: Make sure that it is fully opened. Do not climb a closed stepladder. Make sure that both sides of the stepladder are locked into place. Ask someone to hold it for you, if possible. Clearly mark and make sure that you can see: Any grab bars or handrails. First and last steps. Where the edge of each step is. Use tools that help you move around (mobility aids) if they are needed. These include: Canes. Walkers. Scooters. Crutches. Turn on the lights when you go into a dark area. Replace any light bulbs as soon as they burn out. Set up your furniture so you have a clear path. Avoid moving your furniture around. If any of your floors are uneven, fix them. If there are any pets around you, be aware of where they are. Review your medicines with your doctor. Some medicines can make you feel dizzy. This can increase your chance of falling. Ask your doctor what other things that you can do to help prevent falls. This information is not intended to replace advice given to you by your health care provider. Make sure you discuss any questions you have with your health care provider. Document Released: 08/02/2009 Document Revised: 03/13/2016 Document Reviewed: 11/10/2014 Elsevier Interactive Patient  Education  2017 [employer].

## 2021-10-23 NOTE — Progress Notes (Signed)
Subjective:   Kiley Solimine is a 61 y.o. female who presents for an Initial Medicare Annual Wellness Visit. Virtual Visit via Telephone Note  I connected with  Makina Skow on 10/23/21 at  9:45 AM EST by telephone and verified that I am speaking with the correct person using two identifiers.  Location: Patient: Home Provider: [facility] Persons participating in the virtual visit: patient/Nurse Health Advisor   I discussed the limitations, risks, security and privacy concerns of performing an evaluation and management service by telephone and the availability of in person appointments. The patient expressed understanding and agreed to proceed.  Interactive audio and video telecommunications were attempted between this nurse and patient, however failed, due to patient having technical difficulties OR patient did not have access to video capability.  We continued and completed visit with audio only.  Some vital signs may be absent or patient reported.   Chriss Driver, LPN  Review of Systems     Cardiac Risk Factors include: hypertension;diabetes mellitus;sedentary lifestyle    PHONE VISIT. PT AT HOME. NURSE AT [facility]. Objective:    Today's Vitals   10/23/21 0934 10/23/21 0945  Weight: 153 lb (69.4 kg)   Height: '5\' 1"'  (1.549 m)   PainSc:  5    Body mass index is 28.91 kg/m.  Advanced Directives 10/23/2021 08/10/2017 08/09/2017 07/07/2013  Does Patient Have a Medical Advance Directive? No No No Patient does not have advance directive;Patient would not like information  Would patient like information on creating a medical advance directive? No - Patient declined No - Patient declined No - Patient declined -  Pre-existing out of facility DNR order (yellow form or pink MOST form) - - - No    Current Medications (verified) Outpatient Encounter Medications as of 10/23/2021  Medication Sig   Accu-Chek Softclix Lancets lancets Please use to test blood sugar 3 times daily as directed. DX:  E11.9   albuterol (VENTOLIN HFA) 108 (90 Base) MCG/ACT inhaler Inhale 1-2 puffs into the lungs every 6 (six) hours as needed for wheezing or shortness of breath.   aspirin 81 MG EC tablet Take 81 mg by mouth daily.   atorvastatin (LIPITOR) 80 MG tablet TAKE 1 TABLET BY MOUTH ONCE DAILY AT  6  PM   Blood Glucose Monitoring Suppl (ACCU-CHEK GUIDE ME) w/Device KIT Please use to test blood sugar 3 times daily as directed. DX: E11.9   diclofenac sodium (VOLTAREN) 1 % GEL Apply 2 g topically 4 (four) times daily.   fish oil-omega-3 fatty acids 1000 MG capsule Take 2 g by mouth daily.   glucose blood (ACCU-CHEK GUIDE) test strip Please use to test blood sugar 3 times daily as directed. DX: E11.9   LANTUS SOLOSTAR 100 UNIT/ML Solostar Pen INJECT 48 UNITS SUBCUTANEOUSLY ONCE DAILY AT 10 PM (Patient taking differently: 30 Units.)   levocetirizine (XYZAL) 5 MG tablet Take 1 tablet (5 mg total) by mouth every evening.   lisinopril (ZESTRIL) 10 MG tablet Take 1 tablet by mouth once daily   sucralfate (CARAFATE) 1 g tablet Take 1 g by mouth 4 (four) times daily.   TRULICITY 3 TD/9.7CB SOPN INJECT 1 DOSE SUBCUTANEOUSLY ONCE A WEEK   No facility-administered encounter medications on file as of 10/23/2021.    Allergies (verified) Codeine, Jardiance [empagliflozin], Metformin and related, and Niaspan [niacin er]   History: Past Medical History:  Diagnosis Date   Allergic rhinitis    Anxiety and depression    Asthma    Benign  essential HTN 01/04/2011   Chest pain    a. Myoview 9/05: no ischemia, no scar, EF 70%   Chest pain, unspecified 02/12/2011   Cystitis 02/08/2015   Diverticulosis    DM2 (diabetes mellitus, type 2) ([place])    Extremity pain 01/04/2013   Fibromyalgia    Frequent UTI    GERD (gastroesophageal reflux disease)    Hiatal Hernia   HLD (hyperlipidemia)    HTN (hypertension)    Left rotator cuff tear    Low back pain 01/04/2013   Lumbar disc disease    Nephrolithiasis    Obesity     Palpitations    monitor 9/05: NSR, sinus tachy, PVCs   Pre-operative cardiovascular examination 02/12/2011   Rectal bleeding 08/22/2013   Sepsis secondary to UTI ([place]) 08/09/2017   uterine ca dx'd 1988   per pt   Past Surgical History:  Procedure Laterality Date   ABDOMINAL HYSTERECTOMY     CESAREAN SECTION     ESOPHAGEAL MANOMETRY N/A 06/19/2021   Procedure: ESOPHAGEAL MANOMETRY (EM);  Surgeon: Wilford Corner, MD;  Location: [facility];  Service: Endoscopy;  Laterality: N/A;   FOOT SURGERY     right wrist surgery     ROTATOR CUFF REPAIR Left January 09, 2008   tonsillectomy     TOTAL ABDOMINAL HYSTERECTOMY W/ BILATERAL SALPINGOOPHORECTOMY     Family History  Problem Relation Age of Onset   Coronary artery disease Mother    Coronary artery disease Father    Heart disease Brother    Heart disease Brother    Lung cancer Brother 66   Heart attack Brother 17       had a few heart attacks before   Heart disease Brother    Social History   Socioeconomic History   Marital status: Widowed    Spouse name: Not on file   Number of children: 2   Years of education: Not on file   Highest education level: Not on file  Occupational History   Occupation: disabled  Tobacco Use   Smoking status: Never   Smokeless tobacco: Never  Vaping Use   Vaping Use: Never used  Substance and Sexual Activity   Alcohol use: No   Drug use: No   Sexual activity: Not on file  Other Topics Concern   Not on file  Social History Narrative   2 daughters and 4 grandchildren.   Husband died in January 08, 2018.   Social Determinants of Health   Financial Resource Strain: Low Risk    Difficulty of Paying Living Expenses: Not hard at all  Food Insecurity: No Food Insecurity   Worried About Charity fundraiser in the Last Year: Never true   [place] in the Last Year: Never true  Transportation Needs: No Transportation Needs   Lack of Transportation (Medical): No   Lack of Transportation (Non-Medical): No   Physical Activity: Inactive   Days of Exercise per Week: 0 days   Minutes of Exercise per Session: 0 min  Stress: Stress Concern Present   Feeling of Stress : To some extent  Social Connections: Moderately Integrated   Frequency of Communication with Friends and Family: More than three times a week   Frequency of Social Gatherings with Friends and Family: More than three times a week   Attends Religious Services: More than 4 times per year   Active Member of [employer] or Organizations: Yes   Attends Archivist Meetings: More than 4 times per year  Marital Status: Widowed    Tobacco Counseling Counseling given: Not Answered   Clinical Intake:  Pre-visit preparation completed: Yes  Pain : 0-10 Pain Score: 5  Pain Type: Chronic pain Pain Location: Abdomen Pain Descriptors / Indicators: Burning Pain Onset: More than a month ago Pain Frequency: Intermittent     BMI - recorded: 28.91 Nutritional Status: BMI 25 -29 Overweight Nutritional Risks: Unintentional weight loss (Due to throat issues, currently under treatment.) Diabetes: Yes  How often do you need to have someone help you when you read instructions, pamphlets, or other written materials from your doctor or pharmacy?: 1 - Never  Diabetic?Nutrition Risk Assessment:  Has the patient had any N/V/D within the last 2 months?  Yes  Does the patient have any non-healing wounds?  No  Has the patient had any unintentional weight loss or weight gain?  Yes   Diabetes:  Is the patient diabetic?  Yes  If diabetic, was a CBG obtained today?  No  Did the patient bring in their glucometer from home?  No  Phone visit. How often do you monitor your CBG's? Daily.   Financial Strains and Diabetes Management:  Are you having any financial strains with the device, your supplies or your medication? No .  Does the patient want to be seen by Chronic Care Management for management of their diabetes?  No  Would the patient like  to be referred to a Nutritionist or for Diabetic Management?  No   Diabetic Exams:  Diabetic Eye Exam: Completed 09/2021. Pt has been advised about the importance in completing this exam.  Diabetic Foot Exam: Completed 11/28/2020. Pt has been advised about the importance in completing this exam.   Interpreter Needed?: No  Information entered by :: MJ Jailin Manocchio,LPN   Activities of Daily Living In your present state of health, do you have any difficulty performing the following activities: 10/23/2021  Hearing? N  Vision? N  Difficulty concentrating or making decisions? Y  Comment Since stomach issues have started.  Walking or climbing stairs? N  Dressing or bathing? N  Doing errands, shopping? N  Preparing Food and eating ? N  Using the Toilet? N  In the past six months, have you accidently leaked urine? Y  Do you have problems with loss of bowel control? N  Managing your Medications? N  Managing your Finances? N  Housekeeping or managing your Housekeeping? N  Some recent data might be hidden    Patient Care Team: [name], DO as PCP - General (Family Medicine) Lavera Guise, [facility] (Pharmacist) Ilean China, RN as Case Manager Shea Evans Norva Riffle, LCSW as [place] Management (Licensed Clinical Social Worker)  Indicate any recent [place] you may have received from other than [facility] providers in the past year (date may be approximate).     Assessment:   This is a routine wellness examination for [place].  Hearing/Vision screen Hearing Screening - Comments:: Some hearing issues in L ear. Vision Screening - Comments:: Glasses. [facility].   Dietary issues and exercise activities discussed: Current Exercise Habits: The patient does not participate in regular exercise at present, Exercise limited by: cardiac condition(s);psychological condition(s)   Goals Addressed             This Visit's Progress    Exercise 3x per week (30  min per time)       Try to exercise as tolerated, try chair exercises.       Depression  Screen PHQ 2/9 Scores 10/23/2021 10/08/2021 08/15/2021 02/26/2021 02/19/2021 01/22/2021 01/15/2021  PHQ - 2 Score '2 3 3 3 3 ' 0 0  PHQ- 9 Score '2 12 13 17 18 ' - 0    Fall Risk Fall Risk  10/23/2021 07/04/2021 02/19/2021 01/22/2021 01/15/2021  Falls in the past year? 1 0 '1 1 1  ' Number falls in past yr: 1 0 0 0 1  Injury with Fall? 0 0 '1 1 1  ' Comment Brusing and soreness, no breaks. - - - -  Risk Factor Category  - - - - -  Risk for fall due to : Impaired mobility;History of fall(s);Impaired balance/gait Impaired balance/gait;Impaired mobility History of fall(s) Impaired balance/gait Impaired mobility  Risk for fall due to: Comment - - - - -  Follow up Falls prevention discussed Falls prevention discussed Education provided Falls evaluation completed Falls prevention discussed    FALL RISK PREVENTION PERTAINING TO THE HOME:  Any stairs in or around the home? Yes    If so, are there any without handrails? No  Home free of loose throw rugs in walkways, pet beds, electrical cords, etc? Yes  Adequate lighting in your home to reduce risk of falls? Yes   ASSISTIVE DEVICES UTILIZED TO PREVENT FALLS:  Life alert? No  Use of a cane, walker or w/c? Yes  Grab bars in the bathroom? Yes  Shower chair or bench in shower? Yes  Elevated toilet seat or a handicapped toilet? Yes   TIMED UP AND GO:  Was the test performed? No .  Phone visit.  Cognitive Function:     6CIT Screen 10/23/2021  What Year? 0 points  What month? 0 points  What time? 0 points  Count back from 20 0 points  Months in reverse 0 points  Repeat phrase 4 points  Total Score 4    Immunizations Immunization History  Administered Date(s) Administered   Influenza Whole 10/20/2006   Influenza,inj,Quad PF,6+ Mos 08/22/2013, 09/07/2014, 07/15/2016, 08/20/2017, 07/05/2019   Moderna Sars-Covid-2 Vaccination 03/28/2020, 04/26/2020   Pneumococcal  Conjugate-13 09/07/2014   Pneumococcal Polysaccharide-23 07/15/2016   Td 10/20/2002    TDAP status: Due, Education has been provided regarding the importance of this vaccine. Advised may receive this vaccine at local pharmacy or Health Dept. Aware to provide a copy of the vaccination record if obtained from local pharmacy or Health Dept. Verbalized acceptance and understanding.  Flu Vaccine status: Declined, Education has been provided regarding the importance of this vaccine but patient still declined. Advised may receive this vaccine at local pharmacy or Health Dept. Aware to provide a copy of the vaccination record if obtained from local pharmacy or Health Dept. Verbalized acceptance and understanding.  Pneumococcal vaccine status: Up to date  Covid-19 vaccine status: Completed vaccines  Qualifies for Shingles Vaccine? Yes   Zostavax completed No   Shingrix Completed?: No.    Education has been provided regarding the importance of this vaccine. Patient has been advised to call insurance company to determine out of pocket expense if they have not yet received this vaccine. Advised may also receive vaccine at local pharmacy or Health Dept. Verbalized acceptance and understanding.  Screening Tests Health Maintenance  Topic Date Due   Zoster Vaccines- Shingrix (1 of 2) Never done   Fecal DNA (Cologuard)  Never done   COVID-19 Vaccine (3 - Moderna risk series) 05/24/2020   OPHTHALMOLOGY EXAM  10/25/2020   INFLUENZA VACCINE  05/20/2021   Pneumococcal Vaccine 29-29 Years old (3 - PPSV23 if  available, else PCV20) 07/15/2021   TETANUS/TDAP  01/22/2022 (Originally 10/20/2012)   FOOT EXAM  11/28/2021   HEMOGLOBIN A1C  01/01/2022   MAMMOGRAM  09/21/2023   Hepatitis C Screening  Completed   HIV Screening  Completed   HPV VACCINES  Aged Out   PAP SMEAR-Modifier  Discontinued   COLONOSCOPY (Pts 45-62yr Insurance coverage will need to be confirmed)  Discontinued    Health Maintenance  Health  Maintenance Due  Topic Date Due   Zoster Vaccines- Shingrix (1 of 2) Never done   Fecal DNA (Cologuard)  Never done   COVID-19 Vaccine (3 - Moderna risk series) 05/24/2020   OPHTHALMOLOGY EXAM  10/25/2020   INFLUENZA VACCINE  05/20/2021   Pneumococcal Vaccine 147610Years old (3 - PPSV23 if available, else PCV20) 07/15/2021    Colorectal cancer screening: Type of screening: Colonoscopy. Completed Attempted 04/17/2021. Repeat every Pt is following up with GI in 2 weeks. years  Mammogram status: Completed 09/20/2021. Repeat every year  Bone Density status: Ordered NOT DUE UNTIL AGE 61. Pt provided with contact info and advised to call to schedule appt.  Lung Cancer Screening: (Low Dose CT Chest recommended if Age 63-80years, 30 pack-year currently smoking OR have quit w/in 15years.) does not qualify.    Additional Screening:  Hepatitis C Screening: does qualify; Completed 05/23/2016  Vision Screening: Recommended annual ophthalmology exams for early detection of glaucoma and other disorders of the eye. Is the patient up to date with their annual eye exam?  Yes  Who is the provider or what is the name of the office in which the patient attends annual eye exams? [facility] If pt is not established with a provider, would they like to be referred to a provider to establish care? No .   Dental Screening: Recommended annual dental exams for proper oral hygiene  Community Resource Referral / Chronic Care Management: CRR required this visit?  No   CCM required this visit?  No      Plan:     I have personally reviewed and noted the following in the patients chart:   Medical and social history Use of alcohol, tobacco or illicit drugs  Current medications and supplements including opioid prescriptions. Patient is not currently taking opioid prescriptions. Functional ability and status Nutritional status Physical activity Advanced directives List of other  physicians Hospitalizations, surgeries, and ER visits in previous 12 months Vitals Screenings to include cognitive, depression, and falls Referrals and appointments  In addition, I have reviewed and discussed with patient certain preventive protocols, quality metrics, and best practice recommendations. A written personalized care plan for preventive services as well as general preventive health recommendations were provided to patient.     MChriss Driver LPN   18/12/1515  Nurse Notes: Pt currently having issues with esophagus and stomach and is under care of GI. Pt has follow up with the GI Doctor in 2 weeks to determine when to do colonoscopy. Colonoscopy was attempted on 04/17/2021 but was unable to complete due to pt was unable to complete prep. Discussed flu, shingles, and tdap vaccines and how to obtain.

## 2021-11-21 ENCOUNTER — Ambulatory Visit (INDEPENDENT_AMBULATORY_CARE_PROVIDER_SITE_OTHER): Payer: Medicare Other | Admitting: Family Medicine

## 2021-11-21 ENCOUNTER — Encounter: Payer: Self-pay | Admitting: Family Medicine

## 2021-11-21 VITALS — BP 139/75 | HR 69 | Temp 97.4°F | Ht 61.0 in | Wt 153.0 lb

## 2021-11-21 DIAGNOSIS — R300 Dysuria: Secondary | ICD-10-CM

## 2021-11-21 DIAGNOSIS — N3001 Acute cystitis with hematuria: Secondary | ICD-10-CM | POA: Diagnosis not present

## 2021-11-21 LAB — MICROSCOPIC EXAMINATION
Renal Epithel, UA: NONE SEEN /hpf
WBC, UA: >30 /hpf — AB (ref 0–5)

## 2021-11-21 LAB — URINALYSIS, COMPLETE
Bilirubin, UA: NEGATIVE
Glucose, UA: NEGATIVE
Ketones, UA: NEGATIVE
Nitrite, UA: NEGATIVE
Protein,UA: NEGATIVE
Specific Gravity, UA: 1.015 (ref 1.005–1.030)
Urobilinogen, Ur: 0.2 mg/dL (ref 0.2–1.0)
pH, UA: 7 (ref 5.0–7.5)

## 2021-11-21 MED ORDER — SULFAMETHOXAZOLE-TRIMETHOPRIM 800-160 MG PO TABS: 1.0000 | ORAL_TABLET | Freq: Two times a day (BID) | ORAL | 0 refills | Status: DC

## 2021-11-21 NOTE — Patient Instructions (Signed)
Urinary Tract Infection, Adult A urinary tract infection (UTI) is an infection of any part of the urinary tract. The urinary tract includes the kidneys, ureters, bladder, and urethra. These organs make, store, and get rid of urine in the body. An upper UTI affects the ureters and kidneys. A lower UTI affects the bladder and urethra. What are the causes? Most urinary tract infections are caused by bacteria in your genital area around your urethra, where urine leaves your body. These bacteria grow and cause inflammation of your urinary tract. What increases the risk? You are more likely to develop this condition if: You have a urinary catheter that stays in place. You are not able to control when you urinate or have a bowel movement (incontinence). You are female and you: Use a spermicide or diaphragm for birth control. Have low estrogen levels. Are pregnant. You have certain genes that increase your risk. You are sexually active. You take antibiotic medicines. You have a condition that causes your flow of urine to slow down, such as: An enlarged prostate, if you are female. Blockage in your urethra. A kidney stone. A nerve condition that affects your bladder control (neurogenic bladder). Not getting enough to drink, or not urinating often. You have certain medical conditions, such as: Diabetes. A weak disease-fighting system (immunesystem). Sickle cell disease. Gout. Spinal cord injury. What are the signs or symptoms? Symptoms of this condition include: Needing to urinate right away (urgency). Frequent urination. This may include small amounts of urine each time you urinate. Pain or burning with urination. Blood in the urine. Urine that smells bad or unusual. Trouble urinating. Cloudy urine. Vaginal discharge, if you are female. Pain in the abdomen or the lower back. You may also have: Vomiting or a decreased appetite. Confusion. Irritability or tiredness. A fever or  chills. Diarrhea. The first symptom in older adults may be confusion. In some cases, they may not have any symptoms until the infection has worsened. How is this diagnosed? This condition is diagnosed based on your medical history and a physical exam. You may also have other tests, including: Urine tests. Blood tests. Tests for STIs (sexually transmitted infections). If you have had more than one UTI, a cystoscopy or imaging studies may be done to determine the cause of the infections. How is this treated? Treatment for this condition includes: Antibiotic medicine. Over-the-counter medicines to treat discomfort. Drinking enough water to stay hydrated. If you have frequent infections or have other conditions such as a kidney stone, you may need to see a health care provider who specializes in the urinary tract (urologist). In rare cases, urinary tract infections can cause sepsis. Sepsis is a life-threatening condition that occurs when the body responds to an infection. Sepsis is treated in the hospital with IV antibiotics, fluids, and other medicines. Follow these instructions at home: Medicines Take over-the-counter and prescription medicines only as told by your health care provider. If you were prescribed an antibiotic medicine, take it as told by your health care provider. Do not stop using the antibiotic even if you start to feel better. General instructions Make sure you: Empty your bladder often and completely. Do not hold urine for long periods of time. Empty your bladder after sex. Wipe from front to back after urinating or having a bowel movement if you are female. Use each tissue only one time when you wipe. Drink enough fluid to keep your urine pale yellow. Keep all follow-up visits. This is important. Contact a health care provider   if: Your symptoms do not get better after 1-2 days. Your symptoms go away and then return. Get help right away if: You have severe pain in your  back or your lower abdomen. You have a fever or chills. You have nausea or vomiting. Summary A urinary tract infection (UTI) is an infection of any part of the urinary tract, which includes the kidneys, ureters, bladder, and urethra. Most urinary tract infections are caused by bacteria in your genital area. Treatment for this condition often includes antibiotic medicines. If you were prescribed an antibiotic medicine, take it as told by your health care provider. Do not stop using the antibiotic even if you start to feel better. Keep all follow-up visits. This is important. This information is not intended to replace advice given to you by your health care provider. Make sure you discuss any questions you have with your health care provider. Document Revised: 05/18/2020 Document Reviewed: 05/18/2020 Elsevier Patient Education  2022 [employer]  

## 2021-11-21 NOTE — Progress Notes (Addendum)
HLK:TGYBWLSLHT, Koleen Distance, DO Chief Complaint  Patient presents with   Dysuria   Urinary Frequency    Current Issues:  Presents with 3 days of dysuria Associated symptoms include:  dysuria, hematuria, lower abdominal pain, and urinary incontinence  There is a previous history of of similar symptoms. Sexually active:  No   No concern for STI.  Prior to Admission medications   Medication Sig Start Date End Date Taking? Authorizing Provider  Accu-Chek Softclix Lancets lancets Please use to test blood sugar 3 times daily as directed. DX: E11.9 07/18/20  Yes Gottschalk, Ashly M, DO  albuterol (VENTOLIN HFA) 108 (90 Base) MCG/ACT inhaler Inhale 1-2 puffs into the lungs every 6 (six) hours as needed for wheezing or shortness of breath. 10/15/20  Yes Ronnie Doss M, DO  aspirin 81 MG EC tablet Take 81 mg by mouth daily.   Yes [provider]  atorvastatin (LIPITOR) 80 MG tablet TAKE 1 TABLET BY MOUTH ONCE DAILY AT  6  PM 04/23/21  Yes Gottschalk, Ashly M, DO  Blood Glucose Monitoring Suppl (ACCU-CHEK GUIDE ME) w/Device KIT Please use to test blood sugar 3 times daily as directed. DX: E11.9 07/18/20  Yes Ronnie Doss M, DO  diclofenac sodium (VOLTAREN) 1 % GEL Apply 2 g topically 4 (four) times daily. 07/26/13  Yes Vernie Shanks, MD  fish oil-omega-3 fatty acids 1000 MG capsule Take 2 g by mouth daily.   Yes [provider]  glucose blood (ACCU-CHEK GUIDE) test strip Please use to test blood sugar 3 times daily as directed. DX: E11.9 07/18/20  Yes Gottschalk, Leatrice Jewels M, DO  levocetirizine (XYZAL) 5 MG tablet Take 1 tablet (5 mg total) by mouth every evening. 07/31/21  Yes Gwenlyn Perking, FNP  lisinopril (ZESTRIL) 10 MG tablet Take 1 tablet by mouth once daily 07/22/21  Yes Gottschalk, Ashly M, DO  sucralfate (CARAFATE) 1 g tablet Take 1 g by mouth 4 (four) times daily. 06/07/21  Yes [provider]  LANTUS SOLOSTAR 100 UNIT/ML Solostar Pen INJECT 48 UNITS  SUBCUTANEOUSLY ONCE DAILY AT 10 PM Patient not taking: Reported on 11/21/2021 07/10/20   [name], DO  TRULICITY 3 DS/2.8JG SOPN INJECT 1 DOSE SUBCUTANEOUSLY ONCE A WEEK Patient not taking: Reported on 11/21/2021 07/22/21   [name], DO    Review of Systems: Review of Systems  Constitutional:  Negative for chills and fever.  Genitourinary:  Positive for dysuria, frequency, hematuria and urgency. Negative for flank pain.  All other systems reviewed and are negative.   PE:  BP 139/75    Pulse 69    Temp (!) 97.4 F (36.3 C) (Temporal)    Ht _0  (1.549 m)    Wt 153 lb (69.4 kg)    SpO2 97%    BMI 28.91 kg/m  Physical Exam HENT:     Head: Normocephalic.  Cardiovascular:     Rate and Rhythm: Normal rate and regular rhythm.     Pulses: Normal pulses.     Heart sounds: Normal heart sounds.  Pulmonary:     Effort: Pulmonary effort is normal.     Breath sounds: Normal breath sounds.  Abdominal:     Tenderness: There is abdominal tenderness in the suprapubic area. There is no right CVA tenderness or left CVA tenderness.  Neurological:     Mental Status: She is alert.     Motor: Weakness present.  Psychiatric:        Mood and Affect:  Mood normal.     Results for orders placed or performed in visit on 08/21/21  PCV MYOCARDIAL PERFUSION WITH LEXISCAN  Result Value Ref Range   ST Depression (mm) 0 mm    Assessment and Plan:    Chanteria was seen today for dysuria and urinary frequency.  Diagnoses and all orders for this visit:  Dysuria Urinalysis in office consistent with cystitis. Results discussed with pt.  -     Urinalysis, Complete -     sulfamethoxazole-trimethoprim (BACTRIM DS) 800-160 MG tablet; Take 1 tablet by mouth 2 (two) times daily. Acute cystitis with hematuria -     sulfamethoxazole-trimethoprim (BACTRIM DS) 800-160 MG tablet; Take 1 tablet by mouth 2 (two) times daily. - encouraged increased water intake   The above assessment and management  plan was discussed with the patient. The patient verbalized understanding of and has agreed to the management plan. Patient is aware to call the clinic if they develop any new symptoms or if symptoms fail to improve or worsen. Patient is aware when to return to the clinic for a follow-up visit. Patient educated on when it is appropriate to go to the emergency department.   Return in about 2 weeks (around 12/05/2021), or if symptoms worsen or fail to improve.   Deidre Ala, NP-S  I personally was present during the history, physical exam, and medical decision-making activities of this visit and have verified that the services and findings are accurately documented in the nurse practitioner student's note.  Monia Pouch, FNP-C [facility] [name] [place], [place] [address] [phone]

## 2021-11-24 LAB — URINE CULTURE

## 2021-11-26 ENCOUNTER — Other Ambulatory Visit: Payer: Self-pay

## 2021-11-26 DIAGNOSIS — Z87440 Personal history of urinary (tract) infections: Secondary | ICD-10-CM

## 2021-12-04 ENCOUNTER — Ambulatory Visit (INDEPENDENT_AMBULATORY_CARE_PROVIDER_SITE_OTHER): Payer: Medicare Other

## 2021-12-04 DIAGNOSIS — F411 Generalized anxiety disorder: Secondary | ICD-10-CM

## 2021-12-04 DIAGNOSIS — E785 Hyperlipidemia, unspecified: Secondary | ICD-10-CM

## 2021-12-04 DIAGNOSIS — F32A Depression, unspecified: Secondary | ICD-10-CM

## 2021-12-04 DIAGNOSIS — E1169 Type 2 diabetes mellitus with other specified complication: Secondary | ICD-10-CM

## 2021-12-04 DIAGNOSIS — E1159 Type 2 diabetes mellitus with other circulatory complications: Secondary | ICD-10-CM

## 2021-12-04 DIAGNOSIS — E1122 Type 2 diabetes mellitus with diabetic chronic kidney disease: Secondary | ICD-10-CM

## 2021-12-04 DIAGNOSIS — N183 Chronic kidney disease, stage 3 unspecified: Secondary | ICD-10-CM

## 2021-12-04 DIAGNOSIS — C539 Malignant neoplasm of cervix uteri, unspecified: Secondary | ICD-10-CM

## 2021-12-04 NOTE — Chronic Care Management (AMB) (Signed)
Chronic Care Management    Clinical Social Work Note  12/04/2021 Name: Casey Sanders MRN: [id number] DOB: Feb 16, 1961  Casey Sanders is a 61 y.o. year old female who is a primary care patient of [name], DO. The CCM team was consulted to assist the patient with chronic disease management and/or care coordination needs related to: [employer] .   Engaged with patient by telephone for follow up visit in response to provider referral for social work chronic care management and care coordination services.   Consent to Services:  The patient was given information about Chronic Care Management services, agreed to services, and gave verbal consent prior to initiation of services.  Please see initial visit note for detailed documentation.   Patient agreed to services and consent obtained.   Assessment: Review of patient past medical history, allergies, medications, and health status, including review of relevant consultants reports was performed today as part of a comprehensive evaluation and provision of chronic care management and care coordination services.     SDOH (Social Determinants of Health) assessments and interventions performed:  SDOH Interventions    Flowsheet Row Most Recent Value  SDOH Interventions   Physical Activity Interventions Other (Comments)  [walking challenges]  Stress Interventions Provide Counseling  [client has stress related to managing medical condtion]  Depression Interventions/Treatment  Counseling        Advanced Directives Status: See Vyn[employer] application for related entries.  CCM Care Plan  Allergies  Allergen Reactions   Codeine Hives, Nausea And Vomiting and Other (See Comments)   Jardiance [Empagliflozin]     Caused blisters   Metformin And Related     nausea   Niaspan [Niacin Er] Other (See Comments)    Increase blood glucose    Outpatient Encounter Medications as of 12/04/2021  Medication Sig   Accu-Chek Softclix Lancets  lancets Please use to test blood sugar 3 times daily as directed. DX: E11.9   albuterol (VENTOLIN HFA) 108 (90 Base) MCG/ACT inhaler Inhale 1-2 puffs into the lungs every 6 (six) hours as needed for wheezing or shortness of breath.   aspirin 81 MG EC tablet Take 81 mg by mouth daily.   atorvastatin (LIPITOR) 80 MG tablet TAKE 1 TABLET BY MOUTH ONCE DAILY AT  6  PM   Blood Glucose Monitoring Suppl (ACCU-CHEK GUIDE ME) w/Device KIT Please use to test blood sugar 3 times daily as directed. DX: E11.9   diclofenac sodium (VOLTAREN) 1 % GEL Apply 2 g topically 4 (four) times daily.   fish oil-omega-3 fatty acids 1000 MG capsule Take 2 g by mouth daily.   glucose blood (ACCU-CHEK GUIDE) test strip Please use to test blood sugar 3 times daily as directed. DX: E11.9   LANTUS SOLOSTAR 100 UNIT/ML Solostar Pen INJECT 48 UNITS SUBCUTANEOUSLY ONCE DAILY AT 10 PM (Patient not taking: Reported on 11/21/2021)   levocetirizine (XYZAL) 5 MG tablet Take 1 tablet (5 mg total) by mouth every evening.   lisinopril (ZESTRIL) 10 MG tablet Take 1 tablet by mouth once daily   sucralfate (CARAFATE) 1 g tablet Take 1 g by mouth 4 (four) times daily.   sulfamethoxazole-trimethoprim (BACTRIM DS) 800-160 MG tablet Take 1 tablet by mouth 2 (two) times daily.   TRULICITY 3 SF/6.8LE SOPN INJECT 1 DOSE SUBCUTANEOUSLY ONCE A WEEK (Patient not taking: Reported on 11/21/2021)   No facility-administered encounter medications on file as of 12/04/2021.    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   Dysphagia 10/23/2021   Hematochezia 10/23/2021   Hypertensive disorder 12/14/2018   Asthma 03/09/2017   Vitamin D deficiency 03/09/2017   Generalized anxiety disorder 05/23/2016   Esophageal motility  disorder 05/23/2016   Depressive disorder 05/23/2016   Fibromyalgia 05/23/2016   Family history of cardiac disorder 05/23/2016   Diabetic neuropathy ([place]) 12/19/2013   Neuropathy due to type 2 diabetes mellitus ([place]) 03/17/2013   Degeneration of lumbar intervertebral disc 01/04/2013   Chronic pain syndrome 01/04/2013   Arthropathy of lumbar facet joint 01/04/2013   Syncope 02/12/2011   Diabetes mellitus ([place]) 01/04/2011   Chronic pain 01/04/2011   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 01/04/2011   Hyperlipidemia associated with type 2 diabetes mellitus ([place]) 01/04/2011   Restless legs 01/04/2011   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[place]) 01/04/2011   Palpitations 01/04/2011    Conditions to be addressed/monitored: monitor client management of anxiety issues and of depression issues  Care Plan : LCSW Care Plan  Updates made by Katha Cabal, LCSW since 12/04/2021 12:00 AM     Problem: Emotional Distress      Goal: Emotional Health Supported;Manage Anxiety issues; Manage Depression issues   Start Date: 12/04/2021  Expected End Date: 03/03/2022  This Visit's Progress: On track  Recent Progress: On track  Priority: Medium  Note:   Current Barriers:  Chronic Mental Health needs related to anxiety issues and depression issues Mobility challenges Suicidal Ideation/Homicidal Ideation: No Weight loss  Incontinency challenges  Clinical Social Work Goal(s):  patient will work with SW monthly by telephone or in person to reduce or manage symptoms related to anxiety issues and depression issues patient will work with SW monthly to address concerns related to mobility of client and related to client completion of ADLs Client will communicate as needed in next 30 days with RNCM to discuss nursing needs of client  Interventions: 1:1 collaboration with [name], DO regarding development and update of comprehensive plan of care as  evidenced by provider attestation and co-signature Discussed with client pain issues of client and sleeping challenges of client.  Reviewed with client the swallowing challenges of client Discussed mood status with client. She said her mood was stable. She did say she is hoping that she can get help in resolving current UTI she has.  Discussed with client her family support (grandson lives with her and is helpful to client; daughters are supportive and both daughters live nearby) Discussed vision of client. Client has new pair of glasses. Reviewed appetite of client. She eats small amounts of food. She said she eats about 4 small meals daily Client said she continues to have difficulty swallowing and has occasional stomach pain. Encouraged client to call RNCM as needed for nursing support Provided counseling support for client Reviewed transport needs of client. She said her grandson often transports her to and from her medical appointments  Patient Self Care Activities:  Self administers medications as prescribed  Patient Coping Strengths:  Family Friends  Patient Self Care Deficits:  Anxiety issues Depression issues Incontinency challenges  Patient Goals:  - spend time or talk with others at least 2 to 3 times per week - practice relaxation or meditation daily - keep a calendar with appointment dates  Follow Up Plan: LCSW to call client  on 01/30/22 at 11:00 AM to assess client needs      Norva Riffle.Arianis Bowditch MSW, [place] representative [facility] Care Management [phone]

## 2021-12-04 NOTE — Patient Instructions (Addendum)
Visit Information  Patient Goals: Protect My Health. Manage anxiety issues. Manage depression issues  Timeframe:  Short-Term Goal Priority:  Medium Progress: On Track Start Date:    12/04/21                          Expected End Date:  03/03/22                 Follow Up Date   01/30/22 at 11:00 AM  Protect My Health; Manage Anxiety issues; Manage Depression issues  Patient Self Care Activities:  Self administers medications as prescribed  Patient Coping Strengths:  Family Friends  Patient Self Care Deficits:  Anxiety issues Depression issues  Patient Goals:  - spend time or talk with others at least 2 to 3 times per week - practice relaxation or meditation daily - keep a calendar with appointment dates  Follow Up Plan: LCSW to call client on 01/30/22 at 11:00 AM to assess client needs   Norva Riffle.Lonnie Reth MSW, [place] representative [facility] Care Management [phone]

## 2021-12-05 ENCOUNTER — Other Ambulatory Visit: Payer: Medicare Other

## 2021-12-05 DIAGNOSIS — Z029 Encounter for administrative examinations, unspecified: Secondary | ICD-10-CM

## 2021-12-05 DIAGNOSIS — Z87440 Personal history of urinary (tract) infections: Secondary | ICD-10-CM | POA: Diagnosis not present

## 2021-12-05 DIAGNOSIS — N390 Urinary tract infection, site not specified: Secondary | ICD-10-CM | POA: Diagnosis not present

## 2021-12-05 LAB — URINALYSIS, COMPLETE
Bilirubin, UA: NEGATIVE
Glucose, UA: NEGATIVE
Ketones, UA: NEGATIVE
Leukocytes,UA: NEGATIVE
Nitrite, UA: NEGATIVE
Protein,UA: NEGATIVE
RBC, UA: NEGATIVE
Specific Gravity, UA: 1.01 (ref 1.005–1.030)
Urobilinogen, Ur: 0.2 mg/dL (ref 0.2–1.0)
pH, UA: 7.5 (ref 5.0–7.5)

## 2021-12-05 LAB — MICROSCOPIC EXAMINATION
Bacteria, UA: NONE SEEN
Epithelial Cells (non renal): NONE SEEN /hpf (ref 0–10)
RBC, Urine: NONE SEEN /hpf (ref 0–2)
Renal Epithel, UA: NONE SEEN /hpf
WBC, UA: NONE SEEN /hpf (ref 0–5)

## 2021-12-07 LAB — URINE CULTURE

## 2021-12-10 ENCOUNTER — Telehealth: Payer: Self-pay | Admitting: Family Medicine

## 2021-12-10 NOTE — Telephone Encounter (Signed)
Pt states she is aware of 2/16 urine results, she completed antibiotics. She is still complaining of wetting herself and wants to know what she needs to do

## 2021-12-10 NOTE — Telephone Encounter (Signed)
Urine shows no infection.  I am glad to refer to urology for bladder evaluation should she desire.  If she does want this referral, please let me know if she prefers [place] versus [place]

## 2021-12-10 NOTE — Telephone Encounter (Signed)
Patient is aware of urine results but would like to know what to do next. Please call back and advise.

## 2021-12-10 NOTE — Telephone Encounter (Signed)
Pt wants to wait and and see if the issue gets better

## 2022-01-17 ENCOUNTER — Other Ambulatory Visit: Payer: Self-pay | Admitting: Family Medicine

## 2022-01-17 DIAGNOSIS — E1169 Type 2 diabetes mellitus with other specified complication: Secondary | ICD-10-CM

## 2022-01-30 ENCOUNTER — Telehealth: Payer: Medicare Other

## 2022-02-11 ENCOUNTER — Other Ambulatory Visit: Payer: Self-pay

## 2022-02-11 DIAGNOSIS — E1169 Type 2 diabetes mellitus with other specified complication: Secondary | ICD-10-CM

## 2022-02-11 MED ORDER — ATORVASTATIN CALCIUM 80 MG PO TABS: 80.0000 mg | ORAL_TABLET | Freq: Every day | ORAL | 0 refills | Status: DC

## 2022-02-18 ENCOUNTER — Other Ambulatory Visit: Payer: Self-pay | Admitting: Gastroenterology

## 2022-02-18 DIAGNOSIS — R935 Abnormal findings on diagnostic imaging of other abdominal regions, including retroperitoneum: Secondary | ICD-10-CM

## 2022-02-18 DIAGNOSIS — K869 Disease of pancreas, unspecified: Secondary | ICD-10-CM

## 2022-02-18 DIAGNOSIS — R9389 Abnormal findings on diagnostic imaging of other specified body structures: Secondary | ICD-10-CM

## 2022-02-20 ENCOUNTER — Encounter: Payer: Self-pay | Admitting: Family

## 2022-02-20 ENCOUNTER — Ambulatory Visit (INDEPENDENT_AMBULATORY_CARE_PROVIDER_SITE_OTHER): Payer: Medicare Other | Admitting: Family

## 2022-02-20 VITALS — BP 155/89 | HR 76 | Temp 97.9°F | Resp 16 | Ht 61.0 in | Wt 157.0 lb

## 2022-02-20 DIAGNOSIS — R300 Dysuria: Secondary | ICD-10-CM | POA: Diagnosis not present

## 2022-02-20 DIAGNOSIS — N3000 Acute cystitis without hematuria: Secondary | ICD-10-CM | POA: Diagnosis not present

## 2022-02-20 DIAGNOSIS — R109 Unspecified abdominal pain: Secondary | ICD-10-CM

## 2022-02-20 DIAGNOSIS — R1011 Right upper quadrant pain: Secondary | ICD-10-CM | POA: Diagnosis not present

## 2022-02-20 LAB — MICROSCOPIC EXAMINATION
Bacteria, UA: NONE SEEN
Epithelial Cells (non renal): NONE SEEN /hpf (ref 0–10)
RBC, Urine: NONE SEEN /hpf (ref 0–2)
WBC, UA: NONE SEEN /hpf (ref 0–5)

## 2022-02-20 LAB — URINALYSIS, COMPLETE
Bilirubin, UA: NEGATIVE
Glucose, UA: NEGATIVE
Ketones, UA: NEGATIVE
Leukocytes,UA: NEGATIVE
Nitrite, UA: NEGATIVE
Protein,UA: NEGATIVE
RBC, UA: NEGATIVE
Specific Gravity, UA: 1.01 (ref 1.005–1.030)
Urobilinogen, Ur: 0.2 mg/dL (ref 0.2–1.0)
pH, UA: 7 (ref 5.0–7.5)

## 2022-02-20 LAB — URINE CULTURE: Organism ID, Bacteria: NO GROWTH

## 2022-02-20 MED ORDER — CEPHALEXIN 500 MG PO CAPS: 500.0000 mg | ORAL_CAPSULE | Freq: Two times a day (BID) | ORAL | 0 refills | Status: DC

## 2022-02-20 NOTE — Patient Instructions (Signed)
Urinary Tract Infection, Adult  A urinary tract infection (UTI) is an infection of any part of the urinary tract. The urinary tract includes the kidneys, ureters, bladder, and urethra. These organs make, store, and get rid of urine in the body. An upper UTI affects the ureters and kidneys. A lower UTI affects the bladder and urethra. What are the causes? Most urinary tract infections are caused by bacteria in your genital area around your urethra, where urine leaves your body. These bacteria grow and cause inflammation of your urinary tract. What increases the risk? You are more likely to develop this condition if: You have a urinary catheter that stays in place. You are not able to control when you urinate or have a bowel movement (incontinence). You are female and you: Use a spermicide or diaphragm for birth control. Have low estrogen levels. Are pregnant. You have certain genes that increase your risk. You are sexually active. You take antibiotic medicines. You have a condition that causes your flow of urine to slow down, such as: An enlarged prostate, if you are female. Blockage in your urethra. A kidney stone. A nerve condition that affects your bladder control (neurogenic bladder). Not getting enough to drink, or not urinating often. You have certain medical conditions, such as: Diabetes. A weak disease-fighting system (immunesystem). Sickle cell disease. Gout. Spinal cord injury. What are the signs or symptoms? Symptoms of this condition include: Needing to urinate right away (urgency). Frequent urination. This may include small amounts of urine each time you urinate. Pain or burning with urination. Blood in the urine. Urine that smells bad or unusual. Trouble urinating. Cloudy urine. Vaginal discharge, if you are female. Pain in the abdomen or the lower back. You may also have: Vomiting or a decreased appetite. Confusion. Irritability or tiredness. A fever or  chills. Diarrhea. The first symptom in older adults may be confusion. In some cases, they may not have any symptoms until the infection has worsened. How is this diagnosed? This condition is diagnosed based on your medical history and a physical exam. You may also have other tests, including: Urine tests. Blood tests. Tests for STIs (sexually transmitted infections). If you have had more than one UTI, a cystoscopy or imaging studies may be done to determine the cause of the infections. How is this treated? Treatment for this condition includes: Antibiotic medicine. Over-the-counter medicines to treat discomfort. Drinking enough water to stay hydrated. If you have frequent infections or have other conditions such as a kidney stone, you may need to see a health care provider who specializes in the urinary tract (urologist). In rare cases, urinary tract infections can cause sepsis. Sepsis is a life-threatening condition that occurs when the body responds to an infection. Sepsis is treated in the hospital with IV antibiotics, fluids, and other medicines. Follow these instructions at home:  Medicines Take over-the-counter and prescription medicines only as told by your health care provider. If you were prescribed an antibiotic medicine, take it as told by your health care provider. Do not stop using the antibiotic even if you start to feel better. General instructions Make sure you: Empty your bladder often and completely. Do not hold urine for long periods of time. Empty your bladder after sex. Wipe from front to back after urinating or having a bowel movement if you are female. Use each tissue only one time when you wipe. Drink enough fluid to keep your urine pale yellow. Keep all follow-up visits. This is important. Contact a health   care provider if: Your symptoms do not get better after 1-2 days. Your symptoms go away and then return. Get help right away if: You have severe pain in  your back or your lower abdomen. You have a fever or chills. You have nausea or vomiting. Summary A urinary tract infection (UTI) is an infection of any part of the urinary tract, which includes the kidneys, ureters, bladder, and urethra. Most urinary tract infections are caused by bacteria in your genital area. Treatment for this condition often includes antibiotic medicines. If you were prescribed an antibiotic medicine, take it as told by your health care provider. Do not stop using the antibiotic even if you start to feel better. Keep all follow-up visits. This is important. This information is not intended to replace advice given to you by your health care provider. Make sure you discuss any questions you have with your health care provider. Document Revised: 05/18/2020 Document Reviewed: 05/18/2020 Elsevier Patient Education  2023 Elsevier Inc.  

## 2022-02-20 NOTE — Progress Notes (Addendum)
? ?Subjective:  ? ? Patient ID: Casey Sanders, female    DOB: Feb 27, 1961, 61 y.o.   MRN: [id number] ? ?Chief Complaint  ?Patient presents with  ? Urinary Tract Infection  ?  Dysuria, Back pain, x 3-4 days   ? ? ?Dysuria  ?This is a new problem. The current episode started 1 to 4 weeks ago. The problem occurs intermittently. The problem has been gradually worsening. The quality of the pain is described as burning and aching. The pain is at a severity of 7/10. The pain is mild. There has been no fever. Associated symptoms include flank pain, frequency, hesitancy, nausea and urgency. Pertinent negatives include no hematuria or vomiting. She has tried increased fluids for the symptoms. The treatment provided mild relief.  ? ? ? ?Review of Systems  ?Gastrointestinal:  Positive for nausea. Negative for vomiting.  ?Genitourinary:  Positive for dysuria, flank pain, frequency, hesitancy and urgency. Negative for hematuria.  ?All other systems reviewed and are negative. ? ?   ?Objective:  ? Physical Exam ?Vitals reviewed.  ?Constitutional:   ?   General: She is not in acute distress. ?   Appearance: She is well-developed.  ?HENT:  ?   Head: Normocephalic and atraumatic.  ?Eyes:  ?   Pupils: Pupils are equal, round, and reactive to light.  ?Neck:  ?   Thyroid: No thyromegaly.  ?Cardiovascular:  ?   Rate and Rhythm: Normal rate and regular rhythm.  ?   Heart sounds: Normal heart sounds. No murmur heard. ?Pulmonary:  ?   Effort: Pulmonary effort is normal. No respiratory distress.  ?   Breath sounds: Normal breath sounds. No wheezing.  ?Abdominal:  ?   General: Bowel sounds are normal. There is no distension.  ?   Palpations: Abdomen is soft.  ?   Tenderness: There is abdominal tenderness (mild RLQ). There is right CVA tenderness (mild).  ?Musculoskeletal:     ?   General: No tenderness. Normal range of motion.  ?   Cervical back: Normal range of motion and neck supple.  ?Skin: ?   General: Skin is warm and dry.  ?Neurological:   ?   Mental Status: She is alert and oriented to person, place, and time.  ?   Cranial Nerves: No cranial nerve deficit.  ?   Deep Tendon Reflexes: Reflexes are normal and symmetric.  ?Psychiatric:     ?   Behavior: Behavior normal.     ?   Thought Content: Thought content normal.     ?   Judgment: Judgment normal.  ? ? ? ? ?BP (!) 155/89   Pulse 76   Temp 97.9 ?F (36.6 ?C)   Resp 16   Ht '5\' 1"'$  (1.549 m)   Wt 157 lb (71.2 kg)   SpO2 99%   BMI 29.66 kg/m?  ? ?   ?Assessment & Plan:  ?Briaunna Grindstaff comes in today with chief complaint of Urinary Tract Infection (Dysuria, Back pain, x 3-4 days ) ? ? ?Diagnosis and orders addressed: ? ?1. Dysuria ?- Urinalysis, Complete ? ?2. Acute cystitis without hematuria ?Force fluids ?AZO over the counter X2 days ?RTO if symptoms worsen or do not improve  ?Culture pending ?Urine negative today, but given symptoms will go ahead and treat. If symptoms continue and urine culture is negative will need further testing.  ?- cephALEXin (KEFLEX) 500 MG capsule; Take 1 capsule (500 mg total) by mouth 2 (two) times daily.  Dispense: 14 capsule; Refill: 0 ? ? ?  3. Right upper quadrant abdominal pain ?- CT Abdomen Pelvis W Contrast; Future ? ?4. Flank pain ? ?- CT Abdomen Pelvis W Contrast; Future ? ?Pt calls with continued pain. Urine and culture negative. Will order CT. PT can not go today and can not get ride until Thursday. Urgent CT placed. Go to ED if symptoms worsen.  ? ? ?Evelina Dun, FNP ? ? ? ?

## 2022-02-25 ENCOUNTER — Ambulatory Visit (INDEPENDENT_AMBULATORY_CARE_PROVIDER_SITE_OTHER): Payer: Medicare Other | Admitting: Licensed Clinical Social Worker

## 2022-02-25 DIAGNOSIS — E785 Hyperlipidemia, unspecified: Secondary | ICD-10-CM

## 2022-02-25 DIAGNOSIS — N183 Chronic kidney disease, stage 3 unspecified: Secondary | ICD-10-CM

## 2022-02-25 DIAGNOSIS — I152 Hypertension secondary to endocrine disorders: Secondary | ICD-10-CM

## 2022-02-25 DIAGNOSIS — F32A Depression, unspecified: Secondary | ICD-10-CM

## 2022-02-25 DIAGNOSIS — C539 Malignant neoplasm of cervix uteri, unspecified: Secondary | ICD-10-CM

## 2022-02-25 DIAGNOSIS — E1169 Type 2 diabetes mellitus with other specified complication: Secondary | ICD-10-CM

## 2022-02-25 DIAGNOSIS — F411 Generalized anxiety disorder: Secondary | ICD-10-CM

## 2022-02-25 DIAGNOSIS — E1122 Type 2 diabetes mellitus with diabetic chronic kidney disease: Secondary | ICD-10-CM

## 2022-02-25 NOTE — Patient Instructions (Addendum)
Visit Information ? ?Patient Goals:  Protect My Health. Manage Anxiety issues. Manage Depression issues ? ?Timeframe:  Short-Term Goal ?Priority:  Medium ?Progress: On Track ?Start Date:    12/04/21                          ?Expected End Date:  05/15/22                  ? ?Follow Up Date   04/08/22 at 2:00 PM  ? ?Protect My Health; Manage Anxiety issues; Manage Depression issues ? ?Patient Self Care Activities:  ?Self administers medications as prescribed ? ?Patient Coping Strengths:  ?Family ?Friends ? ?Patient Self Care Deficits:  ?Anxiety issues ?Depression issues ? ?Patient Goals:  ?- spend time or talk with others at least 2 to 3 times per week ?- practice relaxation or meditation daily ?- keep a calendar with appointment dates ? ?Follow Up Plan: LCSW to call client on 04/08/22 at 2:00 PM to assess client needs  ? ?Norva Riffle.Tylesha Gibeault MSW, LCSW ?Licensed Clinical Social Worker ?[facility] ?[phone] ?

## 2022-02-25 NOTE — Chronic Care Management (AMB) (Signed)
?Chronic Care Management  ? ? Clinical Social Work Note ? ?02/25/2022 ?Name: Casey Sanders MRN: [id number] DOB: 04-05-1961 ? ?Casey Sanders is a 61 y.o. year old female who is a primary care patient of [name], DO. The CCM team was consulted to assist the patient with chronic disease management and/or care coordination needs related to: [employer] .  ? ?Engaged with patient by telephone for follow up visit in response to provider referral for social work chronic care management and care coordination services.  ? ?Consent to Services:  ?The patient was given information about Chronic Care Management services, agreed to services, and gave verbal consent prior to initiation of services.  Please see initial visit note for detailed documentation.  ? ?Patient agreed to services and consent obtained.  ? ?Assessment: Review of patient past medical history, allergies, medications, and health status, including review of relevant consultants reports was performed today as part of a comprehensive evaluation and provision of chronic care management and care coordination services.    ? ?SDOH (Social Determinants of Health) assessments and interventions performed:  ?SDOH Interventions   ? ?Flowsheet Row Most Recent Value  ?SDOH Interventions   ?Physical Activity Interventions Other (Comments)  [walking challenges. she uses a walker to help her walk]  ?Stress Interventions Provide Counseling  [client has stress related to managing medical needs]  ?Depression Interventions/Treatment  Counseling  ? ?  ?  ? ?Advanced Directives Status: See Vynca application for related entries. ? ?CCM Care Plan ? ?Allergies  ?Allergen Reactions  ? Codeine Hives, Nausea And Vomiting and Other (See Comments)  ? Jardiance [Empagliflozin]   ?  Caused blisters  ? Metformin And Related   ?  nausea  ? Niaspan [Niacin Er] Other (See Comments)  ?  Increase blood glucose  ? ? ?Outpatient Encounter Medications as of 02/25/2022  ?Medication Sig  ?  Accu-Chek Softclix Lancets lancets Please use to test blood sugar 3 times daily as directed. DX: E11.9  ? aspirin 81 MG EC tablet Take 81 mg by mouth daily.  ? atorvastatin (LIPITOR) 80 MG tablet Take 1 tablet (80 mg total) by mouth daily at 6 PM. (NEEDS TO BE SEEN BEFORE NEXT REFILL)  ? Blood Glucose Monitoring Suppl (ACCU-CHEK GUIDE ME) w/Device KIT Please use to test blood sugar 3 times daily as directed. DX: E11.9  ? cephALEXin (KEFLEX) 500 MG capsule Take 1 capsule (500 mg total) by mouth 2 (two) times daily.  ? diclofenac sodium (VOLTAREN) 1 % GEL Apply 2 g topically 4 (four) times daily.  ? fish oil-omega-3 fatty acids 1000 MG capsule Take 2 g by mouth daily.  ? glucose blood (ACCU-CHEK GUIDE) test strip Please use to test blood sugar 3 times daily as directed. DX: E11.9  ? LANTUS SOLOSTAR 100 UNIT/ML Solostar Pen INJECT 48 UNITS SUBCUTANEOUSLY ONCE DAILY AT 10 PM (Patient not taking: Reported on 02/20/2022)  ? lisinopril (ZESTRIL) 10 MG tablet Take 1 tablet by mouth once daily  ? TRULICITY 3 HA/1.9FX SOPN INJECT 1 DOSE SUBCUTANEOUSLY ONCE A WEEK (Patient not taking: Reported on 11/21/2021)  ? ?No facility-administered encounter medications on file as of 02/25/2022.  ? ? ?Patient Active Problem List  ? Diagnosis Date Noted  ? Abnormal findings on diagnostic imaging of other specified body structures 10/23/2021  ? Chronic superficial gastritis without bleeding 10/23/2021  ? Slow transit constipation 10/23/2021  ? Diverticular disease of colon 10/23/2021  ? Dysphagia 10/23/2021  ? Hematochezia 10/23/2021  ? Hypertensive disorder 12/14/2018  ?  Asthma 03/09/2017  ? Vitamin D deficiency 03/09/2017  ? Generalized anxiety disorder 05/23/2016  ? Esophageal motility disorder 05/23/2016  ? Depressive disorder 05/23/2016  ? Fibromyalgia 05/23/2016  ? Family history of cardiac disorder 05/23/2016  ? Diabetic neuropathy (HCC) 12/19/2013  ? Neuropathy due to type 2 diabetes mellitus (HCC) 03/17/2013  ? Degeneration of lumbar  intervertebral disc 01/04/2013  ? Chronic pain syndrome 01/04/2013  ? Arthropathy of lumbar facet joint 01/04/2013  ? Syncope 02/12/2011  ? Diabetes mellitus (HCC) 01/04/2011  ? Chronic pain 01/04/2011  ? 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 Hyperlipidemia associated with type 2 diabetes mellitus (HCC) 01/04/2011  ? Restless legs 01/04/2011  ? 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HCC) 01/04/2011  ? Palpitations 01/04/2011  ? ? ?Conditions to be addressed/monitored: monitor client management of anxiety and depression ? ?Care Plan : LCSW Care Plan  ?Updates made by Forrest, Michael S, LCSW since 02/25/2022 12:00 AM  ?  ? ?Problem: Emotional Distress   ?  ? ?Goal: Emotional Health Supported;Manage Anxiety issues; Manage Depression issues   ?Start Date: 12/04/2021  ?Expected End Date: 05/15/2022  ?This Visit's Progress: On track  ?Recent Progress: On track  ?Priority: Medium  ?Note:   ?Current Barriers:  ?Chronic Mental Health needs related to anxiety issues and depression issues ?Mobility challenges ?Suicidal Ideation/Homicidal Ideation: No ?Weight loss  ?Incontinency challenges ? ?Clinical Social Work Goal(s):  ?patient will work with SW monthly by telephone or in person to reduce or manage symptoms related to anxiety issues and depression issues ?patient will work with SW monthly to address concerns related to mobility of client and related to client completion of ADLs ?Client will communicate as needed in next 30 days with RNCM to discuss nursing needs of client ? ?Interventions: ?1:1 collaboration with Gottschalk, Ashly M, DO regarding development and update of comprehensive plan of care as evidenced by provider attestation and co-signature ?Discussed with client pain issues of client and sleeping challenges of client. She said she has occasional stomach pain issues ?Reviewed with client the swallowing challenges of client. She said she cuts her food into small bites  and eats 3-4 small meals daily. She spoke of difficulty in swallowing ?Discussed mood status with client. She feels that her mood is basically stable. However , she is concerned over managing medical needs. She said she is scheduled for MRI on Mar 13, 2022.  She spoke of past history of cancer and is hoping to get good results with MRI. ?Discussed relaxation techniques. She enjoys making cards to give to others.She said that making cards to send others has been very relaxing to her and she is spending more time making these cards recently ?Discussed with client her family support (grandson lives with her and is helpful to client; daughters are supportive and both daughters live nearby) ?Reviewed appetite of client. She eats small amounts of food. She said she eats about 4 small meals daily ?Encouraged client to call RNCM as needed for nursing support. LCSW spoke with client about Triage Nurse role at [facility] and possible help with Triage Nurse , as needed ?Provided counseling support for client ?Reviewed transport needs of client. She said her grandson often transports her to and from her medical appointments ?Reviewed ambulation needs. She said she uses a walker to help her walk ? ?Patient Self Care Activities:  ?Self administers medications as prescribed ? ?Patient Coping Strengths:  ?Family ?Friends ? ?Patient Self Care Deficits:  ?Anxiety issues ?  Depression issues ?Incontinency challenges ? ?Patient Goals:  ?- spend time or talk with others at least 2 to 3 times per week ?- practice relaxation or meditation daily ?- keep a calendar with appointment dates ? ?Follow Up Plan: LCSW to call client on 04/08/22 at 2:00 PM to assess client needs   ?  ?Michael S.Forrest MSW, LCSW ?Licensed Clinical Social Worker ?[facility] ?[phone] ?

## 2022-02-25 NOTE — Addendum Note (Signed)
Addended by: Evelina Dun A on: 02/25/2022 03:58 PM ? ? Modules accepted: Orders, Level of Service ? ?

## 2022-02-27 ENCOUNTER — Ambulatory Visit (HOSPITAL_COMMUNITY)
Admission: RE | Admit: 2022-02-27 | Discharge: 2022-02-27 | Disposition: A | Discharge status: Home / Self Care | Payer: Medicare Other | Source: Ambulatory Visit | Attending: Family | Admitting: Family

## 2022-02-27 DIAGNOSIS — R109 Unspecified abdominal pain: Secondary | ICD-10-CM | POA: Diagnosis not present

## 2022-02-27 DIAGNOSIS — I700 Atherosclerosis of aorta: Secondary | ICD-10-CM | POA: Diagnosis not present

## 2022-02-27 DIAGNOSIS — R1011 Right upper quadrant pain: Secondary | ICD-10-CM | POA: Insufficient documentation

## 2022-02-27 LAB — POCT I-STAT CREATININE: Creatinine, Ser: 0.7 mg/dL (ref 0.44–1.00)

## 2022-02-27 IMAGING — CT CT ABD-PELV W/ CM
2 of 5 series · 16 of 46 positions shown, 18 images · IV contrast (Omnipaque or Isovue)
Comparison: [DATE]

CLINICAL DATA: Right upper quadrant pain, right flank pain

EXAM:
CT ABDOMEN AND PELVIS WITH CONTRAST
TECHNIQUE: Multidetector CT imaging of the abdomen and pelvis was performed
using the standard protocol following bolus administration of
intravenous contrast.

[Series 2: axial st · axial · 0.85mm/px · z∈[+1016,+1456]mm · 13 of 100 slices shown, 15 images]
[im 6/100  soft-tissue]
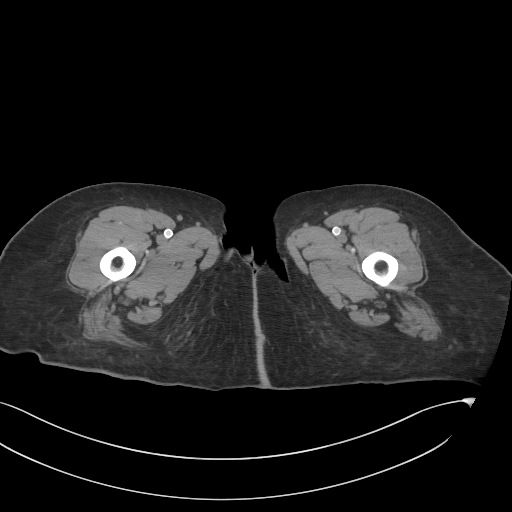
[im 6/100  bone]
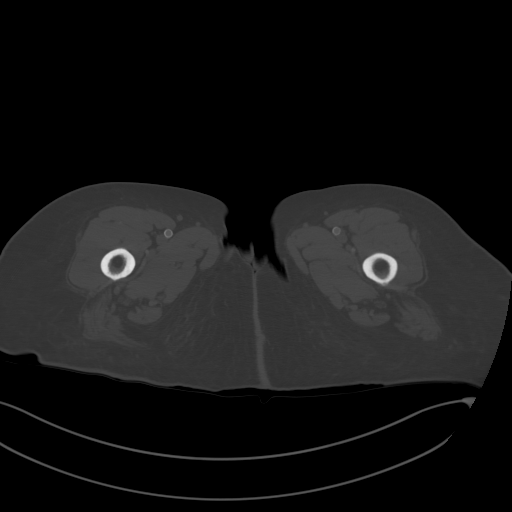
[im 12/100  soft-tissue]
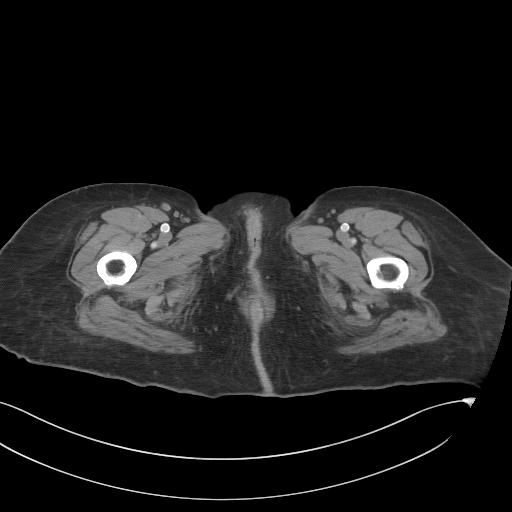
[im 24/100  soft-tissue]
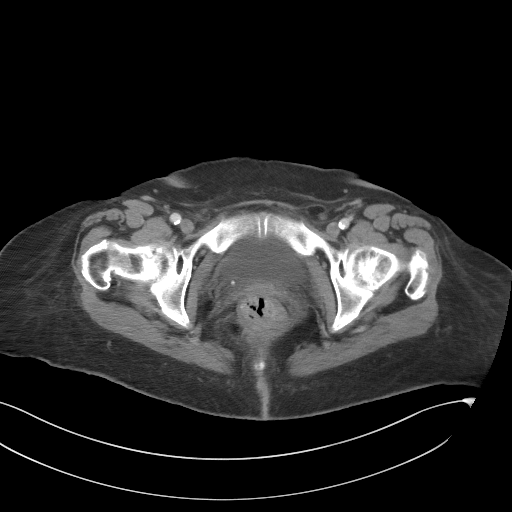
[im 30/100  soft-tissue]
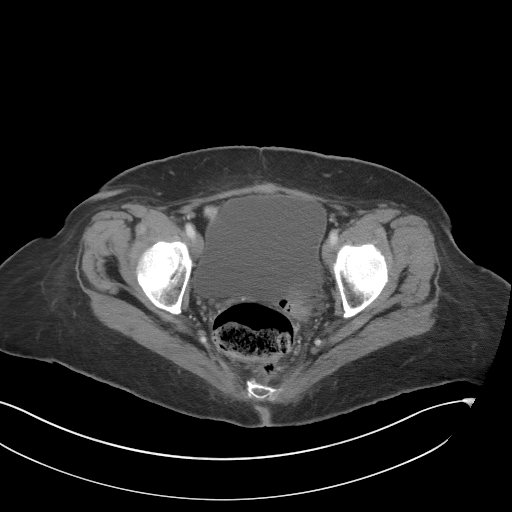
[im 35/100  soft-tissue]
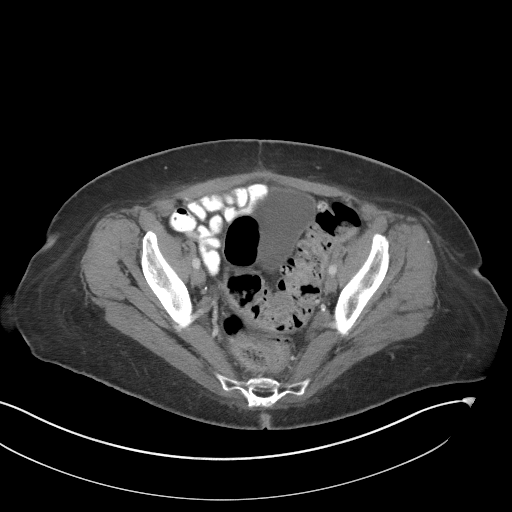
[im 41/100  soft-tissue]
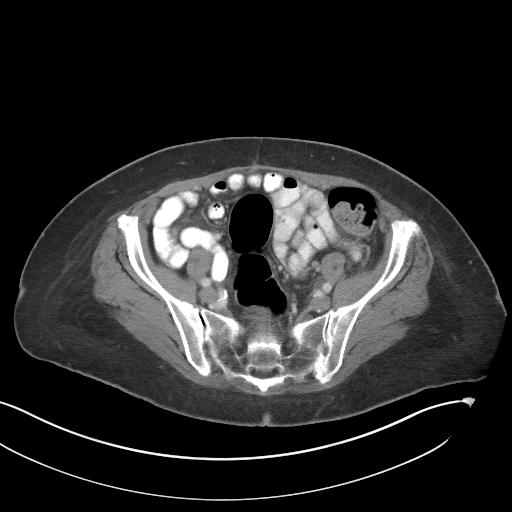
[im 53/100  soft-tissue]
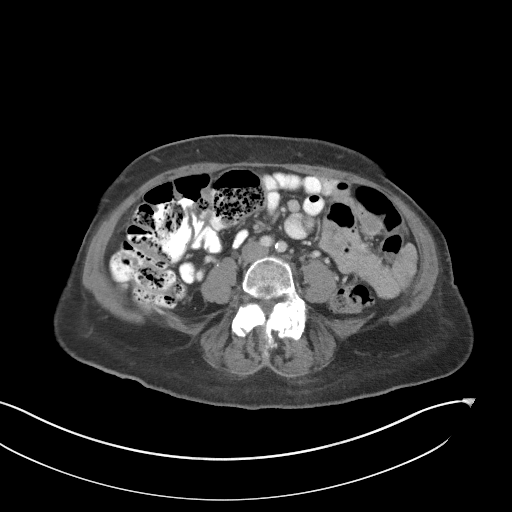
[im 59/100  soft-tissue]
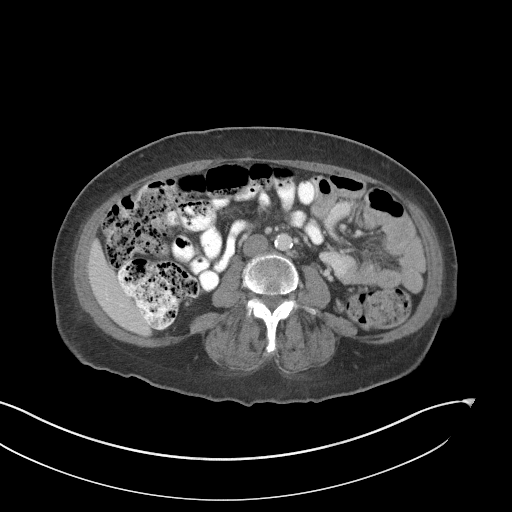
[im 65/100  soft-tissue]
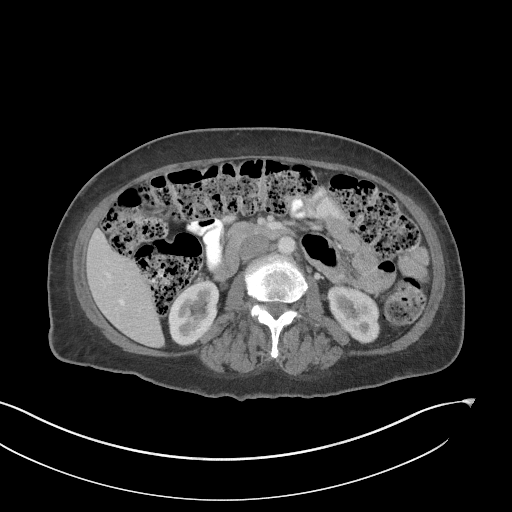
[im 65/100  bone]
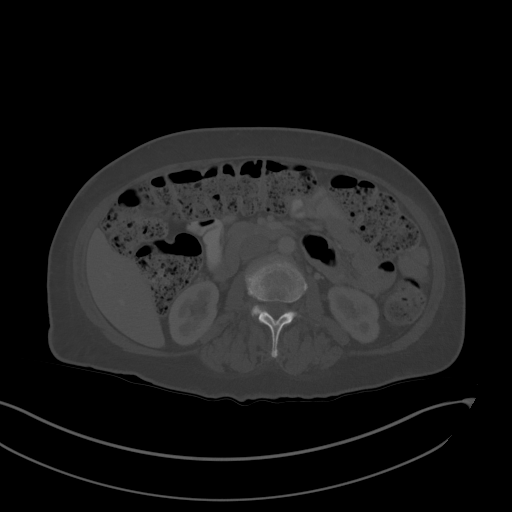
[im 70/100  soft-tissue]
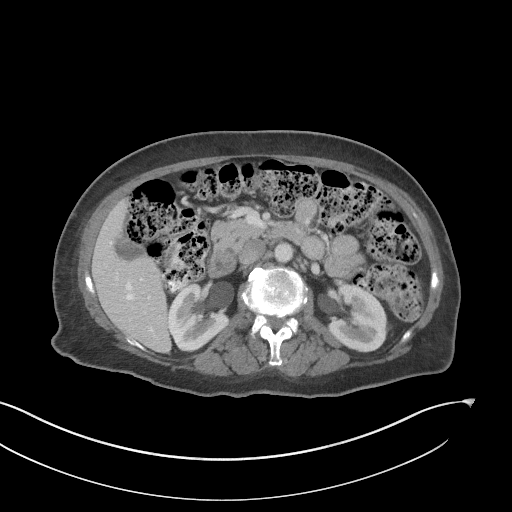
[im 76/100  soft-tissue]
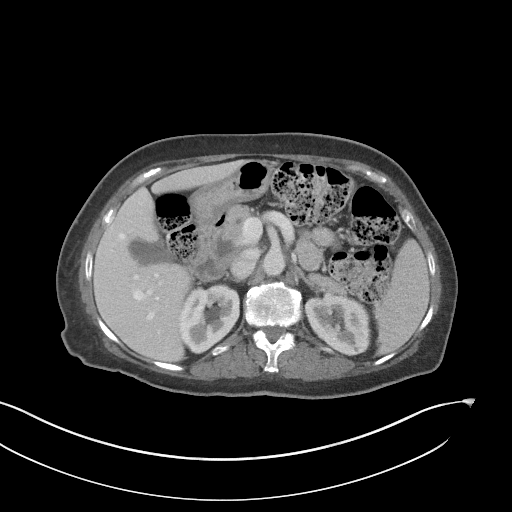
[im 88/100  soft-tissue]
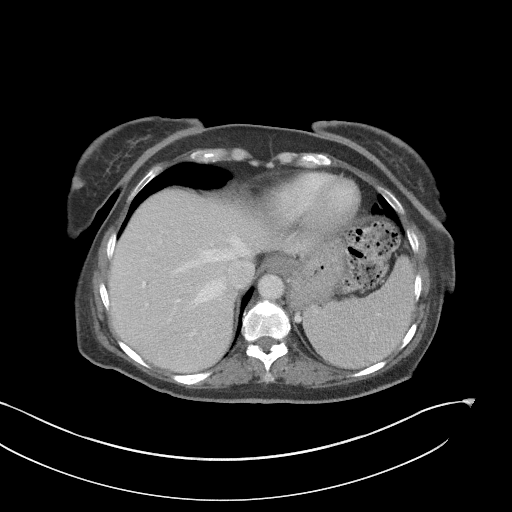
[im 94/100  soft-tissue]
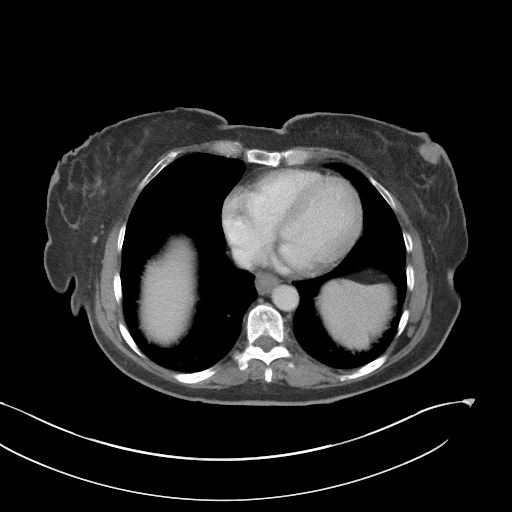

[Series 5: coronal st · coronal · 0.87mm/px · 3 of 92 slices shown]
[im 31/92  soft-tissue]
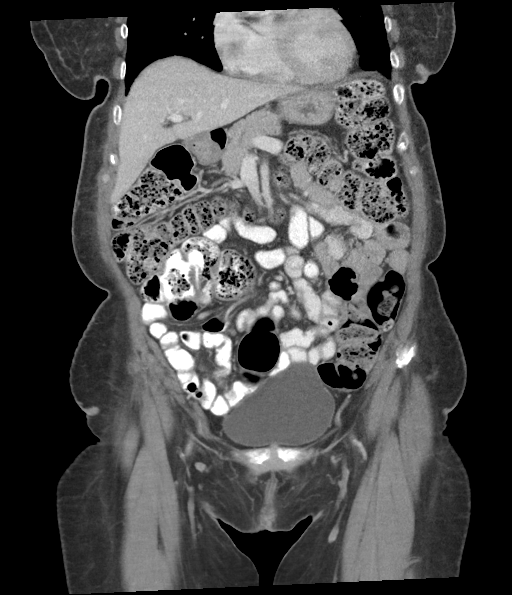
[im 41/92  soft-tissue]
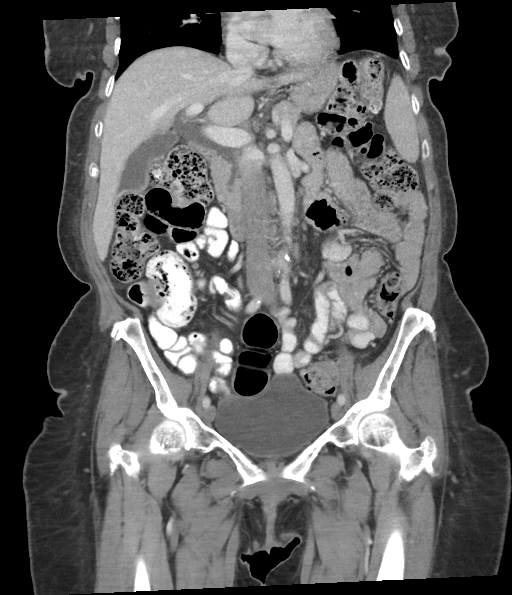
[im 51/92  soft-tissue]
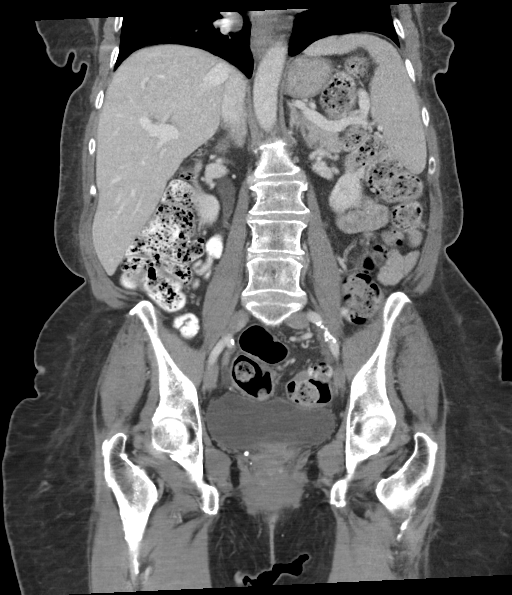

[16 of 46 positions shown; findings below may reference images not displayed]

RADIATION DOSE REDUCTION: This exam was performed according to the
departmental dose-optimization program which includes automated
exposure control, adjustment of the mA and/or kV according to
patient size and/or use of iterative reconstruction technique.

CONTRAST:  100mL OMNIPAQUE IOHEXOL 300 MG/ML  SOLN
FINDINGS: Lower chest: No acute findings

Hepatobiliary: No focal hepatic abnormality. Gallbladder
unremarkable.

Pancreas: No focal abnormality or ductal dilatation.

Spleen: No focal abnormality.  Normal size.

Adrenals/Urinary Tract: No adrenal abnormality. No focal renal
abnormality. No stones or hydronephrosis. Urinary bladder is
unremarkable.

Stomach/Bowel: Large stool burden in the colon. Scattered colonic
diverticulosis. No active diverticulitis. Stomach and small bowel
decompressed, unremarkable.

Vascular/Lymphatic: Aortic atherosclerosis. No evidence of aneurysm
or adenopathy.

Reproductive: Prior hysterectomy.  No adnexal masses.

Other: No free fluid or free air.

Musculoskeletal: No acute bony abnormality.
IMPRESSION: Large stool burden throughout the colon.

Scattered colonic diverticulosis.

Aortic atherosclerosis.

No acute findings.

## 2022-02-27 MED ORDER — IOHEXOL 300 MG/ML  SOLN
100.0000 mL | Freq: Once | INTRAMUSCULAR | Status: AC | PRN
  Administered 2022-02-27: 100 mL via INTRAVENOUS

## 2022-03-07 ENCOUNTER — Other Ambulatory Visit: Payer: Medicare Other

## 2022-03-13 ENCOUNTER — Encounter: Payer: Self-pay | Admitting: Family Medicine

## 2022-03-13 ENCOUNTER — Ambulatory Visit
Admission: RE | Admit: 2022-03-13 | Discharge: 2022-03-13 | Disposition: A | Discharge status: Home / Self Care | Payer: Medicare Other | Source: Ambulatory Visit | Attending: Gastroenterology | Admitting: Gastroenterology

## 2022-03-13 ENCOUNTER — Other Ambulatory Visit: Payer: Medicare Other

## 2022-03-13 ENCOUNTER — Ambulatory Visit (INDEPENDENT_AMBULATORY_CARE_PROVIDER_SITE_OTHER): Payer: Medicare Other | Admitting: Family Medicine

## 2022-03-13 VITALS — BP 157/85 | HR 67 | Temp 97.7°F | Ht 61.0 in | Wt 158.0 lb

## 2022-03-13 DIAGNOSIS — E1169 Type 2 diabetes mellitus with other specified complication: Secondary | ICD-10-CM | POA: Diagnosis not present

## 2022-03-13 DIAGNOSIS — E1159 Type 2 diabetes mellitus with other circulatory complications: Secondary | ICD-10-CM | POA: Diagnosis not present

## 2022-03-13 DIAGNOSIS — G479 Sleep disorder, unspecified: Secondary | ICD-10-CM | POA: Diagnosis not present

## 2022-03-13 DIAGNOSIS — I152 Hypertension secondary to endocrine disorders: Secondary | ICD-10-CM

## 2022-03-13 DIAGNOSIS — E785 Hyperlipidemia, unspecified: Secondary | ICD-10-CM | POA: Diagnosis not present

## 2022-03-13 DIAGNOSIS — R935 Abnormal findings on diagnostic imaging of other abdominal regions, including retroperitoneum: Secondary | ICD-10-CM

## 2022-03-13 DIAGNOSIS — Z794 Long term (current) use of insulin: Secondary | ICD-10-CM | POA: Diagnosis not present

## 2022-03-13 DIAGNOSIS — F419 Anxiety disorder, unspecified: Secondary | ICD-10-CM

## 2022-03-13 DIAGNOSIS — K5790 Diverticulosis of intestine, part unspecified, without perforation or abscess without bleeding: Secondary | ICD-10-CM | POA: Diagnosis not present

## 2022-03-13 DIAGNOSIS — K862 Cyst of pancreas: Secondary | ICD-10-CM | POA: Diagnosis not present

## 2022-03-13 DIAGNOSIS — K869 Disease of pancreas, unspecified: Secondary | ICD-10-CM

## 2022-03-13 IMAGING — MR MR ABDOMEN WO/W CM MRCP
13 of 21 series · 26 of 48 positions shown · IV contrast (multihance)
Comparison: CT abdomen/pelvis [DATE].  MRI [DATE].

CLINICAL DATA: Pancreatic lesion.

EXAM:
MRI ABDOMEN WITHOUT AND WITH CONTRAST (INCLUDING MRCP)
TECHNIQUE: Multiplanar multisequence MR imaging of the abdomen was performed
both before and after the administration of intravenous contrast.
Heavily T2-weighted images of the biliary and pancreatic ducts were
obtained, and three-dimensional MRCP images were rendered by post
processing.
CONTRAST:  14mL MULTIHANCE GADOBENATE DIMEGLUMINE 529 MG/ML IV SOLN

[Series 3: T2 · axial · 6.0mm · 0.78mm/px · z∈[-177,+79]mm · 2 of 38 slices shown (1 of 4)]
[im 1/38]
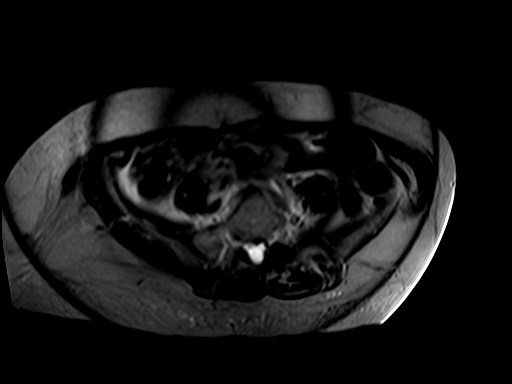
[im 38/38]
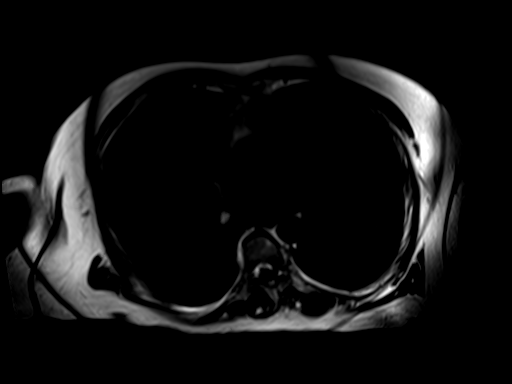

[Series 4: T2 · coronal · 5.0mm · 1.56mm/px · 1 of 35 slices shown (2 of 4)]
[im 1/35]
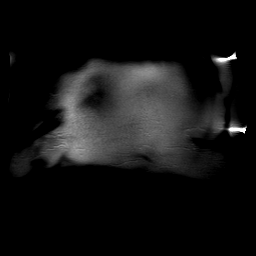

[Series 5: T2 · axial · 6.0mm · 1.56mm/px · 1 of 36 slices shown (3 of 4)]
[im 1/36]
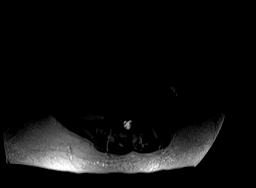

[Series 9: ep2d_diff_b50_500_800_p2 · axial · 6.0mm · 2.08mm/px · z∈[-184,+86]mm · 3 of 114 slices shown]
[im 1/114]
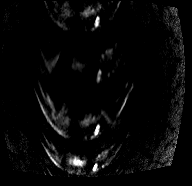
[im 57/114]
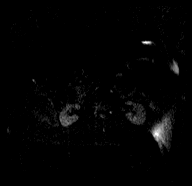
[im 114/114]
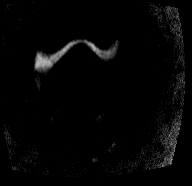

[Series 10: ep2d_diff_b50_500_800_p2_adc · axial · 6.0mm · 2.08mm/px · 1 of 40 slices shown]
[im 1/40]
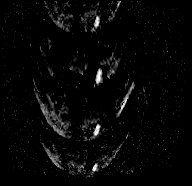

[Series 11: axial tru fisp · axial · 5.0mm · 1.56mm/px · 1 of 42 slices shown]
[im 1/42]
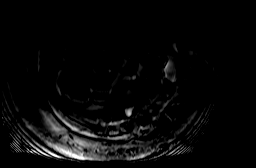

[Series 12: T2 · coronal · 3.0mm · 1.48mm/px · 1 of 50 slices shown (4 of 4)]
[im 1/50]
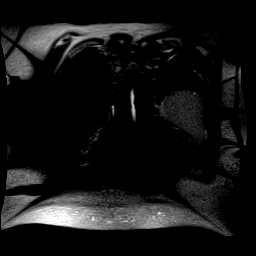

[Series 15: axial in out · axial · 6.0mm · 0.78mm/px · z∈[-164,+85]mm · 2 of 74 slices shown]
[im 1/74]
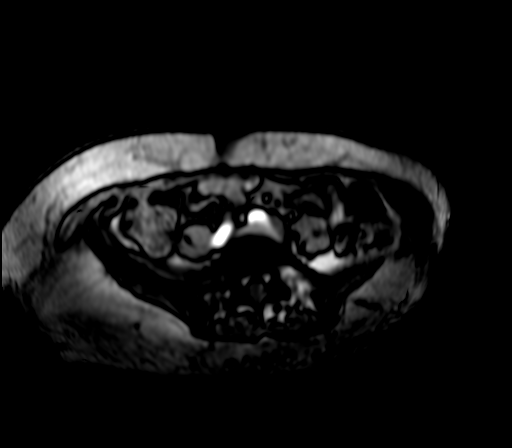
[im 74/74]
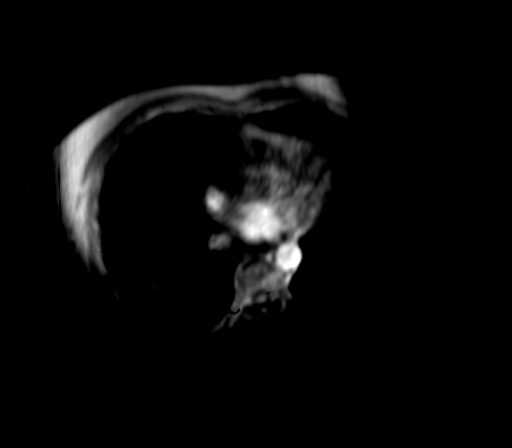

[Series 16: T1 dynamic · axial · non-contrast · 2.0mm · 0.78mm/px · z∈[-155,+99]mm · 3 of 128 slices shown]
[im 1/128]
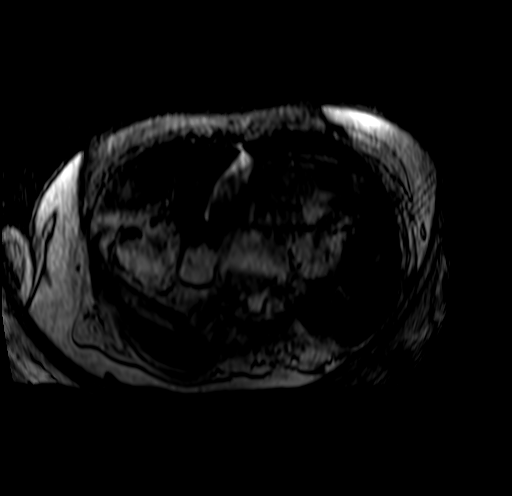
[im 64/128]
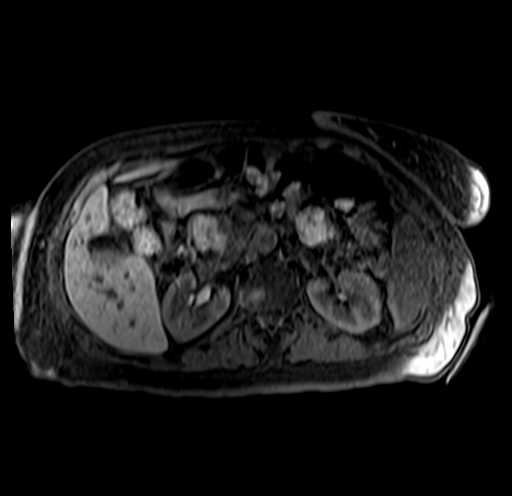
[im 128/128]
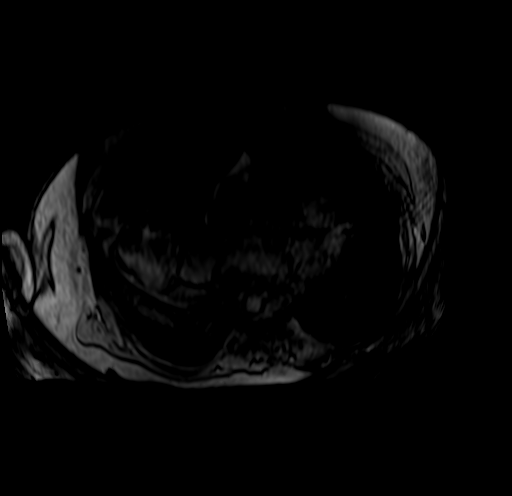

[Series 17: post 30 sec · axial · 2.0mm · 0.78mm/px · z∈[-155,+99]mm · 3 of 128 slices shown]
[im 1/128]
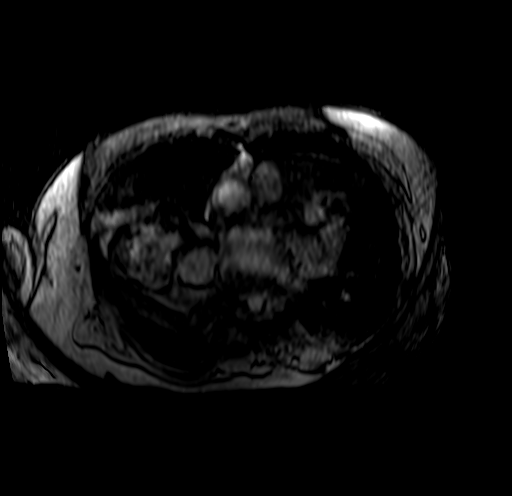
[im 64/128]
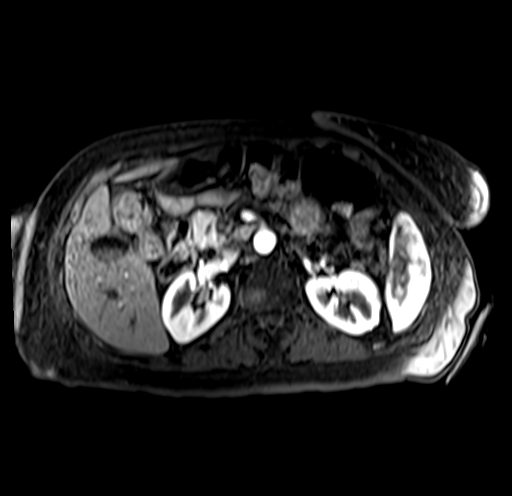
[im 128/128]
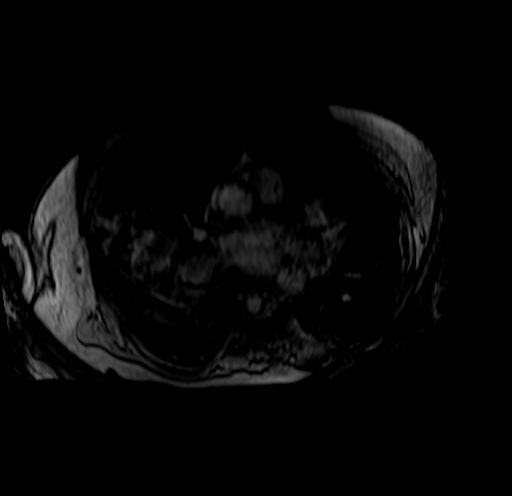

[Series 18: post 30 sec_sub · axial · 2.0mm · 0.78mm/px · z∈[-155,+99]mm · 3 of 128 slices shown]
[im 1/128]
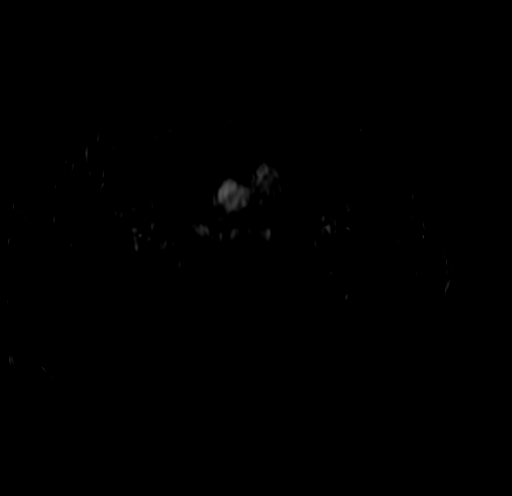
[im 64/128]
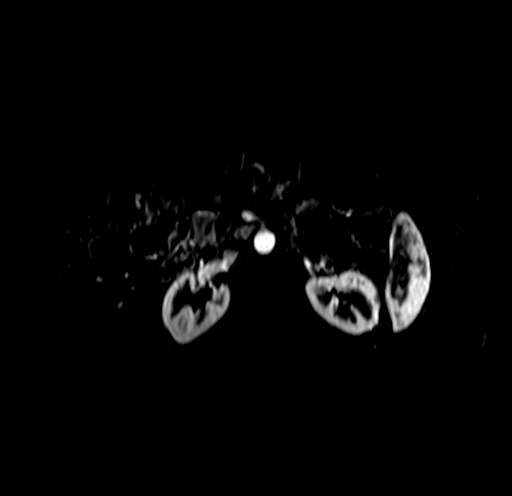
[im 128/128]
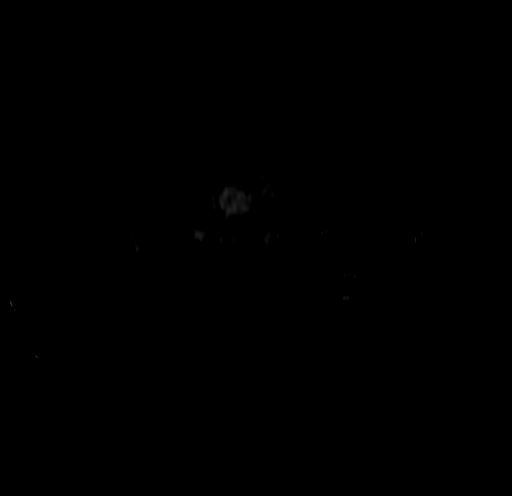

[Series 19: post 60 sec · axial · 2.0mm · 0.78mm/px · z∈[-155,+99]mm · 3 of 128 slices shown]
[im 1/128]
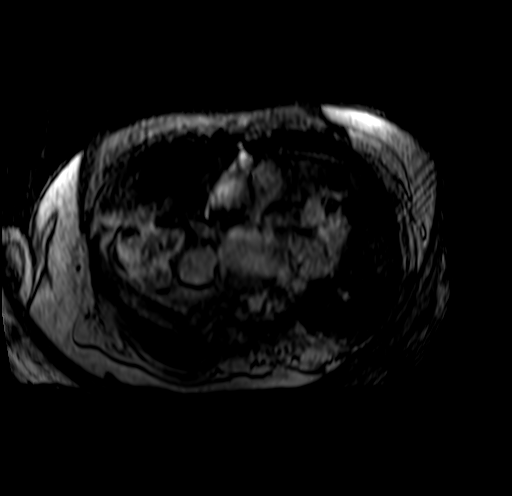
[im 64/128]
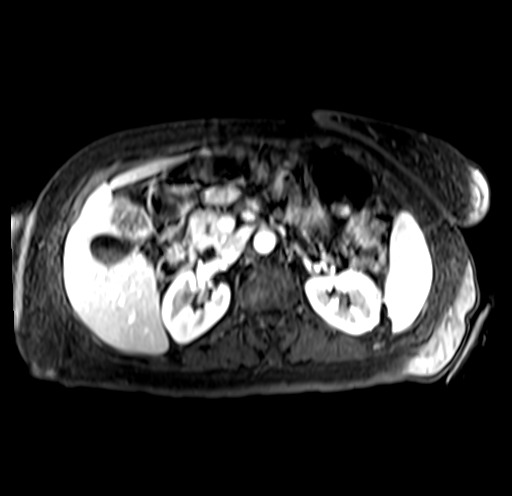
[im 128/128]
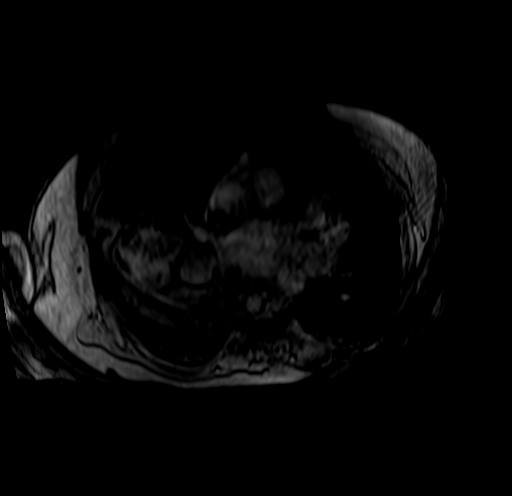

[Series 20: post 60 sec_sub · axial · 2.0mm · 0.78mm/px · z∈[-155,-29]mm · 2 of 128 slices shown]
[im 1/128]
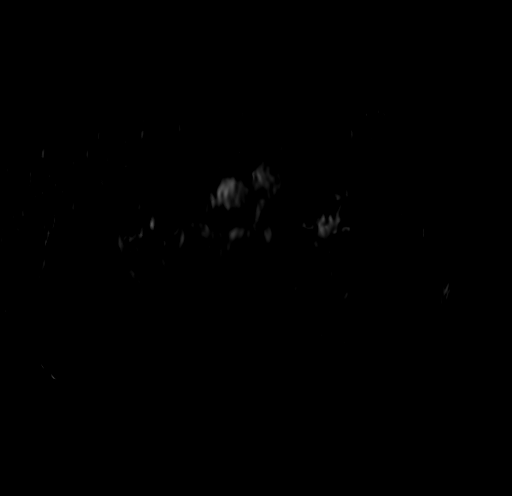
[im 64/128]
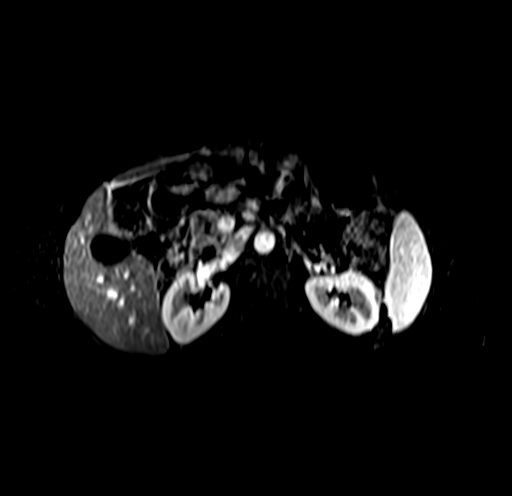

[26 of 48 positions shown; findings below may reference images not displayed]

FINDINGS: Lower chest: Unremarkable.

Hepatobiliary: No suspicious focal abnormality within the liver
parenchyma. There is no evidence for gallstones, gallbladder wall
thickening, or pericholecystic fluid. No intrahepatic or
extrahepatic biliary dilation.

Pancreas: The tiny T2 hyperintensity seen along the pancreatic body
on previous MR is not clearly evident on T2 imaging today.
Postcontrast T1 imaging shows a 5 mm focus of non enhancement in
this region which could be compatible with a tiny cystic lesion. No
main duct dilatation. No suspicious enhancing mass lesion within the
pancreas.

Spleen:  No splenomegaly. No focal mass lesion.

Adrenals/Urinary Tract: No adrenal nodule or mass. Kidneys
unremarkable.

Stomach/Bowel: Stomach is unremarkable. No gastric wall thickening.
No evidence of outlet obstruction. Duodenum is normally positioned
as is the ligament of Treitz. No small bowel or colonic dilatation
within the visualized abdomen. Diverticular changes noted left colon

Vascular/Lymphatic: No abdominal aortic aneurysm. No abdominal
lymphadenopathy

Other:  No intraperitoneal free fluid.

Musculoskeletal: No focal suspicious marrow enhancement within the
visualized bony anatomy.
IMPRESSION: 1. Tiny T2 hyperintensity seen in the pancreatic body seen on
previous MR is not clearly evident on T2 imaging today. There is a
tiny focus of non enhancement in the pancreas in the same region
today which could be a tiny cystic lesion, likely benign. Follow-up
MRI in 12 months recommended. This recommendation follows ACR
consensus guidelines: Management of Incidental Pancreatic Cysts: A
White Paper of the ACR Incidental Findings Committee. [HOSPITAL] [20];[DATE].
2. Otherwise unremarkable MRI of the abdomen.

## 2022-03-13 MED ORDER — ATORVASTATIN CALCIUM 80 MG PO TABS: 80.0000 mg | ORAL_TABLET | Freq: Every day | ORAL | 3 refills | Status: DC

## 2022-03-13 MED ORDER — MIRTAZAPINE 7.5 MG PO TABS: 7.5000 mg | ORAL_TABLET | Freq: Every day | ORAL | 3 refills | Status: DC

## 2022-03-13 MED ORDER — LISINOPRIL 10 MG PO TABS: 10.0000 mg | ORAL_TABLET | Freq: Every day | ORAL | 3 refills | Status: DC

## 2022-03-13 MED ORDER — GADOBENATE DIMEGLUMINE 529 MG/ML IV SOLN
14.0000 mL | Freq: Once | INTRAVENOUS | Status: AC | PRN
  Administered 2022-03-13: 14 mL via INTRAVENOUS

## 2022-03-13 NOTE — Progress Notes (Signed)
Subjective: CC:DM. PCP: [name], DO WOE:[id number] Casey Sanders is a 61 y.o. female presenting to clinic today for:  1. Type 2 Diabetes with hypertension, hyperlipidemia:  Off Trulicity since 2500.  She is really not utilizing any medications right now for diabetes control because she has not had any elevations in blood sugar.  She unfortunately continues to suffer with decreased p.o. intake in the setting of chronic nausea and vomiting.  She eats very tiny meals because otherwise she ends up regurgitating whenever she eats.  She is still under work-up with gastroenterology and there was plan for colonoscopy but she has not been able to tolerate the volume that is needed to clean out her bowels and has subsequently not been able to proceed with colonoscopy x4 attempts.  Has not had any discussion about maybe being hospitalized for colonoscopy but this would interest her if she can get it completed.  She reports oral irritation secondary to almost daily vomiting  Last eye exam: Needs Last foot exam: Needs Last A1c:  Lab Results  Component Value Date   HGBA1C 6.1 (H) 07/04/2021   Nephropathy screen indicated?:  On ACE inhibitor Last flu, zoster and/or pneumovax:  Immunization History  Administered Date(s) Administered   Influenza Whole 10/20/2006   Influenza,inj,Quad PF,6+ Mos 08/22/2013, 09/07/2014, 07/15/2016, 08/20/2017, 07/05/2019   Moderna Sars-Covid-2 Vaccination 03/28/2020, 04/26/2020   Pneumococcal Conjugate-13 09/07/2014   Pneumococcal Polysaccharide-23 07/15/2016   Td 10/20/2002    ROS: No reports of chest pain or shortness of breath.  2.  Sleep difficulty Patient reports that she is been having quite a bit of difficulty with sleep both falling asleep and staying asleep.  She reports feeling anxious and her mind races at nighttime.  This often results in her feeling exhausted during the day and she feels like she cannot engage with her family because she is too tired to go  to family events etc.  ROS: Per HPI  Allergies  Allergen Reactions   Codeine Hives, Nausea And Vomiting and Other (See Comments)   Jardiance [Empagliflozin]     Caused blisters   Metformin And Related     nausea   Niaspan [Niacin Er] Other (See Comments)    Increase blood glucose   Past Medical History:  Diagnosis Date   Allergic rhinitis    Anxiety and depression    Asthma    Benign essential HTN 01/04/2011   Chest pain    a. Myoview 9/05: no ischemia, no scar, EF 70%   Chest pain, unspecified 02/12/2011   Cystitis 02/08/2015   Diverticulosis    DM2 (diabetes mellitus, type 2) ([place])    Extremity pain 01/04/2013   Fibromyalgia    Frequent UTI    GERD (gastroesophageal reflux disease)    Hiatal Hernia   HLD (hyperlipidemia)    HTN (hypertension)    Left rotator cuff tear    Low back pain 01/04/2013   Lumbar disc disease    Nephrolithiasis    Obesity    Palpitations    monitor 9/05: NSR, sinus tachy, PVCs   Pre-operative cardiovascular examination 02/12/2011   Rectal bleeding 08/22/2013   Sepsis secondary to UTI ([place]) 08/09/2017   uterine ca dx'd 1988   per pt    Current Outpatient Medications:    Accu-Chek Softclix Lancets lancets, Please use to test blood sugar 3 times daily as directed. DX: E11.9, Disp: 100 each, Rfl: 12   aspirin 81 MG EC tablet, Take 81 mg by mouth daily., Disp: ,  Rfl:    atorvastatin (LIPITOR) 80 MG tablet, Take 1 tablet (80 mg total) by mouth daily at 6 PM. (NEEDS TO BE SEEN BEFORE NEXT REFILL), Disp: 30 tablet, Rfl: 0   Blood Glucose Monitoring Suppl (ACCU-CHEK GUIDE ME) w/Device KIT, Please use to test blood sugar 3 times daily as directed. DX: E11.9, Disp: 1 kit, Rfl: 0   cephALEXin (KEFLEX) 500 MG capsule, Take 1 capsule (500 mg total) by mouth 2 (two) times daily., Disp: 14 capsule, Rfl: 0   diclofenac sodium (VOLTAREN) 1 % GEL, Apply 2 g topically 4 (four) times daily., Disp: 100 g, Rfl: 1   fish oil-omega-3 fatty acids 1000 MG capsule, Take  2 g by mouth daily., Disp: , Rfl:    glucose blood (ACCU-CHEK GUIDE) test strip, Please use to test blood sugar 3 times daily as directed. DX: E11.9, Disp: 100 each, Rfl: 12   LANTUS SOLOSTAR 100 UNIT/ML Solostar Pen, INJECT 48 UNITS SUBCUTANEOUSLY ONCE DAILY AT 10 PM (Patient not taking: Reported on 02/20/2022), Disp: 15 mL, Rfl: 0   lisinopril (ZESTRIL) 10 MG tablet, Take 1 tablet by mouth once daily, Disp: 90 tablet, Rfl: 1   TRULICITY 3 ZO/1.0RU SOPN, INJECT 1 DOSE SUBCUTANEOUSLY ONCE A WEEK (Patient not taking: Reported on 11/21/2021), Disp: 12 mL, Rfl: 0 Social History   Socioeconomic History   Marital status: Widowed    Spouse name: Not on file   Number of children: 2   Years of education: Not on file   Highest education level: Not on file  Occupational History   Occupation: disabled  Tobacco Use   Smoking status: Never   Smokeless tobacco: Never  Vaping Use   Vaping Use: Never used  Substance and Sexual Activity   Alcohol use: No   Drug use: No   Sexual activity: Not on file  Other Topics Concern   Not on file  Social History Narrative   2 daughters and 4 grandchildren.   Husband died in Dec 17, 2017.   Social Determinants of Health   Financial Resource Strain: Low Risk    Difficulty of Paying Living Expenses: Not hard at all  Food Insecurity: No Food Insecurity   Worried About Charity fundraiser in the Last Year: Never true   [place] in the Last Year: Never true  Transportation Needs: No Transportation Needs   Lack of Transportation (Medical): No   Lack of Transportation (Non-Medical): No  Physical Activity: Inactive   Days of Exercise per Week: 0 days   Minutes of Exercise per Session: 0 min  Stress: Stress Concern Present   Feeling of Stress : To some extent  Social Connections: Moderately Integrated   Frequency of Communication with Friends and Family: More than three times a week   Frequency of Social Gatherings with Friends and Family: More than three times a  week   Attends Religious Services: More than 4 times per year   Active Member of [employer] or Organizations: Yes   Attends Archivist Meetings: More than 4 times per year   Marital Status: Widowed  Human resources officer Violence: Not At Risk   Fear of Current or Ex-Partner: No   Emotionally Abused: No   Physically Abused: No   Sexually Abused: No   Family History  Problem Relation Age of Onset   Coronary artery disease Mother    Coronary artery disease Father    Heart disease Brother    Heart disease Brother    Lung cancer  Brother 70   Heart attack Brother 71       had a few heart attacks before   Heart disease Brother     Objective: Office vital signs reviewed. BP (!) 157/85   Pulse 67   Temp 97.7 F (36.5 C)   Ht _0  (1.549 m)   Wt 158 lb (71.7 kg)   SpO2 99%   BMI 29.85 kg/m   Physical Examination:  General: Awake, alert, nontoxic female, No acute distress HEENT: Oropharynx without significant erythema or ulcerations Cardio: regular rate and rhythm, S1S2 heard, no murmurs appreciated Pulm: clear to auscultation bilaterally, no wheezes, rhonchi or rales; normal work of breathing on room air Psych: Anxious    03/13/2022   10:43 AM 02/25/2022   10:22 AM 02/20/2022   11:11 AM  Depression screen PHQ 2/9  Decreased Interest 1 1 0  Down, Depressed, Hopeless 0 1 0  PHQ - 2 Score 1 2 0  Altered sleeping 3 1 0  Tired, decreased energy _1 Change in appetite 0 0 0  Feeling bad or failure about yourself  1 1 0  Trouble concentrating 1 1 0  Moving slowly or fidgety/restless _2 Suicidal thoughts 0 0 0  PHQ-9 Score _3 Difficult doing work/chores Somewhat difficult Somewhat difficult Somewhat difficult      03/13/2022   10:43 AM 02/20/2022   11:12 AM 02/19/2021   10:21 AM 11/28/2020    2:20 PM  GAD 7 : Generalized Anxiety Score  Nervous, Anxious, on Edge 1 0 2 2  Control/stop worrying 0 0 2 3  Worry too much - different things 0 0 2 3  Trouble relaxing _4 Restless 0 1 2 0  Easily annoyed or irritable 0 0 3 2  Afraid - awful might happen 0 0 2 1  Total GAD 7 Score _5 Anxiety Difficulty Somewhat difficult  Somewhat difficult Somewhat difficult    Assessment/ Plan: 61 y.o. female   Type 2 diabetes mellitus with other specified complication, with long-term current use of insulin (HCC) - Plan: Bayer DCA Hb A1c Waived  Hyperlipidemia associated with type 2 diabetes mellitus ([place]) - Plan: atorvastatin (LIPITOR) 80 MG tablet  Hypertension associated with diabetes ([place]) - Plan: lisinopril (ZESTRIL) 10 MG tablet  Sleep difficulties - Plan: mirtazapine (REMERON) 7.5 MG tablet  Anxiety - Plan: mirtazapine (REMERON) 7.5 MG tablet  Check A1c.  Continue statin  Blood pressure not at goal but may be secondary to anxiety.  Would like her to monitor blood pressures and if persistently above 150/90 we will advance her lisinopril  Trial of mirtazapine for both anxiety and sleep difficulty.  We will plan to reassess her again in 6 weeks, sooner if concerns arise   No orders of the defined types were placed in this encounter.  No orders of the defined types were placed in this encounter.    [name], DO [place] [phone]

## 2022-03-19 DIAGNOSIS — E1122 Type 2 diabetes mellitus with diabetic chronic kidney disease: Secondary | ICD-10-CM

## 2022-03-19 DIAGNOSIS — E785 Hyperlipidemia, unspecified: Secondary | ICD-10-CM

## 2022-03-19 DIAGNOSIS — F32A Depression, unspecified: Secondary | ICD-10-CM

## 2022-03-19 DIAGNOSIS — I152 Hypertension secondary to endocrine disorders: Secondary | ICD-10-CM

## 2022-03-19 DIAGNOSIS — E1159 Type 2 diabetes mellitus with other circulatory complications: Secondary | ICD-10-CM

## 2022-03-19 DIAGNOSIS — E1169 Type 2 diabetes mellitus with other specified complication: Secondary | ICD-10-CM

## 2022-03-19 DIAGNOSIS — N183 Chronic kidney disease, stage 3 unspecified: Secondary | ICD-10-CM

## 2022-04-08 ENCOUNTER — Ambulatory Visit (INDEPENDENT_AMBULATORY_CARE_PROVIDER_SITE_OTHER): Payer: Medicare Other | Admitting: Licensed Clinical Social Worker

## 2022-04-08 ENCOUNTER — Telehealth: Payer: Self-pay | Admitting: Family Medicine

## 2022-04-08 DIAGNOSIS — E1159 Type 2 diabetes mellitus with other circulatory complications: Secondary | ICD-10-CM

## 2022-04-08 DIAGNOSIS — C539 Malignant neoplasm of cervix uteri, unspecified: Secondary | ICD-10-CM

## 2022-04-08 DIAGNOSIS — Z794 Long term (current) use of insulin: Secondary | ICD-10-CM

## 2022-04-08 DIAGNOSIS — E1169 Type 2 diabetes mellitus with other specified complication: Secondary | ICD-10-CM

## 2022-04-08 DIAGNOSIS — F32A Depression, unspecified: Secondary | ICD-10-CM

## 2022-04-08 DIAGNOSIS — F419 Anxiety disorder, unspecified: Secondary | ICD-10-CM

## 2022-04-08 NOTE — Patient Instructions (Addendum)
Visit Information  Patient Goals:  Protect My Health. Manage Anxiety issues. Manage Depression issues  Timeframe:  Short-Term Goal Priority:  Medium Progress: On Track Start Date:    12/04/21                          Expected End Date:  07/10/22                    Follow Up Date   05/19/22 at 1:00 PM    Protect My Health; Manage Anxiety issues; Manage Depression issues  Patient Self Care Activities:  Self administers medications as prescribed  Patient Coping Strengths:  Family Friends  Patient Self Care Deficits:  Anxiety issues Depression issues  Patient Goals:  - spend time or talk with others at least 2 to 3 times per week - practice relaxation or meditation daily - keep a calendar with appointment dates  Follow Up Plan: LCSW to call client on 05/19/22 at 1:00 PM   Norva Riffle.Auryn Paige MSW, [place] representative [facility] Care Management [phone]

## 2022-04-08 NOTE — Telephone Encounter (Signed)
I recommend she be seen if she cannot sense the need to defecate.  If she is not even feeling the need to poop but is soiling herself she should get checked out.  I recommend she call her GI doctor to check in on the colonoscopy.

## 2022-04-08 NOTE — Telephone Encounter (Signed)
Patient informed of recommendations. Advised to call GI doctor

## 2022-04-08 NOTE — Chronic Care Management (AMB) (Signed)
Chronic Care Management    Clinical Social Work Note  04/08/2022 Name: Casey Sanders MRN: [id number] DOB: [birth year]  Casey Sanders is a 61 y.o. year old female who is a primary care patient of [name], DO. The CCM team was consulted to assist the patient with chronic disease management and/or care coordination needs related to: [employer] .   Engaged with patient by telephone for follow up visit in response to provider referral for social work chronic care management and care coordination services.   Consent to Services:  The patient was given information about Chronic Care Management services, agreed to services, and gave verbal consent prior to initiation of services.  Please see initial visit note for detailed documentation.   Patient agreed to services and consent obtained.   Assessment: Review of patient past medical history, allergies, medications, and health status, including review of relevant consultants reports was performed today as part of a comprehensive evaluation and provision of chronic care management and care coordination services.     SDOH (Social Determinants of Health) assessments and interventions performed:  SDOH Interventions    Flowsheet Row Most Recent Value  SDOH Interventions   Physical Activity Interventions Other (Comments)  [walking challenges.uses a walker to help her walk]  Stress Interventions Provide Counseling  [client has stress related to managing her medical needs]  Depression Interventions/Treatment  Counseling        Advanced Directives Status: See [employer] application for related entries.  CCM Care Plan  Allergies  Allergen Reactions   Codeine Hives, Nausea And Vomiting and Other (See Comments)   Jardiance [Empagliflozin]     Caused blisters   Metformin And Related     nausea   Niaspan [Niacin Er] Other (See Comments)    Increase blood glucose    Outpatient Encounter Medications as of 04/08/2022  Medication Sig    Accu-Chek Softclix Lancets lancets Please use to test blood sugar 3 times daily as directed. DX: E11.9   aspirin 81 MG EC tablet Take 81 mg by mouth daily.   atorvastatin (LIPITOR) 80 MG tablet Take 1 tablet (80 mg total) by mouth daily at 6 PM.   Blood Glucose Monitoring Suppl (ACCU-CHEK GUIDE ME) w/Device KIT Please use to test blood sugar 3 times daily as directed. DX: E11.9   diclofenac sodium (VOLTAREN) 1 % GEL Apply 2 g topically 4 (four) times daily.   fish oil-omega-3 fatty acids 1000 MG capsule Take 2 g by mouth daily.   glucose blood (ACCU-CHEK GUIDE) test strip Please use to test blood sugar 3 times daily as directed. DX: E11.9   lisinopril (ZESTRIL) 10 MG tablet Take 1 tablet (10 mg total) by mouth daily.   mirtazapine (REMERON) 7.5 MG tablet Take 1 tablet (7.5 mg total) by mouth at bedtime.   No facility-administered encounter medications on file as of 04/08/2022.    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   Dysphagia 10/23/2021   Hematochezia 10/23/2021   Hypertensive disorder 12/14/2018   Asthma 03/09/2017   Vitamin D deficiency 03/09/2017   Generalized anxiety disorder 05/23/2016   Esophageal motility disorder 05/23/2016   Depressive disorder 05/23/2016   Fibromyalgia 05/23/2016   Family history of cardiac disorder 05/23/2016   Diabetic neuropathy ([place]) 12/19/2013   Neuropathy due to type  2 diabetes mellitus ([place]) 03/17/2013   Degeneration of lumbar intervertebral disc 01/04/2013   Chronic pain syndrome 01/04/2013   Arthropathy of lumbar facet joint 01/04/2013   Syncope 02/12/2011   Diabetes mellitus ([place]) 01/04/2011   Chronic pain 01/04/2011   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 01/04/2011   Hyperlipidemia associated with type 2 diabetes mellitus ([place]) 01/04/2011   Restless legs 01/04/2011   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[place]) 01/04/2011   Palpitations 01/04/2011    Conditions to be addressed/monitored: monitor client management of anxiety issues  Care Plan : LCSW Care Plan  Updates made by Katha Cabal, LCSW since 04/08/2022 12:00 AM     Problem: Emotional Distress      Goal: Emotional Health Supported;Manage Anxiety issues; Manage Depression issues   Start Date: 12/04/2021  Expected End Date: 07/10/2022  This Visit's Progress: Not on track  Recent Progress: On track  Priority: Medium  Note:   Current Barriers:  Chronic Mental Health needs related to anxiety issues and depression issues Mobility challenges Suicidal Ideation/Homicidal Ideation: No Stomach pain  Incontinency challenges  Clinical Social Work Goal(s):  patient will work with SW monthly by telephone or in person to reduce or manage symptoms related to anxiety issues and depression issues patient will work with SW monthly to address concerns related to mobility of client and related to client completion of ADLs Client will communicate as needed in next 30 days with RNCM to discuss nursing needs of client  Interventions: 1:1 collaboration with [name], DO regarding development and update of comprehensive plan of care as evidenced by provider attestation and co-signature Discussed with client pain issues of client and sleeping challenges of client. She said she has occasional stomach pain issues Reviewed with client the swallowing challenges of client. She said she cuts her food into small bites and eats 3-4 small meals daily. She spoke of difficulty in swallowing Discussed mood status with client. She feels that she has been more anxious recently related to bowel issues and stomach issues.  She said her stomach was swollen, she was having small amount of blood discharge  (small amount). She said she felt hot sometimes. She spoke of soiling her clothes. She said she was wearing diapers related to incontinency and had been wearing diapers for about 3 weeks.  LCSW spoke today with LPN Dewain Penning, Triage Nurse at [facility] about client needs.  Dewain Penning said she would research current client status and Jan said she would call client today to talk with client about client needs.  Reviewed medication procurement of client Discussed with client her family support (grandson lives with her and is helpful to client; daughters are supportive and both daughters live nearby) Reviewed appetite of client. She eats small amounts of food. She said she eats about 4 small meals daily Provided counseling support for client Reviewed transport needs of client. She said her grandson often transports her to and from her medical appointments Reviewed ambulation needs. She said she uses a walker to help her walk  Patient Self Care Activities:  Self administers medications as prescribed  Patient Coping Strengths:  Family Friends  Patient Self Care Deficits:  Anxiety issues Depression issues Incontinency challenges  Patient Goals:  - spend time or talk with others at least 2 to 3 times per week - practice relaxation or meditation daily - keep a calendar with appointment dates  Follow Up Plan: LCSW to call client on 05/19/22 at 1:00  PM      Norva Riffle.Denesia Donelan MSW, [place] [employer] representative [facility] Care Management [phone]

## 2022-04-08 NOTE — Telephone Encounter (Signed)
Pt states she has not heard about a colonoscopy and states she has issues with her rectum not closing and it is making it where she's goes on herself. She is at a situation where she will take any recommendations you have

## 2022-04-10 ENCOUNTER — Emergency Department (HOSPITAL_COMMUNITY): Payer: Medicare Other

## 2022-04-10 ENCOUNTER — Emergency Department (HOSPITAL_COMMUNITY)
Admission: EM | Admit: 2022-04-10 | Discharge: 2022-04-10 | Disposition: A | Discharge status: Home / Self Care | Payer: Medicare Other | Attending: Emergency Medicine | Admitting: Emergency Medicine

## 2022-04-10 ENCOUNTER — Encounter (HOSPITAL_COMMUNITY): Payer: Self-pay | Admitting: Emergency Medicine

## 2022-04-10 DIAGNOSIS — K5730 Diverticulosis of large intestine without perforation or abscess without bleeding: Secondary | ICD-10-CM | POA: Diagnosis not present

## 2022-04-10 DIAGNOSIS — K5900 Constipation, unspecified: Secondary | ICD-10-CM | POA: Insufficient documentation

## 2022-04-10 DIAGNOSIS — Z7982 Long term (current) use of aspirin: Secondary | ICD-10-CM | POA: Diagnosis not present

## 2022-04-10 DIAGNOSIS — I700 Atherosclerosis of aorta: Secondary | ICD-10-CM | POA: Diagnosis not present

## 2022-04-10 DIAGNOSIS — N3090 Cystitis, unspecified without hematuria: Secondary | ICD-10-CM | POA: Diagnosis not present

## 2022-04-10 DIAGNOSIS — R1084 Generalized abdominal pain: Secondary | ICD-10-CM | POA: Diagnosis not present

## 2022-04-10 LAB — COMPREHENSIVE METABOLIC PANEL
ALT: 41 U/L (ref 0–44)
AST: 33 U/L (ref 15–41)
Albumin: 4.4 g/dL (ref 3.5–5.0)
Alkaline Phosphatase: 51 U/L (ref 38–126)
Anion gap: 12 (ref 5–15)
BUN: 20 mg/dL (ref 8–23)
CO2: 23 mmol/L (ref 22–32)
Calcium: 9.8 mg/dL (ref 8.9–10.3)
Chloride: 101 mmol/L (ref 98–111)
Creatinine, Ser: 0.83 mg/dL (ref 0.44–1.00)
GFR, Estimated: >60 mL/min (ref >60)
Glucose, Bld: 178 mg/dL — ABNORMAL HIGH (ref 70–99)
Potassium: 3.8 mmol/L (ref 3.5–5.1)
Sodium: 136 mmol/L (ref 135–145)
Total Bilirubin: 1.2 mg/dL (ref 0.3–1.2)
Total Protein: 7.4 g/dL (ref 6.5–8.1)

## 2022-04-10 LAB — CBC WITH DIFFERENTIAL/PLATELET
Abs Immature Granulocytes: 0.01 10*3/uL (ref 0.00–0.07)
Basophils Absolute: 0 10*3/uL (ref 0.0–0.1)
Basophils Relative: 0 %
Eosinophils Absolute: 0 10*3/uL (ref 0.0–0.5)
Eosinophils Relative: 0 %
HCT: 45 % (ref 36.0–46.0)
Hemoglobin: 15.2 g/dL — ABNORMAL HIGH (ref 12.0–15.0)
Immature Granulocytes: 0 %
Lymphocytes Relative: 43 %
Lymphs Abs: 2.2 10*3/uL (ref 0.7–4.0)
MCH: 28.6 pg (ref 26.0–34.0)
MCHC: 33.8 g/dL (ref 30.0–36.0)
MCV: 84.6 fL (ref 80.0–100.0)
Monocytes Absolute: 0.3 10*3/uL (ref 0.1–1.0)
Monocytes Relative: 6 %
Neutro Abs: 2.6 10*3/uL (ref 1.7–7.7)
Neutrophils Relative %: 51 %
Platelets: 171 10*3/uL (ref 150–400)
RBC: 5.32 MIL/uL — ABNORMAL HIGH (ref 3.87–5.11)
RDW: 12.1 % (ref 11.5–15.5)
WBC: 5.2 10*3/uL (ref 4.0–10.5)
nRBC: 0 % (ref 0.0–0.2)

## 2022-04-10 LAB — URINALYSIS, ROUTINE W REFLEX MICROSCOPIC
Bilirubin Urine: NEGATIVE
Glucose, UA: NEGATIVE mg/dL
Hgb urine dipstick: NEGATIVE
Ketones, ur: NEGATIVE mg/dL
Leukocytes,Ua: NEGATIVE
Nitrite: NEGATIVE
Protein, ur: NEGATIVE mg/dL
Specific Gravity, Urine: 1.004 — ABNORMAL LOW (ref 1.005–1.030)
pH: 6 (ref 5.0–8.0)

## 2022-04-10 IMAGING — CT CT ABD-PELV W/ CM
2 of 5 series · 16 of 46 positions shown, 18 images · IV contrast (agent unspecified)
Comparison: CT abdomen pelvis dated [DATE].

CLINICAL DATA: Concern for bowel obstruction.

EXAM:
CT ABDOMEN AND PELVIS WITH CONTRAST
TECHNIQUE: Multidetector CT imaging of the abdomen and pelvis was performed
using the standard protocol following bolus administration of
intravenous contrast.

[Series 3: a/p w/ 5mm · axial · 0.98mm/px · z∈[+956,+1346]mm · 13 of 88 slices shown, 15 images]
[im 5/88  soft-tissue]
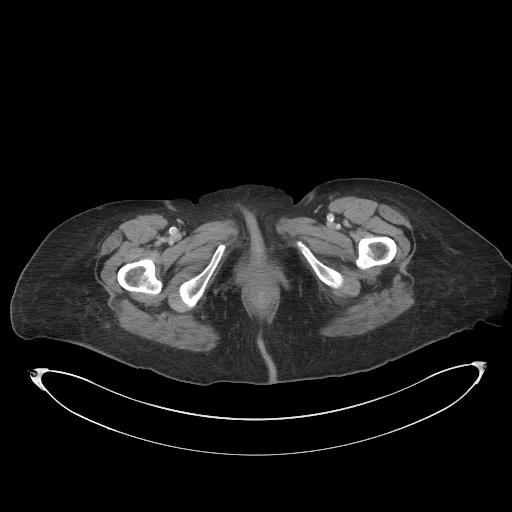
[im 5/88  bone]
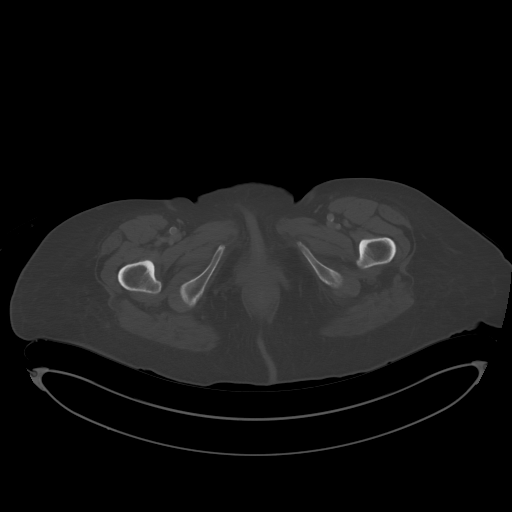
[im 14/88  soft-tissue]
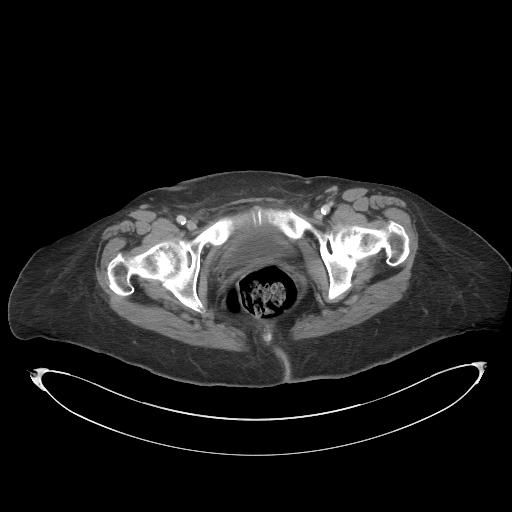
[im 19/88  soft-tissue]
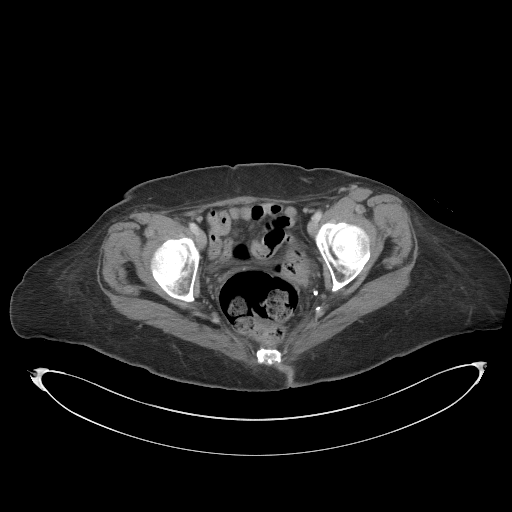
[im 23/88  soft-tissue]
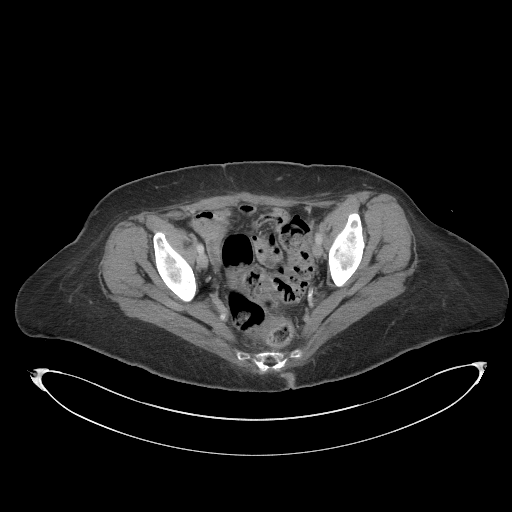
[im 33/88  soft-tissue]
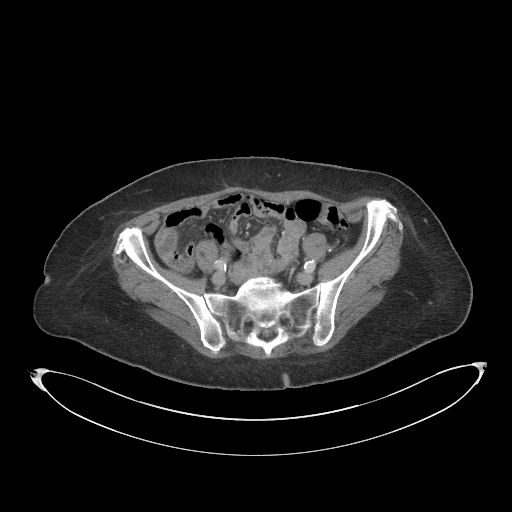
[im 37/88  soft-tissue]
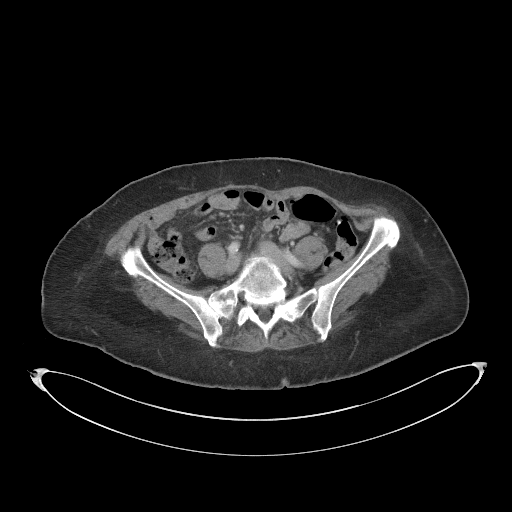
[im 46/88  soft-tissue]
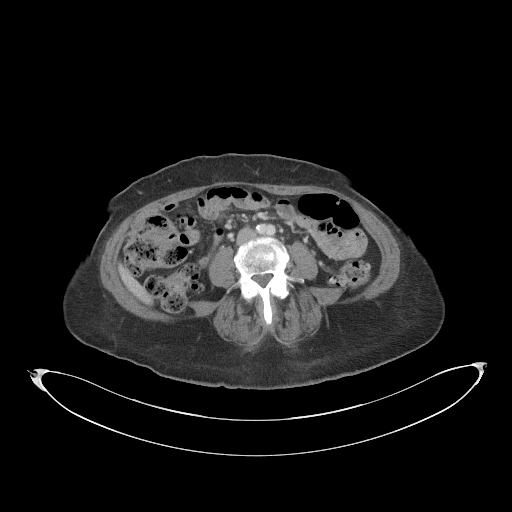
[im 51/88  soft-tissue]
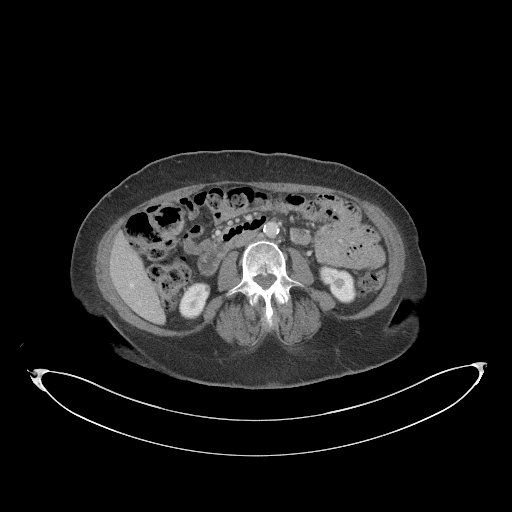
[im 55/88  soft-tissue]
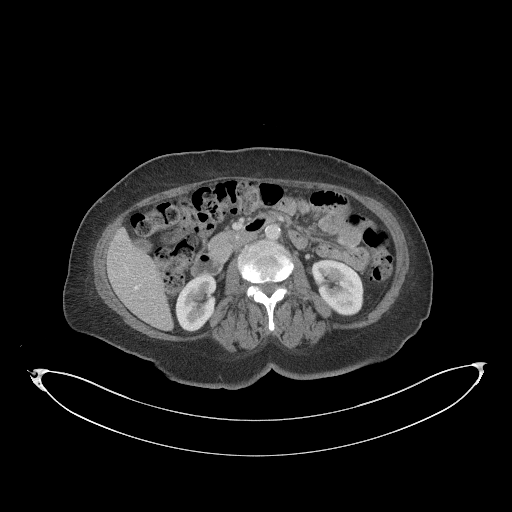
[im 55/88  bone]
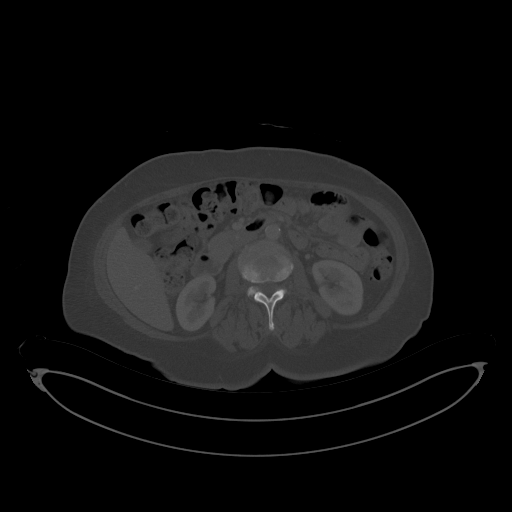
[im 65/88  soft-tissue]
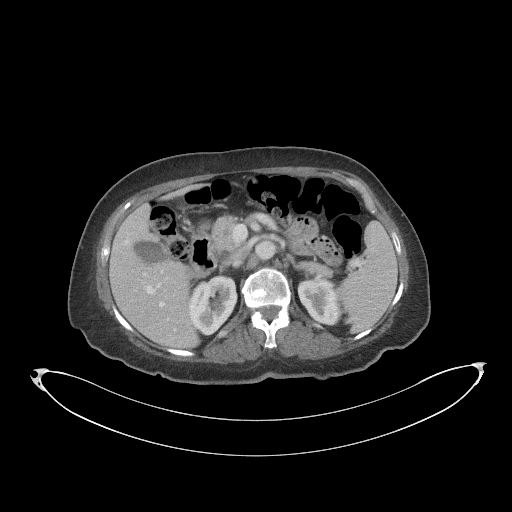
[im 69/88  soft-tissue]
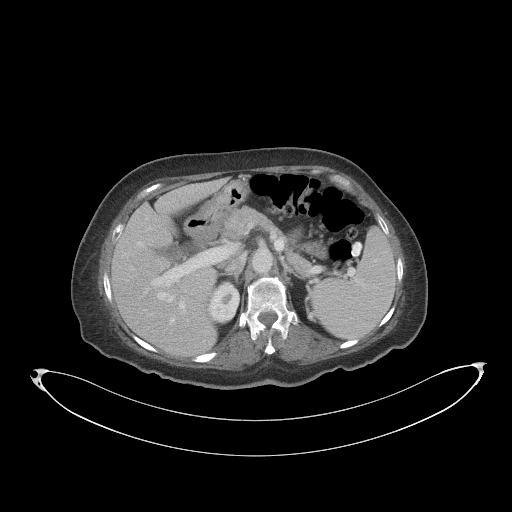
[im 74/88  soft-tissue]
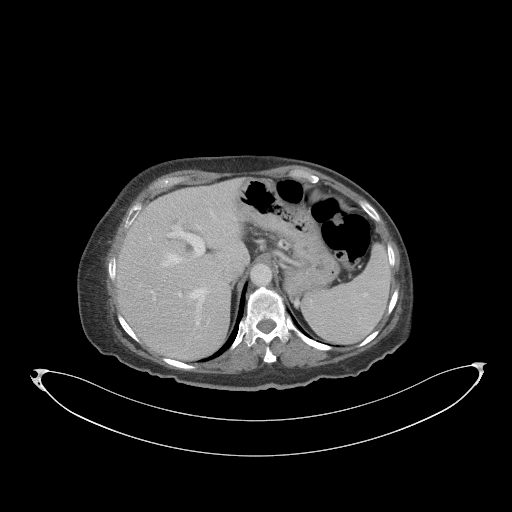
[im 83/88  soft-tissue]
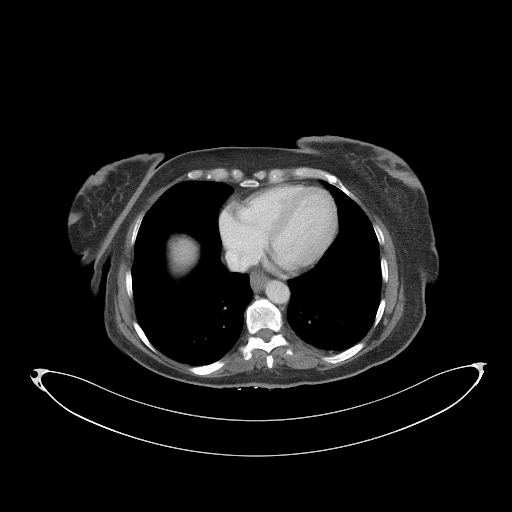

[Series 6: a/p w/ cor · coronal · 0.86mm/px · 3 of 129 slices shown]
[im 43/129  soft-tissue]
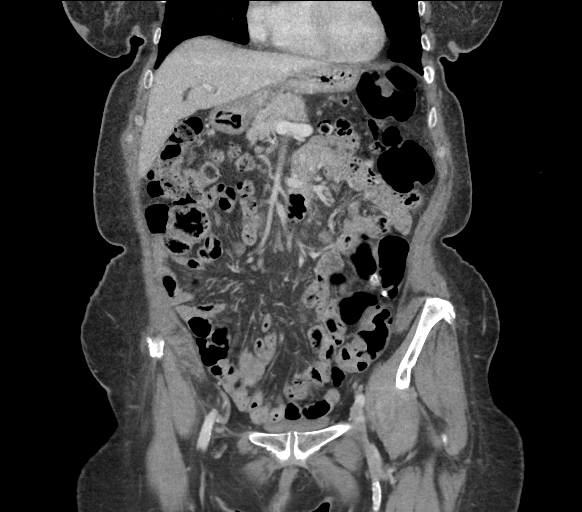
[im 57/129  soft-tissue]
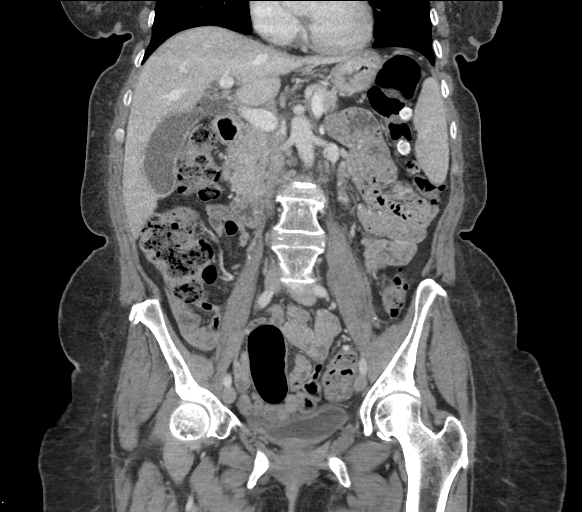
[im 72/129  soft-tissue]
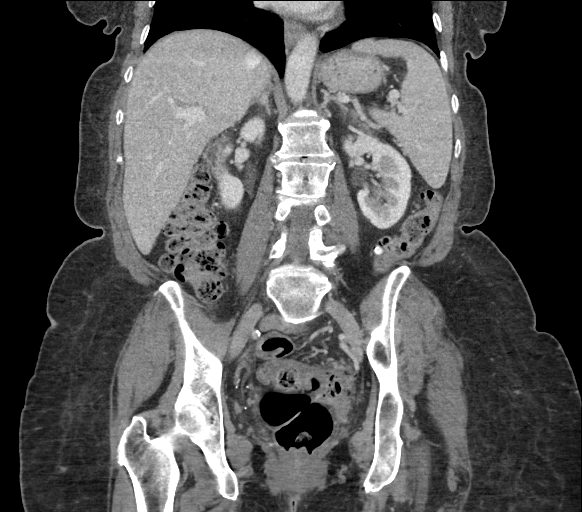

[16 of 46 positions shown; findings below may reference images not displayed]

RADIATION DOSE REDUCTION: This exam was performed according to the
departmental dose-optimization program which includes automated
exposure control, adjustment of the mA and/or kV according to
patient size and/or use of iterative reconstruction technique.

CONTRAST:  100mL OMNIPAQUE IOHEXOL 300 MG/ML  SOLN
FINDINGS: Lower chest: Minimal bibasilar dependent atelectasis. The visualized
lung bases are otherwise clear.

No intra-abdominal free air or free fluid.

Hepatobiliary: The liver is unremarkable. Mild biliary ductal
dilatation. The gallbladder is unremarkable. No calcified stone
noted in the central CBD.

Pancreas: Unremarkable. No pancreatic ductal dilatation or
surrounding inflammatory changes.

Spleen: Normal in size without focal abnormality.

Adrenals/Urinary Tract: The adrenal glands unremarkable. The
kidneys, visualized ureters, and the bladder appear unremarkable.

Stomach/Bowel: There is sigmoid diverticulosis and scattered colonic
diverticula without active inflammatory changes. There is moderate
stool throughout the colon. There is no bowel obstruction or active
inflammation. The appendix is normal.

Vascular/Lymphatic: Mild aortoiliac atherosclerotic disease. The IVC
is unremarkable. No portal venous gas. There is no adenopathy.

Reproductive: Hysterectomy.  No adnexal masses.

Other: None

Musculoskeletal: Degenerative changes of the spine. No acute osseous
pathology.
IMPRESSION: 1. No acute intra-abdominal or pelvic pathology. No bowel
obstruction. Normal appendix.
2. Colonic diverticulosis.
3. Aortic Atherosclerosis ([HC]-[HC]).

## 2022-04-10 MED ORDER — IOHEXOL 300 MG/ML  SOLN
100.0000 mL | Freq: Once | INTRAMUSCULAR | Status: AC | PRN
  Administered 2022-04-10: 100 mL via INTRAVENOUS

## 2022-04-10 MED ORDER — KETOROLAC TROMETHAMINE 30 MG/ML IJ SOLN
30.0000 mg | Freq: Once | INTRAMUSCULAR | Status: AC
  Administered 2022-04-10: 30 mg via INTRAVENOUS
  Filled 2022-04-10: qty 1

## 2022-04-10 NOTE — Discharge Instructions (Addendum)
Please call your GI office to talk about your constipation plan tomorrow morning.  I recommend you start by trying a Dulcolax tablet by mouth, as well as Dulcolax rectal suppository at the same time.  Continue drinking plenty of water.  If you continue having difficulty with bowel movement you can also try magnesium citrate orally by mouth.  Consider a Fleet enema over-the-counter as well.  His over-the-counter medications.

## 2022-04-10 NOTE — ED Triage Notes (Signed)
Patient here with complaint of abdominal pain and constipation, history of multiple abdominal surgeries. Patient is alert, oriented, and in no apparent distress at this time.

## 2022-04-10 NOTE — ED Provider Notes (Incomplete)
[facility] EMERGENCY DEPARTMENT Provider Note   CSN: [id number] Arrival date & time: 04/10/22  1557     History {Add pertinent medical, surgical, social history, OB history to HPI:1} Chief Complaint  Patient presents with   Constipation    Casey Sanders is a 61 y.o. female.  HPI     Home Medications Prior to Admission medications   Medication Sig Start Date End Date Taking? Authorizing Provider  Accu-Chek Softclix Lancets lancets Please use to test blood sugar 3 times daily as directed. DX: E11.9 07/18/20   [name], DO  aspirin 81 MG EC tablet Take 81 mg by mouth daily.    [provider]  atorvastatin (LIPITOR) 80 MG tablet Take 1 tablet (80 mg total) by mouth daily at 6 PM. 03/13/22   Ronnie Doss M, DO  Blood Glucose Monitoring Suppl (ACCU-CHEK GUIDE ME) w/Device KIT Please use to test blood sugar 3 times daily as directed. DX: E11.9 07/18/20   Ronnie Doss M, DO  diclofenac sodium (VOLTAREN) 1 % GEL Apply 2 g topically 4 (four) times daily. 07/26/13   Vernie Shanks, MD  fish oil-omega-3 fatty acids 1000 MG capsule Take 2 g by mouth daily.    [provider]  glucose blood (ACCU-CHEK GUIDE) test strip Please use to test blood sugar 3 times daily as directed. DX: E11.9 07/18/20   Ronnie Doss M, DO  lisinopril (ZESTRIL) 10 MG tablet Take 1 tablet (10 mg total) by mouth daily. 03/13/22   [name], DO  mirtazapine (REMERON) 7.5 MG tablet Take 1 tablet (7.5 mg total) by mouth at bedtime. 03/13/22   [name], DO      Allergies    Codeine, Jardiance [empagliflozin], Metformin and related, and Niaspan [niacin er]    Review of Systems   Review of Systems  Physical Exam Updated Vital Signs BP (!) 153/71   Pulse 74   Temp 98.4 F (36.9 C) (Oral)   Resp 16   SpO2 99%  Physical Exam  ED Results / Procedures / Treatments   Labs (all labs ordered are listed, but only abnormal results are  displayed) Labs Reviewed  CBC WITH DIFFERENTIAL/PLATELET - Abnormal; Notable for the following components:      Result Value   RBC 5.32 (*)    Hemoglobin 15.2 (*)    All other components within normal limits  COMPREHENSIVE METABOLIC PANEL - Abnormal; Notable for the following components:   Glucose, Bld 178 (*)    All other components within normal limits  URINALYSIS, ROUTINE W REFLEX MICROSCOPIC - Abnormal; Notable for the following components:   Color, Urine STRAW (*)    Specific Gravity, Urine 1.004 (*)    All other components within normal limits    EKG None  Radiology CT ABDOMEN PELVIS W CONTRAST  Result Date: 04/10/2022 CLINICAL DATA:  Concern for bowel obstruction. EXAM: CT ABDOMEN AND PELVIS WITH CONTRAST TECHNIQUE: Multidetector CT imaging of the abdomen and pelvis was performed using the standard protocol following bolus administration of intravenous contrast. RADIATION DOSE REDUCTION: This exam was performed according to the departmental dose-optimization program which includes automated exposure control, adjustment of the mA and/or kV according to patient size and/or use of iterative reconstruction technique. CONTRAST:  152m OMNIPAQUE IOHEXOL 300 MG/ML  SOLN COMPARISON:  CT abdomen pelvis dated 02/27/2022. FINDINGS: Lower chest: Minimal bibasilar dependent atelectasis. The visualized lung bases are otherwise clear. No intra-abdominal free air or free fluid. Hepatobiliary: The liver is  unremarkable. Mild biliary ductal dilatation. The gallbladder is unremarkable. No calcified stone noted in the central CBD. Pancreas: Unremarkable. No pancreatic ductal dilatation or surrounding inflammatory changes. Spleen: Normal in size without focal abnormality. Adrenals/Urinary Tract: The adrenal glands unremarkable. The kidneys, visualized ureters, and the bladder appear unremarkable. Stomach/Bowel: There is sigmoid diverticulosis and scattered colonic diverticula without active inflammatory  changes. There is moderate stool throughout the colon. There is no bowel obstruction or active inflammation. The appendix is normal. Vascular/Lymphatic: Mild aortoiliac atherosclerotic disease. The IVC is unremarkable. No portal venous gas. There is no adenopathy. Reproductive: Hysterectomy.  No adnexal masses. Other: None Musculoskeletal: Degenerative changes of the spine. No acute osseous pathology. IMPRESSION: 1. No acute intra-abdominal or pelvic pathology. No bowel obstruction. Normal appendix. 2. Colonic diverticulosis. 3. Aortic Atherosclerosis (ICD10-I70.0). Electronically Signed   By: Anner Crete M.D.   On: 04/10/2022 22:37    Procedures Procedures  {Document cardiac monitor, telemetry assessment procedure when appropriate:1}  Medications Ordered in ED Medications  iohexol (OMNIPAQUE) 300 MG/ML solution 100 mL (100 mLs Intravenous Contrast Given 04/10/22 2225)  ketorolac (TORADOL) 30 MG/ML injection 30 mg (30 mg Intravenous Given 04/10/22 2241)    ED Course/ Medical Decision Making/ A&P                           Medical Decision Making Amount and/or Complexity of Data Reviewed Radiology: ordered.  Risk Prescription drug management.   ***  {Document critical care time when appropriate:1} {Document review of labs and clinical decision tools ie heart score, Chads2Vasc2 etc:1}  {Document your independent review of radiology images, and any outside records:1} {Document your discussion with family members, caretakers, and with consultants:1} {Document social determinants of health affecting pt's care:1} {Document your decision making why or why not admission, treatments were needed:1} Final Clinical Impression(s) / ED Diagnoses Final diagnoses:  None    Rx / DC Orders ED Discharge Orders     None

## 2022-04-18 DIAGNOSIS — E785 Hyperlipidemia, unspecified: Secondary | ICD-10-CM

## 2022-04-18 DIAGNOSIS — F32A Depression, unspecified: Secondary | ICD-10-CM

## 2022-04-18 DIAGNOSIS — I152 Hypertension secondary to endocrine disorders: Secondary | ICD-10-CM

## 2022-04-18 DIAGNOSIS — Z794 Long term (current) use of insulin: Secondary | ICD-10-CM

## 2022-04-18 DIAGNOSIS — E1159 Type 2 diabetes mellitus with other circulatory complications: Secondary | ICD-10-CM

## 2022-04-18 DIAGNOSIS — E1169 Type 2 diabetes mellitus with other specified complication: Secondary | ICD-10-CM

## 2022-05-14 ENCOUNTER — Ambulatory Visit: Payer: Medicare Other | Admitting: *Deleted

## 2022-05-14 DIAGNOSIS — Z794 Long term (current) use of insulin: Secondary | ICD-10-CM

## 2022-05-14 NOTE — Chronic Care Management (AMB) (Signed)
Care Management    RN Visit Note  05/14/2022 Name: Casey Sanders MRN: [id number] DOB: 07/30/1961  Subjective: Casey Sanders is a 61 y.o. year old female who is a primary care patient of [name], DO. The care management team was consulted for assistance with disease management and care coordination needs.    Engaged with patient by telephone for follow up visit in response to provider referral for case management and/or care coordination services.   Consent to Services:   Casey Sanders was given information about Care Management services today including:  Care Management services includes personalized support from designated clinical staff supervised by her physician, including individualized plan of care and coordination with other care providers 24/7 contact phone numbers for assistance for urgent and routine care needs. The patient may stop case management services at any time by phone call to the office staff.  Patient agreed to services and consent obtained.   Assessment: Review of patient past medical history, allergies, medications, health status, including review of consultants reports, laboratory and other test data, was performed as part of comprehensive evaluation and provision of chronic care management services.   SDOH (Social Determinants of Health) assessments and interventions performed:    Care Plan  Allergies  Allergen Reactions   Codeine Hives, Nausea And Vomiting and Other (See Comments)   Jardiance [Empagliflozin]     Caused blisters   Metformin And Related     nausea   Niaspan [Niacin Er] Other (See Comments)    Increase blood glucose    Outpatient Encounter Medications as of 05/14/2022  Medication Sig   Accu-Chek Softclix Lancets lancets Please use to test blood sugar 3 times daily as directed. DX: E11.9   aspirin 81 MG EC tablet Take 81 mg by mouth daily.   atorvastatin (LIPITOR) 80 MG tablet Take 1 tablet (80 mg total) by mouth daily at 6 PM.    Blood Glucose Monitoring Suppl (ACCU-CHEK GUIDE ME) w/Device KIT Please use to test blood sugar 3 times daily as directed. DX: E11.9   diclofenac sodium (VOLTAREN) 1 % GEL Apply 2 g topically 4 (four) times daily.   fish oil-omega-3 fatty acids 1000 MG capsule Take 2 g by mouth daily.   glucose blood (ACCU-CHEK GUIDE) test strip Please use to test blood sugar 3 times daily as directed. DX: E11.9   lisinopril (ZESTRIL) 10 MG tablet Take 1 tablet (10 mg total) by mouth daily.   mirtazapine (REMERON) 7.5 MG tablet Take 1 tablet (7.5 mg total) by mouth at bedtime.   No facility-administered encounter medications on file as of 05/14/2022.    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   Dysphagia 10/23/2021   Hematochezia 10/23/2021   Hypertensive disorder 12/14/2018   Asthma 03/09/2017   Vitamin D deficiency 03/09/2017   Generalized anxiety disorder 05/23/2016   Esophageal motility disorder 05/23/2016   Depressive disorder 05/23/2016   Fibromyalgia 05/23/2016   Family history of cardiac disorder 05/23/2016   Diabetic neuropathy ([place]) 12/19/2013   Neuropathy due to type 2 diabetes mellitus ([place]) 03/17/2013   Degeneration of lumbar intervertebral disc 01/04/2013   Chronic pain syndrome 01/04/2013   Arthropathy of lumbar facet joint 01/04/2013   Syncope 02/12/2011   Diabetes mellitus ([place]) 01/04/2011   Chronic pain 01/04/2011  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 01/04/2011   Hyperlipidemia associated with type 2 diabetes mellitus ([place]) 01/04/2011   Restless legs 01/04/2011   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[place]) 01/04/2011   Palpitations 01/04/2011    Conditions to be addressed/monitored: HTN, DMII, and GI  conditions  Care Plan : [facility] Care Plan  Updates made by Ilean China, RN since 05/14/2022 12:00 AM  Completed 05/14/2022   Problem: Chronic Disease Management Needs Resolved 05/14/2022  Priority: Medium     Long-Range Goal: Work with [facility] Regarding Care Management and Care Coordination Needs Associated with Diabetes, HTN, Anxiety, and Depression Completed 05/14/2022  Recent Progress: On track  Priority: Medium  Note:   Current Barriers:  Chronic Disease Management support and education needs related to HTN, DMII, Anxiety, and Depression  RNCM Clinical Goal(s):  Patient will work with RN Case Manager to address needs related to HTN, DMII, Anxiety, and Depression and Limited education about current disease process causing loss of appetite and discomfort* and grief over death of husband. take all medications exactly as prescribed and will call provider for medication related questions demonstrate ongoing self health care management ability through collaboration with RN Care manager, provider, and care team.  Continue to work with specialists regarding nausea, vomiting, weight loss, and palpitations  Interventions: 1:1 collaboration with primary care provider regarding development and update of comprehensive plan of care as evidenced by provider attestation and co-signature Inter-disciplinary care team collaboration (see longitudinal plan of care) Evaluation of current treatment plan related to  self management and patient's adherence to plan as established by provider Provided with RNCM contact number and encouraged to reach out as needed Discussed change in insurance coverage. Patient is confused. Provided with [facility] local office number [phone].   Diabetes:  (Status: Condition stable. Not addressed this visit.) Lab Results  Component Value Date   HGBA1C 6.1 (H) 07/04/2021   HGBA1C 6.9 02/19/2021   HGBA1C 7.4 (H) 11/28/2020   Lab Results  Component Value Date   LDLCALC 55  07/04/2021   CREATININE 0.83 04/10/2022  Assessed patient's understanding of A1c goal: <7% Reviewed and discussed medications and importance of adherence Assessed for adequate family/social support Discussed diet and appetite Can only eat small amounts at a time due to nausea/vomiting Eating about 4 small meals a day Discussed blood sugar management while sick Encouraged to continue checking blood sugar regularly and to call PCP with any readings outside of recommended range Discussed low blood sugar readings. Averaging between 80 and 129. Cardiology advised to not take Lantus since she isn't able to eat regularly. Blood sugar was 178 this morning after not taking Lantus yesterday.  Collaborated with front office staff to schedule an appt with Lottie Dawson, PharmD on 9/16 to discuss medication management Discussed importance of eating meals at regular intervals while taking on a long acting insulin Provided with RNCM contact number and encouraged to reach out as needed Assessed transportation needs. Patient is able to arrange appointments so that her family can take her.  Hypertension: (Status: Condition stable. Not addressed this visit.) Last practice recorded BP readings:  BP Readings from Last 10 Encounters:  07/04/21 138/89  06/27/21 133/84  02/19/21 125/86  Evaluation of current treatment plan related to hypertension self management and patient's adherence to plan as established by provider;   Reinforced need to check blood pressure at least 3 times per week and to notify PCP of any readings outside of recommended range Discussed recent cardiology  appointment and future testing.  Discussed echo results and pending heart monitor results Reviewed upcoming appt with cardiologist to review results and for further testing  Encouraged patient check and record blood pressure daily and PRN and to call PCP or cardiologist with any readings outside of recommended range Encouraged to keep all  medical appointments  Confirmed that patient has transportation to this appointment   Weight Loss/Nausea/Abd Pain/Decreased Appetite:  (Status: Goal on Track (progressing): YES.) Evaluation of current treatment plan related to recurrent nausea and vomiting and patient's adherence to plan as established by provider. Chart reviewed including relevant office notes, lab results, and hospitalization note Reviewed and discussed medications and compliance Discussed recent follow-up with GI and upcoming appointment with [facility] Reviewed and discussed medications.  GI provided Linzess samples and they have helped. She has been having routine bowel movements but still feels that she isn't completely emptying.  Reassessed family/social support Reviewed upcoming appointments Advised to seek appropriate medical attention for any new or worsening symptoms Encouraged to call PCP or GI with any new or worsening symptoms  Patient Goals/Self-Care Activities: Patient will self administer medications as prescribed Patient will attend all scheduled provider appointments Patient will continue to perform ADL's independently Patient will continue to perform IADL's independently Patient will call provider office for new concerns or questions Patient will talk with Care Coordination nurse on 06/11/22 Patient will reach out to [place] regarding change in insurance coverage [phone]   Plan: Telephone follow up appointment with care management team member scheduled for:  Care Coordination visit on 06/11/22 with Jackelyn Poling, RN  Chong Sicilian, BSN, RN-BC [place] / [place] Management Direct Dial: [phone]

## 2022-05-14 NOTE — Patient Instructions (Signed)
Visit Information  Thank you for taking time to visit with me today.   Following are the goals we discussed today:  Patient will self administer medications as prescribed Patient will attend all scheduled provider appointments Patient will continue to perform ADL's independently Patient will continue to perform IADL's independently Patient will call provider office for new concerns or questions Patient will talk with Care Coordination nurse on 06/11/22 Patient will reach out to [place] regarding change in insurance coverage [phone]  Your next appointment is by telephone on 06/11/22 at 11:30 with Jackelyn Poling, RN Care Coordinator  Please call the care guide team at [phone] if you need to cancel or reschedule your appointment.   If you are experiencing a Mental Health or [place] or need someone to talk to, please call the [facility]: [phone] call [phone]   Patient verbalizes understanding of instructions and care plan provided today and agrees to view in [place]. Active MyChart status and patient understanding of how to access instructions and care plan via MyChart confirmed with patient.     Chong Sicilian, BSN, RN-BC Embedded Chronic Care Manager [facility] / [place] Management Direct Dial: [phone]

## 2022-05-16 DIAGNOSIS — K862 Cyst of pancreas: Secondary | ICD-10-CM | POA: Diagnosis not present

## 2022-05-16 DIAGNOSIS — K5904 Chronic idiopathic constipation: Secondary | ICD-10-CM | POA: Diagnosis not present

## 2022-05-16 DIAGNOSIS — R140 Abdominal distension (gaseous): Secondary | ICD-10-CM | POA: Diagnosis not present

## 2022-05-19 ENCOUNTER — Ambulatory Visit: Payer: Medicare Other | Admitting: Licensed Clinical Social Worker

## 2022-05-19 DIAGNOSIS — E039 Hypothyroidism, unspecified: Secondary | ICD-10-CM

## 2022-05-19 DIAGNOSIS — E1169 Type 2 diabetes mellitus with other specified complication: Secondary | ICD-10-CM

## 2022-05-19 DIAGNOSIS — F419 Anxiety disorder, unspecified: Secondary | ICD-10-CM

## 2022-05-19 DIAGNOSIS — F411 Generalized anxiety disorder: Secondary | ICD-10-CM

## 2022-05-19 DIAGNOSIS — I152 Hypertension secondary to endocrine disorders: Secondary | ICD-10-CM

## 2022-05-19 DIAGNOSIS — Z87440 Personal history of urinary (tract) infections: Secondary | ICD-10-CM

## 2022-05-19 DIAGNOSIS — F32A Depression, unspecified: Secondary | ICD-10-CM

## 2022-05-19 NOTE — Chronic Care Management (AMB) (Signed)
Care Management Clinical Social Work Note  05/19/2022 Name: Casey Sanders MRN: [id number] DOB: [birth year]  Casey Sanders is a 61 y.o. year old female who is a primary care patient of [name], DO.  The Care Management team was consulted for assistance with chronic disease management and coordination needs.  Engaged with patient by telephone for follow up visit in response to provider referral for social work chronic care management and care coordination services  Consent to Services:  Ms. Dubose was given information about Care Management services today including:  Care Management services includes personalized support from designated clinical staff supervised by her physician, including individualized plan of care and coordination with other care providers 24/7 contact phone numbers for assistance for urgent and routine care needs. The patient may stop case management services at any time by phone call to the office staff.  Patient agreed to services and consent obtained.   Assessment: Review of patient past medical history, allergies, medications, and health status, including review of relevant consultants reports was performed today as part of a comprehensive evaluation and provision of chronic care management and care coordination services.  SDOH (Social Determinants of Health) assessments and interventions performed:  SDOH Interventions    Flowsheet Row Most Recent Value  SDOH Interventions   Physical Activity Interventions Other (Comments)  [walking challenges. she uses a walker to help her walk]  Stress Interventions Provide Counseling  [client has stress related to managing medical needs]  Depression Interventions/Treatment  Counseling, Medication        Advanced Directives Status: See Vynca application for related entries.  Care Plan  Allergies  Allergen Reactions   Codeine Hives, Nausea And Vomiting and Other (See Comments)   Jardiance [Empagliflozin]      Caused blisters   Metformin And Related     nausea   Niaspan [Niacin Er] Other (See Comments)    Increase blood glucose    Outpatient Encounter Medications as of 05/19/2022  Medication Sig   Accu-Chek Softclix Lancets lancets Please use to test blood sugar 3 times daily as directed. DX: E11.9   aspirin 81 MG EC tablet Take 81 mg by mouth daily.   atorvastatin (LIPITOR) 80 MG tablet Take 1 tablet (80 mg total) by mouth daily at 6 PM.   Blood Glucose Monitoring Suppl (ACCU-CHEK GUIDE ME) w/Device KIT Please use to test blood sugar 3 times daily as directed. DX: E11.9   diclofenac sodium (VOLTAREN) 1 % GEL Apply 2 g topically 4 (four) times daily.   fish oil-omega-3 fatty acids 1000 MG capsule Take 2 g by mouth daily.   glucose blood (ACCU-CHEK GUIDE) test strip Please use to test blood sugar 3 times daily as directed. DX: E11.9   lisinopril (ZESTRIL) 10 MG tablet Take 1 tablet (10 mg total) by mouth daily.   mirtazapine (REMERON) 7.5 MG tablet Take 1 tablet (7.5 mg total) by mouth at bedtime.   No facility-administered encounter medications on file as of 05/19/2022.    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   Dysphagia 10/23/2021   Hematochezia 10/23/2021   Hypertensive disorder 12/14/2018   Asthma 03/09/2017   Vitamin D deficiency 03/09/2017   Generalized anxiety disorder 05/23/2016   Esophageal motility disorder 05/23/2016   Depressive disorder 05/23/2016   Fibromyalgia 05/23/2016  Family history of cardiac disorder 05/23/2016   Diabetic neuropathy ([place]) 12/19/2013   Neuropathy due to type 2 diabetes mellitus ([place]) 03/17/2013   Degeneration of lumbar intervertebral disc 01/04/2013   Chronic pain syndrome 01/04/2013   Arthropathy of lumbar  facet joint 01/04/2013   Syncope 02/12/2011   Diabetes mellitus ([place]) 01/04/2011   Chronic pain 01/04/2011   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 01/04/2011   Hyperlipidemia associated with type 2 diabetes mellitus ([place]) 01/04/2011   Restless legs 01/04/2011   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[place]) 01/04/2011   Palpitations 01/04/2011    Conditions to be addressed/monitored: monitor client management of anxiety and depression issues  Care Plan : LCSW Care Plan  Updates made by Katha Cabal, LCSW since 05/19/2022 12:00 AM     Problem: Emotional Distress      Goal: Emotional Health Supported;Manage Anxiety issues; Manage Depression issues   Start Date: 12/04/2021  Expected End Date: 07/10/2022  This Visit's Progress: On track  Recent Progress: Not on track  Priority: Medium  Note:   Current Barriers:  Chronic Mental Health needs related to anxiety issues and depression issues Mobility challenges Suicidal Ideation/Homicidal Ideation: No Stomach pain  Incontinency challenges  Clinical Social Work Goal(s):  patient will work with SW monthly by telephone or in person to reduce or manage symptoms related to anxiety issues and depression issues patient will work with SW monthly to address concerns related to mobility of client and related to client completion of ADLs Client will communicate with [place] Management Nurse on 06/11/22 as scheduled to discuss nursing needs of client  Interventions: 1:1 collaboration with [name], DO regarding development and update of comprehensive plan of care as evidenced by provider attestation and co-signature Discussed with client sleeping challenges of client. She said she is sleeping better now. She said she gets about 6 hours of sleep at night Reviewed with client the swallowing challenges of client. She said she cuts her food into small bites and eats 3-4 small meals daily. She spoke of  difficulty in swallowing Discussed mood status with client. She feels that her mood is doing a little better at present since she started taking sleep aid medication.  She feels that though anxiety issues are not all resolved , she feels that she has had some improvement in her general mood status. She has good support from her daughters who live nearby. She has support from her grandson who resides with her and helps her with daily tasks.  Reviewed ADLs completion. She spoke of having walk in shower and that this type of shower is helpful to her. She did not mention any problems with ADLs completion Reviewed medication procurement Discussed transport needs. She said her family transports her to and from her medical appointments. LCSW talked with client about her participation in Care Management Program.       RNCM Chong Sicilian has talked with client about this program. RNCM Joellyn Quails is  scheduled to call client on 06/11/22 for Care Management Nursing call with client.   LCSW also talked with Pamala Hurry about LCSW support through Care Management program with Nat Christen LCSW.  Risha agreed to Care Management support  LCSW Theadore Nan is discharging client today from [place] since client has met goals. Client to begin participating in Care Management Support program with [place] LCSW thanked [place] for her participation in [place] program support in recent months.  Patient Self Care Activities:  Self administers medications as prescribed  Patient Coping Strengths:  Family Friends  Patient Self Care Deficits:  Anxiety issues Depression issues Incontinency challenges  Patient Goals:  - spend time or talk with others at least 2 to 3 times per week - practice relaxation or meditation daily - keep a calendar with appointment dates  Follow Up Plan: LCSW is discharging client today from [place]. Client to begin participating in Care Management program with [facility]. Client agreed to  this plan.      Norva Riffle.Neema Barreira MSW, [place] Holiday representative [facility] Care Management [phone]

## 2022-05-19 NOTE — Patient Instructions (Signed)
Visit Information  Thank you for taking time to visit with me today. Please don't hesitate to contact me if I can be of assistance to you before our next scheduled telephone appointment.  Following are the goals we discussed today:   LCSW is discharging client today from [place] . Client to begin participating in Care Management program with [facility]  Please call the care guide team at [phone] if you need to cancel or reschedule your appointment.   If you are experiencing a Mental Health or [place] or need someone to talk to, please call the [facility]: [phone]   Following is a copy of your full plan of care:  Care Plan : [place]  Updates made by Katha Cabal, LCSW since 05/19/2022 12:00 AM     Problem: Emotional Distress      Goal: Emotional Health Supported;Manage Anxiety issues; Manage Depression issues   Start Date: 12/04/2021  Expected End Date: 07/10/2022  This Visit's Progress: On track  Recent Progress: Not on track  Priority: Medium  Note:   Current Barriers:  Chronic Mental Health needs related to anxiety issues and depression issues Mobility challenges Suicidal Ideation/Homicidal Ideation: No Stomach pain  Incontinency challenges  Clinical Social Work Goal(s):  patient will work with SW monthly by telephone or in person to reduce or manage symptoms related to anxiety issues and depression issues patient will work with SW monthly to address concerns related to mobility of client and related to client completion of ADLs Client will communicate with [place] Management Nurse on 06/11/22 as scheduled to discuss nursing needs of client  Interventions: 1:1 collaboration with [name], DO regarding development and update of comprehensive plan of care as evidenced by provider attestation and co-signature Discussed with client sleeping challenges of client. She said she is sleeping better now. She said  she gets about 6 hours of sleep at night Reviewed with client the swallowing challenges of client. She said she cuts her food into small bites and eats 3-4 small meals daily. She spoke of difficulty in swallowing Discussed mood status with client. She feels that her mood is doing a little better at present since she started taking sleep aid medication.  She feels that though anxiety issues are not all resolved , she feels that she has had some improvement in her general mood status. She has good support from her daughters who live nearby. She has support from her grandson who resides with her and helps her with daily tasks.  Reviewed ADLs completion. She spoke of having walk in shower and that this type of shower is helpful to her. She did not mention any problems with ADLs completion Reviewed medication procurement Discussed transport needs. She said her family transports her to and from her medical appointments. LCSW talked with client about her participation in Care Management Program.       RNCM Chong Sicilian has talked with client about this program. RNCM Joellyn Quails is  scheduled to call client on 06/11/22 for Care Management Nursing call with client.   LCSW also talked with Pamala Hurry about LCSW support through Care Management program with Nat Christen LCSW.  [place] agreed to Care Management support  LCSW Theadore Nan is discharging client today from [place] since client has met goals. Client to begin participating in Care Management Support program with [facility] LCSW thanked [place] for her participation in [place] program support in recent months.  Patient Self Care  Activities:  Self administers medications as prescribed  Patient Coping Strengths:  Family Friends  Patient Self Care Deficits:  Anxiety issues Depression issues Incontinency challenges  Patient Goals:  - spend time or talk with others at least 2 to 3 times per week - practice relaxation or meditation daily - keep a  calendar with appointment dates  Follow Up Plan: LCSW is discharging client today from [place]. Client to begin participating in Care Management program with [facility]. Client agreed to this plan.    Ms. Tindol was given information about Care Management services by the embedded care coordination team including:  Care Management services include personalized support from designated clinical staff supervised by her physician, including individualized plan of care and coordination with other care providers 24/7 contact phone numbers for assistance for urgent and routine care needs. The patient may stop CCM services at any time (effective at the end of the month) by phone call to the office staff.  Patient agreed to services and verbal consent obtained.   Norva Riffle.Jacqueline Spofford MSW, [place] representative [facility] Care Management [phone]

## 2022-05-20 ENCOUNTER — Encounter: Payer: Medicare Other | Admitting: *Deleted

## 2022-06-09 ENCOUNTER — Other Ambulatory Visit: Payer: Self-pay | Admitting: *Deleted

## 2022-06-09 ENCOUNTER — Ambulatory Visit: Payer: Self-pay | Admitting: *Deleted

## 2022-06-09 ENCOUNTER — Encounter: Payer: Self-pay | Admitting: *Deleted

## 2022-06-10 ENCOUNTER — Encounter: Payer: Self-pay | Admitting: *Deleted

## 2022-06-10 NOTE — Patient Instructions (Signed)
Visit Information  Thank you for taking time to visit with me today. Please don't hesitate to contact me if I can be of assistance to you.   Following are the goals we discussed today:   Goals Addressed               This Visit's Progress     Reduce and Manage Symptoms of Anxiety, Depression and Grief. (pt-stated)   On track     Care Coordination Interventions:  PHQ2/PHQ9 Depression Screening Tool Completed & Results Reviewed.   Suicidal Ideation/Homicidal Ideation Assessed - None Present. Solution-Focused Strategies Employed. Deep Breathing Exercises, Relaxation Techniques & Mindfulness Meditation Strategies Taught & Encouraged Daily.   Active Listening/Reflection Utilized.  Emotional Support Provided. Verbalization of Feelings Encouraged.  Problem Solving/Task-Centered Solutions Developed.   Provided Psychoeducation for Mental Health Concerns. Provided Brief Cognitive Behavioral Therapy. Reviewed Mental Health Medications & Discussed Importance of Compliance. Quality of Sleep Assessed & Sleep Hygiene Techniques Promoted. Participation in Counseling Emphasized. Participation in Grief and Loss Support Group Reviewed. Discussed Referral to Psychiatrist for Psychotropic Medication Management. Discussed Referral to Therapist for Psychotherapeutic Counseling Services.         Our next appointment is by telephone on 06/30/2022 at 12:45 pm.  Please call the care guide team at [phone] if you need to cancel or reschedule your appointment.   If you are experiencing a Mental Health or [place] or need someone to talk to, please call the Suicide and Crisis Lifeline: 988 call the [place] National Suicide Prevention Lifeline: 5091427089 or TTY: 9345115502 TTY (313) 064-9608) to talk to a trained counselor call 1-800-273-TALK (toll free, 24 hour hotline) go to [facility] [address], [place] [phone]) call the  [place]: [phone] call 911  Patient verbalizes understanding of instructions and care plan provided today and agrees to view in [place]. Active MyChart status and patient understanding of how to access instructions and care plan via MyChart confirmed with patient.     Telephone follow up appointment with care management team member scheduled for: 06/30/2022 at 12:45 pm.  Nat Christen, BSW, MSW, [place]  Licensed Clinical Social Worker  [place]  Mailing [place]. [address] [place], [place] [address] Physical Address-[address], [place], [place] [address] Toll Free Main # [phone] Fax # [phone] Cell # [phone] [email or web address]

## 2022-06-10 NOTE — Patient Outreach (Signed)
  Care Coordination   Initial Visit Note   06/10/2022  Name: Casey Sanders MRN: [id number] DOB: [birth year]  Casey Sanders is a 61 y.o. year old female who sees Casey Norlander, DO for primary care. I spoke with Casey Sanders by phone today.  What matters to the patients health and wellness today?  Reduce and Manage Symptoms of Anxiety, Depression and Grief.   Goals Addressed               This Visit's Progress     Reduce and Manage Symptoms of Anxiety, Depression and Grief. (pt-stated)   On track     Care Coordination Interventions:  PHQ2/PHQ9 Depression Screening Tool Completed & Results Reviewed.   Suicidal Ideation/Homicidal Ideation Assessed - None Present. Solution-Focused Strategies Employed. Deep Breathing Exercises, Relaxation Techniques & Mindfulness Meditation Strategies Taught & Encouraged Daily.   Active Listening/Reflection Utilized.  Emotional Support Provided. Verbalization of Feelings Encouraged.  Problem Solving/Task-Centered Solutions Developed.   Provided Psychoeducation for Mental Health Concerns. Provided Brief Cognitive Behavioral Therapy. Reviewed Mental Health Medications & Discussed Importance of Compliance. Quality of Sleep Assessed & Sleep Hygiene Techniques Promoted. Participation in Counseling Emphasized. Participation in Grief and Loss Support Group Reviewed. Discussed Referral to Psychiatrist for Psychotropic Medication Management. Discussed Referral to Therapist for Psychotherapeutic Counseling Services.         SDOH assessments and interventions completed:  Yes.    SDOH Interventions Today    Flowsheet Row Most Recent Value  SDOH Interventions   Food Insecurity Interventions Intervention Not Indicated  Financial Strain Interventions Intervention Not Indicated  Housing Interventions Intervention Not Indicated  Physical Activity Interventions Patient Refused  Stress Interventions Offered Community Wellness Resources, Provide  Counseling  Social Connections Interventions Intervention Not Indicated  Transportation Interventions Intervention Not Indicated  Depression Interventions/Treatment  Referral to Psychiatry, Medication, Counseling        Care Coordination Interventions Activated:  Yes.   Care Coordination Interventions:  Yes, provided.   Follow up plan: Follow up call scheduled for 06/30/2022 at 12:45 pm.  Encounter Outcome:  Pt. Visit Completed.    Casey Sanders, BSW, MSW, LCSW  Licensed Education officer, [facility]  Mailing [place] [address] [place], [place] [address] Physical Address-[address] [place], [place] [address] Toll Free Main # [phone] Fax # [phone] Cell # [phone] [email or web address]

## 2022-06-11 ENCOUNTER — Ambulatory Visit: Payer: Self-pay | Admitting: *Deleted

## 2022-06-11 ENCOUNTER — Encounter: Payer: Self-pay | Admitting: *Deleted

## 2022-06-11 DIAGNOSIS — I10 Essential (primary) hypertension: Secondary | ICD-10-CM

## 2022-06-11 DIAGNOSIS — K5904 Chronic idiopathic constipation: Secondary | ICD-10-CM | POA: Insufficient documentation

## 2022-06-11 DIAGNOSIS — F32A Depression, unspecified: Secondary | ICD-10-CM

## 2022-06-11 DIAGNOSIS — F411 Generalized anxiety disorder: Secondary | ICD-10-CM

## 2022-06-11 DIAGNOSIS — K224 Dyskinesia of esophagus: Secondary | ICD-10-CM

## 2022-06-11 NOTE — Patient Outreach (Addendum)
Care Coordination   Initial Visit Note   06/11/2022 Name: Damyiah Moxley MRN: [id number] DOB: September 20, 1961  Azuree Minish is a 61 y.o. year old female who sees [name], DO for primary care. I spoke with  [name] by phone today  What matters to the patients health and wellness today?  Manage hypertension/reports not having a blood pressure cuff, social determinants of health (SDOH) needs reported related to finances, food insecurity, anxiety/grief (active with [facility] SW J Saporito), manage reported memory concerns    Goals Addressed               This Visit's Progress     Patient Stated     Manage hypertension ([facility] ) (pt-stated)   Not on track     Care Coordination Interventions: Evaluation of current treatment plan related to hypertension self management and patient's adherence to plan as established by provider Discussed plans with patient for ongoing care management follow up and provided patient with direct contact information for care management team Advised patient, providing education and rationale, to monitor blood pressure daily and record, calling PCP for findings outside established parameters Screening for signs and symptoms of depression related to chronic disease state  Assessed social determinant of health barriers Discussed possible assistance to obtain equipment to monitor blood pressure at home Sent online my chart education on how to take your blood pressure       manage memory concerns [facility]) (pt-stated)   On track     06/11/22 Care Coordination Interventions: Discussed plans with patient for ongoing care management follow up and provided patient with direct contact information for care management team Screening for signs and symptoms of depression related to chronic disease state  Assessed social determinant of health barriers Encouraged continued engagement with [facility] SW, inquired about neurology services.   Encouraged to continue to write down  information in her notebook to assist with recall at a later time. Encourage routine. Support provided      Receive any available resources for financial strain, food insecurity (pt-stated)   Not on track     Care Coordination Interventions: Provided patient and/or caregiver with local  information about resources for food insecurity, financial strain Advice worker) Discussed plans with patient for ongoing care management follow up and provided patient with direct contact information for care management team Screening for signs and symptoms of depression related to chronic disease state  Assessed social determinant of health barriers Referral to [facility] care guide for local resources for food insecurity, financial strain Completed a request for patient to receive a free dual plan guide be sent to the listed patient e-mail. This will allow her to get to know all the Ucard, food, OTC and utility credit available to her from her united healthcare D SNP plan. Sent online my chart education on food insecurity        SDOH assessments and interventions completed:  Yes  SDOH Interventions Today    Flowsheet Row Most Recent Value  SDOH Interventions   Food Insecurity Interventions [id number] Referral, Other (Comment)  [to complete [facility] care guide referral for resources and discuss with [facility] SW during [facility] pod case discussion]  Financial Strain Interventions [id number] Referral, Other (Comment)  [to complete a care guide referral and discuss with [place] Interventions Intervention Not Indicated  Social Connections Interventions Intervention Not Indicated  Transportation Interventions Intervention Not Indicated  Depression Interventions/Treatment  Referral to Psychiatry, Medication, Counseling        Care  Coordination Interventions Activated:  Yes  Care Coordination Interventions:  Yes, provided   Follow up plan: Follow up call scheduled for 06/25/22 1130    Encounter Outcome:  Pt.  Visit Completed   Joseangel Nettleton L. Lavina Hamman, RN, BSN, [place] Coordinator Office number [phone]

## 2022-06-11 NOTE — Patient Instructions (Addendum)
Visit Information  Thank you for taking time to visit with me today. Please don't hesitate to contact me if I can be of assistance to you.   Following are the goals we discussed today:   Goals Addressed               This Visit's Progress     Patient Stated     Manage hypertension ([facility] ) (pt-stated)   Not on track     Care Coordination Interventions: Evaluation of current treatment plan related to hypertension self management and patient's adherence to plan as established by provider Discussed plans with patient for ongoing care management follow up and provided patient with direct contact information for care management team Advised patient, providing education and rationale, to monitor blood pressure daily and record, calling PCP for findings outside established parameters Screening for signs and symptoms of depression related to chronic disease state  Assessed social determinant of health barriers Discussed possible assistance to obtain equipment to monitor blood pressure at home Sent online my chart education on how to take your blood pressure       manage memory concerns [facility]) (pt-stated)   On track     06/11/22 Care Coordination Interventions: Discussed plans with patient for ongoing care management follow up and provided patient with direct contact information for care management team Screening for signs and symptoms of depression related to chronic disease state  Assessed social determinant of health barriers Encouraged continued engagement with [facility] SW, inquired about neurology services.   Encouraged to continue to write down information in her notebook to assist with recall at a later time. Encourage routine. Support provided      Receive any available resources for financial strain, food insecurity (pt-stated)   Not on track     Care Coordination Interventions: Provided patient and/or caregiver with local  information about resources for food insecurity, financial strain  Advice worker) Discussed plans with patient for ongoing care management follow up and provided patient with direct contact information for care management team Screening for signs and symptoms of depression related to chronic disease state  Assessed social determinant of health barriers Referral to [facility] care guide for local resources for food insecurity, financial strain Completed a request for patient to receive a free dual plan guide be sent to the listed patient e-mail. This will allow her to get to know all the Ucard, food, OTC and utility credit available to her from her united healthcare D SNP plan. Sent online my chart education on food insecurity        Our next appointment is by telephone on 06/25/22 at 1130  Please call the care guide team at [phone] if you need to cancel or reschedule your appointment.   If you are experiencing a Mental Health or [place] or need someone to talk to, please call the Suicide and Crisis Lifeline: 988 call the [place] National Suicide Prevention Lifeline: 575-422-4267 or TTY: 4253709448 TTY 416-611-0257) to talk to a trained counselor call 1-800-273-TALK (toll free, 24 hour hotline) call the [facility]: [phone] call 911   Patient verbalizes understanding of instructions and care plan provided today and agrees to view in [place]. Active MyChart status and patient understanding of how to access instructions and care plan via MyChart confirmed with patient.     The patient has been provided with contact information for the care management team and has been advised to call with any health related questions or concerns.  [place] Lavina Hamman, RN, BSN, [place] Coordinator Office number [phone]

## 2022-06-16 ENCOUNTER — Telehealth: Payer: Self-pay

## 2022-06-16 NOTE — Telephone Encounter (Signed)
   Telephone encounter was:  Successful.  06/16/2022 Name: Lauralie Blacksher MRN: [id number] DOB: November 04, 1960  Ahliya Glatt is a 61 y.o. year old female who is a primary care patient of [name], DO . The community resource team was consulted for assistance with Food Insecurity and utilities.  Care guide performed the following interventions: Spoke with patient about sending a referral via [place] for food stamps and [place] program.  Patient gave her consent and I will mail additional information for food and utilities.  Follow Up Plan:  Care guide will follow up with patient by phone over the next 7 days.  Carlo Lorson, AAS Paralegal, [place] Management  [address] [place], [place] [address] ??[email or web address]  ?? [phone]   [email or web address]

## 2022-06-18 ENCOUNTER — Telehealth: Payer: Self-pay

## 2022-06-18 NOTE — Telephone Encounter (Signed)
   Telephone encounter was:  Successful.  06/18/2022 Name: Casey Sanders MRN: [id number] DOB: [birth year]  Casey Sanders is a 61 y.o. year old female who is a primary care patient of [name], DO . The community resource team was consulted for assistance with Food Insecurity and Financial Difficulties related to utilities.  Care guide performed the following interventions: Spoke with patient to inform her the referrals sent to [facility] and [employer] have been accepted.  Patient stated that she has been contacted and they are assisting her.  Gave her my name and number to call if she does not receive the mailed resources for food and utility assistance.   Follow Up Plan:  No further follow up planned at this time. The patient has been provided with needed resources.  [name], AAS Paralegal, [place] Management  [address], [place] [address] ??[email or web address]  ?? [phone]   [email or web address]

## 2022-06-25 ENCOUNTER — Encounter: Payer: Self-pay | Admitting: *Deleted

## 2022-06-25 ENCOUNTER — Ambulatory Visit: Payer: Self-pay | Admitting: *Deleted

## 2022-06-25 NOTE — Patient Instructions (Signed)
Visit Information  Thank you for taking time to visit with me today. Please don't hesitate to contact me if I can be of assistance to you.   Following are the goals we discussed today:   Goals Addressed               This Visit's Progress     Patient Stated     Manage Diabetes [facility]) (pt-stated)   Not on track     Care Coordination Interventions: Discussed plans with patient for ongoing care management follow up and provided patient with direct contact information for care management team Assisted patient with outreaching to [facility] healthcare pharmacy staff, [name] to confirm she does have a benefit for a continuous glucose meter (not Dexcom)  Patient is awaiting a new united healthcare insurance card to be mailed to her After the card arrives she and RN CM will continue to work on this need Discussed the availability of obtaining a continuous glucose meter with assist of her pcp and insurance coverage      Manage hypertension ([facility] ) (pt-stated)   On track     Care Coordination Interventions: Evaluation of current treatment plan related to hypertension self management and patient's adherence to plan as established by provider Discussed plans with patient for ongoing care management follow up and provided patient with direct contact information for care management team Advised patient, providing education and rationale, to monitor blood pressure daily and record, calling PCP for findings outside established parameters Screening for signs and symptoms of depression related to chronic disease state  Assessed social determinant of health barriers Discussed possible assistance to obtain equipment to monitor blood pressure at home Sent online my chart education on how to take your blood pressure  Outreach with patient to united healthcare customer service, [name] to place an order for a blood pressure cuff       manage memory concerns [facility]) (pt-stated)   On track     06/11/22 Care  Coordination Interventions: Discussed plans with patient for ongoing care management follow up and provided patient with direct contact information for care management team Screening for signs and symptoms of depression related to chronic disease state  Assessed social determinant of health barriers Encouraged continued engagement with [facility] SW, inquired about neurology services.   Assisted patient with a conference call to united health care customer services to access her over the counter/U card benefits to order a talking BP monitor. It will be delivered in 3-5 business days per Lenior Encouraged to continue to write down information in her notebook to assist with recall at a later time. Encourage routine. Support provided Discussed use of a tape recorder to also assist with remembering events etc Assessed again for support system  Frequently informed patient of RN CM name and title      Receive any available resources for financial strain, food insecurity [facility]) (pt-stated)   On track     Care Coordination Interventions: Provided patient and/or caregiver with local  information about resources for food insecurity, financial strain Advice worker) Discussed plans with patient for ongoing care management follow up and provided patient with direct contact information for care management team Assessed social determinant of health barriers Confirmed outreach and ongoing collaboration with [facility] care guide         Our next appointment is by telephone on 07/09/22 at 1130  Please call the care guide team at [phone] if you need to cancel or reschedule your appointment.   If you  are experiencing a Mental Health or [place] or need someone to talk to, please call the Suicide and Crisis Lifeline: 988 call the [place] National Suicide Prevention Lifeline: 509-026-4591 or TTY: 3610605257 TTY 580-449-8916) to talk to a trained counselor call 1-800-273-TALK (toll free, 24 hour  hotline) call the [facility]: [phone] call 911   Patient verbalizes understanding of instructions and care plan provided today and agrees to view in [place]. Active MyChart status and patient understanding of how to access instructions and care plan via MyChart confirmed with patient.     The patient has been provided with contact information for the care management team and has been advised to call with any health related questions or concerns.   [place] Lavina Hamman, RN, BSN, [place] Coordinator Office number [phone]

## 2022-06-25 NOTE — Patient Outreach (Signed)
Care Coordination   Follow Up Visit Note   06/25/2022 Name: Casey Sanders MRN: [id number] DOB: 02-Nov-1960  Casey Sanders is a 61 y.o. year old female who sees Casey Norlander, DO for primary care. I spoke with  Casey Sanders by phone today.  What matters to the patients health and wellness today?  Confirms [facility] care guide outreach  Need continuous glucose meter as she reports her fingers hurt from use of her present glucose meter Has a U card with $50  Has not received her new Casey Sanders insurance card   Goals Addressed               This Visit's Progress     Patient Stated     Manage Diabetes Casey Sanders) (pt-stated)   Not on track     Care Coordination Interventions: Discussed plans with patient for ongoing care management follow up and provided patient with direct contact information for care management team Assisted patient with outreaching to Casey Sanders Sanders pharmacy staff, Casey Sanders to confirm she does have a benefit for a continuous glucose meter (not Dexcom)  Patient is awaiting a new Casey Sanders insurance card to be mailed to her After the card arrives she and RN CM will continue to work on this need Discussed the availability of obtaining a continuous glucose meter with assist of her pcp and insurance coverage      Manage hypertension ([facility] ) (pt-stated)   On track     Care Coordination Interventions: Evaluation of current treatment plan related to hypertension self management and patient's adherence to plan as established by provider Discussed plans with patient for ongoing care management follow up and provided patient with direct contact information for care management team Advised patient, providing education and rationale, to monitor blood pressure daily and record, calling PCP for findings outside established parameters Screening for signs and symptoms of depression related to chronic disease state  Assessed social determinant of health barriers Discussed  possible assistance to obtain equipment to monitor blood pressure at home Sent online my chart education on how to take your blood pressure  Outreach with patient to Casey Sanders customer service, Casey Sanders to place an order for a blood pressure cuff       manage memory concerns Casey Sanders) (pt-stated)   On track     06/11/22 Care Coordination Interventions: Discussed plans with patient for ongoing care management follow up and provided patient with direct contact information for care management team Screening for signs and symptoms of depression related to chronic disease state  Assessed social determinant of health barriers Encouraged continued engagement with Casey Sanders, inquired about neurology services.   Assisted patient with a conference call to [facility] health care customer services to access her over the counter/U card benefits to order a talking BP monitor. It will be delivered in 3-5 business days per Lenior Encouraged to continue to write down information in her notebook to assist with recall at a later time. Encourage routine. Support provided Discussed use of a tape recorder to also assist with remembering events etc Assessed again for support system  Frequently informed patient of RN CM name and title      Receive any available resources for financial strain, food insecurity Casey Sanders) (pt-stated)   On track     Care Coordination Interventions: Provided patient and/or caregiver with local  information about resources for food insecurity, financial strain Advice worker) Discussed plans with patient for ongoing care management follow up and provided patient with direct contact  information for care management team Assessed social determinant of health barriers Confirmed outreach and ongoing collaboration with Casey Sanders care guide         SDOH assessments and interventions completed:  Yes  SDOH Interventions Today    Flowsheet Row Most Recent Value  SDOH Interventions   Housing  Interventions Intervention Not Indicated  Transportation Interventions Intervention Not Indicated        Care Coordination Interventions Activated:  Yes  Care Coordination Interventions:  Yes, provided   Follow up plan: Follow up call scheduled for 07/09/22 1130    Encounter Outcome:  Pt. Visit Completed   Casey Sanders L. Lavina Hamman, RN, BSN, [place] Coordinator Office number [phone]

## 2022-06-26 ENCOUNTER — Telehealth: Payer: Self-pay | Admitting: *Deleted

## 2022-06-26 NOTE — Patient Outreach (Signed)
  Care Coordination   Care coordination   Visit Note   06/26/2022 Name: Arria Naim MRN: [id number] DOB: 05-15-1961  Conny Situ is a 61 y.o. year old female who sees [name], DO for primary care. I  spoke with Suzie Portela [facility]) pharmacy staff    What matters to the patients health and wellness today?  Process to obtain a continuous glucose meter RN CM informed that the pcp can write a prescription for both the Dexcom and free style Libre to be given to [facility] also confirmed there will need to be a pre authorization    Goals Addressed   None     SDOH assessments and interventions completed:  No     Care Coordination Interventions Activated:  Yes  Care Coordination Interventions:  Yes, provided   Follow up plan: Follow up call scheduled for 07/09/22 1130     Encounter Outcome:  Pt. Visit Completed   [place] L. Lavina Hamman, RN, BSN, [place] Coordinator Office number [phone]

## 2022-06-27 ENCOUNTER — Telehealth: Payer: Self-pay

## 2022-06-27 NOTE — Telephone Encounter (Signed)
-----   Message from [name], DO sent at 06/27/2022 12:49 PM EDT ----- Regarding: FW: Prescriptions for continuous glucose meter Can we get her scheduled for DM recheck?  Haven't had an a1c on her in a while and it looks like she wants a CGM.  Hopefully, her ins will cover since she is not treated with any meds for DM. She is Due for foot exam, etc. And recheck on mirtazapine, which was started this summer and for some reason lost to follow up for that as well    ----- Message ----- From: Barbaraann Faster, RN Sent: 06/26/2022   9:48 AM EDT To: [name], DO Subject: Prescriptions for continuous glucose meter     Dr Lajuana Ripple  Mrs Canelo discussed a continuous glucose meter Spoke with staff at [facility] in [place] who requests 2 separate prescriptions for dexcom & Freestyle Elenor [place] ( they were not able to find which united healthcare will allow) It will also most likely need pre authorization  Thank you in advance for helping her !  Kimberly L. Lavina Hamman, RN, BSN, [place] Coordinator Office number [phone]

## 2022-06-27 NOTE — Telephone Encounter (Signed)
Pt called and scheduled for September 18th

## 2022-06-30 ENCOUNTER — Encounter: Payer: Self-pay | Admitting: *Deleted

## 2022-06-30 ENCOUNTER — Ambulatory Visit: Payer: Self-pay | Admitting: *Deleted

## 2022-06-30 NOTE — Patient Instructions (Signed)
Visit Information  Thank you for taking time to visit with me today. Please don't hesitate to contact me if I can be of assistance to you.   Following are the goals we discussed today:   Goals Addressed               This Visit's Progress     Reduce and Manage Symptoms of Anxiety, Depression and Grief. (pt-stated)   On track     Care Coordination Interventions:  Solution-Focused Strategies Employed. Deep Breathing Exercises, Relaxation Techniques & Mindfulness Meditation Strategies Encouraged Daily.   Active Listening/Reflection Utilized.  Emotional Support Provided. Verbalization of Feelings Encouraged.  Cognitive Behavioral Therapy Initiated. Participation in Counseling Emphasized. Encouraged Participation in Grief and Loss Support.  Continue to Monitor Blood Pressure Readings with New Blood Pressure Cuff.         Our next appointment is by telephone on 07/14/2022 at 9:15 am.  Please call the care guide team at [phone] if you need to cancel or reschedule your appointment.   If you are experiencing a Mental Health or [place] or need someone to talk to, please call the Suicide and Crisis Lifeline: 988 call the [place] National Suicide Prevention Lifeline: 516-615-6735 or TTY: 917-867-6666 TTY 215-553-1579) to talk to a trained counselor call 1-800-273-TALK (toll free, 24 hour hotline) go to [facility] [address] [place] [id number]) call the [place]: [phone] call 911  Patient verbalizes understanding of instructions and care plan provided today and agrees to view in [place]. Active MyChart status and patient understanding of how to access instructions and care plan via MyChart confirmed with patient.     Telephone follow up appointment with care management team member scheduled for:  07/14/2022 at 9:15 am.  Nat Christen, BSW, MSW, [place]  Licensed Clinical Social Worker   [place]  Mailing [place]. [address], [place], [place] [address] Physical Address-[address], [place], [place] [address] Toll Free Main # [phone] Fax # [phone] Cell # [phone] [email or web address]

## 2022-06-30 NOTE — Patient Outreach (Signed)
  Care Coordination   Follow Up Visit Note   06/30/2022  Name: Casey Sanders MRN: [id number] DOB: 10-Jul-1961  Casey Sanders is a 61 y.o. year old female who sees [name], DO for primary care. I spoke with [name] by phone today.  What matters to the patients health and wellness today?  Reduce and Manage Symptoms of Anxiety, Depression and Grief.   Goals Addressed               This Visit's Progress     Reduce and Manage Symptoms of Anxiety, Depression and Grief. (pt-stated)   On track     Care Coordination Interventions:  Solution-Focused Strategies Employed. Deep Breathing Exercises, Relaxation Techniques & Mindfulness Meditation Strategies Encouraged Daily.   Active Listening/Reflection Utilized.  Emotional Support Provided. Verbalization of Feelings Encouraged.  Cognitive Behavioral Therapy Initiated. Participation in Counseling Emphasized. Encouraged Participation in Grief and Loss Support.  Continue to Monitor Blood Pressure Readings with New Blood Pressure Cuff.         SDOH assessments and interventions completed:  No.  Care Coordination Interventions Activated:  Yes.   Care Coordination Interventions:  Yes, provided.   Follow up plan: Follow up call scheduled for 07/14/2022 at 9:15 am.  Encounter Outcome:  Pt. Visit Completed.   Nat Christen, BSW, MSW, LCSW  Licensed Education officer, environmental Health System  Mailing [place] [address], [place], [place] [address] Physical Address-[address], [place], [place] [address] Toll Free Main # [phone] Fax # [phone] Cell # [phone] Di [email or web address]

## 2022-07-07 ENCOUNTER — Ambulatory Visit (INDEPENDENT_AMBULATORY_CARE_PROVIDER_SITE_OTHER): Payer: Medicare Other | Admitting: Family Medicine

## 2022-07-07 ENCOUNTER — Encounter: Payer: Self-pay | Admitting: Family Medicine

## 2022-07-07 VITALS — BP 167/84 | HR 75 | Temp 97.6°F | Ht 61.0 in | Wt 168.0 lb

## 2022-07-07 DIAGNOSIS — E785 Hyperlipidemia, unspecified: Secondary | ICD-10-CM

## 2022-07-07 DIAGNOSIS — E1159 Type 2 diabetes mellitus with other circulatory complications: Secondary | ICD-10-CM | POA: Diagnosis not present

## 2022-07-07 DIAGNOSIS — Z794 Long term (current) use of insulin: Secondary | ICD-10-CM

## 2022-07-07 DIAGNOSIS — B351 Tinea unguium: Secondary | ICD-10-CM

## 2022-07-07 DIAGNOSIS — Z1211 Encounter for screening for malignant neoplasm of colon: Secondary | ICD-10-CM

## 2022-07-07 DIAGNOSIS — I152 Hypertension secondary to endocrine disorders: Secondary | ICD-10-CM

## 2022-07-07 DIAGNOSIS — Z23 Encounter for immunization: Secondary | ICD-10-CM

## 2022-07-07 DIAGNOSIS — E1169 Type 2 diabetes mellitus with other specified complication: Secondary | ICD-10-CM

## 2022-07-07 LAB — BAYER DCA HB A1C WAIVED: HB A1C (BAYER DCA - WAIVED): 7.7 % — ABNORMAL HIGH (ref 4.8–5.6)

## 2022-07-07 MED ORDER — LISINOPRIL 20 MG PO TABS: 20.0000 mg | ORAL_TABLET | Freq: Every day | ORAL | 3 refills | Status: DC

## 2022-07-07 MED ORDER — CICLOPIROX 8 % EX SOLN: Freq: Every day | CUTANEOUS | 0 refills | Status: AC

## 2022-07-07 NOTE — Progress Notes (Signed)
Subjective: CC:DM PCP: [name], DO KPQ:AESLPNP Casey Sanders is a 61 y.o. female presenting to clinic today for:  1. Type 2 Diabetes with hypertension, hyperlipidemia:  Not currently taking any medications for her sugar as she has been diet controlled.  She is using Lipitor for her cholesterol and lisinopril 10 mg daily for her blood pressure but she notes her blood pressure has been increasing as of late has been above 150 over 70s.  Reports intermittent headache that seems to be unrelated.  Last eye exam: Up-to-date dr [place] Comment Last foot exam: Needs Last A1c:  Lab Results  Component Value Date   HGBA1C 6.1 (H) 07/04/2021   Nephropathy screen indicated?:  On ACE inhibitor Last flu, zoster and/or pneumovax:  Immunization History  Administered Date(s) Administered   Influenza Whole 10/20/2006   Influenza,inj,Quad PF,6+ Mos 08/22/2013, 09/07/2014, 07/15/2016, 08/20/2017, 07/05/2019   Moderna Sars-Covid-2 Vaccination 03/28/2020, 04/26/2020   Pneumococcal Conjugate-13 09/07/2014   Pneumococcal Polysaccharide-23 07/15/2016   Td 10/20/2002   Td (Adult), 2 Lf Tetanus Toxid, Preservative Free 10/20/2002    ROS: No chest pain, shortness of breath.  She is gaining weight on the mirtazapine and is happy about this.  Continues to suffer from GI issues that are not totally alleviated by the Linzess and this is presented an issue when it came to her colonoscopy, which still has not been performed due to intolerance of the prep   ROS: Per HPI  Allergies  Allergen Reactions   Codeine Hives, Nausea And Vomiting and Other (See Comments)    Other reaction(s): unknown   Empagliflozin     Caused blisters Other reaction(s): Not available   Metformin And Related     nausea   Niaspan [Niacin Er] Other (See Comments)    Increase blood glucose   Past Medical History:  Diagnosis Date   Allergic rhinitis    Anxiety and depression    Asthma    Benign essential HTN 01/04/2011   Chest pain     a. Myoview 9/05: no ischemia, no scar, EF 70%   Chest pain, unspecified 02/12/2011   Cystitis 02/08/2015   Diverticulosis    DM2 (diabetes mellitus, type 2) ([place])    Extremity pain 01/04/2013   Fibromyalgia    Frequent UTI    GERD (gastroesophageal reflux disease)    Hiatal Hernia   HLD (hyperlipidemia)    HTN (hypertension)    Left rotator cuff tear    Low back pain 01/04/2013   Lumbar disc disease    Nephrolithiasis    Obesity    Palpitations    monitor 9/05: NSR, sinus tachy, PVCs   Pre-operative cardiovascular examination 02/12/2011   Rectal bleeding 08/22/2013   Sepsis secondary to UTI ([place]) 08/09/2017   uterine ca dx'd 1988   per pt    Current Outpatient Medications:    Accu-Chek Softclix Lancets lancets, Please use to test blood sugar 3 times daily as directed. DX: E11.9, Disp: 100 each, Rfl: 12   aspirin 81 MG EC tablet, Take 81 mg by mouth daily., Disp: , Rfl:    atorvastatin (LIPITOR) 80 MG tablet, Take 1 tablet (80 mg total) by mouth daily at 6 PM., Disp: 90 tablet, Rfl: 3   Blood Glucose Monitoring Suppl (ACCU-CHEK GUIDE ME) w/Device KIT, Please use to test blood sugar 3 times daily as directed. DX: E11.9, Disp: 1 kit, Rfl: 0   diclofenac sodium (VOLTAREN) 1 % GEL, Apply 2 g topically 4 (four) times daily., Disp: 100 g, Rfl:  1   fish oil-omega-3 fatty acids 1000 MG capsule, Take 2 g by mouth daily., Disp: , Rfl:    glucose blood (ACCU-CHEK GUIDE) test strip, Please use to test blood sugar 3 times daily as directed. DX: E11.9, Disp: 100 each, Rfl: 12   linaclotide (LINZESS) 145 MCG CAPS capsule, 1 capsule at least 30 minutes before the first meal of the day on an empty stomach, Disp: , Rfl:    lisinopril (ZESTRIL) 10 MG tablet, Take 1 tablet (10 mg total) by mouth daily., Disp: 90 tablet, Rfl: 3   mirtazapine (REMERON) 7.5 MG tablet, Take 1 tablet (7.5 mg total) by mouth at bedtime., Disp: 90 tablet, Rfl: 3 Social History   Socioeconomic History   Marital status: Widowed     Spouse name: Not on file   Number of children: 2   Years of education: 64   Highest education level: 12th grade  Occupational History   Occupation: disabled  Tobacco Use   Smoking status: Never    Passive exposure: Never   Smokeless tobacco: Never  Vaping Use   Vaping Use: Never used  Substance and Sexual Activity   Alcohol use: No   Drug use: No   Sexual activity: Not Currently    Partners: Male  Other Topics Concern   Not on file  Social History Narrative   2 daughters and 4 grandchildren.   Husband died in 12-19-2017.   Social Determinants of Health   Financial Resource Strain: Medium Risk (06/16/2022)   Overall Financial Resource Strain (CARDIA)    Difficulty of Paying Living Expenses: Somewhat hard  Food Insecurity: Food Insecurity Present (06/16/2022)   Hunger Vital Sign    Worried About Running Out of Food in the Last Year: Sometimes true    Ran Out of Food in the Last Year: Never true  Transportation Needs: No Transportation Needs (06/25/2022)   PRAPARE - Hydrologist (Medical): No    Lack of Transportation (Non-Medical): No  Physical Activity: Inactive (06/09/2022)   Exercise Vital Sign    Days of Exercise per Week: 0 days    Minutes of Exercise per Session: 0 min  Stress: No Stress Concern Present (06/09/2022)   [place]    Feeling of Stress : Only a little  Recent Concern: Stress - Stress Concern Present (05/19/2022)   [place]    Feeling of Stress : To some extent  Social Connections: Moderately Integrated (06/11/2022)   Social Connection and Isolation Panel [NHANES]    Frequency of Communication with Friends and Family: More than three times a week    Frequency of Social Gatherings with Friends and Family: More than three times a week    Attends Religious Services: More than 4 times per year    Active  Member of [employer] or Organizations: Yes    Attends Archivist Meetings: More than 4 times per year    Marital Status: Widowed  Intimate Partner Violence: Not At Risk (06/11/2022)   Humiliation, Afraid, Rape, and Kick questionnaire    Fear of Current or Ex-Partner: No    Emotionally Abused: No    Physically Abused: No    Sexually Abused: No   Family History  Problem Relation Age of Onset   Coronary artery disease Mother    Coronary artery disease Father    Heart disease Brother    Heart disease Brother  Lung cancer Brother 53   Heart attack Brother 62       had a few heart attacks before   Heart disease Brother     Objective: Office vital signs reviewed. BP (!) 167/84   Pulse 75   Temp 97.6 F (36.4 C)   Ht '5\' 1"'  (1.549 m)   Wt 168 lb (76.2 kg)   SpO2 99%   BMI 31.74 kg/m   Physical Examination:  General: Awake, alert, obese, No acute distress HEENT: Sclera white.  Color looks better today. Cardio: regular rate and rhythm, S1S2 heard, no murmurs appreciated Pulm: clear to auscultation bilaterally, no wheezes, rhonchi or rales; normal work of breathing on room air Neuro: See diabetic foot exam  Diabetic Foot Exam - Simple   Simple Foot Form Diabetic Foot exam was performed with the following findings: Yes 07/07/2022  5:12 PM  Visual Inspection No deformities, no ulcerations, no other skin breakdown bilaterally: Yes Sensation Testing See comments: Yes Pulse Check Posterior Tibialis and Dorsalis pulse intact bilaterally: Yes Comments She has absent monofilament sensation on the left 3 digits 2 through 4 as well as the MTP sites.  Onychomycotic changes to the nails and skin of feet bilaterally.  No ulcerations appreciated      Assessment/ Plan: 61 y.o. female   Type 2 diabetes mellitus with other specified complication, with long-term current use of insulin ([place]) - Plan: Bayer DCA Hb A1c Waived, Microalbumin / creatinine urine ratio, Basic Metabolic  Panel  Screening for colon cancer - Plan: Cologuard  Onychomycosis - Plan: ciclopirox (PENLAC) 8 % solution  Hypertension associated with diabetes ([place]) - Plan: lisinopril (ZESTRIL) 20 MG tablet, Basic Metabolic Panel  Hyperlipidemia associated with type 2 diabetes mellitus ([place]) - Plan: Lipid panel  Need for immunization against influenza - Plan: Flu Vaccine QUAD 33moIM (Fluarix, Fluzone & Alfiuria Quad PF)  Sugar is now uncontrolled with A1c rising to 7.7.  She is going to try cutting back on carbs and we will reevaluate again in 3 months.  If persistently elevated, we may need to consider revisiting Amaryl or something similar.  Do not think GLP nor metformin would be a good option for this patient who suffers from chronic GI issues  Cologuard reordered whilst we wait on colonoscopy  Penlac solution prescribed for the onychomycosis noted on exam today.  Use Tinactin or Lamisil to his skin of feet  Blood pressure not controlled upon recheck.  Advance lisinopril 20 mg.  Recheck renal function in [redacted] week along with fasting lipid  Continue statin  Influenza vaccination administered  Orders Placed This Encounter  Procedures   Cologuard   Bayer DCA Hb A1c Waived   Microalbumin / creatinine urine ratio   No orders of the defined types were placed in this encounter.    AJanora Norlander DO [phone]

## 2022-07-07 NOTE — Patient Instructions (Signed)
Lamisil or Lotrimin spray to feet. I sent in a nail paint for the fungus as well.

## 2022-07-08 LAB — MICROALBUMIN / CREATININE URINE RATIO
Creatinine, Urine: 22.5 mg/dL
Microalb/Creat Ratio: 47 mg/g creat — ABNORMAL HIGH (ref 0–29)
Microalbumin, Urine: 10.5 ug/mL

## 2022-07-09 ENCOUNTER — Ambulatory Visit: Payer: Self-pay | Admitting: *Deleted

## 2022-07-09 NOTE — Patient Outreach (Signed)
  Care Coordination   07/09/2022 Name: Casey Sanders MRN: [id number] DOB: [birth year]   Care Coordination Outreach Attempts:  An unsuccessful telephone outreach was attempted today to offer the patient information about available care coordination services as a benefit of their health plan.   Follow Up Plan:  Additional outreach attempts will be made to offer the patient care coordination information and services.   Encounter Outcome:  No Answer  Care Coordination Interventions Activated:  No   Care Coordination Interventions:  No, not indicated    SIG Dondrell Loudermilk L. Lavina Hamman, RN, BSN, [place] Coordinator Office number [phone]

## 2022-07-14 ENCOUNTER — Ambulatory Visit: Payer: Self-pay | Admitting: *Deleted

## 2022-07-14 ENCOUNTER — Encounter: Payer: Self-pay | Admitting: *Deleted

## 2022-07-14 NOTE — Patient Instructions (Addendum)
Visit Information  Thank you for taking time to visit with me today. Please don't hesitate to contact me if I can be of assistance to you.   Following are the goals we discussed today:   Goals Addressed               This Visit's Progress     Patient Stated     manage memory concerns [facility]) (pt-stated)   On track     06/11/22 Care Coordination Interventions: Discussed plans with patient for ongoing care management follow up and provided patient with direct contact information for care management team Screening for signs and symptoms of depression related to chronic disease state  Assessed social determinant of health barriers Encouragement provided allowed her time to ventilate.        Patient will manage bowel elimination at home [facility]) (pt-stated)   Not on track     Care Coordination Interventions: Evaluation of current treatment plan related to bowel elimination and patient's adherence to plan as established by provider Reviewed medications with patient and discussed bowel elimination interventions to include taking her Linzess, beans, warm coffee/tea. Encouraged her to call RN CM if other orders/prescriptions are needed      Receive any available resources for financial strain, food insecurity [facility]) (pt-stated)   On track     Care Coordination Interventions: Provided patient and/or caregiver with local  information about resources for food insecurity, financial strain Advice worker) Discussed plans with patient for ongoing care management follow up and provided patient with direct contact information for care management team Assessed social determinant of health barriers Re assessed for food insecurity, Confirm she has food in the home Confirmed completed collaboration with [facility] care guide         Our next appointment is by telephone on 08/12/22 at 1130  Please call the care guide team at [phone] if you need to cancel or reschedule your appointment.   If you are  experiencing a Mental Health or [place] or need someone to talk to, please call the Suicide and Crisis Lifeline: 988 call the [place] National Suicide Prevention Lifeline: 2483933504 or TTY: 2017517453 TTY 817-600-6323) to talk to a trained counselor call 1-800-273-TALK (toll free, 24 hour hotline) call the [facility]: 5855163024 call 911   Patient verbalizes understanding of instructions and care plan provided today and agrees to view in [place]. Active MyChart status and patient understanding of how to access instructions and care plan via MyChart confirmed with patient.     The patient has been provided with contact information for the care management team and has been advised to call with any health related questions or concerns.   [place] Lavina Hamman, RN, BSN, [place] Coordinator Office number [phone]

## 2022-07-14 NOTE — Patient Instructions (Signed)
Visit Information  Thank you for taking time to visit with me today. Please don't hesitate to contact me if I can be of assistance to you.   Following are the goals we discussed today:   Goals Addressed               This Visit's Progress     COMPLETED: Reduce and Manage Symptoms of Anxiety, Depression and Grief. (pt-stated)   On track     Care Coordination Interventions:  Solution-Focused Strategies Employed. Active Listening/Reflection Utilized.  Emotional Support Provided. Verbalization of Feelings Encouraged.  Cognitive Behavioral Therapy Initiated. Client Centered Therapy Performed. Continue to Encourage Participation in Grief and Loss Support.         Please call the care guide team at [phone] if you need to cancel or reschedule your appointment.   If you are experiencing a Mental Health or [place] or need someone to talk to, please call the Suicide and Crisis Lifeline: 988 call the [place] National Suicide Prevention Lifeline: (586)486-1031 or TTY: (443)293-7274 TTY 254-792-6279) to talk to a trained counselor call 1-800-273-TALK (toll free, 24 hour hotline) go to [facility] [address] [place] [phone]) call the [place]: [phone] call 911  Patient verbalizes understanding of instructions and care plan provided today and agrees to view in [place]. Active MyChart status and patient understanding of how to access instructions and care plan via MyChart confirmed with patient.     No further follow up required.  Nat Christen, BSW, MSW, LCSW  Licensed Education officer, environmental Health System  Mailing [place] [address]. [address], [place], [place] [address] Physical Address-[address], [place], [place] [address] Toll Free Main # [phone] Fax # [phone] Cell # [phone] [email or web address]

## 2022-07-14 NOTE — Patient Outreach (Addendum)
  Care Coordination   Follow Up Visit Note   07/14/2022  Name: Mckayla Mulcahey MRN: [id number] DOB: 08-Jun-1961  Saphronia Ozdemir is a 61 y.o. year old female who sees [name], DO for primary care. I spoke with [name] by phone today.  What matters to the patients health and wellness today?  Reduce and Manage Symptoms of Anxiety, Depression and Grief.    Goals Addressed               This Visit's Progress     Reduce and Manage Symptoms of Anxiety, Depression and Grief. (pt-stated)   On track     Care Coordination Interventions:  Solution-Focused Strategies Employed. Active Listening/Reflection Utilized.  Emotional Support Provided. Verbalization of Feelings Encouraged.  Cognitive Behavioral Therapy Initiated. Client Centered Therapy Performed. Continue to Encourage Participation in Grief and Loss Support.         SDOH assessments and interventions completed:  Yes.  Care Coordination Interventions Activated:  Yes.   Care Coordination Interventions:  Yes, provided.   Follow up plan: No further intervention required.   Encounter Outcome:  Pt. Visit Completed.   Nat Christen, BSW, MSW, LCSW  Licensed Education officer, environmental Health System  Mailing [place] [address], [place], [place] [address] Physical Address-[address], [place], [place] [address] Toll Free Main # [phone] Fax # [phone] Cell # [phone] [email or web address]

## 2022-07-14 NOTE — Patient Outreach (Addendum)
  Care Coordination   Follow Up Visit Note   07/14/2022 Name: Casey Sanders MRN: [id number] DOB: 1961-08-09  Casey Sanders is a 61 y.o. year old female who sees [name], DO for primary care. I spoke with  [name] by phone today.  What matters to the patients health and wellness today?  Stomach pain constipation 3 days She reports she will take RN CM suggestions for interventions and call if other prescriptions may be needed Death in family spoke with [facility] SW today older brother has cancer Has food and awaiting daughter to take her grocery shopping for vegetables Allowed her time to ventilate  By the end of the outreach she reports she felt better and was laughing and sharing memories of family members. She voiced appreciation of the outreach.   Goals Addressed               This Visit's Progress     Patient Stated     manage memory concerns [facility]) (pt-stated)   On track     06/11/22 Care Coordination Interventions: Discussed plans with patient for ongoing care management follow up and provided patient with direct contact information for care management team Screening for signs and symptoms of depression related to chronic disease state  Assessed social determinant of health barriers Encouragement provided allowed her time to ventilate.        Patient will manage bowel elimination at home [facility]) (pt-stated)   Not on track     Care Coordination Interventions: Evaluation of current treatment plan related to bowel elimination and patient's adherence to plan as established by provider Reviewed medications with patient and discussed bowel elimination interventions to include taking her Linzess, beans, warm coffee/tea. Encouraged her to call RN CM if other orders/prescriptions are needed      Receive any available resources for financial strain, food insecurity [facility]) (pt-stated)   On track     Care Coordination Interventions: Provided patient and/or caregiver with local   information about resources for food insecurity, financial strain Advice worker) Discussed plans with patient for ongoing care management follow up and provided patient with direct contact information for care management team Assessed social determinant of health barriers Re assessed for food insecurity, Confirm she has food in the home Confirmed completed collaboration with [facility] care guide         SDOH assessments and interventions completed:  Yes  SDOH Interventions Today    Flowsheet Row Most Recent Value  SDOH Interventions   Housing Interventions Intervention Not Indicated  Transportation Interventions Intervention Not Indicated  Utilities Interventions Intervention Not Indicated  Depression Interventions/Treatment  Referral to Psychiatry, Medication, Counseling  [spoke with [facility] SW today]        Care Coordination Interventions Activated:  Yes  Care Coordination Interventions:  Yes, provided   Follow up plan: Follow up call scheduled for 08/12/22 1130    Encounter Outcome:  Pt. Visit Completed   Hallelujah Wysong L. Lavina Hamman, RN, BSN, [place] Coordinator Office number [phone]

## 2022-07-25 ENCOUNTER — Telehealth: Payer: Self-pay | Admitting: *Deleted

## 2022-07-25 DIAGNOSIS — Z1211 Encounter for screening for malignant neoplasm of colon: Secondary | ICD-10-CM | POA: Diagnosis not present

## 2022-07-25 NOTE — Patient Instructions (Signed)
Visit Information  Thank you for taking time to visit with me today. Please don't hesitate to contact me if I can be of assistance to you.   Following are the goals we discussed today:   Goals Addressed               This Visit's Progress     Patient Stated     manage memory concerns [facility]) (pt-stated)   On track     06/11/22 Care Coordination Interventions: Discussed plans with patient for ongoing care management follow up and provided patient with direct contact information for care management team Encouragement provided with her use of writing down things to remember in her notebook. Also suggested a tape recorder .        Patient will manage bowel elimination at home [facility]) (pt-stated)   On track     Care Coordination Interventions: Discussed plans with patient for ongoing care management follow up and provided patient with direct contact information for care management team Returned a call to the patient to offer help with contacting [employer] for a scheduled pick up of her Cologuard. Commended her for completing herself with the guidance of her grand daughter. Encouragement provided. Reminded her to keep taking her Linzess as ordered.        Our next appointment is by telephone on 08/12/22 at 1130  Please call the care guide team at [phone] if you need to cancel or reschedule your appointment.   If you are experiencing a Mental Health or [place] or need someone to talk to, please call the Suicide and Crisis Lifeline: 988 call the [place] National Suicide Prevention Lifeline: (351)209-5706 or TTY: 438-398-6531 TTY 330-086-1616) to talk to a trained counselor call 1-800-273-TALK (toll free, 24 hour hotline) call the [facility]: [phone] call 911   Patient verbalizes understanding of instructions and care plan provided today and agrees to view in [place]. Active MyChart status and patient understanding of how to access instructions and  care plan via MyChart confirmed with patient.     The patient has been provided with contact information for the care management team and has been advised to call with any health related questions or concerns.   [place] Lavina Hamman, RN, BSN, [place] Coordinator Office number [phone]

## 2022-07-25 NOTE — Patient Outreach (Signed)
  Care Coordination   Follow Up Visit Note   07/25/2022 Name: Casey Sanders MRN: [id number] DOB: Dec 25, 1960  Casey Sanders is a 61 y.o. year old female who sees Casey Norlander, DO for primary care. I spoke with  Casey Sanders by phone today.  What matters to the patients health and wellness today?  Patient had questions about how to have [employer] to schedule a free pick-up of her Cologuard  She was able to complete the process with the assistance of her Casey Sanders.    Goals Addressed               This Visit's Progress     Patient Stated     manage memory concerns [facility]) (pt-stated)   On track     06/11/22 Care Coordination Interventions: Discussed plans with patient for ongoing care management follow up and provided patient with direct contact information for care management team Encouragement provided with her use of writing down things to remember in her notebook. Also suggested a tape recorder .        Patient will manage bowel elimination at home [facility]) (pt-stated)   On track     Care Coordination Interventions: Discussed plans with patient for ongoing care management follow up and provided patient with direct contact information for care management team Returned a call to the patient to offer help with contacting [employer] for a scheduled pick up of her Cologuard. Commended her for completing herself with the guidance of her Casey Sanders. Encouragement provided. Reminded her to keep taking her Linzess as ordered.        SDOH assessments and interventions completed:  Yes  SDOH Interventions Today    Flowsheet Row Most Recent Value  SDOH Interventions   Stress Interventions Intervention Not Indicated  Social Connections Interventions Intervention Not Indicated        Care Coordination Interventions Activated:  Yes  Care Coordination Interventions:  Yes, provided   Follow up plan: Follow up call scheduled for 08/12/22    Encounter Outcome:  Pt. Visit Completed    Casey Sanders L. Lavina Hamman, RN, BSN, [place] Coordinator Office number [phone]

## 2022-08-01 LAB — COLOGUARD: COLOGUARD: NEGATIVE

## 2022-08-04 ENCOUNTER — Telehealth: Payer: Self-pay | Admitting: Family Medicine

## 2022-08-04 NOTE — Telephone Encounter (Signed)
Pt aware.

## 2022-08-12 ENCOUNTER — Ambulatory Visit: Payer: Self-pay | Admitting: *Deleted

## 2022-08-12 ENCOUNTER — Encounter: Payer: Self-pay | Admitting: *Deleted

## 2022-08-12 NOTE — Patient Outreach (Signed)
  Care Coordination   Follow Up Visit Note   08/12/2022 Name: Casey Sanders MRN: [id number] DOB: [birth year]  Casey Sanders is a 61 y.o. year old female who sees Casey Norlander, DO for primary care. I spoke with  Casey Sanders by phone today.  What matters to the patients health and wellness today?  Has bilateral earache, nasal stuffiness, sore throat, headache, some back pain [place]  . Has taken flu/cold over the counter (OTC) medicine Medicine her grandson purchased since Saturday 08/09/22 Call with pt to pcp for virtual  Encourage ns spray and covid test  Possible cold air and blowing allergies     Goals Addressed               This Visit's Progress     Patient Stated     Improve respiratory symptoms ([facility]) (pt-stated)   Not on track     Care Coordination Interventions: Advised patient to self assesses Asthma action plan zone and make appointment with provider if in the yellow zone for 48 hours without improvement Discussed the importance of adequate rest and management of fatigue with Asthma Outreached with patient to her pcp office Coordinated a virtual visit with staff        SDOH assessments and interventions completed:  No   .pmh  Care Coordination Interventions Activated:  Yes  Care Coordination Interventions:  Yes, provided   Follow up plan: Follow up call scheduled for 09/09/22    Encounter Outcome:  Pt. Visit Completed   Casey Sanders L. Lavina Hamman, RN, BSN, [place] Coordinator Office number [phone]

## 2022-08-13 ENCOUNTER — Encounter: Payer: Self-pay | Admitting: Family Medicine

## 2022-08-13 ENCOUNTER — Ambulatory Visit (INDEPENDENT_AMBULATORY_CARE_PROVIDER_SITE_OTHER): Payer: Medicare Other | Admitting: Family Medicine

## 2022-08-13 DIAGNOSIS — R062 Wheezing: Secondary | ICD-10-CM | POA: Diagnosis not present

## 2022-08-13 DIAGNOSIS — J069 Acute upper respiratory infection, unspecified: Secondary | ICD-10-CM

## 2022-08-13 MED ORDER — PREDNISONE 20 MG PO TABS: 40.0000 mg | ORAL_TABLET | Freq: Every day | ORAL | 0 refills | Status: AC

## 2022-08-13 MED ORDER — ALBUTEROL SULFATE HFA 108 (90 BASE) MCG/ACT IN AERS: 2.0000 | INHALATION_SPRAY | Freq: Four times a day (QID) | RESPIRATORY_TRACT | 2 refills | Status: DC | PRN

## 2022-08-13 NOTE — Progress Notes (Signed)
Virtual Visit  Note Due to COVID-19 pandemic this visit was conducted virtually. This visit type was conducted due to national recommendations for restrictions regarding the COVID-19 Pandemic (e.g. social distancing, sheltering in place) in an effort to limit this patient's exposure and mitigate transmission in our community. All issues noted in this document were discussed and addressed.  A physical exam was not performed with this format.  I connected with Casey Sanders on 08/13/22 at 0917 by telephone and verified that I am speaking with the correct person using two identifiers. Casey Sanders is currently located at home and no one is currently with her during the visit. The provider, Gwenlyn Perking, FNP is located in their office at time of visit.  I discussed the limitations, risks, security and privacy concerns of performing an evaluation and management service by telephone and the availability of in person appointments. I also discussed with the patient that there may be a patient responsible charge related to this service. The patient expressed understanding and agreed to proceed.  CC: URI  History and Present Illness:  URI  This is a new problem. Episode onset: 3 days ago. The problem has been unchanged. The maximum temperature recorded prior to her arrival was 101 - 101.9 F. Associated symptoms include congestion, coughing, ear pain (right), headaches, rhinorrhea, sinus pain, a sore throat and wheezing. Pertinent negatives include no abdominal pain, chest pain, diarrhea, dysuria, joint pain, joint swelling, nausea, neck pain or vomiting. She has tried decongestant, NSAIDs and acetaminophen for the symptoms. The treatment provided mild relief.  She has had a negative home Covid test. She denies history of COPD or asthma.    Review of Systems  HENT:  Positive for congestion, ear pain (right), rhinorrhea, sinus pain and sore throat.   Respiratory:  Positive for cough and wheezing.    Cardiovascular:  Negative for chest pain.  Gastrointestinal:  Negative for abdominal pain, diarrhea, nausea and vomiting.  Genitourinary:  Negative for dysuria.  Musculoskeletal:  Negative for joint pain and neck pain.  Neurological:  Positive for headaches.     Observations/Objective: Alert and oriented x 3. Able to speak in full sentences without difficulty.   Assessment and Plan: Casey Sanders was seen today for uri.  Diagnoses and all orders for this visit:  Viral URI with cough Wheezing Negative home Covid tests. Prednisone burst and albuterol prn for wheezing. Discussed symptomatic care and return precautions.  -     predniSONE (DELTASONE) 20 MG tablet; Take 2 tablets (40 mg total) by mouth daily with breakfast for 3 days. -     albuterol (VENTOLIN HFA) 108 (90 Base) MCG/ACT inhaler; Inhale 2 puffs into the lungs every 6 (six) hours as needed for wheezing or shortness of breath.     Follow Up Instructions: As needed.     I discussed the assessment and treatment plan with the patient. The patient was provided an opportunity to ask questions and all were answered. The patient agreed with the plan and demonstrated an understanding of the instructions.   The patient was advised to call back or seek an in-person evaluation if the symptoms worsen or if the condition fails to improve as anticipated.  The above assessment and management plan was discussed with the patient. The patient verbalized understanding of and has agreed to the management plan. Patient is aware to call the clinic if symptoms persist or worsen. Patient is aware when to return to the clinic for a follow-up visit. Patient educated on  when it is appropriate to go to the emergency department.   Time call ended:  0928  I provided 11 minutes of  non face-to-face time during this encounter.    Gwenlyn Perking, FNP

## 2022-09-04 NOTE — Patient Instructions (Signed)
Visit Information  Thank you for taking time to visit with me today. Please don't hesitate to contact me if I can be of assistance to you.   Following are the goals we discussed today:   Goals Addressed               This Visit's Progress     Patient Stated     Improve respiratory symptoms [facility]) (pt-stated)   Not on track     Care Coordination Interventions: Advised patient to self assesses Asthma action plan zone and make appointment with provider if in the yellow zone for 48 hours without improvement Discussed the importance of adequate rest and management of fatigue with Asthma Outreached with patient to her pcp office Coordinated a virtual visit with staff        Our next appointment is by telephone on 09/09/22 at 0930  Please call the care guide team at [phone] if you need to cancel or reschedule your appointment.   If you are experiencing a Mental Health or [place] or need someone to talk to, please call the Suicide and Crisis Lifeline: 988 call the [place] National Suicide Prevention Lifeline: 425-600-7989 or TTY: 574-081-7645 TTY 712 180 1458) to talk to a trained counselor call 1-800-273-TALK (toll free, 24 hour hotline) call the [facility]: [phone] call 911   Patient verbalizes understanding of instructions and care plan provided today and agrees to view in [place]. Active MyChart status and patient understanding of how to access instructions and care plan via MyChart confirmed with patient.     The patient has been provided with contact information for the care management team and has been advised to call with any health related questions or concerns.   Tomy Khim L. Lavina Hamman, RN, BSN, [place] Coordinator Office number [phone]

## 2022-09-09 ENCOUNTER — Ambulatory Visit: Payer: Self-pay | Admitting: *Deleted

## 2022-09-09 NOTE — Patient Outreach (Signed)
  Care Coordination   09/09/2022 Name: Casey Sanders MRN: [id number] DOB: 1961/06/05   Care Coordination Outreach Attempts:  An unsuccessful telephone outreach was attempted today to offer the patient information about available care coordination services as a benefit of their health plan.   Follow Up Plan:  Additional outreach attempts will be made to offer the patient care coordination information and services.   Encounter Outcome:  No Answer  Care Coordination Interventions Activated:  No   Care Coordination Interventions:  No, not indicated     Latravia [place] L. Lavina Hamman, RN, BSN, [place] Coordinator Office number [phone]

## 2022-09-19 ENCOUNTER — Ambulatory Visit: Payer: Self-pay | Admitting: *Deleted

## 2022-09-19 NOTE — Patient Instructions (Addendum)
Visit Information  Thank you for taking time to visit with me today. Please don't hesitate to contact me if I can be of assistance to you.   Following are the goals we discussed today:   Goals Addressed               This Visit's Progress     Patient Stated     Improve respiratory/throat symptoms [facility]) (pt-stated)   On track     Care Coordination Interventions: Advised patient to self assesses Asthma action plan zone and make appointment with provider if in the yellow zone for 48 hours without improvement Discussed the importance of adequate rest and management of fatigue with Asthma Assessed for improvements of Upper respiratory infection Confirmed she continues to have a sore throat that is related to GI symptoms Spoke with [name] at [place] [phone] who confirmed available appointments are with the PA on12/20/23 & Dr [name] (Wednesday November 12 998)  Conference with the patient to confirm her preference of the 11/12/22 10 am appointment with Dr [name] vs the PA Confirmed she will need assistance with a ride from [facility] (her new coverage for 2024)      Manage Diabetes ([place]) (pt-stated)   On track     Care Coordination Interventions: Provided education to patient about basic DM disease process Discussed plans with patient for ongoing care management follow up and provided patient with direct contact information for care management team Confirmed management of diabetes varies Confirmed she is still using her Accu chek (not a continuous blood glucose monitor)       Patient will manage bowel elimination at home [facility]) (pt-stated)        Care Coordination Interventions: Discussed plans with patient for ongoing care management follow up and provided patient with direct contact information for care management team Spoke with [name] at [place] [phone] who confirmed available appointments are with the PA on12/20/23 & Dr [name] (Wednesday November 12 998)   Conference with the patient to confirm her preference of the 11/12/22 10 am appointment with Dr [name] vs the PA Confirmed she will need assistance with a ride from [facility] (her new coverage for 2024)        Our next appointment is by telephone on 10/28/22 at 1000  Please call the care guide team at [phone] if you need to cancel or reschedule your appointment.   If you are experiencing a Mental Health or [place] or need someone to talk to, please call the Suicide and Crisis Lifeline: 988 call the [place] National Suicide Prevention Lifeline: (629)468-8415 or TTY: 630-841-9610 TTY 623 242 3832) to talk to a trained counselor call 1-800-273-TALK (toll free, 24 hour hotline) call the [facility]: 425-153-3558 call 911   Patient verbalizes understanding of instructions and care plan provided today and agrees to view in [place]. Active MyChart status and patient understanding of how to access instructions and care plan via MyChart confirmed with patient.     The patient has been provided with contact information for the care management team and has been advised to call with any health related questions or concerns.   Lucile Didonato L. Lavina Hamman, RN, BSN, [place] Coordinator Office number [phone]

## 2022-09-19 NOTE — Patient Outreach (Signed)
  Care Coordination   Follow Up Visit Note   09/19/2022 Name: Casey Sanders MRN: [id number] DOB: 1960-11-07  Casey Sanders is a 61 y.o. year old female who sees Casey Norlander, DO for primary care. I spoke with  Casey Sanders by phone today.  What matters to the patients health and wellness today?  Has a sore throat  Was not able to go to [facility] as she had an URI and now she has a long wait for an appointment  She called GI last week & was informed she being placed on the waiting list  She reports her preference is to have her appointments on Thursdays  or Fridays so family can assist with transport Denies heartburn She has gargled with warm water, used throat sprays, sipped warm liquids,  She has not tried but will the following:  Suck on hard candy or throat lozenges. Put a cool-mist humidifier in your bedroom at night. Sit in the bathroom with the door closed for 5-10 minutes while you run hot water in the shower.    Seen 08/13/22 visit taking inhaler No wheezing    Goals Addressed               This Visit's Progress     Patient Stated     Improve respiratory/throat symptoms ([facility]) (pt-stated)   On track     Care Coordination Interventions: Advised patient to self assesses Asthma action plan zone and make appointment with provider if in the yellow zone for 48 hours without improvement Discussed the importance of adequate rest and management of fatigue with Asthma Assessed for improvements of Upper respiratory infection Confirmed she continues to have a sore throat that is related to GI symptoms Spoke with [name] at [place] [phone] who confirmed available appointments are with the PA on12/20/23 & Dr [name] (Wednesday November 12 998)  Conference with the patient to confirm her preference of the 11/12/22 10 am appointment with Dr [name] vs the PA Confirmed she will need assistance with a ride from [facility] (her new coverage for 2024)      Manage Diabetes  ([place]) (pt-stated)   On track     Care Coordination Interventions: Provided education to patient about basic DM disease process Discussed plans with patient for ongoing care management follow up and provided patient with direct contact information for care management team Confirmed management of diabetes varies Confirmed she is still using her Accu chek (not a continuous blood glucose monitor)       Patient will manage bowel elimination at home [facility]) (pt-stated)        Care Coordination Interventions: Discussed plans with patient for ongoing care management follow up and provided patient with direct contact information for care management team Spoke with [name] at [place] [phone] who confirmed available appointments are with the PA on12/20/23 & Dr [name] (Wednesday November 12 998)  Conference with the patient to confirm her preference of the 11/12/22 10 am appointment with Dr [name] vs the PA Confirmed she will need assistance with a ride from [facility] (her new coverage for 2024)        SDOH assessments and interventions completed:  No     Care Coordination Interventions:  Yes, provided   Follow up plan: Follow up call scheduled for 10/28/22    Encounter Outcome:  Pt. Visit Completed   Casey Sanders L. Lavina Hamman, RN, BSN, [place] Coordinator Office number [phone]

## 2022-09-22 ENCOUNTER — Other Ambulatory Visit: Payer: Self-pay | Admitting: Family Medicine

## 2022-09-22 DIAGNOSIS — Z1231 Encounter for screening mammogram for malignant neoplasm of breast: Secondary | ICD-10-CM

## 2022-09-24 ENCOUNTER — Ambulatory Visit
Admission: RE | Admit: 2022-09-24 | Discharge: 2022-09-24 | Disposition: A | Discharge status: Home / Self Care | Payer: Medicare Other | Source: Ambulatory Visit | Attending: Family Medicine | Admitting: Family Medicine

## 2022-09-24 DIAGNOSIS — Z1231 Encounter for screening mammogram for malignant neoplasm of breast: Secondary | ICD-10-CM

## 2022-09-26 ENCOUNTER — Ambulatory Visit: Payer: Medicare Other | Admitting: Cardiology

## 2022-10-27 ENCOUNTER — Other Ambulatory Visit: Payer: Self-pay | Admitting: Family Medicine

## 2022-10-27 ENCOUNTER — Ambulatory Visit (INDEPENDENT_AMBULATORY_CARE_PROVIDER_SITE_OTHER): Payer: Medicare HMO

## 2022-10-27 VITALS — Ht 61.0 in | Wt 150.0 lb

## 2022-10-27 DIAGNOSIS — Z0000 Encounter for general adult medical examination without abnormal findings: Secondary | ICD-10-CM

## 2022-10-27 DIAGNOSIS — R062 Wheezing: Secondary | ICD-10-CM

## 2022-10-27 NOTE — Progress Notes (Signed)
Subjective:   Casey Sanders is a 62 y.o. female who presents for Medicare Annual (Subsequent) preventive examination.  I connected with  [name] on 10/27/22 by a audio enabled telemedicine application and verified that I am speaking with the correct person using two identifiers.  Patient Location: Home  Provider Location: Office/Clinic  I discussed the limitations of evaluation and management by telemedicine. The patient expressed understanding and agreed to proceed.  Review of Systems     Cardiac Risk Factors include: diabetes mellitus;dyslipidemia;hypertension;sedentary lifestyle     Objective:    Today's Vitals   10/27/22 1100  Weight: 150 lb (68 kg)  Height: '5\' 1"'$  (1.549 m)   Body mass index is 28.34 kg/m.     10/27/2022   11:04 AM 06/11/2022    1:59 PM 06/09/2022    3:06 PM 10/23/2021   10:00 AM 08/10/2017    1:25 AM 08/09/2017    6:23 PM 07/07/2013    8:23 PM  Advanced Directives  Does Patient Have a Medical Advance Directive? No Yes Yes No No No Patient does not have advance directive;Patient would not like information  Type of Pension scheme manager Power of [place];Living will [place];Living will      Does patient want to make changes to medical advance directive?  No - Guardian declined No - Patient declined      Copy of [place] in Chart?  No - copy requested No - copy requested      Would patient like information on creating a medical advance directive? No - Patient declined   No - Patient declined No - Patient declined No - Patient declined   Pre-existing out of facility DNR order (yellow form or pink MOST form)       No    Current Medications (verified) Outpatient Encounter Medications as of 10/27/2022  Medication Sig   Accu-Chek Softclix Lancets lancets Please use to test blood sugar 3 times daily as directed. DX: E11.9   albuterol (VENTOLIN HFA) 108 (90 Base) MCG/ACT inhaler Inhale 2 puffs into the lungs  every 6 (six) hours as needed for wheezing or shortness of breath.   aspirin 81 MG EC tablet Take 81 mg by mouth daily.   atorvastatin (LIPITOR) 80 MG tablet Take 1 tablet (80 mg total) by mouth daily at 6 PM.   ciclopirox (PENLAC) 8 % solution Apply topically at bedtime. Apply over nail and surrounding skin. Apply daily over previous coat. After seven (7) days, may remove with alcohol and continue cycle.   diclofenac sodium (VOLTAREN) 1 % GEL Apply 2 g topically 4 (four) times daily.   fish oil-omega-3 fatty acids 1000 MG capsule Take 2 g by mouth daily.   linaclotide (LINZESS) 145 MCG CAPS capsule 1 capsule at least 30 minutes before the first meal of the day on an empty stomach   lisinopril (ZESTRIL) 20 MG tablet Take 1 tablet (20 mg total) by mouth daily.   mirtazapine (REMERON) 7.5 MG tablet Take 1 tablet (7.5 mg total) by mouth at bedtime.   Blood Glucose Monitoring Suppl (ACCU-CHEK GUIDE ME) w/Device KIT Please use to test blood sugar 3 times daily as directed. DX: E11.9 (Patient not taking: Reported on 10/27/2022)   glucose blood (ACCU-CHEK GUIDE) test strip Please use to test blood sugar 3 times daily as directed. DX: E11.9 (Patient not taking: Reported on 10/27/2022)   No facility-administered encounter medications on file as of 10/27/2022.    Allergies (verified)  Codeine, Empagliflozin, Metformin and related, and Niaspan [niacin er]   History: Past Medical History:  Diagnosis Date   Allergic rhinitis    Anxiety and depression    Asthma    Benign essential HTN 01/04/2011   Chest pain    a. Myoview 9/05: no ischemia, no scar, EF 70%   Chest pain, unspecified 02/12/2011   Cystitis 02/08/2015   Diverticulosis    DM2 (diabetes mellitus, type 2) ([place])    Extremity pain 01/04/2013   Fibromyalgia    Frequent UTI    GERD (gastroesophageal reflux disease)    Hiatal Hernia   HLD (hyperlipidemia)    HTN (hypertension)    Left rotator cuff tear    Low back pain 01/04/2013   Lumbar disc  disease    Nephrolithiasis    Obesity    Palpitations    monitor 9/05: NSR, sinus tachy, PVCs   Pre-operative cardiovascular examination 02/12/2011   Rectal bleeding 08/22/2013   Sepsis secondary to UTI ([place]) 08/09/2017   uterine ca dx'd 1988   per pt   Past Surgical History:  Procedure Laterality Date   ABDOMINAL HYSTERECTOMY     CESAREAN SECTION     ESOPHAGEAL MANOMETRY N/A 06/19/2021   Procedure: ESOPHAGEAL MANOMETRY (EM);  Surgeon: Wilford Corner, MD;  Location: [facility];  Service: Endoscopy;  Laterality: N/A;   FOOT SURGERY     right wrist surgery     ROTATOR CUFF REPAIR Left 01/01/08   tonsillectomy     TOTAL ABDOMINAL HYSTERECTOMY W/ BILATERAL SALPINGOOPHORECTOMY     Family History  Problem Relation Age of Onset   Coronary artery disease Mother    Coronary artery disease Father    Heart disease Brother    Heart disease Brother    Lung cancer Brother 37   Heart attack Brother 76       had a few heart attacks before   Heart disease Brother    Breast cancer Neg Hx    Social History   Socioeconomic History   Marital status: Widowed    Spouse name: Not on file   Number of children: 2   Years of education: 49   Highest education level: 12th grade  Occupational History   Occupation: disabled  Tobacco Use   Smoking status: Never    Passive exposure: Never   Smokeless tobacco: Never  Vaping Use   Vaping Use: Never used  Substance and Sexual Activity   Alcohol use: No   Drug use: No   Sexual activity: Not Currently    Partners: Male  Other Topics Concern   Not on file  Social History Narrative   2 daughters and 4 grandchildren.   Husband died in 12-31-17.   Social Determinants of Health   Financial Resource Strain: Low Risk  (10/27/2022)   Overall Financial Resource Strain (CARDIA)    Difficulty of Paying Living Expenses: Not very hard  Food Insecurity: No Food Insecurity (10/27/2022)   Hunger Vital Sign    Worried About Running Out of Food in the Last Year:  Never true    Ran Out of Food in the Last Year: Never true  Transportation Needs: No Transportation Needs (10/27/2022)   PRAPARE - Hydrologist (Medical): No    Lack of Transportation (Non-Medical): No  Physical Activity: Inactive (10/27/2022)   Exercise Vital Sign    Days of Exercise per Week: 0 days    Minutes of Exercise per Session: 0 min  Stress: No  Stress Concern Present (10/27/2022)   [place]    Feeling of Stress : Only a little  Social Connections: Moderately Integrated (10/27/2022)   Social Connection and Isolation Panel [NHANES]    Frequency of Communication with Friends and Family: More than three times a week    Frequency of Social Gatherings with Friends and Family: Three times a week    Attends Religious Services: More than 4 times per year    Active Member of Clubs or Organizations: Yes    Attends Archivist Meetings: More than 4 times per year    Marital Status: Widowed    Tobacco Counseling Counseling given: Not Answered   Clinical Intake:  Pre-visit preparation completed: Yes  Pain : No/denies pain     Diabetes: Yes CBG done?: No Did pt. bring in CBG monitor from home?: No  How often do you need to have someone help you when you read instructions, pamphlets, or other written materials from your doctor or pharmacy?: 1 - Never  Diabetic?Yes Nutrition Risk Assessment:  Has the patient had any N/V/D within the last 2 months?  No  Does the patient have any non-healing wounds?  No  Has the patient had any unintentional weight loss or weight gain?  Yes due to respiratory illness and poor appetite   Diabetes:  Is the patient diabetic?  Yes  If diabetic, was a CBG obtained today?  No  Did the patient bring in their glucometer from home?  No  How often do you monitor your CBG's? Is not currently monitoring because she states that she has too much pain with  sticking fingers .   Financial Strains and Diabetes Management:  Are you having any financial strains with the device, your supplies or your medication? No .  Does the patient want to be seen by Chronic Care Management for management of their diabetes?  No  Would the patient like to be referred to a Nutritionist or for Diabetic Management?  No   Diabetic Exams:  Diabetic Eye Exam: Overdue for diabetic eye exam. Pt has been advised about the importance in completing this exam. Patient advised to call and schedule an eye exam. Diabetic Foot Exam: Completed 07/07/22   Interpreter Needed?: No  Information entered by :: Denman George LPN   Activities of Daily Living    10/27/2022   11:05 AM 06/09/2022    3:05 PM  In your present state of health, do you have any difficulty performing the following activities:  Hearing? 0 0  Vision? 0 0  Difficulty concentrating or making decisions? 0 0  Walking or climbing stairs? 0 0  Dressing or bathing? 0 0  Doing errands, shopping? 0 0  Preparing Food and eating ? N N  Using the Toilet? N N  In the past six months, have you accidently leaked urine? N N  Do you have problems with loss of bowel control? N N  Managing your Medications? N N  Managing your Finances? N N  Housekeeping or managing your Housekeeping? N N    Patient Care Team: [name], DO as PCP - General (Family Medicine) Lavera Guise, [facility] (Pharmacist) Barbaraann Faster, RN as [place] Brace, MD as Consulting Physician (Gastroenterology)  Indicate any recent Medical Services you may have received from other than [facility] providers in the past year (date may be approximate).     Assessment:   This is  a routine wellness examination for [facility].  Hearing/Vision screen Hearing Screening - Comments:: Denies hearing difficulties   Vision Screening - Comments:: Wears rx glasses - patient will schedule diabetic eye exam with  [facility] De[facility] Coder     Dietary issues and exercise activities discussed: Current Exercise Habits: The patient does not participate in regular exercise at present   Goals Addressed   None   Depression Screen    10/27/2022   11:06 AM 07/14/2022   11:53 AM 07/07/2022    3:35 PM 06/11/2022    2:04 PM 06/09/2022    2:59 PM 05/19/2022    2:19 PM 04/08/2022    3:13 PM  PHQ 2/9 Scores  PHQ - 2 Score 0 '2 2 2 2 2 2  '$ PHQ- 9 Score  '5 5 6 6 8 7    '$ Fall Risk    10/27/2022   11:04 AM 07/07/2022    3:35 PM 06/11/2022    1:59 PM 06/09/2022    3:05 PM 03/13/2022   10:43 AM  Fall Risk   Falls in the past year? 0 0 0 0 0  Number falls in past yr: 0  0 0   Injury with Fall? 0  0 0   Risk for fall due to : No Fall Risks  No Fall Risks No Fall Risks   Follow up Falls evaluation completed;Education provided;Falls prevention discussed  Falls evaluation completed Falls evaluation completed;Education provided;Falls prevention discussed     FALL RISK PREVENTION PERTAINING TO THE HOME:  Any stairs in or around the home? No  If so, are there any without handrails? No  Home free of loose throw rugs in walkways, pet beds, electrical cords, etc? Yes  Adequate lighting in your home to reduce risk of falls? Yes   ASSISTIVE DEVICES UTILIZED TO PREVENT FALLS:  Life alert? No  Use of a cane, walker or w/c? No  Grab bars in the bathroom? Yes  Shower chair or bench in shower? No  Elevated toilet seat or a handicapped toilet? Yes   TIMED UP AND GO:  Was the test performed? No . Telephonic visit   Cognitive Function:        10/27/2022   11:05 AM 10/23/2021   10:05 AM  6CIT Screen  What Year? 0 points 0 points  What month? 0 points 0 points  What time? 0 points 0 points  Count back from 20 0 points 0 points  Months in reverse 0 points 0 points  Repeat phrase 0 points 4 points  Total Score 0 points 4 points    Immunizations Immunization History  Administered Date(s) Administered   Influenza Whole  10/20/2006   Influenza,inj,Quad PF,6+ Mos 08/22/2013, 09/07/2014, 07/15/2016, 08/20/2017, 07/05/2019, 07/07/2022   Moderna Sars-Covid-2 Vaccination 03/28/2020, 04/26/2020   Pneumococcal Conjugate-13 09/07/2014   Pneumococcal Polysaccharide-23 07/15/2016   Td 10/20/2002   Td (Adult), 2 Lf Tetanus Toxid, Preservative Free 10/20/2002    TDAP status: Due, Education has been provided regarding the importance of this vaccine. Advised may receive this vaccine at local pharmacy or Health Dept. Aware to provide a copy of the vaccination record if obtained from local pharmacy or Health Dept. Verbalized acceptance and understanding.  Flu Vaccine status: Up to date  Pneumococcal vaccine status: Up to date  Covid-19 vaccine status: Information provided on how to obtain vaccines.   Qualifies for Shingles Vaccine? Yes   Zostavax completed No   Shingrix Completed?: No.    Education has been provided regarding the importance  of this vaccine. Patient has been advised to call insurance company to determine out of pocket expense if they have not yet received this vaccine. Advised may also receive vaccine at local pharmacy or Health Dept. Verbalized acceptance and understanding.  Screening Tests Health Maintenance  Topic Date Due   DTaP/Tdap/Td (3 - Tdap) 10/20/2012   OPHTHALMOLOGY EXAM  10/25/2020   COVID-19 Vaccine (3 - Moderna risk series) 04/19/2023 (Originally 05/24/2020)   Zoster Vaccines- Shingrix (1 of 2) 04/19/2023 (Originally 03/10/1980)   HEMOGLOBIN A1C  01/05/2023   Diabetic kidney evaluation - eGFR measurement  04/11/2023   Diabetic kidney evaluation - Urine ACR  07/08/2023   FOOT EXAM  07/08/2023   Medicare Annual Wellness (AWV)  10/28/2023   MAMMOGRAM  09/24/2024   Fecal DNA (Cologuard)  07/25/2025   INFLUENZA VACCINE  Completed   Hepatitis C Screening  Completed   HIV Screening  Completed   HPV VACCINES  Aged Out   PAP SMEAR-Modifier  Discontinued   COLONOSCOPY (Pts 45-50yr Insurance  coverage will need to be confirmed)  Discontinued    Health Maintenance  Health Maintenance Due  Topic Date Due   DTaP/Tdap/Td (3 - Tdap) 10/20/2012   OPHTHALMOLOGY EXAM  10/25/2020    Colorectal cancer screening: Type of screening: Cologuard. Completed 07/25/22. Repeat every 3 years  Mammogram status: Completed 09/24/22. Repeat every year  Lung Cancer Screening: (Low Dose CT Chest recommended if Age 64-80years, 30 pack-year currently smoking OR have quit w/in 15years.) does not qualify.   Lung Cancer Screening Referral: n/a   Additional Screening:  Hepatitis C Screening: does qualify; Completed 05/23/16  Vision Screening: Recommended annual ophthalmology exams for early detection of glaucoma and other disorders of the eye. Is the patient up to date with their annual eye exam?  No  Who is the provider or what is the name of the office in which the patient attends annual eye exams? [facility] MDebe Coder If pt is not established with a provider, would they like to be referred to a provider to establish care? No .   Dental Screening: Recommended annual dental exams for proper oral hygiene  Community Resource Referral / Chronic Care Management: CRR required this visit?  No   CCM required this visit?  No      Plan:     I have personally reviewed and noted the following in the patient's chart:   Medical and social history Use of alcohol, tobacco or illicit drugs  Current medications and supplements including opioid prescriptions. Patient is not currently taking opioid prescriptions. Functional ability and status Nutritional status Physical activity Advanced directives List of other physicians Hospitalizations, surgeries, and ER visits in previous 12 months Vitals Screenings to include cognitive, depression, and falls Referrals and appointments  In addition, I have reviewed and discussed with patient certain preventive protocols, quality metrics, and best practice  recommendations. A written personalized care plan for preventive services as well as general preventive health recommendations were provided to patient.     SDenman GeorgeBAvondale LWy[place]  15/06/7415  Nurse Notes: Patient complains of respiratory symptoms with onset around Christmas.  Continues with cough, congestion, and fatigue.  Also states that she does not check blood sugar due to pain with pricking finger, noted that she is a diet controlled diabetic.

## 2022-10-27 NOTE — Patient Instructions (Signed)
Casey Sanders , Thank you for taking time to come for your Medicare Wellness Visit. I appreciate your ongoing commitment to your health goals. Please review the following plan we discussed and let me know if I can assist you in the future.   These are the goals we discussed:  Goals       Exercise 3x per week (30 min per time)      Try to exercise as tolerated, try chair exercises.      Improve respiratory/throat symptoms (THN) (pt-stated)      Care Coordination Interventions: Advised patient to self assesses Asthma action plan zone and make appointment with provider if in the yellow zone for 48 hours without improvement Discussed the importance of adequate rest and management of fatigue with Asthma Assessed for improvements of Upper respiratory infection Confirmed she continues to have a sore throat that is related to GI symptoms Spoke with [name] at [place] [phone] who confirmed available appointments are with the PA on12/20/23 & Dr [name] (Wednesday November 12 998)  Conference with the patient to confirm her preference of the 11/12/22 10 am appointment with Dr [name] vs the PA Confirmed she will need assistance with a ride from [facility] (her new coverage for 2024)      Manage Diabetes ([place]) (pt-stated)      Care Coordination Interventions: Provided education to patient about basic DM disease process Discussed plans with patient for ongoing care management follow up and provided patient with direct contact information for care management team Confirmed management of diabetes varies Confirmed she is still using her Accu chek (not a continuous blood glucose monitor)       Manage hypertension (THN ) (pt-stated)      Care Coordination Interventions: Evaluation of current treatment plan related to hypertension self management and patient's adherence to plan as established by provider Discussed plans with patient for ongoing care management follow up and provided patient with direct  contact information for care management team Advised patient, providing education and rationale, to monitor blood pressure daily and record, calling PCP for findings outside established parameters Screening for signs and symptoms of depression related to chronic disease state  Assessed social determinant of health barriers Discussed possible assistance to obtain equipment to monitor blood pressure at home Sent online my chart education on how to take your blood pressure  Outreach with patient to [employer] customer service, [name] to place an order for a blood pressure cuff       manage memory concerns [facility]) (pt-stated)      06/11/22 Care Coordination Interventions: Discussed plans with patient for ongoing care management follow up and provided patient with direct contact information for care management team Encouragement provided with her use of writing down things to remember in her notebook. Also suggested a tape recorder .        Patient will manage bowel elimination at home [facility]) (pt-stated)      Care Coordination Interventions: Discussed plans with patient for ongoing care management follow up and provided patient with direct contact information for care management team Spoke with [name] at [place] [phone] who confirmed available appointments are with the PA on12/20/23 & Dr [name] (Wednesday November 12 998)  Conference with the patient to confirm her preference of the 11/12/22 10 am appointment with Dr [name] vs the PA Confirmed she will need assistance with a ride from [facility] (her new coverage for 2024)      Receive any available resources  for financial strain, food insecurity [facility]) (pt-stated)      Care Coordination Interventions: Provided patient and/or caregiver with local  information about resources for food insecurity, financial strain Advice worker) Discussed plans with patient for ongoing care management follow up and provided patient with direct  contact information for care management team Assessed social determinant of health barriers Re assessed for food insecurity, Confirm she has food in the home Confirmed completed collaboration with [facility] care guide         This is a list of the screening recommended for you and due dates:  Health Maintenance  Topic Date Due   DTaP/Tdap/Td vaccine (3 - Tdap) 10/20/2012   Eye exam for diabetics  10/25/2020   COVID-19 Vaccine (3 - Moderna risk series) 04/19/2023*   Zoster (Shingles) Vaccine (1 of 2) 04/19/2023*   Hemoglobin A1C  01/05/2023   Yearly kidney function blood test for diabetes  04/11/2023   Yearly kidney health urinalysis for diabetes  07/08/2023   Complete foot exam   07/08/2023   Medicare Annual Wellness Visit  10/28/2023   Mammogram  09/24/2024   Cologuard (Stool DNA test)  07/25/2025   Flu Shot  Completed   Hepatitis C Screening: USPSTF Recommendation to screen - Ages 18-79 yo.  Completed   HIV Screening  Completed   HPV Vaccine  Aged Out   Pap Smear  Discontinued   Colon Cancer Screening  Discontinued  *Topic was postponed. The date shown is not the original due date.    Advanced directives: Forms are available if you choose in the future to pursue completion.  This is recommended in order to make sure that your health wishes are honored in the event that you are unable to verbalize them to the provider.    Conditions/risks identified: Aim for 30 minutes of exercise or brisk walking, 6-8 glasses of water, and 5 servings of fruits and vegetables each day.   Next appointment: Follow up in one year for your annual wellness visit.   Preventive Care 40-64 Years, Female Preventive care refers to lifestyle choices and visits with your health care provider that can promote health and wellness. What does preventive care include? A yearly physical exam. This is also called an annual well check. Dental exams once or twice a year. Routine eye exams. Ask your health care  provider how often you should have your eyes checked. Personal lifestyle choices, including: Daily care of your teeth and gums. Regular physical activity. Eating a healthy diet. Avoiding tobacco and drug use. Limiting alcohol use. Practicing safe sex. Taking low-dose aspirin daily starting at age 33. Taking vitamin and mineral supplements as recommended by your health care provider. What happens during an annual well check? The services and screenings done by your health care provider during your annual well check will depend on your age, overall health, lifestyle risk factors, and family history of disease. Counseling  Your health care provider may ask you questions about your: Alcohol use. Tobacco use. Drug use. Emotional well-being. Home and relationship well-being. Sexual activity. Eating habits. Work and work Statistician. Method of birth control. Menstrual cycle. Pregnancy history. Screening  You may have the following tests or measurements: Height, weight, and BMI. Blood pressure. Lipid and cholesterol levels. These may be checked every 5 years, or more frequently if you are over 85 years old. Skin check. Lung cancer screening. You may have this screening every year starting at age 49 if you have a 30-pack-year history of smoking and currently  smoke or have quit within the past 15 years. Fecal occult blood test (FOBT) of the stool. You may have this test every year starting at age 86. Flexible sigmoidoscopy or colonoscopy. You may have a sigmoidoscopy every 5 years or a colonoscopy every 10 years starting at age 7. Hepatitis C blood test. Hepatitis B blood test. Sexually transmitted disease (STD) testing. Diabetes screening. This is done by checking your blood sugar (glucose) after you have not eaten for a while (fasting). You may have this done every 1-3 years. Mammogram. This may be done every 1-2 years. Talk to your health care provider about when you should start  having regular mammograms. This may depend on whether you have a family history of breast cancer. BRCA-related cancer screening. This may be done if you have a family history of breast, ovarian, tubal, or peritoneal cancers. Pelvic exam and Pap test. This may be done every 3 years starting at age 25. Starting at age 33, this may be done every 5 years if you have a Pap test in combination with an HPV test. Bone density scan. This is done to screen for osteoporosis. You may have this scan if you are at high risk for osteoporosis. Discuss your test results, treatment options, and if necessary, the need for more tests with your health care provider. Vaccines  Your health care provider may recommend certain vaccines, such as: Influenza vaccine. This is recommended every year. Tetanus, diphtheria, and acellular pertussis (Tdap, Td) vaccine. You may need a Td booster every 10 years. Zoster vaccine. You may need this after age 48. Pneumococcal 13-valent conjugate (PCV13) vaccine. You may need this if you have certain conditions and were not previously vaccinated. Pneumococcal polysaccharide (PPSV23) vaccine. You may need one or two doses if you smoke cigarettes or if you have certain conditions. Talk to your health care provider about which screenings and vaccines you need and how often you need them. This information is not intended to replace advice given to you by your health care provider. Make sure you discuss any questions you have with your health care provider. Document Released: 11/02/2015 Document Revised: 06/25/2016 Document Reviewed: 08/07/2015 Elsevier Interactive Patient Education  2017 [place] Prevention in the Home Falls can cause injuries. They can happen to people of all ages. There are many things you can do to make your home safe and to help prevent falls. What can I do on the outside of my home? Regularly fix the edges of walkways and driveways and fix any  cracks. Remove anything that might make you trip as you walk through a door, such as a raised step or threshold. Trim any bushes or trees on the path to your home. Use bright outdoor lighting. Clear any walking paths of anything that might make someone trip, such as rocks or tools. Regularly check to see if handrails are loose or broken. Make sure that both sides of any steps have handrails. Any raised decks and porches should have guardrails on the edges. Have any leaves, snow, or ice cleared regularly. Use sand or salt on walking paths during winter. Clean up any spills in your garage right away. This includes oil or grease spills. What can I do in the bathroom? Use night lights. Install grab bars by the toilet and in the tub and shower. Do not use towel bars as grab bars. Use non-skid mats or decals in the tub or shower. If you need to sit down in the  shower, use a plastic, non-slip stool. Keep the floor dry. Clean up any water that spills on the floor as soon as it happens. Remove soap buildup in the tub or shower regularly. Attach bath mats securely with double-sided non-slip rug tape. Do not have throw rugs and other things on the floor that can make you trip. What can I do in the bedroom? Use night lights. Make sure that you have a light by your bed that is easy to reach. Do not use any sheets or blankets that are too big for your bed. They should not hang down onto the floor. Have a firm chair that has side arms. You can use this for support while you get dressed. Do not have throw rugs and other things on the floor that can make you trip. What can I do in the kitchen? Clean up any spills right away. Avoid walking on wet floors. Keep items that you use a lot in easy-to-reach places. If you need to reach something above you, use a strong step stool that has a grab bar. Keep electrical cords out of the way. Do not use floor polish or wax that makes floors slippery. If you must  use wax, use non-skid floor wax. Do not have throw rugs and other things on the floor that can make you trip. What can I do with my stairs? Do not leave any items on the stairs. Make sure that there are handrails on both sides of the stairs and use them. Fix handrails that are broken or loose. Make sure that handrails are as long as the stairways. Check any carpeting to make sure that it is firmly attached to the stairs. Fix any carpet that is loose or worn. Avoid having throw rugs at the top or bottom of the stairs. If you do have throw rugs, attach them to the floor with carpet tape. Make sure that you have a light switch at the top of the stairs and the bottom of the stairs. If you do not have them, ask someone to add them for you. What else can I do to help prevent falls? Wear shoes that: Do not have high heels. Have rubber bottoms. Are comfortable and fit you well. Are closed at the toe. Do not wear sandals. If you use a stepladder: Make sure that it is fully opened. Do not climb a closed stepladder. Make sure that both sides of the stepladder are locked into place. Ask someone to hold it for you, if possible. Clearly mark and make sure that you can see: Any grab bars or handrails. First and last steps. Where the edge of each step is. Use tools that help you move around (mobility aids) if they are needed. These include: Canes. Walkers. Scooters. Crutches. Turn on the lights when you go into a dark area. Replace any light bulbs as soon as they burn out. Set up your furniture so you have a clear path. Avoid moving your furniture around. If any of your floors are uneven, fix them. If there are any pets around you, be aware of where they are. Review your medicines with your doctor. Some medicines can make you feel dizzy. This can increase your chance of falling. Ask your doctor what other things that you can do to help prevent falls. This information is not intended to replace  advice given to you by your health care provider. Make sure you discuss any questions you have with your health care provider. Document Released: 08/02/2009  Document Revised: 03/13/2016 Document Reviewed: 11/10/2014 Elsevier Interactive Patient Education  2017 [employer].

## 2022-10-28 ENCOUNTER — Encounter: Payer: Self-pay | Admitting: *Deleted

## 2022-11-11 ENCOUNTER — Ambulatory Visit: Payer: Self-pay | Admitting: *Deleted

## 2022-11-11 NOTE — Patient Instructions (Signed)
Visit Information  Thank you for taking time to visit with me today. Please don't hesitate to contact me if I can be of assistance to you before our next scheduled telephone appointment.  Following are the goals we discussed today:  Discuss shortness of breath and constipation with provider tomorrow.   Our next appointment is by telephone on 2/9  Please call the care guide team at [phone] if you need to cancel or reschedule your appointment.   Please call the Suicide and Crisis Lifeline: 988 call the [place] National Suicide Prevention Lifeline: (504)388-9296 or TTY: (704) 436-6086 TTY (252)637-0128) to talk to a trained counselor call 1-800-273-TALK (toll free, 24 hour hotline) call 911 if you are experiencing a Mental Health or [place] or need someone to talk to.  Patient verbalizes understanding of instructions and care plan provided today and agrees to view in [place]. Active MyChart status and patient understanding of how to access instructions and care plan via MyChart confirmed with patient.     The patient has been provided with contact information for the care management team and has been advised to call with any health related questions or concerns.   Valente David, RN, MSN, [place] Care Management Care Management Coordinator [phone]

## 2022-11-11 NOTE — Patient Outreach (Signed)
  Care Coordination   Follow Up Visit Note   11/11/2022 Name: Casey Sanders MRN: [id number] DOB: 09/08/1961  Casey Sanders is a 62 y.o. year old female who sees Casey Norlander, DO for primary care. I spoke with  Casey Sanders by phone today.  What matters to the patients health and wellness today?  Continues with GI issues, will have follow up tomorrow.     Goals Addressed               This Visit's Progress     Improve respiratory/throat symptoms (THN) (pt-stated)   On track     Care Coordination Interventions: Advised patient to self assesses Asthma action plan zone and make appointment with provider if in the yellow zone for 48 hours without improvement Discussed the importance of adequate rest and management of fatigue with Asthma Assessed for improvements of Upper respiratory infection Confirmed she continues to have a sore throat that is related to GI symptoms.  State she was told that she may need her esophagus stretched Patient report having follow up GI appointment on 1/24 Confirmed she will need assistance with a ride from [facility] (her new coverage for 2024) State she has been having problems with constipation to the point that it is difficult to sleep and having shortness of breath when lying flat.  She is not having daily Bms despite being on Linzess.  Had BM on Sunday but prior to that, last BM was a week ago        SDOH assessments and interventions completed:  No     Care Coordination Interventions:  Yes, provided   Follow up plan: Follow up call scheduled for 2/9    Encounter Outcome:  Pt. Visit Completed   Valente David, RN, MSN, [place] Care Management Care Management Coordinator [phone]

## 2022-11-12 DIAGNOSIS — K862 Cyst of pancreas: Secondary | ICD-10-CM | POA: Diagnosis not present

## 2022-11-12 DIAGNOSIS — R0989 Other specified symptoms and signs involving the circulatory and respiratory systems: Secondary | ICD-10-CM | POA: Diagnosis not present

## 2022-11-12 DIAGNOSIS — K5904 Chronic idiopathic constipation: Secondary | ICD-10-CM | POA: Diagnosis not present

## 2022-11-12 DIAGNOSIS — K224 Dyskinesia of esophagus: Secondary | ICD-10-CM | POA: Diagnosis not present

## 2022-11-12 DIAGNOSIS — Z1211 Encounter for screening for malignant neoplasm of colon: Secondary | ICD-10-CM | POA: Diagnosis not present

## 2022-11-12 DIAGNOSIS — R140 Abdominal distension (gaseous): Secondary | ICD-10-CM | POA: Diagnosis not present

## 2022-11-28 ENCOUNTER — Ambulatory Visit: Payer: Self-pay | Admitting: *Deleted

## 2022-11-28 NOTE — Patient Instructions (Addendum)
Visit Information  Thank you for taking time to visit with me today. Please don't hesitate to contact me if I can be of assistance to you.   Following are the goals we discussed today:   Goals Addressed               This Visit's Progress     Patient Stated     Manage hypertension (THN ) (pt-stated)   Not on track     Care Coordination Interventions: Evaluation of current treatment plan related to hypertension self management and patient's adherence to plan as established by provider Discussed plans with patient for ongoing care management follow up and provided patient with direct contact information for care management team Advised patient, providing education and rationale, to monitor blood pressure daily and record, calling PCP for findings outside established parameters Assessed social determinant of health barriers Encouraged to take her medicines as ordered       manage memory concerns [facility]) (pt-stated)   On track     06/11/22 Care Coordination Interventions: Discussed plans with patient for ongoing care management follow up and provided patient with direct contact information for care management team Encouragement provided with her use of writing down things to remember in her notebook. Also suggested a tape recorder .        Patient will manage bowel elimination at home [facility]) (pt-stated)   Not on track     Care Coordination Interventions: Evaluation of current treatment plan related to bowel management and patient's adherence to plan as established by provider Reviewed medications with patient and discussed Linzess & Omeprazole  Discussed plans with patient for ongoing care management follow up and provided patient with direct contact information for care management team Spoke with nurse at [place] [phone] to review patient voiced concerns about watery stools, use of pullups/diapers to assist with preventing accidents        Our next appointment is by telephone  on 12/01/22 at 1130  Please call the care guide team at [phone] if you need to cancel or reschedule your appointment.   If you are experiencing a Mental Health or [place] or need someone to talk to, please call the Suicide and Crisis Lifeline: 988 call the [place] National Suicide Prevention Lifeline: (907) 635-0541 or TTY: 203-590-0315 TTY 5757161684) to talk to a trained counselor call 1-800-273-TALK (toll free, 24 hour hotline) call the [facility]: [phone] call 911   Patient verbalizes understanding of instructions and care plan provided today and agrees to view in [place]. Active MyChart status and patient understanding of how to access instructions and care plan via MyChart confirmed with patient.     The patient has been provided with contact information for the care management team and has been advised to call with any health related questions or concerns.    Dedra Matsuo L. Lavina Hamman, RN, BSN, [place] Coordinator Office number [phone]

## 2022-11-28 NOTE — Patient Outreach (Addendum)
Care Coordination   Follow Up Visit Note   11/28/2022 Name: Taylen Joiner MRN: [id number] DOB: 05/01/1961  Yalena Boker is a 62 y.o. year old female who sees [name], DO for primary care. I spoke with  [name] by phone today.  What matters to the patients health and wellness today?  Reports she feels better after her medicine was changed to a capsule  She reports her medicines are causing clear liquid stools each time she takes it She reports she is not able at times to get to her bathroom before she has an accident Has tried to take medicine every other day She states she called GI nurse to report the diarrhea Reports she is aware that the throat can be stretched again Continues Linzess Wears diapers to prevent accidents    Goals Addressed               This Visit's Progress     Patient Stated     Manage hypertension (THN ) (pt-stated)   Not on track     Care Coordination Interventions: Evaluation of current treatment plan related to hypertension self management and patient's adherence to plan as established by provider Discussed plans with patient for ongoing care management follow up and provided patient with direct contact information for care management team Advised patient, providing education and rationale, to monitor blood pressure daily and record, calling PCP for findings outside established parameters Assessed social determinant of health barriers Encouraged to take her medicines as ordered       manage memory concerns [facility]) (pt-stated)   On track     06/11/22 Care Coordination Interventions: Discussed plans with patient for ongoing care management follow up and provided patient with direct contact information for care management team Encouragement provided with her use of writing down things to remember in her notebook. Also suggested a tape recorder .        Patient will manage bowel elimination at home [facility]) (pt-stated)   Not on track      Care Coordination Interventions: Evaluation of current treatment plan related to bowel management and patient's adherence to plan as established by provider Reviewed medications with patient and discussed Linzess & Omeprazole  Discussed plans with patient for ongoing care management follow up and provided patient with direct contact information for care management team Spoke with nurse at [place] [phone] to review patient voiced concerns about watery stools, use of pullups/diapers to assist with preventing accidents        SDOH assessments and interventions completed:  No     Care Coordination Interventions:  Yes, provided  Interventions Today    Flowsheet Row Most Recent Value  Chronic Disease Discussed/Reviewed   Chronic disease discussed/reviewed during today's visit Other  General Interventions   General Interventions Discussed/Reviewed General Interventions Discussed, Sick Day Rules  [discussed possible options to decrease diarrhea]  Education Interventions   Education Provided Provided Verbal Education  Provided Verbal Education On Medication  Nutrition Interventions   Nutrition Discussed/Reviewed Nutrition Reviewed, Fluid intake  Pharmacy Interventions   Pharmacy Dicussed/Reviewed Pharmacy Topics Discussed, Medications and their functions  [call to her GI medical assistance Judeen Hammans to discuss patient voiced concerns of continued watery stool after taking medication- preventing her from leaving the home and having to wear diapers]       Follow up plan: Follow up call scheduled for 12/01/22    Encounter Outcome:  Pt. Visit Completed   Arshan Jabs L. Lavina Hamman, RN, BSN, CCM  [place] Office number [phone]

## 2022-11-28 NOTE — Patient Outreach (Signed)
  Care Coordination   Follow up and ca  Visit Note   11/28/2022 Name: Casey Sanders MRN: [id number] DOB: 07-05-1961  Casey Sanders is a 62 y.o. year old female who sees Casey Norlander, DO for primary care. I spoke with  Casey Sanders by phone today.  What matters to the patients health and wellness today?  Followed up with patient after a call from GI staff to review her medications and bowel elimination.    Goals Addressed               This Visit's Progress     Patient Stated     Manage hypertension (THN ) (pt-stated)   Not on track     Care Coordination Interventions: Evaluation of current treatment plan related to hypertension self management and patient's adherence to plan as established by provider Discussed plans with patient for ongoing care management follow up and provided patient with direct contact information for care management team Advised patient, providing education and rationale, to monitor blood pressure daily and record, calling PCP for findings outside established parameters Assessed social determinant of health barriers Encouraged to take her medicines as ordered       manage memory concerns [facility]) (pt-stated)   On track     06/11/22 Care Coordination Interventions: Discussed plans with patient for ongoing care management follow up and provided patient with direct contact information for care management team Encouragement provided with her use of writing down things to remember in her notebook. Also suggested a tape recorder .        Patient will manage bowel elimination at home [facility]) (pt-stated)   Not on track     Care Coordination Interventions: Evaluation of current treatment plan related to bowel management and patient's adherence to plan as established by provider Reviewed medications with patient and discussed Linzess & Omeprazole  Discussed plans with patient for ongoing care management follow up and provided patient with direct contact  information for care management team Spoke with nurse at [place] [phone] to review patient voiced concerns about watery stools, use of pullups/diapers to assist with preventing accidents        SDOH assessments and interventions completed:  No     Care Coordination Interventions:  Yes, provided   Follow up plan: Follow up call scheduled for 12/01/22    Encounter Outcome:  Pt. Visit Completed   Casey Hoiland L. Lavina Hamman, RN, BSN, [place] Coordinator Office number [phone]

## 2022-12-01 ENCOUNTER — Ambulatory Visit: Payer: Self-pay | Admitting: *Deleted

## 2022-12-01 NOTE — Patient Outreach (Signed)
  Care Coordination   Follow Up Visit Note   12/01/2022 Name: Casey Sanders MRN: [id number] DOB: 11/06/1960  Casey Sanders is a 62 y.o. year old female who sees [name], DO for primary care. I spoke with  Casey Sanders by phone today.  What matters to the patients health and wellness today?  Change in Bowel elimination treatment plan    Goals Addressed               This Visit's Progress     Patient Stated     Patient will manage bowel elimination at home [facility]) (pt-stated)   On track     Care Coordination Interventions: Evaluation of current treatment plan related to bowel management and patient's adherence to plan as established by provider Reviewed medications with patient and discussed Linzess & Omeprazole  Discussed plans with patient for ongoing care management follow up and provided patient with direct contact information for care management team Received a return call from nurse at [place] [phone] to discuss GI new plan of care - stop Linzess 290, use miralax or metamucil 1-2 time to prevent constipation as needed Potentially may go back to taking Linzess 145 mg  Discussed the call from GI nurse with new bowel elimination treatment plan. Teach back used to verify patient understood new treatment plan        SDOH assessments and interventions completed:  No     Care Coordination Interventions:  Yes, provided   Interventions Today    Flowsheet Row Most Recent Value  Chronic Disease Discussed/Reviewed   Chronic disease discussed/reviewed during today's visit Other  [bowel elimination]  General Interventions   General Interventions Discussed/Reviewed General Interventions Discussed, General Interventions Reviewed, Sick Day Rules  Education Interventions   Provided Verbal Education On Sick Day Rules      Interventions Today    Flowsheet Row Most Recent Value  Chronic Disease Discussed/Reviewed   Chronic disease discussed/reviewed during today's  visit Other  [bowel elimination]  General Interventions   General Interventions Discussed/Reviewed General Interventions Discussed, General Interventions Reviewed, Sick Day Rules  Education Interventions   Provided Verbal Education On Sick Day Rules        Follow up plan: Follow up call scheduled for 12/30/22    Encounter Outcome:  Pt. Visit Completed   Kimblery Diop L. Lavina Hamman, RN, BSN, [place] Coordinator Office number [phone]

## 2022-12-01 NOTE — Patient Instructions (Signed)
Visit Information  Thank you for taking time to visit with me today. Please don't hesitate to contact me if I can be of assistance to you.   Following are the goals we discussed today:   Goals Addressed               This Visit's Progress     Patient Stated     Patient will manage bowel elimination at home [facility]) (pt-stated)   On track     Care Coordination Interventions: Evaluation of current treatment plan related to bowel management and patient's adherence to plan as established by provider Reviewed medications with patient and discussed Linzess & Omeprazole  Discussed plans with patient for ongoing care management follow up and provided patient with direct contact information for care management team Received a return call from nurse at [place] [phone] to discuss GI new plan of care - stop Linzess 290, use miralax or metamucil 1-2 time to prevent constipation as needed Potentially may go back to taking Linzess 145 mg  Discussed the call from GI nurse with new bowel elimination treatment plan. Teach back used to verify patient understood new treatment plan        Our next appointment is by telephone on 12/30/22 at 1130  Please call the care guide team at [phone] if you need to cancel or reschedule your appointment.   If you are experiencing a Mental Health or [place] or need someone to talk to, please call the Suicide and Crisis Lifeline: 988 call the [place] National Suicide Prevention Lifeline: (928) 417-5129 or TTY: 4785631711 TTY 229-369-2586) to talk to a trained counselor call 1-800-273-TALK (toll free, 24 hour hotline) call the [facility]: 731-653-8061 call 911   Patient verbalizes understanding of instructions and care plan provided today and agrees to view in [place]. Active MyChart status and patient understanding of how to access instructions and care plan via MyChart confirmed with patient.     The patient has been  provided with contact information for the care management team and has been advised to call with any health related questions or concerns.    Seline Enzor L. Lavina Hamman, RN, BSN, [place] Coordinator Office number [phone]

## 2022-12-30 ENCOUNTER — Ambulatory Visit: Payer: Self-pay | Admitting: *Deleted

## 2022-12-30 NOTE — Patient Outreach (Signed)
  Care Coordination   12/30/2022 Name: Casey Sanders MRN: [id number] DOB: 03-Jan-1961   Care Coordination Outreach Attempts:  An unsuccessful telephone outreach was attempted today to offer the patient information about available care coordination services as a benefit of their health plan.   Follow Up Plan:  Additional outreach attempts will be made to offer the patient care coordination information and services.   Encounter Outcome:  No Answer   Care Coordination Interventions:  No, not indicated    Iriana Artley L. Lavina Hamman, RN, BSN, [place] Coordinator Office number [phone]

## 2023-01-13 ENCOUNTER — Ambulatory Visit: Payer: Self-pay | Admitting: *Deleted

## 2023-01-13 ENCOUNTER — Encounter: Payer: Self-pay | Admitting: *Deleted

## 2023-01-13 DIAGNOSIS — K219 Gastro-esophageal reflux disease without esophagitis: Secondary | ICD-10-CM | POA: Insufficient documentation

## 2023-01-13 NOTE — Patient Outreach (Signed)
  Care Coordination   Follow Up Visit Note   12/02/2023 late entry for 01/13/23 Name: Casey Sanders MRN: [id number] DOB: April 17, 1961  Casey Sanders is a 62 y.o. year old female who sees Raliegh Ip, DO for primary care. I spoke with  [name] by phone today.  What matters to the patients health and wellness today?  Patient reporting she is feeling better. She reports she went out with an older church member.  Patient is talking today  [name] crafts keeping her busy back to taking Linzess 145 mg and tolerating well No longer dehydration   Hair is falling out - uses protein medicine history of IBD & stress/anxiety Discussed supporting her gut bacteria so important nutrients are able to absorb to got to other organs Suggest use of probiotics and protein shampoos (Redken extreme, Aveeno protein blend   Goals Addressed               This Visit's Progress     Patient Stated     COMPLETED: Improve respiratory/throat symptoms (THN) (pt-stated)   On track     Care Coordination Interventions: Advised patient to self assesses Asthma action plan zone and make appointment with provider if in the yellow zone for 48 hours without improvement Discussed the importance of adequate rest and management of fatigue with Asthma Assessed for improvements of Upper respiratory infection Confirmed she continues to improve goal completed      manage memory concerns [facility]) (pt-stated)   On track     06/11/22 Care Coordination Interventions: Discussed plans with patient for ongoing care management follow up and provided patient with direct contact information for care management team Encouragement provided with her use of writing down things to remember in her notebook. Also suggested a tape recorder .        Patient will manage bowel elimination at home [facility]) (pt-stated)   On track     Care Coordination Interventions: Evaluation of current treatment plan related to bowel management and patient's  adherence to plan as established by provider Reviewed medications with patient and discussed Linzess & Omeprazole  Discussed plans with patient for ongoing care management follow up and provided patient with direct contact information for care management team Confirmed she is back to taking Linzess 145 mg and having stools 0-1 a day      COMPLETED: Receive any available resources for financial strain, food insecurity (THN) (pt-stated)   On track     Care Coordination Interventions: Provided patient and/or caregiver with local  information about resources for food insecurity, financial strain Nurse, learning disability) Discussed plans with patient for ongoing care management follow up and provided patient with direct contact information for care management team Assessed social determinant of health barriers Re assessed for food insecurity, Confirm she has food in the home Confirmed completed collaboration with [facility] care guide - complete goal        SDOH assessments and interventions completed:  No     Care Coordination Interventions:  Yes, provided   Follow up plan: Follow up call scheduled for 01/13/24    Encounter Outcome:  Pt. Visit Completed   Casey Sanders L. Casey Penner, RN, BSN, CCM [facility] Care Management Community Coordinator Office number [phone]

## 2023-01-13 NOTE — Patient Outreach (Signed)
  Care Coordination   01/13/2023 Name: Casey Sanders MRN: [id number] DOB: [birth year]   Care Coordination Outreach Attempts:  A second unsuccessful outreach was attempted today to offer the patient with information about available care coordination services as a benefit of their health plan.     Follow Up Plan:  Additional outreach attempts will be made to offer the patient care coordination information and services.   Encounter Outcome:  No Answer   Care Coordination Interventions:  No, not indicated      Aylee Littrell L. Lavina Hamman, RN, BSN, [place] Coordinator Office number [phone]

## 2023-01-13 NOTE — Patient Instructions (Signed)
Visit Information  Thank you for taking time to visit with me today. Please don't hesitate to contact me if I can be of assistance to you.   Following are the goals we discussed today:   Goals Addressed               This Visit's Progress     Patient Stated     manage memory concerns [facility]) (pt-stated)   On track     06/11/22 Care Coordination Interventions: Discussed plans with patient for ongoing care management follow up and provided patient with direct contact information for care management team Encouragement provided with her use of writing down things to remember in her notebook. Also suggested a tape recorder .          Our next appointment is by telephone on 01/13/24 at 1130  Please call the care guide team at [phone] if you need to cancel or reschedule your appointment.   If you are experiencing a Mental Health or Behavioral Health Crisis or need someone to talk to, please call the Suicide and Crisis Lifeline: 988 call the [place] National Suicide Prevention Lifeline: 858-349-5357 or TTY: 503-540-0802 TTY 702-179-9958) to talk to a trained counselor call 1-800-273-TALK (toll free, 24 hour hotline) call the [facility]: 681-524-9909 call 911   Patient verbalizes understanding of instructions and care plan provided today and agrees to view in MyChart. Active MyChart status and patient understanding of how to access instructions and care plan via MyChart confirmed with patient.     The patient has been provided with contact information for the care management team and has been advised to call with any health related questions or concerns.   Avyay Coger L. [name], RN, BSN, CCM [facility] Care Management Community Coordinator Office number [phone]

## 2023-02-13 DIAGNOSIS — E119 Type 2 diabetes mellitus without complications: Secondary | ICD-10-CM | POA: Diagnosis not present

## 2023-02-13 DIAGNOSIS — Z961 Presence of intraocular lens: Secondary | ICD-10-CM | POA: Diagnosis not present

## 2023-02-13 DIAGNOSIS — H52223 Regular astigmatism, bilateral: Secondary | ICD-10-CM | POA: Diagnosis not present

## 2023-02-13 DIAGNOSIS — H524 Presbyopia: Secondary | ICD-10-CM | POA: Diagnosis not present

## 2023-02-13 DIAGNOSIS — H5203 Hypermetropia, bilateral: Secondary | ICD-10-CM | POA: Diagnosis not present

## 2023-02-13 LAB — HM DIABETES EYE EXAM

## 2023-02-25 DIAGNOSIS — E113413 Type 2 diabetes mellitus with severe nonproliferative diabetic retinopathy with macular edema, bilateral: Secondary | ICD-10-CM | POA: Diagnosis not present

## 2023-02-25 DIAGNOSIS — H35033 Hypertensive retinopathy, bilateral: Secondary | ICD-10-CM | POA: Diagnosis not present

## 2023-02-25 DIAGNOSIS — H43822 Vitreomacular adhesion, left eye: Secondary | ICD-10-CM | POA: Diagnosis not present

## 2023-02-25 DIAGNOSIS — H3563 Retinal hemorrhage, bilateral: Secondary | ICD-10-CM | POA: Diagnosis not present

## 2023-02-25 DIAGNOSIS — H43811 Vitreous degeneration, right eye: Secondary | ICD-10-CM | POA: Diagnosis not present

## 2023-03-15 ENCOUNTER — Other Ambulatory Visit: Payer: Self-pay | Admitting: Family Medicine

## 2023-03-15 DIAGNOSIS — E1169 Type 2 diabetes mellitus with other specified complication: Secondary | ICD-10-CM

## 2023-04-14 ENCOUNTER — Other Ambulatory Visit: Payer: Self-pay | Admitting: Family Medicine

## 2023-04-14 DIAGNOSIS — E1169 Type 2 diabetes mellitus with other specified complication: Secondary | ICD-10-CM

## 2023-04-28 ENCOUNTER — Ambulatory Visit: Payer: Medicare Other | Admitting: Family Medicine

## 2023-05-01 ENCOUNTER — Ambulatory Visit: Payer: Medicare HMO | Admitting: Family Medicine

## 2023-05-01 ENCOUNTER — Encounter: Payer: Self-pay | Admitting: Family Medicine

## 2023-05-01 VITALS — BP 142/79 | HR 75 | Temp 97.2°F | Ht 61.0 in | Wt 167.0 lb

## 2023-05-01 DIAGNOSIS — E1169 Type 2 diabetes mellitus with other specified complication: Secondary | ICD-10-CM | POA: Diagnosis not present

## 2023-05-01 DIAGNOSIS — F419 Anxiety disorder, unspecified: Secondary | ICD-10-CM

## 2023-05-01 DIAGNOSIS — F32A Depression, unspecified: Secondary | ICD-10-CM | POA: Diagnosis not present

## 2023-05-01 DIAGNOSIS — E1159 Type 2 diabetes mellitus with other circulatory complications: Secondary | ICD-10-CM | POA: Diagnosis not present

## 2023-05-01 DIAGNOSIS — Z794 Long term (current) use of insulin: Secondary | ICD-10-CM | POA: Diagnosis not present

## 2023-05-01 DIAGNOSIS — E039 Hypothyroidism, unspecified: Secondary | ICD-10-CM

## 2023-05-01 DIAGNOSIS — Z23 Encounter for immunization: Secondary | ICD-10-CM

## 2023-05-01 DIAGNOSIS — E785 Hyperlipidemia, unspecified: Secondary | ICD-10-CM

## 2023-05-01 DIAGNOSIS — I152 Hypertension secondary to endocrine disorders: Secondary | ICD-10-CM

## 2023-05-01 LAB — BAYER DCA HB A1C WAIVED: HB A1C (BAYER DCA - WAIVED): 10 % — ABNORMAL HIGH (ref 4.8–5.6)

## 2023-05-01 MED ORDER — BLOOD GLUCOSE TEST VI STRP: ORAL_STRIP | 99 refills | Status: AC

## 2023-05-01 MED ORDER — LANCET DEVICE MISC: 0 refills | Status: AC

## 2023-05-01 MED ORDER — LANTUS SOLOSTAR 100 UNIT/ML ~~LOC~~ SOPN
10.0000 [IU] | PEN_INJECTOR | Freq: Every day | SUBCUTANEOUS | 1 refills | Status: DC
Start: 2023-05-01 — End: 2023-05-29

## 2023-05-01 MED ORDER — LANCETS MISC. MISC: 99 refills | Status: DC

## 2023-05-01 MED ORDER — BLOOD GLUCOSE MONITORING SUPPL DEVI: 0 refills | Status: AC

## 2023-05-01 MED ORDER — FREESTYLE LIBRE READER DEVI: 1.0000 [IU] | Freq: Every day | 1 refills | Status: DC

## 2023-05-01 MED ORDER — INSULIN PEN NEEDLE 32G X 6 MM MISC: 3 refills | Status: AC

## 2023-05-01 MED ORDER — FREESTYLE LIBRE 3 SENSOR MISC: 3 refills | Status: DC

## 2023-05-01 MED ORDER — MIRTAZAPINE 15 MG PO TABS: 15.0000 mg | ORAL_TABLET | Freq: Every day | ORAL | 3 refills | Status: DC

## 2023-05-01 NOTE — Progress Notes (Unsigned)
Subjective: CC:DM PCP: Raliegh Ip, DO [id number] [place] is a 62 y.o. female presenting to clinic today for:  1. Type 2 Diabetes with hypertension, hyperlipidemia:  She has had diet-controlled diabetes previously but has been lost to follow-up since September.  She admits that she has a much more rigorous appetite now and eats quite a bit.  She does not really exercise much and spends most of her time sitting at home.  She reports symptoms of loneliness see below.  She is compliant with her Lipitor, lisinopril  Last eye exam: needs Last foot exam: UTD Last A1c:  Lab Results  Component Value Date   HGBA1C 7.7 (H) 07/07/2022   Nephropathy screen indicated?: needs Last flu, zoster and/or pneumovax:  Immunization History  Administered Date(s) Administered   Influenza Whole 10/20/2006   Influenza,inj,Quad PF,6+ Mos 08/22/2013, 09/07/2014, 07/15/2016, 08/20/2017, 07/05/2019, 07/07/2022   Moderna Sars-Covid-2 Vaccination 03/28/2020, 04/26/2020   Pneumococcal Conjugate-13 09/07/2014   Pneumococcal Polysaccharide-23 07/15/2016   Td 10/20/2002   Td (Adult), 2 Lf Tetanus Toxid, Preservative Free 10/20/2002    ROS: No chest pain, shortness of breath  2.  Difficulty with sleep, anxiety, depression Patient reports symptoms of loneliness.  She really only ever gets to see her children and grandchildren.  She misses her spouse, who passed away 5 years ago.  She has lapsed in the mirtazapine but wants to go back on it.   ROS: Per HPI  Allergies  Allergen Reactions   Codeine Hives, Nausea And Vomiting and Other (See Comments)    Other reaction(s): unknown   Empagliflozin     Caused blisters Other reaction(s): Not available   Metformin And Related     nausea   Niaspan [Niacin Er] Other (See Comments)    Increase blood glucose   Past Medical History:  Diagnosis Date   Allergic rhinitis    Anxiety and depression    Asthma    Benign essential HTN 01/04/2011   Chest pain     a. Myoview 9/05: no ischemia, no scar, EF 70%   Chest pain, unspecified 02/12/2011   Cystitis 02/08/2015   Diverticulosis    DM2 (diabetes mellitus, type 2) (HCC)    Extremity pain 01/04/2013   Fibromyalgia    Frequent UTI    GERD (gastroesophageal reflux disease)    Hiatal Hernia   HLD (hyperlipidemia)    HTN (hypertension)    Left rotator cuff tear    Low back pain 01/04/2013   Lumbar disc disease    Nephrolithiasis    Obesity    Palpitations    monitor 9/05: NSR, sinus tachy, PVCs   Pre-operative cardiovascular examination 02/12/2011   Rectal bleeding 08/22/2013   Sepsis secondary to UTI (HCC) 08/09/2017   uterine ca dx'd 1988   per pt    Current Outpatient Medications:    Accu-Chek Softclix Lancets lancets, Please use to test blood sugar 3 times daily as directed. DX: E11.9, Disp: 100 each, Rfl: 12   albuterol (VENTOLIN HFA) 108 (90 Base) MCG/ACT inhaler, INHALE 2 PUFFS BY MOUTH EVERY 6 HOURS AS NEEDED FOR WHEEZING AND FOR SHORTNESS OF BREATH, Disp: 9 g, Rfl: 0   aspirin 81 MG EC tablet, Take 81 mg by mouth daily., Disp: , Rfl:    atorvastatin (LIPITOR) 80 MG tablet, Take 1 tablet (80 mg total) by mouth daily., Disp: 30 tablet, Rfl: 0   Blood Glucose Monitoring Suppl (ACCU-CHEK GUIDE ME) w/Device KIT, Please use to test blood sugar 3 times daily as  directed. DX: E11.9 (Patient not taking: Reported on 10/27/2022), Disp: 1 kit, Rfl: 0   ciclopirox (PENLAC) 8 % solution, Apply topically at bedtime. Apply over nail and surrounding skin. Apply daily over previous coat. After seven (7) days, may remove with alcohol and continue cycle., Disp: 6.6 mL, Rfl: 0   diclofenac sodium (VOLTAREN) 1 % GEL, Apply 2 g topically 4 (four) times daily., Disp: 100 g, Rfl: 1   fish oil-omega-3 fatty acids 1000 MG capsule, Take 2 g by mouth daily., Disp: , Rfl:    glucose blood (ACCU-CHEK GUIDE) test strip, Please use to test blood sugar 3 times daily as directed. DX: E11.9 (Patient not taking: Reported on  10/27/2022), Disp: 100 each, Rfl: 12   linaclotide (LINZESS) 145 MCG CAPS capsule, 1 capsule at least 30 minutes before the first meal of the day on an empty stomach, Disp: , Rfl:    lisinopril (ZESTRIL) 20 MG tablet, Take 1 tablet (20 mg total) by mouth daily., Disp: 90 tablet, Rfl: 3   mirtazapine (REMERON) 7.5 MG tablet, Take 1 tablet (7.5 mg total) by mouth at bedtime., Disp: 90 tablet, Rfl: 3 Social History   Socioeconomic History   Marital status: Widowed    Spouse name: Not on file   Number of children: 2   Years of education: 56   Highest education level: 12th grade  Occupational History   Occupation: disabled  Tobacco Use   Smoking status: Never    Passive exposure: Never   Smokeless tobacco: Never  Vaping Use   Vaping status: Never Used  Substance and Sexual Activity   Alcohol use: No   Drug use: No   Sexual activity: Not Currently    Partners: Male  Other Topics Concern   Not on file  Social History Narrative   2 daughters and 4 grandchildren.   Husband died in 2018/05/10.   Social Determinants of Health   Financial Resource Strain: Low Risk  (10/27/2022)   Overall Financial Resource Strain (CARDIA)    Difficulty of Paying Living Expenses: Not very hard  Food Insecurity: No Food Insecurity (10/27/2022)   Hunger Vital Sign    Worried About Running Out of Food in the Last Year: Never true    Ran Out of Food in the Last Year: Never true  Transportation Needs: No Transportation Needs (10/27/2022)   PRAPARE - Administrator, Civil Service (Medical): No    Lack of Transportation (Non-Medical): No  Physical Activity: Inactive (10/27/2022)   Exercise Vital Sign    Days of Exercise per Week: 0 days    Minutes of Exercise per Session: 0 min  Stress: No Stress Concern Present (10/27/2022)   [employer] of Occupational Health - Occupational Stress Questionnaire    Feeling of Stress : Only a little  Social Connections: Moderately Integrated (10/27/2022)   Social  Connection and Isolation Panel [NHANES]    Frequency of Communication with Friends and Family: More than three times a week    Frequency of Social Gatherings with Friends and Family: Three times a week    Attends Religious Services: More than 4 times per year    Active Member of Clubs or Organizations: Yes    Attends Banker Meetings: More than 4 times per year    Marital Status: Widowed  Intimate Partner Violence: Not At Risk (10/27/2022)   Humiliation, Afraid, Rape, and Kick questionnaire    Fear of Current or Ex-Partner: No    Emotionally Abused: No  Physically Abused: No    Sexually Abused: No   Family History  Problem Relation Age of Onset   Coronary artery disease Mother    Coronary artery disease Father    Heart disease Brother    Heart disease Brother    Lung cancer Brother 33   Heart attack Brother 67       had a few heart attacks before   Heart disease Brother    Breast cancer Neg Hx     Objective: Office vital signs reviewed. BP (!) 142/79   Pulse 75   Temp (!) 97.2 F (36.2 C)   Ht 5\' 1"  (1.549 m)   Wt 167 lb (75.8 kg)   SpO2 96%   BMI 31.55 kg/m   Physical Examination:  General: Awake, alert, well nourished, No acute distress HEENT: sclera white, MMM Cardio: regular rate and rhythm, S1S2 heard, no murmurs appreciated Pulm: clear to auscultation bilaterally, no wheezes, rhonchi or rales; normal work of breathing on room air Psych: depressed. Intermittently tearful when talking about her deceased husband     May 02, 2023   10:57 AM 05-02-23   10:39 AM 10/27/2022   11:06 AM  Depression screen PHQ 2/9  Decreased Interest 1 0 0  Down, Depressed, Hopeless 1 0 0  PHQ - 2 Score 2 0 0  Altered sleeping 2    Tired, decreased energy 0    Change in appetite 0    Feeling bad or failure about yourself  1    Trouble concentrating 1    Moving slowly or fidgety/restless 0    Suicidal thoughts 0    PHQ-9 Score 6        07/07/2022    3:35 PM  03/13/2022   10:43 AM 02/20/2022   11:12 AM 02/19/2021   10:21 AM  GAD 7 : Generalized Anxiety Score  Nervous, Anxious, on Edge 1 1 0 2  Control/stop worrying 0 0 0 2  Worry too much - different things 0 0 0 2  Trouble relaxing 0 1 1 3   Restless 0 0 1 2  Easily annoyed or irritable 0 0 0 3  Afraid - awful might happen 0 0 0 2  Total GAD 7 Score 1 2 2 16   Anxiety Difficulty Not difficult at all Somewhat difficult  Somewhat difficult    Assessment/ Plan: 62 y.o. female   Type 2 diabetes mellitus with other specified complication, with long-term current use of insulin (HCC) - Plan: Bayer DCA Hb A1c Waived, CMP14+EGFR, Microalbumin / creatinine urine ratio, Insulin Pen Needle 32G X 6 MM MISC, Continuous Glucose Receiver (FREESTYLE LIBRE READER) DEVI, Continuous Glucose Sensor (FREESTYLE LIBRE 3 SENSOR) MISC, Blood Glucose Monitoring Suppl DEVI, Glucose Blood (BLOOD GLUCOSE TEST STRIPS) STRP, Lancet Device MISC, Lancets Misc. MISC, insulin glargine (LANTUS SOLOSTAR) 100 UNIT/ML Solostar Pen  Hypertension associated with diabetes (HCC) - Plan: CMP14+EGFR, Microalbumin / creatinine urine ratio  Hyperlipidemia associated with type 2 diabetes mellitus (HCC) - Plan: CMP14+EGFR, Lipid Panel  Acquired hypothyroidism - Plan: TSH, T4, Free  Anxiety - Plan: mirtazapine (REMERON) 15 MG tablet  Depressive disorder - Plan: mirtazapine (REMERON) 15 MG tablet  Need for Tdap vaccination - Plan: Tdap vaccine greater than or equal to 7yo IM  Given her GI issues I am very hesitant to restart any type of GIP or GLP class of medicine.  She has had multiple intolerances to medications including metformin, Jardiance.  Given the A1c at 12 today I am not confident that something  like glipizide or Januvia will be sufficient in controlling her blood sugars.  For this reason we are going to start her on insulin.  I have encouraged her to bring in her insulin pen and new freestyle libre sensor to the office so that we can  show her how to apply and utilize appropriately.  She was amenable to this.  Orders have been sent.  Would like to see her back closely for recheck.  And we will continue titrating her medications accordingly.  In the interim I have recommended that she increase her insulin by 1 unit every 2 days for fasting blood sugar above 150.  Blood pressure is NOT controlled.  No changes for now.  Will see her back in 1 month for DM and BP.  Check fasting lipid  Check thyroid levels  Anxiety and depression are not well-controlled.  I suspect that this has a lot to do with isolation behaviors.  I offered integrated behavioral health but she declined this today.  Mirtazapine restarted but at 15 mg this time  Tetanus shot administered  No orders of the defined types were placed in this encounter.  No orders of the defined types were placed in this encounter.    Raliegh Ip, DO [facility] [phone]

## 2023-05-01 NOTE — Patient Instructions (Signed)
Start with just 10 units injected into the skin at bedtime. In 1 week, you can increase as directed below.  If your FASTING blood sugar is above 150 for 2 consecutive days, increase by 1 unit of your long acting insulin.  Example: Day 1: Blood sugar was 159 Day 2: Blood sugar was 205  Increase insulin to 11 units Day 3: Blood sugar is 202 Day 4: Blood sugars 168  Increase insulin to 12 units Day 5: Blood sugar is 105 Day 6: Blood sugars 145  Stick with 12 units.  Do not increase.

## 2023-05-02 LAB — CMP14+EGFR
ALT: 28 IU/L (ref 0–32)
AST: 19 IU/L (ref 0–40)
Albumin: 4.7 g/dL (ref 3.9–4.9)
Alkaline Phosphatase: 67 IU/L (ref 44–121)
BUN/Creatinine Ratio: 16 (ref 12–28)
BUN: 14 mg/dL (ref 8–27)
Bilirubin Total: 1.1 mg/dL (ref 0.0–1.2)
CO2: 23 mmol/L (ref 20–29)
Calcium: 10 mg/dL (ref 8.7–10.3)
Chloride: 95 mmol/L — ABNORMAL LOW (ref 96–106)
Creatinine, Ser: 0.89 mg/dL (ref 0.57–1.00)
Globulin, Total: 2.4 g/dL (ref 1.5–4.5)
Glucose: 343 mg/dL — ABNORMAL HIGH (ref 70–99)
Potassium: 4.1 mmol/L (ref 3.5–5.2)
Sodium: 133 mmol/L — ABNORMAL LOW (ref 134–144)
Total Protein: 7.1 g/dL (ref 6.0–8.5)
eGFR: 72 mL/min/{1.73_m2} (ref >59)

## 2023-05-02 LAB — LIPID PANEL
Chol/HDL Ratio: 2 ratio (ref 0.0–4.4)
Cholesterol, Total: 128 mg/dL (ref 100–199)
HDL: 63 mg/dL (ref >39)
LDL Chol Calc (NIH): 47 mg/dL (ref 0–99)
Triglycerides: 96 mg/dL (ref 0–149)
VLDL Cholesterol Cal: 18 mg/dL (ref 5–40)

## 2023-05-02 LAB — TSH: TSH: 4.07 u[IU]/mL (ref 0.450–4.500)

## 2023-05-02 LAB — MICROALBUMIN / CREATININE URINE RATIO
Creatinine, Urine: 310.3 mg/dL
Microalb/Creat Ratio: 211 mg/g creat — ABNORMAL HIGH (ref 0–29)
Microalbumin, Urine: 654 ug/mL

## 2023-05-02 LAB — T4, FREE: Free T4: 1.31 ng/dL (ref 0.82–1.77)

## 2023-05-29 ENCOUNTER — Ambulatory Visit (INDEPENDENT_AMBULATORY_CARE_PROVIDER_SITE_OTHER): Payer: Medicare HMO | Admitting: Family Medicine

## 2023-05-29 ENCOUNTER — Encounter: Payer: Self-pay | Admitting: Family Medicine

## 2023-05-29 VITALS — BP 183/91 | HR 77 | Temp 98.6°F | Ht 61.0 in | Wt 173.0 lb

## 2023-05-29 DIAGNOSIS — E1169 Type 2 diabetes mellitus with other specified complication: Secondary | ICD-10-CM

## 2023-05-29 DIAGNOSIS — I152 Hypertension secondary to endocrine disorders: Secondary | ICD-10-CM

## 2023-05-29 DIAGNOSIS — F419 Anxiety disorder, unspecified: Secondary | ICD-10-CM

## 2023-05-29 DIAGNOSIS — E1159 Type 2 diabetes mellitus with other circulatory complications: Secondary | ICD-10-CM | POA: Diagnosis not present

## 2023-05-29 DIAGNOSIS — F32A Depression, unspecified: Secondary | ICD-10-CM

## 2023-05-29 DIAGNOSIS — Z794 Long term (current) use of insulin: Secondary | ICD-10-CM | POA: Diagnosis not present

## 2023-05-29 MED ORDER — LISINOPRIL 30 MG PO TABS
30.0000 mg | ORAL_TABLET | Freq: Every day | ORAL | 0 refills | Status: DC
Start: 2023-05-29 — End: 2023-10-26

## 2023-05-29 MED ORDER — LANTUS SOLOSTAR 100 UNIT/ML ~~LOC~~ SOPN
22.0000 [IU] | PEN_INJECTOR | Freq: Every day | SUBCUTANEOUS | 0 refills | Status: DC
Start: 2023-05-29 — End: 2024-02-24

## 2023-05-29 NOTE — Progress Notes (Signed)
Subjective: JS:EGBTDV up insulin start, mood PCP: Raliegh Ip, DO [id number] Casey Sanders is a 62 y.o. female presenting to clinic today for:  1.  Uncontrolled type 2 diabetes and hypertension Patient was started on insulin last visit.  She reports that blood sugars have been measuring 150s and up to 200s and she is up to 20 units per day.  She denies any hypoglycemic episodes.  Lowest blood sugar has been 123.  Her blood pressure was noted be elevated last visit as well.  She has not been monitoring blood pressures at home but notes it is always elevated when she comes into the office.  2.  Anxiety depression Patient reported isolated behaviors last visit.  She was started back on mirtazapine 15 mg daily and reports that she has really done much better on the mirtazapine.  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  She is very pleased with how things are going.  Still only getting about 4 to 5 hours of sleep per night but does take a nap occasionally during the day and this seems to help   ROS: Per HPI  Allergies  Allergen Reactions   Codeine Hives, Nausea And Vomiting and Other (See Comments)    Other reaction(s): unknown   Empagliflozin     Caused blisters Other reaction(s): Not available   Metformin And Related     nausea   Niaspan [Niacin Er] Other (See Comments)    Increase blood glucose   Past Medical History:  Diagnosis Date   Allergic rhinitis    Anxiety and depression    Asthma    Benign essential HTN 01/04/2011   Chest pain    a. Myoview 9/05: no ischemia, no scar, EF 70%   Chest pain, unspecified 02/12/2011   Cystitis 02/08/2015   Diverticulosis    DM2 (diabetes mellitus, type 2) (HCC)    Extremity pain 01/04/2013   Fibromyalgia    Frequent UTI    GERD (gastroesophageal reflux disease)    Hiatal Hernia   HLD (hyperlipidemia)    HTN (hypertension)    Left rotator cuff tear    Low back pain 01/04/2013    Lumbar disc disease    Nephrolithiasis    Obesity    Palpitations    monitor 9/05: NSR, sinus tachy, PVCs   Pre-operative cardiovascular examination 02/12/2011   Rectal bleeding 08/22/2013   Sepsis secondary to UTI (HCC) 08/09/2017   uterine ca dx'd 1988   per pt    Current Outpatient Medications:    Accu-Chek Softclix Lancets lancets, Please use to test blood sugar 3 times daily as directed. DX: E11.9, Disp: 100 each, Rfl: 12   albuterol (VENTOLIN HFA) 108 (90 Base) MCG/ACT inhaler, INHALE 2 PUFFS BY MOUTH EVERY 6 HOURS AS NEEDED FOR WHEEZING AND FOR SHORTNESS OF BREATH, Disp: 9 g, Rfl: 0   aspirin 81 MG EC tablet, Take 81 mg by mouth daily., Disp: , Rfl:    atorvastatin (LIPITOR) 80 MG tablet, Take 1 tablet (80 mg total) by mouth daily., Disp: 30 tablet, Rfl: 0   Blood Glucose Monitoring Suppl DEVI, E11.69 Check BGs up to 4 times daily; May substitute to any manufacturer covered by patient's insurance., Disp: 1 each, Rfl: 0   ciclopirox (PENLAC) 8 % solution, Apply topically at bedtime. Apply over nail and surrounding skin. Apply daily over previous coat. After seven (7) days, may remove with alcohol and continue cycle., Disp:  6.6 mL, Rfl: 0   Continuous Glucose Receiver (FREESTYLE LIBRE READER) DEVI, 1 Units by Does not apply route daily. UAD to test BGs daily. Dx E11.69, Disp: 1 each, Rfl: 1   Continuous Glucose Sensor (FREESTYLE LIBRE 3 SENSOR) MISC, Place 1 sensor on the skin every 14 days. Use to check glucose continuously E11.69, Disp: 6 each, Rfl: 3   diclofenac sodium (VOLTAREN) 1 % GEL, Apply 2 g topically 4 (four) times daily., Disp: 100 g, Rfl: 1   fish oil-omega-3 fatty acids 1000 MG capsule, Take 2 g by mouth daily., Disp: , Rfl:    Glucose Blood (BLOOD GLUCOSE TEST STRIPS) STRP, E11.69 Check BGs up to 4 times daily; May substitute to any manufacturer covered by patient's insurance., Disp: 100 strip, Rfl: PRN   insulin glargine (LANTUS SOLOSTAR) 100 UNIT/ML Solostar Pen,  Inject 10-20 Units into the skin at bedtime., Disp: 15 mL, Rfl: 1   Insulin Pen Needle 32G X 6 MM MISC, UAD with insulin E11.9, Disp: 100 each, Rfl: 3   Lancet Device MISC, E11.69 Check BGs up to 4 times daily; May substitute to any manufacturer covered by patient's insurance., Disp: 1 each, Rfl: 0   Lancets Misc. MISC, E11.69 Check BGs up to 4 times daily; May substitute to any manufacturer covered by patient's insurance., Disp: 100 each, Rfl: PRN   linaclotide (LINZESS) 145 MCG CAPS capsule, 1 capsule at least 30 minutes before the first meal of the day on an empty stomach, Disp: , Rfl:    lisinopril (ZESTRIL) 20 MG tablet, Take 1 tablet (20 mg total) by mouth daily., Disp: 90 tablet, Rfl: 3   mirtazapine (REMERON) 15 MG tablet, Take 1 tablet (15 mg total) by mouth at bedtime., Disp: 90 tablet, Rfl: 3 Social History   Socioeconomic History   Marital status: Widowed    Spouse name: Not on file   Number of children: 2   Years of education: 33   Highest education level: 12th grade  Occupational History   Occupation: disabled  Tobacco Use   Smoking status: Never    Passive exposure: Never   Smokeless tobacco: Never  Vaping Use   Vaping status: Never Used  Substance and Sexual Activity   Alcohol use: No   Drug use: No   Sexual activity: Not Currently    Partners: Male  Other Topics Concern   Not on file  Social History Narrative   2 daughters and 4 grandchildren.   Husband died in 20-May-2018.   Social Determinants of Health   Financial Resource Strain: Low Risk  (10/27/2022)   Overall Financial Resource Strain (CARDIA)    Difficulty of Paying Living Expenses: Not very hard  Food Insecurity: No Food Insecurity (10/27/2022)   Hunger Vital Sign    Worried About Running Out of Food in the Last Year: Never true    Ran Out of Food in the Last Year: Never true  Transportation Needs: No Transportation Needs (10/27/2022)   PRAPARE - Administrator, Civil Service (Medical): No     Lack of Transportation (Non-Medical): No  Physical Activity: Inactive (10/27/2022)   Exercise Vital Sign    Days of Exercise per Week: 0 days    Minutes of Exercise per Session: 0 min  Stress: No Stress Concern Present (10/27/2022)   [employer] of Occupational Health - Occupational Stress Questionnaire    Feeling of Stress : Only a little  Social Connections: Moderately Integrated (10/27/2022)   Social Connection and Isolation  Panel [NHANES]    Frequency of Communication with Friends and Family: More than three times a week    Frequency of Social Gatherings with Friends and Family: Three times a week    Attends Religious Services: More than 4 times per year    Active Member of Clubs or Organizations: Yes    Attends Banker Meetings: More than 4 times per year    Marital Status: Widowed  Intimate Partner Violence: Not At Risk (10/27/2022)   Humiliation, Afraid, Rape, and Kick questionnaire    Fear of Current or Ex-Partner: No    Emotionally Abused: No    Physically Abused: No    Sexually Abused: No   Family History  Problem Relation Age of Onset   Coronary artery disease Mother    Coronary artery disease Father    Heart disease Brother    Heart disease Brother    Lung cancer Brother 35   Heart attack Brother 31       had a few heart attacks before   Heart disease Brother    Breast cancer Neg Hx     Objective: Office vital signs reviewed. BP (!) 183/91   Pulse 77   Temp 98.6 F (37 C)   Ht 5\' 1"  (1.549 m)   Wt 173 lb (78.5 kg)   SpO2 95%   BMI 32.69 kg/m   Physical Examination:  General: Awake, alert, well nourished, No acute distress HEENT: sclera white, MMM Cardio: regular rate and rhythm, S1S2 heard, no murmurs appreciated Pulm: clear to auscultation bilaterally, no wheezes, rhonchi or rales; normal work of breathing on room air Psych: Appears much happier today.     05/29/2023    3:48 PM 05/01/2023   10:57 AM 05/01/2023   10:39 AM  Depression  screen PHQ 2/9  Decreased Interest 0 1 0  Down, Depressed, Hopeless 0 1 0  PHQ - 2 Score 0 2 0  Altered sleeping 0 2   Tired, decreased energy 1 0   Change in appetite 0 0   Feeling bad or failure about yourself  1 1   Trouble concentrating 1 1   Moving slowly or fidgety/restless 0 0   Suicidal thoughts  0   PHQ-9 Score 3 6   Difficult doing work/chores Somewhat difficult        05/29/2023    3:48 PM 07/07/2022    3:35 PM 03/13/2022   10:43 AM 02/20/2022   11:12 AM  GAD 7 : Generalized Anxiety Score  Nervous, Anxious, on Edge 1 1 1  0  Control/stop worrying 0 0 0 0  Worry too much - different things 1 0 0 0  Trouble relaxing 1 0 1 1  Restless 1 0 0 1  Easily annoyed or irritable 1 0 0 0  Afraid - awful might happen 1 0 0 0  Total GAD 7 Score 6 1 2 2   Anxiety Difficulty Somewhat difficult Not difficult at all Somewhat difficult     Assessment/ Plan: 62 y.o. female   Type 2 diabetes mellitus with other specified complication, with long-term current use of insulin (HCC) - Plan: insulin glargine (LANTUS SOLOSTAR) 100 UNIT/ML Solostar Pen  Hypertension associated with diabetes (HCC) - Plan: lisinopril (ZESTRIL) 30 MG tablet  Anxiety  Depressive disorder  Sugar sounds like it is getting closer to what goal is.  I will advance her to 22 units nightly.  Continue monitor blood sugars with goal of less than 150 consistently  Blood pressure  was not at goal so I increased her to lisinopril 30.  I will recheck her in 2 months and if needed we will go up to 40 mg and/or add Norvasc  Continue mirtazapine at current doses this seems to be working well for her.   Raliegh Ip, DO [facility] [phone]

## 2023-05-29 NOTE — Patient Instructions (Addendum)
Go up to 22 units of Lantus.  Goal sugar is below 150 in the morning

## 2023-07-05 ENCOUNTER — Other Ambulatory Visit: Payer: Self-pay | Admitting: Family Medicine

## 2023-07-05 DIAGNOSIS — E1169 Type 2 diabetes mellitus with other specified complication: Secondary | ICD-10-CM

## 2023-07-08 ENCOUNTER — Telehealth: Payer: Self-pay | Admitting: Family Medicine

## 2023-07-09 NOTE — Telephone Encounter (Signed)
Unfortunately all of the basal insulins are the same tier This is for a 68 day supply (why she is being charged 2 copays at once) and the pharmacies can't break the boxes

## 2023-07-09 NOTE — Telephone Encounter (Signed)
Pt aware of Julie's feedback and voiced understanding.

## 2023-07-12 ENCOUNTER — Other Ambulatory Visit: Payer: Self-pay | Admitting: Family Medicine

## 2023-07-12 DIAGNOSIS — I152 Hypertension secondary to endocrine disorders: Secondary | ICD-10-CM

## 2023-07-23 ENCOUNTER — Telehealth: Payer: Self-pay | Admitting: Family Medicine

## 2023-07-23 DIAGNOSIS — Z794 Long term (current) use of insulin: Secondary | ICD-10-CM

## 2023-07-23 NOTE — Telephone Encounter (Signed)
Pt called stating that she has 3-4 more days left on her current freestyle libre 3 sensor and she called [facility] pharmacy to get refill but was told that they havent been able to get them in stock.  Needs advise on what to do.

## 2023-07-24 MED ORDER — FREESTYLE LIBRE 3 PLUS SENSOR MISC
6 refills | Status: DC
Start: 2023-07-24 — End: 2023-07-30

## 2023-07-24 NOTE — Telephone Encounter (Signed)
Patient aware and verbalized understanding. °

## 2023-07-24 NOTE — Telephone Encounter (Signed)
Please let patient know: Libre 3 plus sensors called in to [employer] (15 day wear vs 14 day) The libre 3 plus is not backordered & should be in stock (patient to use same app or reader as the libre 3) Patient can fingerstick until she is able to get new sensor Will work on PA if needed Would call [facility] before going to pick up

## 2023-07-29 ENCOUNTER — Encounter: Payer: Self-pay | Admitting: Family Medicine

## 2023-07-29 ENCOUNTER — Ambulatory Visit: Payer: Medicare HMO | Admitting: Family Medicine

## 2023-07-29 VITALS — BP 175/86 | HR 78 | Temp 98.8°F | Ht 61.0 in | Wt 177.2 lb

## 2023-07-29 DIAGNOSIS — I152 Hypertension secondary to endocrine disorders: Secondary | ICD-10-CM | POA: Diagnosis not present

## 2023-07-29 DIAGNOSIS — E1169 Type 2 diabetes mellitus with other specified complication: Secondary | ICD-10-CM

## 2023-07-29 DIAGNOSIS — E785 Hyperlipidemia, unspecified: Secondary | ICD-10-CM

## 2023-07-29 DIAGNOSIS — Z794 Long term (current) use of insulin: Secondary | ICD-10-CM

## 2023-07-29 DIAGNOSIS — E1159 Type 2 diabetes mellitus with other circulatory complications: Secondary | ICD-10-CM | POA: Diagnosis not present

## 2023-07-29 DIAGNOSIS — Z23 Encounter for immunization: Secondary | ICD-10-CM | POA: Diagnosis not present

## 2023-07-29 LAB — BAYER DCA HB A1C WAIVED: HB A1C (BAYER DCA - WAIVED): 6.7 % — ABNORMAL HIGH (ref 4.8–5.6)

## 2023-07-29 MED ORDER — AMLODIPINE BESYLATE 2.5 MG PO TABS
2.5000 mg | ORAL_TABLET | Freq: Every day | ORAL | 3 refills | Status: AC
Start: 2023-07-29 — End: ?

## 2023-07-29 NOTE — Progress Notes (Signed)
Subjective: CC:DM PCP: Raliegh Ip, DO ZOX:WRUEAVW Kleinert is a 62 y.o. female presenting to clinic today for:  1. Type 2 Diabetes with hypertension, hyperlipidemia:  She was using CGM but somehow she lost her freestyle libre meter.  She is also run out of sensors.  She reports that she has had a couple of hypoglycemic episodes into the 60s and 40s.  She has not fingerstick herself.  These woke her up from sleep from the alarms.  She does not report being symptomatic at that time. Diabetes Health Maintenance Due  Topic Date Due   FOOT EXAM  07/08/2023   OPHTHALMOLOGY EXAM  07/29/2023 (Originally 10/25/2020)   HEMOGLOBIN A1C  11/01/2023    Last A1c:  Lab Results  Component Value Date   HGBA1C 10.0 (H) 05/01/2023    ROS: Per HPI  Allergies  Allergen Reactions   Codeine Hives, Nausea And Vomiting and Other (See Comments)    Other reaction(s): unknown   Empagliflozin     Caused blisters Other reaction(s): Not available   Metformin And Related     nausea   Niaspan [Niacin Er] Other (See Comments)    Increase blood glucose   Past Medical History:  Diagnosis Date   Allergic rhinitis    Anxiety and depression    Asthma    Benign essential HTN 01/04/2011   Chest pain    a. Myoview 9/05: no ischemia, no scar, EF 70%   Chest pain, unspecified 02/12/2011   Cystitis 02/08/2015   Diverticulosis    DM2 (diabetes mellitus, type 2) (HCC)    Extremity pain 01/04/2013   Fibromyalgia    Frequent UTI    GERD (gastroesophageal reflux disease)    Hiatal Hernia   HLD (hyperlipidemia)    HTN (hypertension)    Left rotator cuff tear    Low back pain 01/04/2013   Lumbar disc disease    Nephrolithiasis    Obesity    Palpitations    monitor 9/05: NSR, sinus tachy, PVCs   Pre-operative cardiovascular examination 02/12/2011   Rectal bleeding 08/22/2013   Sepsis secondary to UTI (HCC) 08/09/2017   uterine ca dx'd 1988   per pt    Current Outpatient Medications:    Accu-Chek  Softclix Lancets lancets, Please use to test blood sugar 3 times daily as directed. DX: E11.9, Disp: 100 each, Rfl: 12   albuterol (VENTOLIN HFA) 108 (90 Base) MCG/ACT inhaler, INHALE 2 PUFFS BY MOUTH EVERY 6 HOURS AS NEEDED FOR WHEEZING AND FOR SHORTNESS OF BREATH, Disp: 9 g, Rfl: 0   aspirin 81 MG EC tablet, Take 81 mg by mouth daily., Disp: , Rfl:    atorvastatin (LIPITOR) 80 MG tablet, Take 1 tablet by mouth once daily, Disp: 30 tablet, Rfl: 4   Blood Glucose Monitoring Suppl DEVI, E11.69 Check BGs up to 4 times daily; May substitute to any manufacturer covered by patient's insurance., Disp: 1 each, Rfl: 0   ciclopirox (PENLAC) 8 % solution, Apply topically at bedtime. Apply over nail and surrounding skin. Apply daily over previous coat. After seven (7) days, may remove with alcohol and continue cycle., Disp: 6.6 mL, Rfl: 0   Continuous Glucose Receiver (FREESTYLE LIBRE READER) DEVI, 1 Units by Does not apply route daily. UAD to test BGs daily. Dx E11.69, Disp: 1 each, Rfl: 1   Continuous Glucose Sensor (FREESTYLE LIBRE 3 PLUS SENSOR) MISC, Apply sensor to back of arm every 15 days to monitor glucose continuously. DX: E11.65, Disp: 2 each, Rfl:  6   diclofenac sodium (VOLTAREN) 1 % GEL, Apply 2 g topically 4 (four) times daily., Disp: 100 g, Rfl: 1   fish oil-omega-3 fatty acids 1000 MG capsule, Take 2 g by mouth daily., Disp: , Rfl:    Glucose Blood (BLOOD GLUCOSE TEST STRIPS) STRP, E11.69 Check BGs up to 4 times daily; May substitute to any manufacturer covered by patient's insurance., Disp: 100 strip, Rfl: PRN   insulin glargine (LANTUS SOLOSTAR) 100 UNIT/ML Solostar Pen, Inject 22 Units into the skin at bedtime., Disp: 30 mL, Rfl: 0   Insulin Pen Needle 32G X 6 MM MISC, UAD with insulin E11.9, Disp: 100 each, Rfl: 3   Lancet Device MISC, E11.69 Check BGs up to 4 times daily; May substitute to any manufacturer covered by patient's insurance., Disp: 1 each, Rfl: 0   Lancets Misc. MISC, E11.69  Check BGs up to 4 times daily; May substitute to any manufacturer covered by patient's insurance., Disp: 100 each, Rfl: PRN   linaclotide (LINZESS) 145 MCG CAPS capsule, 1 capsule at least 30 minutes before the first meal of the day on an empty stomach, Disp: , Rfl:    lisinopril (ZESTRIL) 30 MG tablet, Take 1 tablet (30 mg total) by mouth daily. **Dose change, Disp: 90 tablet, Rfl: 0   mirtazapine (REMERON) 15 MG tablet, Take 1 tablet (15 mg total) by mouth at bedtime., Disp: 90 tablet, Rfl: 3 Social History   Socioeconomic History   Marital status: Widowed    Spouse name: Not on file   Number of children: 2   Years of education: 48   Highest education level: 12th grade  Occupational History   Occupation: disabled  Tobacco Use   Smoking status: Never    Passive exposure: Never   Smokeless tobacco: Never  Vaping Use   Vaping status: Never Used  Substance and Sexual Activity   Alcohol use: No   Drug use: No   Sexual activity: Not Currently    Partners: Male  Other Topics Concern   Not on file  Social History Narrative   2 daughters and 4 grandchildren.   Husband died in May 13, 2018.   Social Determinants of Health   Financial Resource Strain: Low Risk  (10/27/2022)   Overall Financial Resource Strain (CARDIA)    Difficulty of Paying Living Expenses: Not very hard  Food Insecurity: No Food Insecurity (10/27/2022)   Hunger Vital Sign    Worried About Running Out of Food in the Last Year: Never true    Ran Out of Food in the Last Year: Never true  Transportation Needs: No Transportation Needs (10/27/2022)   PRAPARE - Administrator, Civil Service (Medical): No    Lack of Transportation (Non-Medical): No  Physical Activity: Inactive (10/27/2022)   Exercise Vital Sign    Days of Exercise per Week: 0 days    Minutes of Exercise per Session: 0 min  Stress: No Stress Concern Present (10/27/2022)   [employer] of Occupational Health - Occupational Stress Questionnaire     Feeling of Stress : Only a little  Social Connections: Moderately Integrated (10/27/2022)   Social Connection and Isolation Panel [NHANES]    Frequency of Communication with Friends and Family: More than three times a week    Frequency of Social Gatherings with Friends and Family: Three times a week    Attends Religious Services: More than 4 times per year    Active Member of Clubs or Organizations: Yes    Attends Club  or Organization Meetings: More than 4 times per year    Marital Status: Widowed  Intimate Partner Violence: Not At Risk (10/27/2022)   Humiliation, Afraid, Rape, and Kick questionnaire    Fear of Current or Ex-Partner: No    Emotionally Abused: No    Physically Abused: No    Sexually Abused: No   Family History  Problem Relation Age of Onset   Coronary artery disease Mother    Coronary artery disease Father    Heart disease Brother    Heart disease Brother    Lung cancer Brother 41   Heart attack Brother 55       had a few heart attacks before   Heart disease Brother    Breast cancer Neg Hx     Objective: Office vital signs reviewed. BP (!) 175/86   Pulse 78   Temp 98.8 F (37.1 C)   Ht 5\' 1"  (1.549 m)   Wt 177 lb 3.2 oz (80.4 kg)   SpO2 95%   BMI 33.48 kg/m   Physical Examination:  General: Awake, alert, well nourished, No acute distress HEENT: sclera white, MMM Cardio: regular rate and rhythm, S1S2 heard, no murmurs appreciated Pulm: clear to auscultation bilaterally, no wheezes, rhonchi or rales; normal work of breathing on room air   Diabetic Foot Exam - Simple   No data filed     Assessment/ Plan: 62 y.o. female   Type 2 diabetes mellitus with other specified complication, with long-term current use of insulin (HCC) - Plan: Bayer DCA Hb A1c Waived  Hyperlipidemia associated with type 2 diabetes mellitus (HCC)  Encounter for immunization - Plan: Flu vaccine trivalent PF, 6mos and older(Flulaval,Afluria,Fluarix,Fluzone), CANCELED: Flu Vaccine  Trivalent High Dose (Fluad)  Hypertension associated with diabetes (HCC) - Plan: amLODipine (NORVASC) 2.5 MG tablet   Sugar controlled with A1c of 6.7 today.  I am concerned about these intermittent hypoglycemic episode she reports but I do wonder if perhaps this is due to the device being pressed on while she sleeps.  I encouraged her to fingerstick check if she ever has hypoglycemic episodes going forward.  I also gave her a sample of the [place] 3 pluses and encouraged her to try and get her prescription from the pharmacy.  This is already been sent in for her previously to [facility]  Continue statin  Flu shot administered  Blood pressure not controlled despite recheck.  Start Norvasc 2.5 mg daily.  Continue lisinopril  Raliegh Ip, DO [facility] [phone]

## 2023-07-30 ENCOUNTER — Telehealth: Payer: Self-pay | Admitting: Pharmacist

## 2023-07-30 DIAGNOSIS — Z794 Long term (current) use of insulin: Secondary | ICD-10-CM

## 2023-07-30 MED ORDER — FREESTYLE LIBRE 3 PLUS SENSOR MISC: 6 refills | Status: DC

## 2023-07-30 NOTE — Telephone Encounter (Signed)
Libre 3 plus sensors called in [place] completed  pending

## 2023-07-30 NOTE — Telephone Encounter (Signed)
CALLED PATIENT TO MAKE HER AWARE OF COVERAGE FOUND READER

## 2023-07-30 NOTE — Addendum Note (Signed)
Addended by: Vanice Sarah D on: 07/30/2023 11:03 AM   Modules accepted: Orders

## 2023-08-28 ENCOUNTER — Telehealth: Payer: Self-pay | Admitting: Family Medicine

## 2023-08-28 NOTE — Telephone Encounter (Signed)
Spoke with patient regarding this issue. Appt with [place] has been cancelled and pt is aware. Pt already seeing an eye specialist.    Copied from CRM [id number]. Topic: Clinical - Medical Advice >> Aug 28, 2023  3:13 PM Herbert Seta B wrote: Reason for CRM: Patient has diabetic eye exam scheduled for next week, already seeing an eye specialist that day in another location...would like to know if both appointments are needed.

## 2023-09-03 ENCOUNTER — Ambulatory Visit: Payer: Medicare HMO

## 2023-09-09 DIAGNOSIS — E113413 Type 2 diabetes mellitus with severe nonproliferative diabetic retinopathy with macular edema, bilateral: Secondary | ICD-10-CM | POA: Diagnosis not present

## 2023-09-09 DIAGNOSIS — H43811 Vitreous degeneration, right eye: Secondary | ICD-10-CM | POA: Diagnosis not present

## 2023-09-09 DIAGNOSIS — H3563 Retinal hemorrhage, bilateral: Secondary | ICD-10-CM | POA: Diagnosis not present

## 2023-09-09 DIAGNOSIS — H43822 Vitreomacular adhesion, left eye: Secondary | ICD-10-CM | POA: Diagnosis not present

## 2023-09-09 DIAGNOSIS — H35033 Hypertensive retinopathy, bilateral: Secondary | ICD-10-CM | POA: Diagnosis not present

## 2023-09-23 ENCOUNTER — Other Ambulatory Visit: Payer: Self-pay | Admitting: Family Medicine

## 2023-09-23 DIAGNOSIS — Z1231 Encounter for screening mammogram for malignant neoplasm of breast: Secondary | ICD-10-CM

## 2023-09-28 ENCOUNTER — Ambulatory Visit
Admission: RE | Admit: 2023-09-28 | Discharge: 2023-09-28 | Disposition: A | Discharge status: Home / Self Care | Payer: Medicare HMO | Source: Ambulatory Visit | Attending: Family Medicine

## 2023-09-28 DIAGNOSIS — Z1231 Encounter for screening mammogram for malignant neoplasm of breast: Secondary | ICD-10-CM | POA: Diagnosis not present

## 2023-10-09 ENCOUNTER — Other Ambulatory Visit: Payer: Self-pay | Admitting: Family Medicine

## 2023-10-09 DIAGNOSIS — E1169 Type 2 diabetes mellitus with other specified complication: Secondary | ICD-10-CM

## 2023-10-26 ENCOUNTER — Other Ambulatory Visit: Payer: Self-pay | Admitting: Family Medicine

## 2023-10-26 DIAGNOSIS — I152 Hypertension secondary to endocrine disorders: Secondary | ICD-10-CM

## 2023-10-30 ENCOUNTER — Ambulatory Visit (INDEPENDENT_AMBULATORY_CARE_PROVIDER_SITE_OTHER): Payer: Medicare HMO

## 2023-10-30 VITALS — Ht 61.0 in | Wt 177.0 lb

## 2023-10-30 DIAGNOSIS — Z0000 Encounter for general adult medical examination without abnormal findings: Secondary | ICD-10-CM

## 2023-10-30 NOTE — Progress Notes (Signed)
 Subjective:   Casey Sanders is a 63 y.o. female who presents for Medicare Annual (Subsequent) preventive examination.  Visit Complete: Virtual I connected with  Heron Sprague on 10/30/23 by a audio enabled telemedicine application and verified that I am speaking with the correct person using two identifiers.  Patient Location: Home  Provider Location: Home Office  This patient declined Interactive audio and video telecommunications. Therefore the visit was completed with audio only.  I discussed the limitations of evaluation and management by telemedicine. The patient expressed understanding and agreed to proceed.  Vital Signs: Because this visit was a virtual/telehealth visit, some criteria may be missing or patient reported. Any vitals not documented were not able to be obtained and vitals that have been documented are patient reported.  Cardiac Risk Factors include: dyslipidemia;diabetes mellitus;hypertension;sedentary lifestyle     Objective:    Today's Vitals   10/30/23 1045  Weight: 177 lb (80.3 kg)  Height: 5' 1 (1.549 m)   Body mass index is 33.44 kg/m.     10/30/2023   11:10 AM 10/27/2022   11:04 AM 06/11/2022    1:59 PM 06/09/2022    3:06 PM 10/23/2021   10:00 AM 08/10/2017    1:25 AM 08/09/2017    6:23 PM  Advanced Directives  Does Patient Have a Medical Advance Directive? No No Yes Yes No No No  Type of Best Boy of [place];Living will Healthcare Power of [place];Living will     Does patient want to make changes to medical advance directive?   No - Guardian declined No - Patient declined     Copy of Healthcare Power of Attorney in Chart?   No - copy requested No - copy requested     Would patient like information on creating a medical advance directive? Yes (MAU/Ambulatory/Procedural Areas - Information given) No - Patient declined   No - Patient declined No - Patient declined No - Patient declined    Current Medications  (verified) Outpatient Encounter Medications as of 10/30/2023  Medication Sig   Accu-Chek Softclix Lancets lancets Please use to test blood sugar 3 times daily as directed. DX: E11.9   albuterol  (VENTOLIN  HFA) 108 (90 Base) MCG/ACT inhaler INHALE 2 PUFFS BY MOUTH EVERY 6 HOURS AS NEEDED FOR WHEEZING AND FOR SHORTNESS OF BREATH   amLODipine  (NORVASC ) 2.5 MG tablet Take 1 tablet (2.5 mg total) by mouth daily. Add to lisinopril  for blood pressure   aspirin  81 MG EC tablet Take 81 mg by mouth daily.   atorvastatin  (LIPITOR ) 80 MG tablet Take 1 tablet by mouth once daily   Blood Glucose Monitoring Suppl DEVI E11.69 Check BGs up to 4 times daily; May substitute to any manufacturer covered by patient's insurance.   ciclopirox  (PENLAC ) 8 % solution Apply topically at bedtime. Apply over nail and surrounding skin. Apply daily over previous coat. After seven (7) days, may remove with alcohol and continue cycle.   Continuous Glucose Receiver (FREESTYLE LIBRE READER) DEVI 1 Units by Does not apply route daily. UAD to test BGs daily. Dx E11.69   Continuous Glucose Sensor (FREESTYLE LIBRE 3 PLUS SENSOR) MISC Apply sensor to back of arm every 15 days to monitor glucose continuously. DX: E11.65   diclofenac  sodium (VOLTAREN ) 1 % GEL Apply 2 g topically 4 (four) times daily.   fish oil-omega-3 fatty acids 1000 MG capsule Take 2 g by mouth daily.   Glucose Blood (BLOOD GLUCOSE TEST STRIPS) STRP E11.69 Check BGs up to 4  times daily; May substitute to any manufacturer covered by patient's insurance.   insulin  glargine (LANTUS  SOLOSTAR) 100 UNIT/ML Solostar Pen Inject 22 Units into the skin at bedtime.   Insulin  Pen Needle 32G X 6 MM MISC UAD with insulin  E11.9   Lancet Device MISC E11.69 Check BGs up to 4 times daily; May substitute to any manufacturer covered by patient's insurance.   Lancets Misc. MISC E11.69 Check BGs up to 4 times daily; May substitute to any manufacturer covered by patient's insurance.    linaclotide (LINZESS) 145 MCG CAPS capsule 1 capsule at least 30 minutes before the first meal of the day on an empty stomach   lisinopril  (ZESTRIL ) 30 MG tablet Take 1 tablet by mouth once daily   mirtazapine  (REMERON ) 15 MG tablet Take 1 tablet (15 mg total) by mouth at bedtime.   No facility-administered encounter medications on file as of 10/30/2023.    Allergies (verified) Codeine, Empagliflozin , Metformin and related, and Niaspan [niacin er (antihyperlipidemic)]   History: Past Medical History:  Diagnosis Date   Allergic rhinitis    Anxiety and depression    Asthma    Benign essential HTN 01/04/2011   Chest pain    a. Myoview  9/05: no ischemia, no scar, EF 70%   Chest pain, unspecified 02/12/2011   Cystitis 02/08/2015   Diverticulosis    DM2 (diabetes mellitus, type 2) (HCC)    Extremity pain 01/04/2013   Fibromyalgia    Frequent UTI    GERD (gastroesophageal reflux disease)    Hiatal Hernia   HLD (hyperlipidemia)    HTN (hypertension)    Left rotator cuff tear    Low back pain 01/04/2013   Lumbar disc disease    Nephrolithiasis    Obesity    Palpitations    monitor 9/05: NSR, sinus tachy, PVCs   Pre-operative cardiovascular examination 02/12/2011   Rectal bleeding 08/22/2013   Sepsis secondary to UTI (HCC) 08/09/2017   uterine ca dx'd 1988   per pt   Past Surgical History:  Procedure Laterality Date   ABDOMINAL HYSTERECTOMY     CESAREAN SECTION     ESOPHAGEAL MANOMETRY N/A 06/19/2021   Procedure: ESOPHAGEAL MANOMETRY (EM);  Surgeon: Dianna Specking, MD;  Location: [facility];  Service: Endoscopy;  Laterality: N/A;   FOOT SURGERY     right wrist surgery     ROTATOR CUFF REPAIR Left 2009   tonsillectomy     TOTAL ABDOMINAL HYSTERECTOMY W/ BILATERAL SALPINGOOPHORECTOMY     Family History  Problem Relation Age of Onset   Coronary artery disease Mother    Coronary artery disease Father    Heart disease Brother    Heart disease Brother    Lung cancer Brother  74   Heart attack Brother 66       had a few heart attacks before   Heart disease Brother    Breast cancer Neg Hx    Social History   Socioeconomic History   Marital status: Widowed    Spouse name: Not on file   Number of children: 2   Years of education: 60   Highest education level: 12th grade  Occupational History   Occupation: disabled  Tobacco Use   Smoking status: Never    Passive exposure: Never   Smokeless tobacco: Never  Vaping Use   Vaping status: Never Used  Substance and Sexual Activity   Alcohol use: No   Drug use: No   Sexual activity: Not Currently    Partners: Male  Other Topics Concern   Not on file  Social History Narrative   2 daughters and 4 grandchildren.   Husband died in 05-02-2018.   Social Drivers of Corporate Investment Banker Strain: Low Risk  (10/30/2023)   Overall Financial Resource Strain (CARDIA)    Difficulty of Paying Living Expenses: Not hard at all  Food Insecurity: No Food Insecurity (10/30/2023)   Hunger Vital Sign    Worried About Running Out of Food in the Last Year: Never true    Ran Out of Food in the Last Year: Never true  Transportation Needs: No Transportation Needs (10/30/2023)   PRAPARE - Administrator, Civil Service (Medical): No    Lack of Transportation (Non-Medical): No  Physical Activity: Inactive (10/30/2023)   Exercise Vital Sign    Days of Exercise per Week: 0 days    Minutes of Exercise per Session: 0 min  Stress: No Stress Concern Present (10/30/2023)   [employer] of Occupational Health - Occupational Stress Questionnaire    Feeling of Stress : Not at all  Social Connections: Moderately Integrated (10/30/2023)   Social Connection and Isolation Panel [NHANES]    Frequency of Communication with Friends and Family: More than three times a week    Frequency of Social Gatherings with Friends and Family: Three times a week    Attends Religious Services: More than 4 times per year    Active Member of  Clubs or Organizations: Yes    Attends Banker Meetings: 1 to 4 times per year    Marital Status: Widowed    Tobacco Counseling Counseling given: Not Answered   Clinical Intake:  Pre-visit preparation completed: Yes  Pain : No/denies pain     Diabetes: Yes CBG done?: No Did pt. bring in CBG monitor from home?: No  How often do you need to have someone help you when you read instructions, pamphlets, or other written materials from your doctor or pharmacy?: 1 - Never  Interpreter Needed?: No  Information entered by :: Charmaine Bloodgood LPN   Activities of Daily Living    10/30/2023   11:04 AM  In your present state of health, do you have any difficulty performing the following activities:  Hearing? 0  Vision? 0  Difficulty concentrating or making decisions? 0  Walking or climbing stairs? 0  Dressing or bathing? 0  Doing errands, shopping? 0  Preparing Food and eating ? N  Using the Toilet? N  In the past six months, have you accidently leaked urine? N  Do you have problems with loss of bowel control? N  Managing your Medications? N  Managing your Finances? N  Housekeeping or managing your Housekeeping? N    Patient Care Team: Jolinda Norene HERO, DO as PCP - General (Family Medicine) Billee Mliss BIRCH, [facility] (Pharmacist) Ramonita Suzen CROME, RN as [facility] Care Management Elicia Claw, MD as Consulting Physician (Gastroenterology) [facility] Nonah, Camellia ORN, MD as Consulting Physician (Optometry)  Indicate any recent Medical Services you may have received from other than [facility] providers in the past year (date may be approximate).     Assessment:   This is a routine wellness examination for [place].  Hearing/Vision screen Hearing Screening - Comments:: Denies hearing difficulties   Vision Screening - Comments:: Wears rx glasses - up to date with routine eye exams with Dr. Nonah and [facility]      Goals Addressed   None  Depression Screen    10/30/2023   11:11 AM 05/29/2023    3:48 PM 05/01/2023   10:57 AM 05/01/2023   10:39 AM 10/27/2022   11:06 AM 07/14/2022   11:53 AM 07/07/2022    3:35 PM  PHQ 2/9 Scores  PHQ - 2 Score 0 0 2 0 0 2 2  PHQ- 9 Score  3 6   5 5     Fall Risk    10/30/2023   11:03 AM 05/29/2023    2:42 PM 05/01/2023   10:39 AM 10/27/2022   11:04 AM 07/07/2022    3:35 PM  Fall Risk   Falls in the past year? 0 0 0 0 0  Number falls in past yr: 0 0  0   Injury with Fall? 0 0  0   Risk for fall due to : No Fall Risks No Fall Risks  No Fall Risks   Follow up Falls prevention discussed;Education provided;Falls evaluation completed Education provided  Falls evaluation completed;Education provided;Falls prevention discussed     MEDICARE RISK AT HOME: Medicare Risk at Home Any stairs in or around the home?: No If so, are there any without handrails?: No Home free of loose throw rugs in walkways, pet beds, electrical cords, etc?: Yes Adequate lighting in your home to reduce risk of falls?: Yes Life alert?: No Use of a cane, walker or w/c?: No Grab bars in the bathroom?: Yes Shower chair or bench in shower?: Yes Elevated toilet seat or a handicapped toilet?: Yes  TIMED UP AND GO:  Was the test performed?  No    Cognitive Function:        10/30/2023   11:10 AM 10/27/2022   11:05 AM 10/23/2021   10:05 AM  6CIT Screen  What Year? 0 points 0 points 0 points  What month? 0 points 0 points 0 points  What time? 0 points 0 points 0 points  Count back from 20 0 points 0 points 0 points  Months in reverse 0 points 0 points 0 points  Repeat phrase 0 points 0 points 4 points  Total Score 0 points 0 points 4 points    Immunizations Immunization History  Administered Date(s) Administered   Influenza Whole 10/20/2006   Influenza, Seasonal, Injecte, Preservative Fre 07/29/2023   Influenza,inj,Quad PF,6+ Mos 08/22/2013, 09/07/2014, 07/15/2016, 08/20/2017, 07/05/2019,  07/07/2022   Moderna Sars-Covid-2 Vaccination 03/28/2020, 04/26/2020   Pneumococcal Conjugate-13 09/07/2014   Pneumococcal Polysaccharide-23 07/15/2016   Td 10/20/2002   Td (Adult), 2 Lf Tetanus Toxid, Preservative Free 10/20/2002   Tdap 05/01/2023    TDAP status: Up to date  Flu Vaccine status: Up to date  Pneumococcal vaccine status: Up to date  Covid-19 vaccine status: Information provided on how to obtain vaccines.   Qualifies for Shingles Vaccine? Yes   Zostavax completed No   Shingrix  Completed?: No.    Education has been provided regarding the importance of this vaccine. Patient has been advised to call insurance company to determine out of pocket expense if they have not yet received this vaccine. Advised may also receive vaccine at local pharmacy or Health Dept. Verbalized acceptance and understanding.  Screening Tests Health Maintenance  Topic Date Due   Zoster Vaccines- Shingrix  (1 of 2) Never done   COVID-19 Vaccine (3 - Moderna risk series) 05/24/2020   OPHTHALMOLOGY EXAM  10/25/2020   FOOT EXAM  07/08/2023   HEMOGLOBIN A1C  01/27/2024   Pneumococcal Vaccine 6-74 Years old (3 of 3 - PPSV23 or PCV20)  03/10/2024   Diabetic kidney evaluation - eGFR measurement  04/30/2024   Diabetic kidney evaluation - Urine ACR  04/30/2024   Medicare Annual Wellness (AWV)  10/29/2024   Fecal DNA (Cologuard)  07/25/2025   MAMMOGRAM  09/27/2025   DTaP/Tdap/Td (4 - Td or Tdap) 04/30/2033   INFLUENZA VACCINE  Completed   Hepatitis C Screening  Completed   HIV Screening  Completed   HPV VACCINES  Aged Out   Colonoscopy  Discontinued    Health Maintenance  Health Maintenance Due  Topic Date Due   Zoster Vaccines- Shingrix  (1 of 2) Never done   COVID-19 Vaccine (3 - Moderna risk series) 05/24/2020   OPHTHALMOLOGY EXAM  10/25/2020   FOOT EXAM  07/08/2023    Colorectal cancer screening: Type of screening: Cologuard. Completed 07/25/22. Repeat every 3 years  Mammogram status:  Completed 09/28/23. Repeat every year  Lung Cancer Screening: (Low Dose CT Chest recommended if Age 46-80 years, 20 pack-year currently smoking OR have quit w/in 15years.) does not qualify.   Lung Cancer Screening Referral: n/a  Additional Screening:  Hepatitis C Screening: does qualify; Completed 05/23/16  Vision Screening: Recommended annual ophthalmology exams for early detection of glaucoma and other disorders of the eye. Is the patient up to date with their annual eye exam?  Yes  Who is the provider or what is the name of the office in which the patient attends annual eye exams? [facility] If pt is not established with a provider, would they like to be referred to a provider to establish care? No .   Dental Screening: Recommended annual dental exams for proper oral hygiene  Diabetic Foot Exam: Diabetic Foot Exam: Overdue, Pt has been advised about the importance in completing this exam. Pt is scheduled for diabetic foot exam on at next office visit.  Community Resource Referral / Chronic Care Management: CRR required this visit?  No   CCM required this visit?  No     Plan:     I have personally reviewed and noted the following in the patient's chart:   Medical and social history Use of alcohol, tobacco or illicit drugs  Current medications and supplements including opioid prescriptions. Patient is not currently taking opioid prescriptions. Functional ability and status Nutritional status Physical activity Advanced directives List of other physicians Hospitalizations, surgeries, and ER visits in previous 12 months Vitals Screenings to include cognitive, depression, and falls Referrals and appointments  In addition, I have reviewed and discussed with patient certain preventive protocols, quality metrics, and best practice recommendations. A written personalized care plan for preventive services as well as general preventive health recommendations were provided to  patient.     Lavelle Pfeiffer Pax, [place]   8/89/7974   After Visit Summary: (Mail) Due to this being a telephonic visit, the after visit summary with patients personalized plan was offered to patient via mail   Nurse Notes: No concerns at this time

## 2023-10-30 NOTE — Patient Instructions (Signed)
 Ms. Casey Sanders , Thank you for taking time to come for your Medicare Wellness Visit. I appreciate your ongoing commitment to your health goals. Please review the following plan we discussed and let me know if I can assist you in the future.   Referrals/Orders/Follow-Ups/Clinician Recommendations: Aim for 30 minutes of exercise or brisk walking, 6-8 glasses of water, and 5 servings of fruits and vegetables each day.  This is a list of the screening recommended for you and due dates:  Health Maintenance  Topic Date Due   Zoster (Shingles) Vaccine (1 of 2) Never done   COVID-19 Vaccine (3 - Moderna risk series) 05/24/2020   Eye exam for diabetics  10/25/2020   Complete foot exam   07/08/2023   Hemoglobin A1C  01/27/2024   Pneumococcal Vaccination (3 of 3 - PPSV23 or PCV20) 03/10/2024   Yearly kidney function blood test for diabetes  04/30/2024   Yearly kidney health urinalysis for diabetes  04/30/2024   Medicare Annual Wellness Visit  10/29/2024   Cologuard (Stool DNA test)  07/25/2025   Mammogram  09/27/2025   DTaP/Tdap/Td vaccine (4 - Td or Tdap) 04/30/2033   Flu Shot  Completed   Hepatitis C Screening  Completed   HIV Screening  Completed   HPV Vaccine  Aged Out   Colon Cancer Screening  Discontinued    Advanced directives: (ACP Link)Information on Advanced Care Planning can be found at [place]  Secretary of State Advance Health Care Directives Advance Health Care Directives ([email or web address]   Next Medicare Annual Wellness Visit scheduled for next year: Yes

## 2023-12-02 DIAGNOSIS — H35033 Hypertensive retinopathy, bilateral: Secondary | ICD-10-CM | POA: Diagnosis not present

## 2023-12-02 DIAGNOSIS — H43811 Vitreous degeneration, right eye: Secondary | ICD-10-CM | POA: Diagnosis not present

## 2023-12-02 DIAGNOSIS — H43822 Vitreomacular adhesion, left eye: Secondary | ICD-10-CM | POA: Diagnosis not present

## 2023-12-02 DIAGNOSIS — E113413 Type 2 diabetes mellitus with severe nonproliferative diabetic retinopathy with macular edema, bilateral: Secondary | ICD-10-CM | POA: Diagnosis not present

## 2023-12-02 DIAGNOSIS — H3563 Retinal hemorrhage, bilateral: Secondary | ICD-10-CM | POA: Diagnosis not present

## 2023-12-02 LAB — HM DIABETES EYE EXAM

## 2024-01-13 ENCOUNTER — Ambulatory Visit: Payer: Self-pay | Admitting: *Deleted

## 2024-01-13 NOTE — Patient Outreach (Signed)
 Care Coordination   Follow Up Visit Note   01/13/2024 Name: Casey Sanders MRN: [id number] DOB: [birth year]  Casey Sanders is a 63 y.o. year old female who sees Raliegh Ip, DO for primary care. I spoke with  [name] by phone today.  What matters to the patients health and wellness today?   "I am doing very well" She reports she is staying busy with her crafts to keep herself occupied Her Diabetes is controlled with A1c at 6.7 She no longer has constipation as her Linzess is now at better dose She denies any new worsening symptoms She has not been to the hospital or ED since September 2024 She voiced understanding of case closure with availability of further care management services with re assigned RN CM    Goals Addressed               This Visit's Progress     Patient Stated     COMPLETED: Manage Diabetes (THN) (pt-stated)   On track     Care Coordination Interventions: Provided education to patient about basic DM disease process Discussed plans with patient for ongoing care management follow up and provided patient with direct contact information for care management team Confirmed management of diabetes varies Confirmed she is still using her Accu chek (not a continuous blood glucose monitor)  HgA1c improved to 6.7 was 10 Goal met      COMPLETED: Manage hypertension (THN ) (pt-stated)        Care Coordination Interventions: Evaluation of current treatment plan related to hypertension self management and patient's adherence to plan as established by provider Discussed plans with patient for ongoing care management follow up and provided patient with direct contact information for care management team Advised patient, providing education and rationale, to monitor blood pressure daily and record, calling PCP for findings outside established parameters Assessed social determinant of health barriers Encouraged to take her medicines as ordered 01/13/24 reports she  monitors at home and at home she is managed "well" Goal met       COMPLETED: manage memory concerns [facility]) (pt-stated)         Care Coordination Interventions: Discussed plans with patient for ongoing care management follow up and provided patient with direct contact information for care management team Encouragement provided with her use of writing down things to remember in her notebook. Also suggested a tape recorder .   Reports her memory is maintaining especially when she keeps herself occupied Goal met      COMPLETED: Patient will manage bowel elimination at home [facility]) (pt-stated)   On track     Care Coordination Interventions: Evaluation of current treatment plan related to bowel management and patient's adherence to plan as established by provider Reviewed medications with patient and discussed Linzess & Omeprazole  Discussed plans with patient for ongoing care management follow up and provided patient with direct contact information for care management team Confirmed her Linzess dose was increased and now she is having regular stools        SDOH assessments and interventions completed:  Yes  SDOH Interventions Today    Flowsheet Row Most Recent Value  SDOH Interventions   Food Insecurity Interventions Intervention Not Indicated  Housing Interventions Intervention Not Indicated  Transportation Interventions Intervention Not Indicated  Utilities Interventions Intervention Not Indicated  Depression Interventions/Treatment  Currently on Treatment  Social Connections Interventions Intervention Not Indicated  Health Literacy Interventions Intervention Not Indicated        Care  Coordination Interventions:  Yes, provided   Follow up plan: No further intervention required.   Encounter Outcome:  Patient Visit Completed   Cala Bradford L. [name], RN, BSN, CCM [place]  [facility], [facility] RN Care Manager Direct Dial: [phone]  Fax:  [phone]

## 2024-01-13 NOTE — Patient Instructions (Signed)
 Visit Information  Thank you for taking time to visit with me today. Please don't hesitate to contact me if I can be of assistance to you.   Following are the goals we discussed today:   Goals Addressed               This Visit's Progress     Patient Stated     COMPLETED: Manage Diabetes (THN) (pt-stated)   On track     Care Coordination Interventions: Provided education to patient about basic DM disease process Discussed plans with patient for ongoing care management follow up and provided patient with direct contact information for care management team Confirmed management of diabetes varies Confirmed she is still using her Accu chek (not a continuous blood glucose monitor)  HgA1c improved to 6.7 was 10 Goal met      COMPLETED: Manage hypertension (THN ) (pt-stated)        Care Coordination Interventions: Evaluation of current treatment plan related to hypertension self management and patient's adherence to plan as established by provider Discussed plans with patient for ongoing care management follow up and provided patient with direct contact information for care management team Advised patient, providing education and rationale, to monitor blood pressure daily and record, calling PCP for findings outside established parameters Assessed social determinant of health barriers Encouraged to take her medicines as ordered 01/13/24 reports she monitors at home and at home she is managed "well" Goal met       COMPLETED: manage memory concerns [facility]) (pt-stated)         Care Coordination Interventions: Discussed plans with patient for ongoing care management follow up and provided patient with direct contact information for care management team Encouragement provided with her use of writing down things to remember in her notebook. Also suggested a tape recorder .   Reports her memory is maintaining especially when she keeps herself occupied Goal met      COMPLETED: Patient will  manage bowel elimination at home [facility]) (pt-stated)   On track     Care Coordination Interventions: Evaluation of current treatment plan related to bowel management and patient's adherence to plan as established by provider Reviewed medications with patient and discussed Linzess & Omeprazole  Discussed plans with patient for ongoing care management follow up and provided patient with direct contact information for care management team Confirmed her Linzess dose was increased and now she is having regular stools        Our next appointment is by telephone on na at na  Please call the care guide team at [phone] if you need to cancel or reschedule your appointment.   If you are experiencing a Mental Health or Behavioral Health Crisis or need someone to talk to, please call the Suicide and Crisis Lifeline: 988 call the [place] National Suicide Prevention Lifeline: 832 081 2572 or TTY: 801-831-3768 TTY 737-172-5384) to talk to a trained counselor call 1-800-273-TALK (toll free, 24 hour hotline) call the [facility]: [phone] call 911   Patient verbalizes understanding of instructions and care plan provided today and agrees to view in MyChart. Active MyChart status and patient understanding of how to access instructions and care plan via MyChart confirmed with patient.     The patient has been provided with contact information for the care management team and has been advised to call with any health related questions or concerns.   Bethanne Mule L. [name], RN, BSN, CCM [facility], [employer] [facility] RN  Care Manager Direct Dial: [phone]  Fax: [phone]

## 2024-01-14 ENCOUNTER — Other Ambulatory Visit: Payer: Self-pay | Admitting: Family Medicine

## 2024-01-14 DIAGNOSIS — Z794 Long term (current) use of insulin: Secondary | ICD-10-CM

## 2024-01-19 ENCOUNTER — Other Ambulatory Visit: Payer: Self-pay | Admitting: Family Medicine

## 2024-01-19 DIAGNOSIS — E1159 Type 2 diabetes mellitus with other circulatory complications: Secondary | ICD-10-CM

## 2024-02-23 ENCOUNTER — Other Ambulatory Visit: Payer: Self-pay | Admitting: Family Medicine

## 2024-02-23 DIAGNOSIS — Z794 Long term (current) use of insulin: Secondary | ICD-10-CM

## 2024-02-23 MED ORDER — LANCETS MISC. MISC: 99 refills | Status: AC

## 2024-02-25 ENCOUNTER — Other Ambulatory Visit: Payer: Self-pay | Admitting: Family Medicine

## 2024-02-25 DIAGNOSIS — E1159 Type 2 diabetes mellitus with other circulatory complications: Secondary | ICD-10-CM

## 2024-02-26 MED ORDER — LISINOPRIL 30 MG PO TABS: 30.0000 mg | ORAL_TABLET | Freq: Every day | ORAL | 0 refills | Status: DC

## 2024-02-26 NOTE — Telephone Encounter (Signed)
 Appt scheduled June 16 please send refill to pharm

## 2024-02-26 NOTE — Addendum Note (Signed)
 Addended by: Camdynn Maranto D on: 02/26/2024 08:18 AM   Modules accepted: Orders

## 2024-02-26 NOTE — Telephone Encounter (Signed)
 Casey Sanders pt NTBS 30-d given 01/20/24

## 2024-03-23 DIAGNOSIS — H3561 Retinal hemorrhage, right eye: Secondary | ICD-10-CM | POA: Diagnosis not present

## 2024-03-23 DIAGNOSIS — H4312 Vitreous hemorrhage, left eye: Secondary | ICD-10-CM | POA: Diagnosis not present

## 2024-03-23 DIAGNOSIS — H43822 Vitreomacular adhesion, left eye: Secondary | ICD-10-CM | POA: Diagnosis not present

## 2024-03-23 DIAGNOSIS — E113411 Type 2 diabetes mellitus with severe nonproliferative diabetic retinopathy with macular edema, right eye: Secondary | ICD-10-CM | POA: Diagnosis not present

## 2024-03-23 DIAGNOSIS — H43811 Vitreous degeneration, right eye: Secondary | ICD-10-CM | POA: Diagnosis not present

## 2024-03-23 DIAGNOSIS — H35033 Hypertensive retinopathy, bilateral: Secondary | ICD-10-CM | POA: Diagnosis not present

## 2024-03-23 DIAGNOSIS — E113512 Type 2 diabetes mellitus with proliferative diabetic retinopathy with macular edema, left eye: Secondary | ICD-10-CM | POA: Diagnosis not present

## 2024-03-24 ENCOUNTER — Other Ambulatory Visit: Payer: Self-pay | Admitting: Family Medicine

## 2024-03-24 DIAGNOSIS — E1169 Type 2 diabetes mellitus with other specified complication: Secondary | ICD-10-CM

## 2024-03-31 DIAGNOSIS — H4312 Vitreous hemorrhage, left eye: Secondary | ICD-10-CM | POA: Diagnosis not present

## 2024-03-31 DIAGNOSIS — H43812 Vitreous degeneration, left eye: Secondary | ICD-10-CM | POA: Diagnosis not present

## 2024-03-31 DIAGNOSIS — E113512 Type 2 diabetes mellitus with proliferative diabetic retinopathy with macular edema, left eye: Secondary | ICD-10-CM | POA: Diagnosis not present

## 2024-03-31 LAB — HM DIABETES EYE EXAM

## 2024-04-04 ENCOUNTER — Encounter: Payer: Self-pay | Admitting: Family Medicine

## 2024-04-04 ENCOUNTER — Ambulatory Visit (INDEPENDENT_AMBULATORY_CARE_PROVIDER_SITE_OTHER): Admitting: Family Medicine

## 2024-04-04 ENCOUNTER — Ambulatory Visit: Payer: Self-pay | Admitting: Family Medicine

## 2024-04-04 VITALS — BP 146/77 | HR 75 | Temp 98.2°F | Ht 61.0 in | Wt 181.0 lb

## 2024-04-04 DIAGNOSIS — R062 Wheezing: Secondary | ICD-10-CM | POA: Diagnosis not present

## 2024-04-04 DIAGNOSIS — F419 Anxiety disorder, unspecified: Secondary | ICD-10-CM

## 2024-04-04 DIAGNOSIS — E1159 Type 2 diabetes mellitus with other circulatory complications: Secondary | ICD-10-CM

## 2024-04-04 DIAGNOSIS — Z794 Long term (current) use of insulin: Secondary | ICD-10-CM | POA: Diagnosis not present

## 2024-04-04 DIAGNOSIS — E1142 Type 2 diabetes mellitus with diabetic polyneuropathy: Secondary | ICD-10-CM | POA: Diagnosis not present

## 2024-04-04 DIAGNOSIS — E785 Hyperlipidemia, unspecified: Secondary | ICD-10-CM

## 2024-04-04 DIAGNOSIS — I152 Hypertension secondary to endocrine disorders: Secondary | ICD-10-CM

## 2024-04-04 DIAGNOSIS — F32A Depression, unspecified: Secondary | ICD-10-CM | POA: Diagnosis not present

## 2024-04-04 DIAGNOSIS — E1169 Type 2 diabetes mellitus with other specified complication: Secondary | ICD-10-CM | POA: Diagnosis not present

## 2024-04-04 LAB — BAYER DCA HB A1C WAIVED: HB A1C (BAYER DCA - WAIVED): 5.9 % — ABNORMAL HIGH (ref 4.8–5.6)

## 2024-04-04 MED ORDER — INSULIN GLARGINE-YFGN 100 UNIT/ML ~~LOC~~ SOPN: 20.0000 [IU] | PEN_INJECTOR | Freq: Every day | SUBCUTANEOUS | 4 refills | Status: DC

## 2024-04-04 MED ORDER — AMLODIPINE BESYLATE 2.5 MG PO TABS: 2.5000 mg | ORAL_TABLET | Freq: Every day | ORAL | 3 refills | Status: DC

## 2024-04-04 MED ORDER — ALBUTEROL SULFATE HFA 108 (90 BASE) MCG/ACT IN AERS: 1.0000 | INHALATION_SPRAY | RESPIRATORY_TRACT | 0 refills | Status: AC | PRN

## 2024-04-04 MED ORDER — MIRTAZAPINE 15 MG PO TABS: 15.0000 mg | ORAL_TABLET | Freq: Every day | ORAL | 3 refills | Status: DC

## 2024-04-04 MED ORDER — ATORVASTATIN CALCIUM 80 MG PO TABS
80.0000 mg | ORAL_TABLET | Freq: Every day | ORAL | 3 refills | Status: DC
Start: 2024-04-04 — End: 2024-08-03

## 2024-04-04 MED ORDER — ALPHA-LIPOIC ACID 600 MG PO CAPS: 600.0000 mg | ORAL_CAPSULE | Freq: Every day | ORAL | 3 refills | Status: AC

## 2024-04-04 NOTE — Addendum Note (Signed)
 Addended by: Eliodoro Guerin on: 04/04/2024 03:28 PM   Modules accepted: Orders

## 2024-04-04 NOTE — Progress Notes (Addendum)
 Subjective: CC:DM PCP: Eliodoro Guerin, DO [id number] Staib is a 63 y.o. female presenting to clinic today for:  1. Type 2 Diabetes with hypertension, hyperlipidemia:  Patient is compliant with 22 units of Semglee  at bedtime.  She notes that she is been having some intermittent lows that have been waking her up at nighttime down into the 60s.  She does check a fingerstick glucose to confirm the freestyle libre 3+ and these are accurate.  She is eating enough during the daytime.  She often will drink apple juice or orange juice in the middle the night to bring her blood sugar up but sometimes it only comes up for an hour or 2 and then goes back down.  Diabetes Health Maintenance Due  Topic Date Due   FOOT EXAM  07/08/2023   HEMOGLOBIN A1C  01/27/2024   OPHTHALMOLOGY EXAM  04/04/2024 (Originally 02/13/2024)    Last A1c:  Lab Results  Component Value Date   HGBA1C 6.7 (H) 07/29/2023    ROS: No chest pain, shortness of breath.  She does have chronic numbness and tingling in the right foot   ROS: Per HPI  Allergies  Allergen Reactions   Codeine Hives, Nausea And Vomiting and Other (See Comments)    Other reaction(s): unknown   Empagliflozin      Caused blisters Other reaction(s): Not available   Metformin And Related     nausea   Niaspan [Niacin Er (Antihyperlipidemic)] Other (See Comments)    Increase blood glucose   Past Medical History:  Diagnosis Date   Allergic rhinitis    Anxiety and depression    Asthma    Benign essential HTN 01/04/2011   Chest pain    a. Myoview  9/05: no ischemia, no scar, EF 70%   Chest pain, unspecified 02/12/2011   Cystitis 02/08/2015   Diverticulosis    DM2 (diabetes mellitus, type 2) (HCC)    Extremity pain 01/04/2013   Fibromyalgia    Frequent UTI    GERD (gastroesophageal reflux disease)    Hiatal Hernia   HLD (hyperlipidemia)    HTN (hypertension)    Left rotator cuff tear    Low back pain 01/04/2013   Lumbar disc disease     Nephrolithiasis    Obesity    Palpitations    monitor 9/05: NSR, sinus tachy, PVCs   Pre-operative cardiovascular examination 02/12/2011   Rectal bleeding 08/22/2013   Sepsis secondary to UTI (HCC) 08/09/2017   uterine ca dx'd 1988   per pt    Current Outpatient Medications:    Accu-Chek Softclix Lancets lancets, Please use to test blood sugar 3 times daily as directed. DX: E11.9, Disp: 100 each, Rfl: 12   albuterol  (VENTOLIN  HFA) 108 (90 Base) MCG/ACT inhaler, Inhale 1 puff into the lungs every 4 (four) hours as needed for wheezing or shortness of breath., Disp: 9 g, Rfl: 0   amLODipine  (NORVASC ) 2.5 MG tablet, Take 1 tablet (2.5 mg total) by mouth daily. Add to lisinopril  for blood pressure, Disp: 90 tablet, Rfl: 3   aspirin  81 MG EC tablet, Take 81 mg by mouth daily., Disp: , Rfl:    atorvastatin  (LIPITOR ) 80 MG tablet, Take 1 tablet (80 mg total) by mouth daily., Disp: 90 tablet, Rfl: 3   Blood Glucose Monitoring Suppl DEVI, E11.69 Check BGs up to 4 times daily; May substitute to any manufacturer covered by patient's insurance., Disp: 1 each, Rfl: 0   ciclopirox  (PENLAC ) 8 % solution, Apply topically at bedtime. Apply  over nail and surrounding skin. Apply daily over previous coat. After seven (7) days, may remove with alcohol and continue cycle., Disp: 6.6 mL, Rfl: 0   Continuous Glucose Receiver (FREESTYLE LIBRE READER) DEVI, 1 Units by Does not apply route daily. UAD to test BGs daily. Dx E11.69, Disp: 1 each, Rfl: 1   Continuous Glucose Sensor (FREESTYLE LIBRE 3 PLUS SENSOR) MISC, Apply sensor to back of arm every 15 days to monitor glucose continuously. DX: E11.65, Disp: 2 each, Rfl: 6   diclofenac  sodium (VOLTAREN ) 1 % GEL, Apply 2 g topically 4 (four) times daily., Disp: 100 g, Rfl: 1   fish oil-omega-3 fatty acids 1000 MG capsule, Take 2 g by mouth daily., Disp: , Rfl:    Glucose Blood (BLOOD GLUCOSE TEST STRIPS) STRP, E11.69 Check BGs up to 4 times daily; May substitute to any  manufacturer covered by patient's insurance., Disp: 100 strip, Rfl: PRN   insulin  glargine-yfgn (SEMGLEE ) 100 UNIT/ML Pen, Inject 22 Units into the skin at bedtime. **NEEDS TO BE SEEN BEFORE NEXT REFILL**, Disp: 15 mL, Rfl: 0   Insulin  Pen Needle 32G X 6 MM MISC, UAD with insulin  E11.9, Disp: 100 each, Rfl: 3   Lancet Device MISC, E11.69 Check BGs up to 4 times daily; May substitute to any manufacturer covered by patient's insurance., Disp: 1 each, Rfl: 0   Lancets Misc. MISC, E11.69 Check BGs up to 4 times daily; May substitute to any manufacturer covered by patient's insurance., Disp: 100 each, Rfl: PRN   linaclotide (LINZESS) 145 MCG CAPS capsule, 1 capsule at least 30 minutes before the first meal of the day on an empty stomach, Disp: , Rfl:    lisinopril  (ZESTRIL ) 30 MG tablet, Take 1 tablet (30 mg total) by mouth daily., Disp: 90 tablet, Rfl: 0   mirtazapine  (REMERON ) 15 MG tablet, Take 1 tablet (15 mg total) by mouth at bedtime., Disp: 90 tablet, Rfl: 3 Social History   Socioeconomic History   Marital status: Widowed    Spouse name: Not on file   Number of children: 2   Years of education: 52   Highest education level: 12th grade  Occupational History   Occupation: disabled  Tobacco Use   Smoking status: Never    Passive exposure: Never   Smokeless tobacco: Never  Vaping Use   Vaping status: Never Used  Substance and Sexual Activity   Alcohol use: No   Drug use: No   Sexual activity: Not Currently    Partners: Male  Other Topics Concern   Not on file  Social History Narrative   2 daughters and 4 grandchildren.   Husband died in 05/04/2018.   Social Drivers of Corporate investment banker Strain: Low Risk  (10/30/2023)   Overall Financial Resource Strain (CARDIA)    Difficulty of Paying Living Expenses: Not hard at all  Food Insecurity: No Food Insecurity (01/13/2024)   Hunger Vital Sign    Worried About Running Out of Food in the Last Year: Never true    Ran Out of Food  in the Last Year: Never true  Transportation Needs: No Transportation Needs (01/13/2024)   PRAPARE - Administrator, Civil Service (Medical): No    Lack of Transportation (Non-Medical): No  Physical Activity: Inactive (10/30/2023)   Exercise Vital Sign    Days of Exercise per Week: 0 days    Minutes of Exercise per Session: 0 min  Stress: No Stress Concern Present (10/30/2023)   [employer]  of Occupational Health - Occupational Stress Questionnaire    Feeling of Stress : Not at all  Social Connections: Moderately Integrated (01/13/2024)   Social Connection and Isolation Panel    Frequency of Communication with Friends and Family: More than three times a week    Frequency of Social Gatherings with Friends and Family: Three times a week    Attends Religious Services: More than 4 times per year    Active Member of Clubs or Organizations: Yes    Attends Banker Meetings: 1 to 4 times per year    Marital Status: Widowed  Intimate Partner Violence: Not At Risk (01/13/2024)   Humiliation, Afraid, Rape, and Kick questionnaire    Fear of Current or Ex-Partner: No    Emotionally Abused: No    Physically Abused: No    Sexually Abused: No   Family History  Problem Relation Age of Onset   Coronary artery disease Mother    Coronary artery disease Father    Heart disease Brother    Heart disease Brother    Lung cancer Brother 65   Heart attack Brother 42       had a few heart attacks before   Heart disease Brother    Breast cancer Neg Hx     Objective: Office vital signs reviewed. BP (!) 146/77   Pulse 75   Temp 98.2 F (36.8 C)   Ht 5' 1 (1.549 m)   Wt 181 lb (82.1 kg)   SpO2 97%   BMI 34.20 kg/m   Physical Examination:  General: Awake, alert, well nourished, No acute distress HEENT: sclera white, MMM Cardio: regular rate and rhythm, S1S2 heard, no murmurs appreciated Pulm: clear to auscultation bilaterally, no wheezes, rhonchi or rales; normal work  of breathing on room air  Diabetic Foot Exam - Simple   Simple Foot Form Diabetic Foot exam was performed with the following findings: Yes 04/04/2024  3:27 PM  Visual Inspection See comments: Yes Sensation Testing See comments: Yes Pulse Check Posterior Tibialis and Dorsalis pulse intact bilaterally: Yes Comments Onychomycotic changes noted to the nails bilaterally.  She does have monofilament sensation in the right foot but they are less sensitive than the left      Assessment/ Plan: 63 y.o. female   Type 2 diabetes mellitus with other specified complication, with long-term current use of insulin  (HCC) - Plan: CMP14+EGFR, Bayer DCA Hb A1c Waived, Microalbumin / creatinine urine ratio, insulin  glargine-yfgn (SEMGLEE ) 100 UNIT/ML Pen, AMB Referral VBCI Care Management  Hyperlipidemia associated with type 2 diabetes mellitus (HCC) - Plan: CMP14+EGFR, Lipid Panel, atorvastatin  (LIPITOR ) 80 MG tablet  Hypertension associated with diabetes (HCC) - Plan: CMP14+EGFR, amLODipine  (NORVASC ) 2.5 MG tablet  Diabetic polyneuropathy associated with type 2 diabetes mellitus (HCC) - Plan: CMP14+EGFR, Alpha-Lipoic Acid 600 MG CAPS  Wheezing - Plan: albuterol  (VENTOLIN  HFA) 108 (90 Base) MCG/ACT inhaler  Anxiety - Plan: mirtazapine  (REMERON ) 15 MG tablet  Depressive disorder - Plan: mirtazapine  (REMERON ) 15 MG tablet  Check A1c.  Reduce Semglee  to 20 units at bedtime.  Freestyle libre sample given today.  I am going to refer her to clinical pharmacy to have a libre review in the next 2 to 3 weeks.  We discussed may need to continue reducing long-acting and add mealtime insulin  instead.  Urine microalbumin collected.  Nonfasting lipid.  Medications have been renewed  Blood pressure controlled upon recheck  I have ordered alpha lipoic acid for the neuropathy in the right foot.  We did not discuss anxiety, depression or wheezing but needed refills of these were sent  Eliodoro Guerin,  DO [facility] [phone]

## 2024-04-05 LAB — LIPID PANEL
Chol/HDL Ratio: 2.1 ratio (ref 0.0–4.4)
Cholesterol, Total: 129 mg/dL (ref 100–199)
HDL: 62 mg/dL (ref >39)
LDL Chol Calc (NIH): 49 mg/dL (ref 0–99)
Triglycerides: 94 mg/dL (ref 0–149)
VLDL Cholesterol Cal: 18 mg/dL (ref 5–40)

## 2024-04-05 LAB — CMP14+EGFR
ALT: 19 IU/L (ref 0–32)
AST: 22 IU/L (ref 0–40)
Albumin: 4.6 g/dL (ref 3.9–4.9)
Alkaline Phosphatase: 63 IU/L (ref 44–121)
BUN/Creatinine Ratio: 25 (ref 12–28)
BUN: 23 mg/dL (ref 8–27)
Bilirubin Total: 0.4 mg/dL (ref 0.0–1.2)
CO2: 23 mmol/L (ref 20–29)
Calcium: 10.2 mg/dL (ref 8.7–10.3)
Chloride: 101 mmol/L (ref 96–106)
Creatinine, Ser: 0.91 mg/dL (ref 0.57–1.00)
Globulin, Total: 2.4 g/dL (ref 1.5–4.5)
Glucose: 108 mg/dL — ABNORMAL HIGH (ref 70–99)
Potassium: 4.2 mmol/L (ref 3.5–5.2)
Sodium: 141 mmol/L (ref 134–144)
Total Protein: 7 g/dL (ref 6.0–8.5)
eGFR: 70 mL/min/{1.73_m2} (ref >59)

## 2024-04-05 LAB — MICROALBUMIN / CREATININE URINE RATIO
Creatinine, Urine: 28.9 mg/dL
Microalb/Creat Ratio: 20 mg/g{creat} (ref 0–29)
Microalbumin, Urine: 5.9 ug/mL

## 2024-04-06 ENCOUNTER — Other Ambulatory Visit (INDEPENDENT_AMBULATORY_CARE_PROVIDER_SITE_OTHER)

## 2024-04-06 NOTE — Progress Notes (Deleted)
   04/06/2024 Name: Casey Sanders MRN: [id number] DOB: 28-Jan-1961  No chief complaint on file.   {Visit Type:26650}   Subjective:  Care Team: Primary Care Provider: Eliodoro Guerin, DO ; Next Scheduled Visit: 08/03/24 {careteamprovider:27366}  Medication Access/Adherence  Current Pharmacy:  [facility] [address] [place] - [address] [place] [address] Phone: [phone] Fax: [phone]   Patient reports affordability concerns with their medications: {YES/NO:21197} Patient reports access/transportation concerns to their pharmacy: {YES/NO:21197} Patient reports adherence concerns with their medications:  {YES/NO:21197} ***  Chronic   Diabetes:  Current medications: Semglee  20 units daily Medications tried in the past: Jardiance  (blisters), metformin (nausea)  Current glucose readings: *** Using *** meter; testing *** times daily  Libre 3+: last night did not drop low after taking only 20 units of Semglee  Date of Download: *** % Time CGM is active: ***% Average Glucose: *** mg/dL Glucose Management Indicator: ***  Glucose Variability: *** (goal <36%) Time in Goal:  - Time in range 70-180: ***% - Time above range: ***% - Time below range: ***% Observed patterns:  Patient {Actions; denies-reports:120008} hypoglycemic s/sx including ***dizziness, shakiness, sweating. Patient {Actions; denies-reports:120008} hyperglycemic symptoms including ***polyuria, polydipsia, polyphagia, nocturia, neuropathy, blurred vision.  Current meal patterns:  - Breakfast: *** - Lunch *** - Supper *** - Snacks *** - Drinks ***  Current physical activity: ***  Current medication access support: ***  Objective:  Lab Results  Component Value Date   HGBA1C 5.9 (H) 04/04/2024    Lab Results  Component Value Date   CREATININE 0.91 04/04/2024   BUN 23 04/04/2024   NA 141 04/04/2024   K 4.2 04/04/2024   CL 101 04/04/2024   CO2 23  04/04/2024    Lab Results  Component Value Date   CHOL 129 04/04/2024   HDL 62 04/04/2024   LDLCALC 49 04/04/2024   TRIG 94 04/04/2024   CHOLHDL 2.1 04/04/2024    Medications Reviewed Today   Medications were not reviewed in this encounter       Assessment/Plan:   {Pharmacy A/P Choices:26421}  Follow Up Plan: ***  ***

## 2024-04-07 NOTE — Progress Notes (Signed)
 error

## 2024-04-14 ENCOUNTER — Other Ambulatory Visit

## 2024-04-14 ENCOUNTER — Telehealth: Payer: Self-pay

## 2024-04-14 ENCOUNTER — Other Ambulatory Visit (HOSPITAL_COMMUNITY): Payer: Self-pay

## 2024-04-14 ENCOUNTER — Telehealth: Payer: Self-pay | Admitting: Pharmacist

## 2024-04-14 ENCOUNTER — Other Ambulatory Visit: Admitting: Pharmacist

## 2024-04-14 DIAGNOSIS — Z794 Long term (current) use of insulin: Secondary | ICD-10-CM

## 2024-04-14 DIAGNOSIS — E119 Type 2 diabetes mellitus without complications: Secondary | ICD-10-CM

## 2024-04-14 NOTE — Progress Notes (Signed)
 Pharmacy Medication Assistance Program Note    04/14/2024  Patient ID: Casey Sanders, female   DOB: 09-21-1961, 63 y.o.   MRN: [id number]     04/14/2024  Outreach Medication One  Manufacturer Medication One Sanofi  Sanofi Drugs Lantus   Type of Sport and exercise psychologist  Date Application Sent to Prescriber 04/14/2024    NEW  Emailed application to [employer].

## 2024-04-14 NOTE — Telephone Encounter (Signed)
 Please securely route me entire PAP for Lantus /Sanofi  Current dose is 20 units daily Reports copay is >$100

## 2024-04-14 NOTE — Progress Notes (Addendum)
 04/14/2024 Name: Casey Sanders MRN: [id number] DOB: 03/31/1961  Chief Complaint  Patient presents with   Diabetes    Casey Sanders is a 63 y.o. year old female who presented for a telephone visit.  I connected with  Casey Sanders on 04/14/24 by telephone and verified that I am speaking with the correct person using two identifiers.  I discussed the limitations of evaluation and management by telemedicine. The patient expressed understanding and agreed to proceed.  Patient was located in her home and PharmD in PCP office during this visit.   They were referred to the pharmacist by their PCP for assistance in managing diabetes and medication access.    Subjective:  Care Team: Primary Care Provider: Jolinda Norene HERO, DO ; Next Scheduled Visit: 08/03/2024  Medication Access/Adherence  Current Pharmacy:  [facility] [address] [place] - [address] [place] [address] Phone: [phone] Fax: [phone]   Patient reports affordability concerns with their medications: Yes  Patient reports access/transportation concerns to their pharmacy: No  Patient reports adherence concerns with their medications:  No     Diabetes:  Current medications: Lantus  20 units daily (recently decreased due to hypoglycemia) Medications tried in the past: metformin, empagliflozin   Current glucose readings: FBG<130, post prandials <180 Reports hypoglycemia has resolved with decreased insulin  dose Using Libre 3 CGM meter   Patient denies current hypoglycemic s/sx including confusion, light-headed (described these symptoms in the past)  Patient denies hyperglycemic symptoms   Current meal patterns:  Discussed meal planning options and Plate method for healthy eating Avoid sugary drinks and desserts Incorporate balanced protein, non starchy veggies, 1 serving of carbohydrate with each meal Increase water intake Increase physical activity as able  Current physical  activity: encouraged as able  Current medication access support: will enroll in the sanofi PAP   Objective:  Lab Results  Component Value Date   HGBA1C 5.9 (H) 04/04/2024    Lab Results  Component Value Date   CREATININE 0.91 04/04/2024   BUN 23 04/04/2024   NA 141 04/04/2024   K 4.2 04/04/2024   CL 101 04/04/2024   CO2 23 04/04/2024    Lab Results  Component Value Date   CHOL 129 04/04/2024   HDL 62 04/04/2024   LDLCALC 49 04/04/2024   TRIG 94 04/04/2024   CHOLHDL 2.1 04/04/2024    Medications Reviewed Today     Reviewed by Billee Mliss BIRCH, [facility] (Pharmacist) on 04/14/24 at [phone]  Med List Status: <None>   Medication Order Taking? Sig Documenting Provider Last Dose Status Informant  Accu-Chek Softclix Lancets lancets [id number] No Please use to test blood sugar 3 times daily as directed. DX: E11.9 Jolinda Norene HERO, DO Taking Active   albuterol  (VENTOLIN  HFA) 108 (90 Base) MCG/ACT inhaler [id number]  Inhale 1 puff into the lungs every 4 (four) hours as needed for wheezing or shortness of breath. Jolinda Norene M, DO  Active   Alpha-Lipoic Acid 600 MG CAPS [id number]  Take 1 capsule (600 mg total) by mouth daily. For nerve pain in foot Jolinda Norene M, DO  Active   amLODipine  (NORVASC ) 2.5 MG tablet [id number]  Take 1 tablet (2.5 mg total) by mouth daily. Add to lisinopril  for blood pressure Jolinda Norene M, DO  Active   aspirin  81 MG EC tablet [id number]  Take 81 mg by mouth daily. [provider]  Active Self  atorvastatin  (LIPITOR ) 80 MG tablet [id number]  Take 1 tablet (80 mg  total) by mouth daily. Jolinda Norene HERO, DO  Active   Blood Glucose Monitoring Suppl DEVI [id number] No E11.69 Check BGs up to 4 times daily; May substitute to any manufacturer covered by patient's insurance. Jolinda Norene M, DO Taking Active   ciclopirox  (PENLAC ) 8 % solution [id number] No Apply topically at bedtime. Apply over nail and surrounding skin. Apply daily over previous  coat. After seven (7) days, may remove with alcohol and continue cycle. Jolinda Norene HERO, DO Taking Active   Continuous Glucose Receiver (FREESTYLE LIBRE READER) DEVI [id number] No 1 Units by Does not apply route daily. UAD to test BGs daily. Dx E11.69 Jolinda Norene HERO, DO Taking Active   Continuous Glucose Sensor (FREESTYLE LIBRE 3 PLUS SENSOR) MISC [id number] No Apply sensor to back of arm every 15 days to monitor glucose continuously. DX: E11.65 Jolinda Norene HERO, DO Taking Active   diclofenac  sodium (VOLTAREN ) 1 % GEL [id number]  Apply 2 g topically 4 (four) times daily. Cyrena Gwenn SQUIBB, MD  Active Self  fish oil-omega-3 fatty acids 1000 MG capsule [id number]  Take 2 g by mouth daily. [provider]  Active Self  Glucose Blood (BLOOD GLUCOSE TEST STRIPS) STRP [id number] No E11.69 Check BGs up to 4 times daily; May substitute to any manufacturer covered by patient's insurance. Jolinda Norene M, DO Taking Active   insulin  glargine-yfgn (SEMGLEE ) 100 UNIT/ML Pen [id number]  Inject 20 Units into the skin at bedtime. Jolinda Norene HERO, DO  Active   Insulin  Pen Needle 32G X 6 MM MISC [id number] No UAD with insulin  E11.9 Jolinda Norene HERO, DO Taking Active   Lancet Device MISC [id number] No E11.69 Check BGs up to 4 times daily; May substitute to any manufacturer covered by patient's insurance. Jolinda Norene HERO, DO Taking Active   Lancets Misc. MISC [id number]  E11.69 Check BGs up to 4 times daily; May substitute to any manufacturer covered by patient's insurance. Jolinda Norene M, DO  Active   linaclotide LARUE) 145 MCG CAPS capsule [id number]  1 capsule at least 30 minutes before the first meal of the day on an empty stomach  Patient not taking: Reported on 04/06/2024   [provider]  Active   lisinopril  (ZESTRIL ) 30 MG tablet [id number]  Take 1 tablet (30 mg total) by mouth daily. Jolinda Norene M, DO  Active   mirtazapine  (REMERON ) 15 MG tablet [id number]  Take 1 tablet  (15 mg total) by mouth at bedtime. Jolinda Norene M, DO  Active               Assessment/Plan:   Diabetes: - Currently controlled - Reviewed long term cardiovascular and renal outcomes of uncontrolled blood sugar - Reviewed goal A1c, goal fasting, and goal 2 hour post prandial glucose - Reviewed dietary modifications including ensure protein/fiber snack at bedtime  - Reviewed lifestyle modifications including:  - Recommend to  continue current regimen as hypoglycemia has resolved--Lantus  20 units daily Lantus  samples and FSL 3 plus CGM sensors placed up front for patient to pick up Will need to transition patient to FSL 3 PLUS by September 2025 due to product discontinuation - Patient denies personal or family history of multiple endocrine neoplasia type 2, medullary thyroid  cancer; personal history of pancreatitis or gallbladder disease. - Recommend to check glucose using FSL 3 plus - Meets financial criteria for Lantus  patient assistance program through sanofi PAP. Will collaborate with provider, CPhT, and patient to pursue assistance.     Follow Up Plan: 4 weeks  Jatavion Peaster Dattero Adan Beal, PharmD, BCACP, CPP Clinical Pharmacist, [facility]   30 min of patient care was provided to the patient during this visit time.  This is a no charge visit

## 2024-04-21 DIAGNOSIS — H4312 Vitreous hemorrhage, left eye: Secondary | ICD-10-CM | POA: Diagnosis not present

## 2024-04-21 DIAGNOSIS — E113512 Type 2 diabetes mellitus with proliferative diabetic retinopathy with macular edema, left eye: Secondary | ICD-10-CM | POA: Diagnosis not present

## 2024-04-21 DIAGNOSIS — E113411 Type 2 diabetes mellitus with severe nonproliferative diabetic retinopathy with macular edema, right eye: Secondary | ICD-10-CM | POA: Diagnosis not present

## 2024-04-21 DIAGNOSIS — H35033 Hypertensive retinopathy, bilateral: Secondary | ICD-10-CM | POA: Diagnosis not present

## 2024-04-21 DIAGNOSIS — H3561 Retinal hemorrhage, right eye: Secondary | ICD-10-CM | POA: Diagnosis not present

## 2024-04-21 DIAGNOSIS — H43813 Vitreous degeneration, bilateral: Secondary | ICD-10-CM | POA: Diagnosis not present

## 2024-05-06 ENCOUNTER — Other Ambulatory Visit: Payer: Self-pay | Admitting: Family Medicine

## 2024-05-06 DIAGNOSIS — Z794 Long term (current) use of insulin: Secondary | ICD-10-CM

## 2024-05-06 NOTE — Telephone Encounter (Signed)
 SEMGLEE , YFGN, 100 UNIT/ML Pen   Pharmacy comment: we are no longer able to order semglee , please change

## 2024-05-09 ENCOUNTER — Other Ambulatory Visit: Payer: Self-pay | Admitting: Family Medicine

## 2024-05-09 DIAGNOSIS — E1169 Type 2 diabetes mellitus with other specified complication: Secondary | ICD-10-CM

## 2024-05-09 NOTE — Telephone Encounter (Signed)
 done

## 2024-05-09 NOTE — Telephone Encounter (Signed)
 Name from pharmacy: SEMGLEE  100U/ML PEN INJ  Pharmacy comment: we are unable to order this product.

## 2024-06-01 ENCOUNTER — Other Ambulatory Visit: Payer: Self-pay | Admitting: Family Medicine

## 2024-06-01 DIAGNOSIS — E1159 Type 2 diabetes mellitus with other circulatory complications: Secondary | ICD-10-CM

## 2024-06-10 ENCOUNTER — Other Ambulatory Visit: Payer: Self-pay | Admitting: Family Medicine

## 2024-06-10 DIAGNOSIS — E119 Type 2 diabetes mellitus without complications: Secondary | ICD-10-CM

## 2024-06-22 DIAGNOSIS — E113512 Type 2 diabetes mellitus with proliferative diabetic retinopathy with macular edema, left eye: Secondary | ICD-10-CM | POA: Diagnosis not present

## 2024-06-22 DIAGNOSIS — E113411 Type 2 diabetes mellitus with severe nonproliferative diabetic retinopathy with macular edema, right eye: Secondary | ICD-10-CM | POA: Diagnosis not present

## 2024-06-22 DIAGNOSIS — H4312 Vitreous hemorrhage, left eye: Secondary | ICD-10-CM | POA: Diagnosis not present

## 2024-06-22 DIAGNOSIS — H35033 Hypertensive retinopathy, bilateral: Secondary | ICD-10-CM | POA: Diagnosis not present

## 2024-06-22 DIAGNOSIS — H43813 Vitreous degeneration, bilateral: Secondary | ICD-10-CM | POA: Diagnosis not present

## 2024-06-22 DIAGNOSIS — H3561 Retinal hemorrhage, right eye: Secondary | ICD-10-CM | POA: Diagnosis not present

## 2024-07-15 ENCOUNTER — Telehealth: Payer: Self-pay | Admitting: *Deleted

## 2024-07-15 DIAGNOSIS — E1169 Type 2 diabetes mellitus with other specified complication: Secondary | ICD-10-CM

## 2024-07-15 MED ORDER — FREESTYLE LIBRE READER DEVI: 1.0000 [IU] | Freq: Every day | 1 refills | Status: AC

## 2024-07-15 NOTE — Telephone Encounter (Signed)
 Pt's reader could not read her sensors, sending in new Rx since last one was last July

## 2024-07-18 NOTE — Telephone Encounter (Signed)
 Pt needs a sample because her insurance will not cover a new. She picked up a censor refill about a week ago. Please call back if we can get her a sample.

## 2024-07-18 NOTE — Telephone Encounter (Signed)
 Aware there is a sensor sample at the front desk for her.

## 2024-07-29 ENCOUNTER — Other Ambulatory Visit: Payer: Self-pay | Admitting: Family Medicine

## 2024-07-29 DIAGNOSIS — Z13820 Encounter for screening for osteoporosis: Secondary | ICD-10-CM

## 2024-07-29 DIAGNOSIS — Z780 Asymptomatic menopausal state: Secondary | ICD-10-CM

## 2024-08-03 ENCOUNTER — Encounter: Payer: Self-pay | Admitting: Family Medicine

## 2024-08-03 ENCOUNTER — Ambulatory Visit: Admitting: Family Medicine

## 2024-08-03 ENCOUNTER — Ambulatory Visit (INDEPENDENT_AMBULATORY_CARE_PROVIDER_SITE_OTHER)

## 2024-08-03 VITALS — BP 149/75 | HR 82 | Temp 97.6°F | Ht 61.0 in | Wt 183.0 lb

## 2024-08-03 DIAGNOSIS — E1169 Type 2 diabetes mellitus with other specified complication: Secondary | ICD-10-CM

## 2024-08-03 DIAGNOSIS — E1159 Type 2 diabetes mellitus with other circulatory complications: Secondary | ICD-10-CM

## 2024-08-03 DIAGNOSIS — E785 Hyperlipidemia, unspecified: Secondary | ICD-10-CM | POA: Diagnosis not present

## 2024-08-03 DIAGNOSIS — F419 Anxiety disorder, unspecified: Secondary | ICD-10-CM | POA: Diagnosis not present

## 2024-08-03 DIAGNOSIS — Z23 Encounter for immunization: Secondary | ICD-10-CM | POA: Diagnosis not present

## 2024-08-03 DIAGNOSIS — F32A Depression, unspecified: Secondary | ICD-10-CM

## 2024-08-03 DIAGNOSIS — I152 Hypertension secondary to endocrine disorders: Secondary | ICD-10-CM | POA: Diagnosis not present

## 2024-08-03 DIAGNOSIS — E1142 Type 2 diabetes mellitus with diabetic polyneuropathy: Secondary | ICD-10-CM

## 2024-08-03 DIAGNOSIS — Z13820 Encounter for screening for osteoporosis: Secondary | ICD-10-CM

## 2024-08-03 DIAGNOSIS — Z794 Long term (current) use of insulin: Secondary | ICD-10-CM | POA: Diagnosis not present

## 2024-08-03 DIAGNOSIS — Z780 Asymptomatic menopausal state: Secondary | ICD-10-CM | POA: Diagnosis not present

## 2024-08-03 LAB — BAYER DCA HB A1C WAIVED: HB A1C (BAYER DCA - WAIVED): 6.4 % — ABNORMAL HIGH (ref 4.8–5.6)

## 2024-08-03 MED ORDER — LANTUS SOLOSTAR 100 UNIT/ML ~~LOC~~ SOPN: 20.0000 [IU] | PEN_INJECTOR | Freq: Every day | SUBCUTANEOUS | 6 refills | Status: AC

## 2024-08-03 MED ORDER — MIRTAZAPINE 30 MG PO TABS: 30.0000 mg | ORAL_TABLET | Freq: Every day | ORAL | 3 refills | Status: AC

## 2024-08-03 MED ORDER — AMLODIPINE BESYLATE 2.5 MG PO TABS: 2.5000 mg | ORAL_TABLET | Freq: Every day | ORAL | 3 refills | Status: AC

## 2024-08-03 MED ORDER — ATORVASTATIN CALCIUM 80 MG PO TABS: 80.0000 mg | ORAL_TABLET | Freq: Every day | ORAL | 3 refills | Status: AC

## 2024-08-03 MED ORDER — LISINOPRIL 30 MG PO TABS: 30.0000 mg | ORAL_TABLET | Freq: Every day | ORAL | 3 refills | Status: AC

## 2024-08-03 NOTE — Patient Instructions (Signed)
 Bone Density Test: What to Expect A bone density test uses a type of X-ray to measure the amount of calcium and other minerals in your bones. It can measure bone density in the hip and the spine. This test may also be called: Bone densitometry. Bone mineral density test. Dual-energy X-ray absorptiometry (DEXA). You may have this test to: Diagnose or screen for a condition that causes weak or thin bones, called osteoporosis. See what your risk is for a broken bone, also called a fracture. Check how well your treatment for weak or thin bones is working. The test is similar to having a regular X-ray. Tell a health care provider about: Any allergies you have. All medicines you're taking, including vitamins, herbs, eye drops, creams, and over-the-counter medicines. Any problems you or family members have had with anesthesia. Any bleeding problems you have. Any surgeries you've had. Any medical conditions you have. Whether you're pregnant or may be pregnant. Any medical tests you've had within the past 14 days that used contrast. What are the risks? Your health care provider will talk with you about risks. These may include: Being exposed to a small amount of radiation. This can slightly increase your cancer risk. What happens before the test? Do not take any calcium supplements within the 24 hours before your test. You'll need to take off: All metal jewelry. Eyeglasses. Removable dental appliances. Any other metal objects on your body. What happens during the test?  You'll lie down on an exam table. There will be an X-ray machine below you and an imaging device above you. Other devices, such as boxes or braces, may be used to position your body for the scan. The machine will slowly scan your body. You'll need to keep very still while the machine does the scan. The images will show up on a screen in the room. Images will be checked by a specialist after your test is finished. These  steps may vary. Ask what you can expect. What can I expect after the test? Ask when your results will be ready and how to get them. You may need to call or meet with your provider to get your results. This information is not intended to replace advice given to you by your health care provider. Make sure you discuss any questions you have with your health care provider. Document Revised: 02/15/2023 Document Reviewed: 02/15/2023 Elsevier Patient Education  2024 [employer].

## 2024-08-03 NOTE — Progress Notes (Signed)
 Subjective: CC:DM PCP: Jolinda Casey HERO, DO YEP:Casey Sanders is a 63 y.o. female presenting to clinic today for:  Type 2 Diabetes with hypertension, hyperlipidemia:  Glucometer: Freestyle libre 3.  She accidentally hit a blood vessel when she applied to the right arm recently and had to throw one of them away.  She is currently wearing 1 but will be running out prematurely.  She does report 2 hypoglycemic episodes into the 60s over the last 90 days.  She started eating a protein bar before bed and that has improved symptoms.  She reports no chest pain, shortness of breath, visual disturbance.    Depression/ anxiety She is not able to drive.  She does report some symptoms of loneliness but this has gotten slightly better as her daughter has been taking her out a lot more to the ballgames of her grandchildren.  She is somewhat tearful when discussing her sister-in-law's health, which is sadly declining due to advanced lung disease.  She tries to stay busy to keep her mind occupied.  Her son resides with her but he works all day and then typically goes to school in the afternoon so has not home till late at nighttime.   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 Myoview  9/05: no ischemia, no scar, EF 70%   Chest pain, unspecified 02/12/2011   Cystitis  02/08/2015   Diverticulosis    DM2 (diabetes mellitus, type 2) (HCC)    Extremity pain 01/04/2013   Fibromyalgia    Frequent UTI    GERD (gastroesophageal reflux disease)    Hiatal Hernia   HLD (hyperlipidemia)    HTN (hypertension)    Left rotator cuff tear    Low back pain 01/04/2013   Lumbar disc disease    Nephrolithiasis    Obesity    Palpitations    monitor 9/05: NSR, sinus tachy, PVCs   Pre-operative cardiovascular examination 02/12/2011   Rectal bleeding 08/22/2013   Sepsis secondary to UTI (HCC) 08/09/2017   uterine ca dx'd 1988   per pt    Current Outpatient Medications:    Accu-Chek Softclix Lancets lancets, Please use to test blood sugar 3 times daily as directed. DX: E11.9, Disp: 100 each, Rfl: 12   albuterol  (VENTOLIN  HFA) 108 (90 Base) MCG/ACT inhaler, Inhale 1 puff into the lungs every 4 (four) hours as needed for wheezing or shortness of breath., Disp: 9 g, Rfl: 0   Alpha-Lipoic Acid 600 MG CAPS, Take 1 capsule (600 mg total) by mouth daily. For nerve pain in foot, Disp: 100 capsule, Rfl: 3   amLODipine  (NORVASC ) 2.5 MG tablet, Take 1 tablet (2.5 mg total) by mouth daily. Add to lisinopril  for blood pressure, Disp: 90 tablet, Rfl: 3   aspirin   81 MG EC tablet, Take 81 mg by mouth daily., Disp: , Rfl:    atorvastatin  (LIPITOR ) 80 MG tablet, Take 1 tablet (80 mg total) by mouth daily., Disp: 90 tablet, Rfl: 3   Blood Glucose Monitoring Suppl DEVI, E11.69 Check BGs up to 4 times daily; May substitute to any manufacturer covered by patient's insurance., Disp: 1 each, Rfl: 0   ciclopirox  (PENLAC ) 8 % solution, Apply topically at bedtime. Apply over nail and surrounding skin. Apply daily over previous coat. After seven (7) days, may remove with alcohol and continue cycle., Disp: 6.6 mL, Rfl: 0   Continuous Glucose Receiver (FREESTYLE LIBRE READER) DEVI, 1 Units by Does not apply route daily. UAD to test BGs daily. Dx E11.69, Disp: 1 each, Rfl: 1   Continuous Glucose Sensor  (FREESTYLE LIBRE 3 SENSOR) MISC, PLACE 1 SENSOR ON THE SKIN EVERY 14 DAYS TO CHECK GLUCOSE CONTINUOUSLY, Disp: 6 each, Rfl: 3   diclofenac  sodium (VOLTAREN ) 1 % GEL, Apply 2 g topically 4 (four) times daily., Disp: 100 g, Rfl: 1   fish oil-omega-3 fatty acids 1000 MG capsule, Take 2 g by mouth daily., Disp: , Rfl:    Glucose Blood (BLOOD GLUCOSE TEST STRIPS) STRP, E11.69 Check BGs up to 4 times daily; May substitute to any manufacturer covered by patient's insurance., Disp: 100 strip, Rfl: PRN   insulin  glargine (LANTUS  SOLOSTAR) 100 UNIT/ML Solostar Pen, Inject 20 Units into the skin daily. To replace semglee , Disp: 15 mL, Rfl: 6   Insulin  Pen Needle 32G X 6 MM MISC, UAD with insulin  E11.9, Disp: 100 each, Rfl: 3   Lancet Device MISC, E11.69 Check BGs up to 4 times daily; May substitute to any manufacturer covered by patient's insurance., Disp: 1 each, Rfl: 0   Lancets Misc. MISC, E11.69 Check BGs up to 4 times daily; May substitute to any manufacturer covered by patient's insurance., Disp: 100 each, Rfl: PRN   linaclotide (LINZESS) 145 MCG CAPS capsule, 1 capsule at least 30 minutes before the first meal of the day on an empty stomach (Patient not taking: Reported on 04/06/2024), Disp: , Rfl:    lisinopril  (ZESTRIL ) 30 MG tablet, Take 1 tablet by mouth once daily, Disp: 90 tablet, Rfl: 0   mirtazapine  (REMERON ) 15 MG tablet, Take 1 tablet (15 mg total) by mouth at bedtime., Disp: 90 tablet, Rfl: 3 Social History   Socioeconomic History   Marital status: Widowed    Spouse name: Not on file   Number of children: 2   Years of education: 2   Highest education level: 12th grade  Occupational History   Occupation: disabled  Tobacco Use   Smoking status: Never    Passive exposure: Never   Smokeless tobacco: Never  Vaping Use   Vaping status: Never Used  Substance and Sexual Activity   Alcohol use: No   Drug use: No   Sexual activity: Not Currently    Partners: Male  Other Topics Concern    Not on file  Social History Narrative   2 daughters and 4 grandchildren.   Husband died in 04/30/2018.   Social Drivers of Corporate investment banker Strain: Low Risk  (10/30/2023)   Overall Financial Resource Strain (CARDIA)    Difficulty of Paying Living Expenses: Not hard at all  Food Insecurity: No Food Insecurity (01/13/2024)   Hunger Vital Sign    Worried About Running Out of Food in the Last Year: Never true    Ran Out of Food in  the Last Year: Never true  Transportation Needs: No Transportation Needs (01/13/2024)   PRAPARE - Administrator, Civil Service (Medical): No    Lack of Transportation (Non-Medical): No  Physical Activity: Inactive (10/30/2023)   Exercise Vital Sign    Days of Exercise per Week: 0 days    Minutes of Exercise per Session: 0 min  Stress: No Stress Concern Present (10/30/2023)   Casey Sanders    Feeling of Stress : Not at all  Social Connections: Moderately Integrated (01/13/2024)   Social Connection and Isolation Panel    Frequency of Communication with Friends and Family: More than three times a week    Frequency of Social Gatherings with Friends and Family: Three times a week    Attends Religious Services: More than 4 times per year    Active Member of Clubs or Organizations: Yes    Attends Banker Meetings: 1 to 4 times per year    Marital Status: Widowed  Intimate Partner Violence: Not At Risk (01/13/2024)   Humiliation, Afraid, Rape, and Kick Sanders    Fear of Current or Ex-Partner: No    Emotionally Abused: No    Physically Abused: No    Sexually Abused: No   Family History  Problem Relation Age of Onset   Coronary artery disease Mother    Coronary artery disease Father    Heart disease Brother    Heart disease Brother    Lung cancer Brother 69   Heart attack Brother 21       had a few heart attacks before   Heart disease Brother    Breast cancer  Neg Hx     Objective: Office vital signs reviewed. BP (!) 149/75   Pulse 82   Temp 97.6 F (36.4 C)   Ht 5' 1 (1.549 m)   Wt 183 lb (83 kg)   SpO2 94%   BMI 34.58 kg/m   Physical Examination:  General: Awake, alert, well-nourished elderly female, No acute distress HEENT: Sclera white.  Moist mucous membranes Cardio: regular rate and rhythm, S1S2 heard, no murmurs appreciated Pulm: clear to auscultation bilaterally, no wheezes, rhonchi or rales; normal work of breathing on room air Psych: Tearful when talking about her family member  Lab Results  Component Value Date   HGBA1C 5.9 (H) 04/04/2024      08/03/2024    1:29 PM 04/04/2024    2:36 PM 01/13/2024   12:05 PM  Depression screen PHQ 2/9  Decreased Interest 0 0 0  Down, Depressed, Hopeless 0 0 0  PHQ - 2 Score 0 0 0  Altered sleeping  0   Tired, decreased energy  0   Change in appetite  0   Feeling bad or failure about yourself   0   Trouble concentrating  0   Moving slowly or fidgety/restless  0   Suicidal thoughts  0   PHQ-9 Score  0   Difficult doing work/chores  Not difficult at all       04/04/2024    2:36 PM 05/29/2023    3:48 PM 07/07/2022    3:35 PM 03/13/2022   10:43 AM  GAD 7 : Generalized Anxiety Score  Nervous, Anxious, on Edge 0 1 1 1   Control/stop worrying 0 0 0 0  Worry too much - different things 0 1 0 0  Trouble relaxing 0 1 0 1  Restless 0 1 0 0  Easily  annoyed or irritable 0 1 0 0  Afraid - awful might happen 0 1 0 0  Total GAD 7 Score 0 6 1 2   Anxiety Difficulty Not difficult at all Somewhat difficult Not difficult at all Somewhat difficult   Assessment/ Plan: 63 y.o. female   Type 2 diabetes mellitus with other specified complication, with long-term current use of insulin  (HCC) - Plan: Bayer DCA Hb A1c Waived, insulin  glargine (LANTUS  SOLOSTAR) 100 UNIT/ML Solostar Pen  Hypertension associated with diabetes (HCC) - Plan: amLODipine  (NORVASC ) 2.5 MG tablet, lisinopril  (ZESTRIL ) 30 MG  tablet  Hyperlipidemia associated with type 2 diabetes mellitus (HCC) - Plan: atorvastatin  (LIPITOR ) 80 MG tablet  Diabetic polyneuropathy associated with type 2 diabetes mellitus (HCC)  Immunization due - Plan: Varicella-zoster vaccine IM, Flu vaccine HIGH DOSE PF(Fluzone Trivalent)  Anxiety - Plan: mirtazapine  (REMERON ) 30 MG tablet  Depressive disorder - Plan: mirtazapine  (REMERON ) 30 MG tablet  Check A1c.  Medications have been renewed.  Blood pressure controlled on dual therapy for age.  No changes  Polyneuropathy stable.  Influenza vaccination administered.  Anxiety and depression are not controlled and so I did go ahead and advance the mirtazapine .   Casey CHRISTELLA Fielding, DO [facility] [phone]

## 2024-08-09 DIAGNOSIS — M85852 Other specified disorders of bone density and structure, left thigh: Secondary | ICD-10-CM | POA: Diagnosis not present

## 2024-08-09 DIAGNOSIS — M85851 Other specified disorders of bone density and structure, right thigh: Secondary | ICD-10-CM | POA: Diagnosis not present

## 2024-08-09 DIAGNOSIS — Z780 Asymptomatic menopausal state: Secondary | ICD-10-CM | POA: Diagnosis not present

## 2024-08-10 ENCOUNTER — Ambulatory Visit: Payer: Self-pay | Admitting: Family Medicine

## 2024-08-15 ENCOUNTER — Telehealth: Payer: Self-pay | Admitting: Family Medicine

## 2024-08-15 NOTE — Telephone Encounter (Signed)
 Copied from CRM [id number]. Topic: Clinical - Medication Question >> Aug 15, 2024  3:30 PM Sophia H wrote: Reason for CRM: Patient states she was prescribed a new Freestyle Libre 3 back in September that she picked up. She received a notification from the pharmacy again today stating she had an rx ready for pick up - after picking up she realized it is another freestyle libre 3 meter. Wants to know if PCP sent another in by mistake since she had already picked up her new meter in September. I asked patient if she had checked with the pharmacy to see if maybe it was a mistake on their end, she states she would like clarification from clinic prior. # [phone]

## 2024-08-15 NOTE — Telephone Encounter (Signed)
 Clarification given. LS

## 2024-08-23 DIAGNOSIS — E113512 Type 2 diabetes mellitus with proliferative diabetic retinopathy with macular edema, left eye: Secondary | ICD-10-CM | POA: Diagnosis not present

## 2024-09-23 ENCOUNTER — Telehealth: Payer: Self-pay | Admitting: Pharmacy Technician

## 2024-09-23 ENCOUNTER — Other Ambulatory Visit (HOSPITAL_COMMUNITY): Payer: Self-pay

## 2024-09-23 NOTE — Telephone Encounter (Signed)
 Pharmacy Patient Advocate Encounter   Received notification from Onbase that prior authorization for FreeStyle Libre 3 Plus Sensor is due for renewal.   Insurance verification completed.   The patient is insured through [employer].

## 2024-10-07 ENCOUNTER — Other Ambulatory Visit: Payer: Self-pay | Admitting: Family Medicine

## 2024-10-07 DIAGNOSIS — Z1231 Encounter for screening mammogram for malignant neoplasm of breast: Secondary | ICD-10-CM

## 2024-10-10 ENCOUNTER — Inpatient Hospital Stay: Admission: RE | Admit: 2024-10-10 | Discharge: 2024-10-10 | Attending: Family Medicine

## 2024-10-10 DIAGNOSIS — Z1231 Encounter for screening mammogram for malignant neoplasm of breast: Secondary | ICD-10-CM

## 2024-10-31 ENCOUNTER — Ambulatory Visit: Payer: Medicare HMO

## 2024-10-31 VITALS — BP 149/75 | HR 82 | Ht 61.0 in | Wt 183.0 lb

## 2024-10-31 DIAGNOSIS — Z0000 Encounter for general adult medical examination without abnormal findings: Secondary | ICD-10-CM

## 2024-10-31 NOTE — Progress Notes (Signed)
 "   Chief Complaint  Patient presents with   Medicare Wellness     Subjective:   Casey Sanders is a 64 y.o. female who presents for a Medicare Annual Wellness Visit.  Visit info / Clinical Intake: Medicare Wellness Visit Type:: Subsequent Annual Wellness Visit Persons participating in visit and providing information:: patient Medicare Wellness Visit Mode:: Telephone If telephone:: video declined Since this visit was completed virtually, some vitals may be partially provided or unavailable. Missing vitals are due to the limitations of the virtual format.: Unable to obtain vitals - no equipment If Telephone or Video please confirm:: I connected with patient using audio/video enable telemedicine. I verified patient identity with two identifiers, discussed telehealth limitations, and patient agreed to proceed. Patient Location:: home Provider Location:: office Interpreter Needed?: No Pre-visit prep was completed: no AWV questionnaire completed by patient prior to visit?: no Living arrangements:: -- (with family members) Patient's Overall Health Status Rating: very good Typical amount of pain: none Does pain affect daily life?: no Are you currently prescribed opioids?: no  Dietary Habits and Nutritional Risks How many meals a day?: 3 Eats fruit and vegetables daily?: yes Most meals are obtained by: preparing own meals In the last 2 weeks, have you had any of the following?: (!) nausea, vomiting, diarrhea Diabetic:: (!) yes Any non-healing wounds?: no How often do you check your BS?: continuous glucose monitor Would you like to be referred to a Nutritionist or for Diabetic Management? : no  Functional Status Activities of Daily Living (to include ambulation/medication): Independent Ambulation: Independent Medication Administration: Independent Home Management (perform basic housework or laundry): Independent Manage your own finances?: yes (daughter helps) Primary transportation  is: family / friends Concerns about vision?: no *vision screening is required for [facility]* (last ov 2025/Dr. Nonah) Concerns about hearing?: no  Fall Screening Falls in the past year?: 0 Number of falls in past year: 0 Was there an injury with Fall?: 0 Fall Risk Category Calculator: 0 Patient Fall Risk Level: Low Fall Risk  Fall Risk Patient at Risk for Falls Due to: No Fall Risks Fall risk Follow up: Falls evaluation completed; Education provided  Home and Transportation Safety: All rugs have non-skid backing?: yes All stairs or steps have railings?: yes Grab bars in the bathtub or shower?: yes Have non-skid surface in bathtub or shower?: yes Good home lighting?: yes Regular seat belt use?: yes Hospital stays in the last year:: no  Cognitive Assessment Difficulty concentrating, remembering, or making decisions? : no Will 6CIT or Mini Cog be Completed: yes What year is it?: 0 points What month is it?: 0 points Give patient an address phrase to remember (5 components): [address] [place] About what time is it?: 0 points Count backwards from 20 to 1: 0 points Say the months of the year in reverse: 0 points Repeat the address phrase from earlier: 0 points 6 CIT Score: 0 points  Advance Directives (For Healthcare) Does Patient Have a Medical Advance Directive?: No Would patient like information on creating a medical advance directive?: No - Patient declined  Reviewed/Updated  Reviewed/Updated: Reviewed All (Medical, Surgical, Family, Medications, Allergies, Care Teams, Patient Goals); Medical History; Surgical History; Family History; Medications; Allergies; Care Teams; Patient Goals    Allergies (verified) Codeine, Empagliflozin , Metformin and related, and Niaspan [niacin er (antihyperlipidemic)]   Current Medications (verified) Outpatient Encounter Medications as of 10/31/2024  Medication Sig   Accu-Chek Softclix Lancets lancets Please use to test blood sugar  3 times daily as  directed. DX: E11.9   albuterol  (VENTOLIN  HFA) 108 (90 Base) MCG/ACT inhaler Inhale 1 puff into the lungs every 4 (four) hours as needed for wheezing or shortness of breath.   Alpha-Lipoic Acid 600 MG CAPS Take 1 capsule (600 mg total) by mouth daily. For nerve pain in foot   amLODipine  (NORVASC ) 2.5 MG tablet Take 1 tablet (2.5 mg total) by mouth daily. Add to lisinopril  for blood pressure   aspirin  81 MG EC tablet Take 81 mg by mouth daily.   atorvastatin  (LIPITOR ) 80 MG tablet Take 1 tablet (80 mg total) by mouth daily.   Blood Glucose Monitoring Suppl DEVI E11.69 Check BGs up to 4 times daily; May substitute to any manufacturer covered by patient's insurance.   ciclopirox  (PENLAC ) 8 % solution Apply topically at bedtime. Apply over nail and surrounding skin. Apply daily over previous coat. After seven (7) days, may remove with alcohol and continue cycle.   Continuous Glucose Receiver (FREESTYLE LIBRE READER) DEVI 1 Units by Does not apply route daily. UAD to test BGs daily. Dx E11.69   Continuous Glucose Sensor (FREESTYLE LIBRE 3 SENSOR) MISC PLACE 1 SENSOR ON THE SKIN EVERY 14 DAYS TO CHECK GLUCOSE CONTINUOUSLY   diclofenac  sodium (VOLTAREN ) 1 % GEL Apply 2 g topically 4 (four) times daily.   fish oil-omega-3 fatty acids 1000 MG capsule Take 2 g by mouth daily.   Glucose Blood (BLOOD GLUCOSE TEST STRIPS) STRP E11.69 Check BGs up to 4 times daily; May substitute to any manufacturer covered by patient's insurance.   insulin  glargine (LANTUS  SOLOSTAR) 100 UNIT/ML Solostar Pen Inject 20 Units into the skin daily. To replace semglee    Insulin  Pen Needle 32G X 6 MM MISC UAD with insulin  E11.9   Lancet Device MISC E11.69 Check BGs up to 4 times daily; May substitute to any manufacturer covered by patient's insurance.   Lancets Misc. MISC E11.69 Check BGs up to 4 times daily; May substitute to any manufacturer covered by patient's insurance.   lisinopril  (ZESTRIL ) 30 MG tablet Take 1  tablet (30 mg total) by mouth daily.   mirtazapine  (REMERON ) 30 MG tablet Take 1 tablet (30 mg total) by mouth at bedtime. **new dose   No facility-administered encounter medications on file as of 10/31/2024.    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 Myoview  9/05: no ischemia, no scar, EF 70%   Chest pain, unspecified 02/12/2011   Cystitis 02/08/2015   Diverticulosis    DM2 (diabetes mellitus, type 2) (HCC)    Extremity pain 01/04/2013   Fibromyalgia    Frequent UTI    GERD (gastroesophageal reflux disease)    Hiatal Hernia   HLD (hyperlipidemia)    HTN (hypertension)    Left rotator cuff tear    Low back pain 01/04/2013   Lumbar disc disease    Nephrolithiasis    Obesity    Palpitations    monitor 9/05: NSR, sinus tachy, PVCs   Pre-operative cardiovascular examination 02/12/2011   Rectal bleeding 08/22/2013   Sepsis secondary to UTI (HCC) 08/09/2017   uterine ca dx'd 1988   per pt   Past Surgical History:  Procedure Laterality Date   ABDOMINAL HYSTERECTOMY     CESAREAN SECTION     ESOPHAGEAL MANOMETRY N/A 06/19/2021   Procedure: ESOPHAGEAL  MANOMETRY (EM);  Surgeon: Dianna Specking, MD;  Location: [facility];  Service: Endoscopy;  Laterality: N/A;   FOOT SURGERY     right wrist surgery     ROTATOR CUFF REPAIR Left 2009   tonsillectomy     TOTAL ABDOMINAL HYSTERECTOMY W/ BILATERAL SALPINGOOPHORECTOMY     Family History  Problem Relation Age of Onset   Coronary artery disease Mother    Coronary artery disease Father    Heart disease Brother    Heart disease Brother    Lung cancer Brother 49   Heart attack Brother 6       had a few heart attacks before   Heart disease Brother    Breast cancer Neg Hx    Social History   Occupational History   Occupation: disabled  Tobacco Use   Smoking  status: Never    Passive exposure: Never   Smokeless tobacco: Never  Vaping Use   Vaping status: Never Used  Substance and Sexual Activity   Alcohol use: No   Drug use: No   Sexual activity: Not Currently    Partners: Male   Tobacco Counseling Counseling given: Yes  SDOH Screenings   Food Insecurity: No Food Insecurity (10/31/2024)  Housing: Unknown (10/31/2024)  Transportation Needs: No Transportation Needs (10/31/2024)  Utilities: Not At Risk (10/31/2024)  Alcohol Screen: Low Risk (10/30/2023)  Depression (PHQ2-9): Low Risk (10/31/2024)  Financial Resource Strain: Low Risk (10/30/2023)  Physical Activity: Inactive (10/31/2024)  Social Connections: Moderately Integrated (10/31/2024)  Stress: No Stress Concern Present (10/31/2024)  Tobacco Use: Low Risk (10/31/2024)  Health Literacy: Adequate Health Literacy (10/31/2024)   See flowsheets for full screening details  Depression Screen PHQ 2 & 9 Depression Scale- Over the past 2 weeks, how often have you been bothered by any of the following problems? Little interest or pleasure in doing things: 0 Feeling down, depressed, or hopeless (PHQ Adolescent also includes...irritable): 0 PHQ-2 Total Score: 0 Trouble falling or staying asleep, or sleeping too much: 0 Feeling tired or having little energy: 0 Poor appetite or overeating (PHQ Adolescent also includes...weight loss): 0 Feeling bad about yourself - or that you are a failure or have let yourself or your family down: 0 Trouble concentrating on things, such as reading the newspaper or watching television (PHQ Adolescent also includes...like school work): 0 Moving or speaking so slowly that other people could have noticed. Or the opposite - being so fidgety or restless that you have been moving around a lot more than usual: 0 Thoughts that you would be better off dead, or of hurting yourself in some way: 0 PHQ-9 Total Score: 0 If you checked off any problems, how difficult have these  problems made it for you to do your work, take care of things at home, or get along with other people?: Not difficult at all  Depression Treatment Depression Interventions/Treatment : Currently on Treatment     Goals Addressed             This Visit's Progress    Stay Active and Independent   On track            Objective:    Today's Vitals   10/31/24 0947  BP: (!) 149/75  Pulse: 82  Weight: 183 lb (83 kg)  Height: 5' 1 (1.549 m)   Body mass index is 34.58 kg/m.  Hearing/Vision screen No results found. Immunizations and Health Maintenance Health Maintenance  Topic Date Due   COVID-19 Vaccine (3 - Moderna risk  series) 05/24/2020   Zoster Vaccines- Shingrix  (2 of 2) 09/28/2024   Pneumococcal Vaccine: 50+ Years (3 of 3 - PCV20 or PCV21) 04/04/2025 (Originally 07/15/2021)   HEMOGLOBIN A1C  02/01/2025   OPHTHALMOLOGY EXAM  03/31/2025   Diabetic kidney evaluation - eGFR measurement  04/04/2025   Diabetic kidney evaluation - Urine ACR  04/04/2025   FOOT EXAM  04/04/2025   Fecal DNA (Cologuard)  07/25/2025   Medicare Annual Wellness (AWV)  10/31/2025   Bone Density Scan  08/09/2026   Mammogram  10/10/2026   Colonoscopy  04/18/2031   DTaP/Tdap/Td (4 - Td or Tdap) 04/30/2033   Influenza Vaccine  Completed   Hepatitis C Screening  Completed   HIV Screening  Completed   Hepatitis B Vaccines 19-59 Average Risk  Aged Out   Meningococcal B Vaccine  Aged Out        Assessment/Plan:  This is a routine wellness examination for Casey Sanders.  Patient Care Team: Jolinda Norene HERO, DO as PCP - General (Family Medicine) Billee Mliss BIRCH, RPH-CPP (Pharmacist) Elicia Claw, MD as Consulting Physician (Gastroenterology) [facility] Nonah, Camellia ORN, MD as Consulting Physician (Optometry)  I have personally reviewed and noted the following in the patients chart:   Medical and social history Use of alcohol, tobacco or illicit drugs  Current  medications and supplements including opioid prescriptions. Functional ability and status Nutritional status Physical activity Advanced directives List of other physicians Hospitalizations, surgeries, and ER visits in previous 12 months Vitals Screenings to include cognitive, depression, and falls Referrals and appointments  No orders of the defined types were placed in this encounter.  In addition, I have reviewed and discussed with patient certain preventive protocols, quality metrics, and best practice recommendations. A written personalized care plan for preventive services as well as general preventive health recommendations were provided to patient.   Ozie Ned, CMA   10/31/2024   Return in 1 year (on 10/31/2025).  After Visit Summary: (MyChart) Due to this being a telephonic visit, the after visit summary with patients personalized plan was offered to patient via MyChart   Nurse Notes: Pt is aware and due the following: Covid and shingles vaccine "

## 2024-10-31 NOTE — Patient Instructions (Signed)
 Casey Sanders,  Thank you for taking the time for your Medicare Wellness Visit. I appreciate your continued commitment to your health goals. Please review the care plan we discussed, and feel free to reach out if I can assist you further.  Please note that Annual Wellness Visits do not include a physical exam. Some assessments may be limited, especially if the visit was conducted virtually. If needed, we may recommend an in-person follow-up with your provider.  Ongoing Care Seeing your primary care provider every 3 to 6 months helps us  monitor your health and provide consistent, personalized care.   Referrals If a referral was made during today's visit and you haven't received any updates within two weeks, please contact the referred provider directly to check on the status.  Recommended Screenings:  Health Maintenance  Topic Date Due   COVID-19 Vaccine (3 - Moderna risk series) 05/24/2020   Zoster (Shingles) Vaccine (2 of 2) 09/28/2024   Medicare Annual Wellness Visit  10/29/2024   Pneumococcal Vaccine for age over 106 (3 of 3 - PCV20 or PCV21) 04/04/2025*   Hemoglobin A1C  02/01/2025   Eye exam for diabetics  03/31/2025   Yearly kidney function blood test for diabetes  04/04/2025   Yearly kidney health urinalysis for diabetes  04/04/2025   Complete foot exam   04/04/2025   Cologuard (Stool DNA test)  07/25/2025   Osteoporosis screening with Bone Density Scan  08/09/2026   Breast Cancer Screening  10/10/2026   Colon Cancer Screening  04/18/2031   DTaP/Tdap/Td vaccine (4 - Td or Tdap) 04/30/2033   Flu Shot  Completed   Hepatitis C Screening  Completed   HIV Screening  Completed   Hepatitis B Vaccine  Aged Out   Meningitis B Vaccine  Aged Out  *Topic was postponed. The date shown is not the original due date.       10/31/2024    9:48 AM  Advanced Directives  Does Patient Have a Medical Advance Directive? No  Would patient like information on creating a medical advance directive?  No - Patient declined    Vision: Annual vision screenings are recommended for early detection of glaucoma, cataracts, and diabetic retinopathy. These exams can also reveal signs of chronic conditions such as diabetes and high blood pressure.  Dental: Annual dental screenings help detect early signs of oral cancer, gum disease, and other conditions linked to overall health, including heart disease and diabetes.  Please see the attached documents for additional preventive care recommendations.

## 2025-04-03 ENCOUNTER — Encounter: Admitting: Family Medicine

## 2025-11-01 ENCOUNTER — Ambulatory Visit
